# Patient Record
Sex: Female | Born: 1935 | Race: White | Hispanic: No | Marital: Married | Smoking: Never smoker
Health system: Southern US, Community
[De-identification: ages and names within clinical notes are randomized; demographics above are authoritative.]

## PROBLEM LIST (undated history)

## (undated) DIAGNOSIS — F419 Anxiety disorder, unspecified: Secondary | ICD-10-CM

## (undated) DIAGNOSIS — I10 Essential (primary) hypertension: Secondary | ICD-10-CM

## (undated) DIAGNOSIS — C50919 Malignant neoplasm of unspecified site of unspecified female breast: Secondary | ICD-10-CM

## (undated) DIAGNOSIS — E079 Disorder of thyroid, unspecified: Secondary | ICD-10-CM

## (undated) DIAGNOSIS — R42 Dizziness and giddiness: Secondary | ICD-10-CM

## (undated) DIAGNOSIS — C4491 Basal cell carcinoma of skin, unspecified: Secondary | ICD-10-CM

## (undated) DIAGNOSIS — E785 Hyperlipidemia, unspecified: Secondary | ICD-10-CM

## (undated) DIAGNOSIS — G4700 Insomnia, unspecified: Secondary | ICD-10-CM

## (undated) DIAGNOSIS — K648 Other hemorrhoids: Secondary | ICD-10-CM

## (undated) DIAGNOSIS — E119 Type 2 diabetes mellitus without complications: Secondary | ICD-10-CM

## (undated) DIAGNOSIS — K529 Noninfective gastroenteritis and colitis, unspecified: Secondary | ICD-10-CM

## (undated) DIAGNOSIS — M1990 Unspecified osteoarthritis, unspecified site: Secondary | ICD-10-CM

## (undated) DIAGNOSIS — K5790 Diverticulosis of intestine, part unspecified, without perforation or abscess without bleeding: Secondary | ICD-10-CM

## (undated) DIAGNOSIS — D649 Anemia, unspecified: Secondary | ICD-10-CM

## (undated) DIAGNOSIS — M549 Dorsalgia, unspecified: Secondary | ICD-10-CM

## (undated) DIAGNOSIS — N390 Urinary tract infection, site not specified: Secondary | ICD-10-CM

## (undated) DIAGNOSIS — G8929 Other chronic pain: Secondary | ICD-10-CM

## (undated) DIAGNOSIS — K219 Gastro-esophageal reflux disease without esophagitis: Secondary | ICD-10-CM

## (undated) HISTORY — PX: CATARACT EXTRACTION, BILATERAL: SHX1313

## (undated) HISTORY — PX: TRIGGER FINGER RELEASE: SHX641

## (undated) HISTORY — DX: Other hemorrhoids: K64.8

## (undated) HISTORY — DX: Urinary tract infection, site not specified: N39.0

## (undated) HISTORY — DX: Anxiety disorder, unspecified: F41.9

## (undated) HISTORY — DX: Diverticulosis of intestine, part unspecified, without perforation or abscess without bleeding: K57.90

## (undated) HISTORY — PX: CHOLECYSTECTOMY: SHX55

## (undated) HISTORY — DX: Other chronic pain: G89.29

## (undated) HISTORY — PX: TONSILLECTOMY: SUR1361

## (undated) HISTORY — DX: Dorsalgia, unspecified: M54.9

## (undated) HISTORY — PX: CARDIAC CATHETERIZATION: SHX172

## (undated) HISTORY — PX: ABDOMINAL HYSTERECTOMY: SHX81

## (undated) HISTORY — DX: Unspecified osteoarthritis, unspecified site: M19.90

## (undated) HISTORY — DX: Anemia, unspecified: D64.9

## (undated) HISTORY — DX: Type 2 diabetes mellitus without complications: E11.9

## (undated) HISTORY — DX: Hyperlipidemia, unspecified: E78.5

## (undated) HISTORY — DX: Dizziness and giddiness: R42

## (undated) HISTORY — PX: SHOULDER SURGERY: SHX246

## (undated) HISTORY — DX: Essential (primary) hypertension: I10

## (undated) HISTORY — DX: Insomnia, unspecified: G47.00

## (undated) HISTORY — DX: Disorder of thyroid, unspecified: E07.9

## (undated) HISTORY — DX: Basal cell carcinoma of skin, unspecified: C44.91

## (undated) HISTORY — DX: Malignant neoplasm of unspecified site of unspecified female breast: C50.919

---

## 2004-09-29 DIAGNOSIS — C50919 Malignant neoplasm of unspecified site of unspecified female breast: Secondary | ICD-10-CM

## 2004-09-29 HISTORY — DX: Malignant neoplasm of unspecified site of unspecified female breast: C50.919

## 2005-09-29 HISTORY — PX: MASTECTOMY, RADICAL: SHX710

## 2005-09-29 LAB — HM DEXA SCAN: HM Dexa Scan: NORMAL

## 2007-02-02 ENCOUNTER — Ambulatory Visit: Payer: Self-pay | Admitting: Cardiology

## 2007-02-04 ENCOUNTER — Inpatient Hospital Stay (HOSPITAL_BASED_OUTPATIENT_CLINIC_OR_DEPARTMENT_OTHER): Admission: RE | Admit: 2007-02-04 | Discharge: 2007-02-04 | Payer: Self-pay | Admitting: Orthopedic Surgery

## 2007-02-04 ENCOUNTER — Ambulatory Visit: Payer: Self-pay | Admitting: Cardiology

## 2007-02-18 ENCOUNTER — Ambulatory Visit: Payer: Self-pay | Admitting: Cardiology

## 2011-02-11 NOTE — Cardiovascular Report (Signed)
[name] [name], [name] [name]                   ACCOUNT NO.:  [id number]   MEDICAL RECORD NO.:  [id number]          PATIENT TYPE:  OIB   LOCATION:  1966                         FACILITY:  [facility]   PHYSICIAN:  Arturo Morton. Riley Kill, MD, FACCDATE OF BIRTH:  Dec 07, 1935   DATE OF PROCEDURE:  02/04/2007  DATE OF DISCHARGE:                            CARDIAC CATHETERIZATION   INDICATIONS:  [name] is a pleasant 75 year old woman who has  discomfort in the very upper chest at the base the neck.  She does have  compression fractures of mid thoracic vertebral body of undetermined  age.  The current study was done because of her multiple risk factors  and strong family history.  She was referred for diagnostic  catheterization.   PROCEDURE:  1. Left heart catheterization.  2. Selective coronary arteriography.  3. Selective left ventriculography.   DESCRIPTION OF PROCEDURE:  The procedure was performed from the right  femoral artery using 4-French catheters.  She tolerated the procedure  without complication.  She was taken to the holding area in satisfactory  clinical condition.  I reviewed her angiographic studies with her  family.   HEMODYNAMIC DATA:  1. Central aortic pressure 147/70.  2. Left ventricular pressure 156/10.  3. No gradient on pullback across the aortic valve.   ANGIOGRAPHIC DATA:  1. The left main is free of critical disease.  2. The left anterior descending artery courses to the apex.  It      provides a major diagonal branch.  The diagonal branch is large and      bifurcating.  There is minor luminal irregularity in the diagonal      branch but no significant focal stenosis.  Just after the takeoff      of the diagonal branch there is a segmental area of mild plaquing      that would measure about 20-30% in luminal reduction in the LAD.      This does not appear to be hemodynamically significant.  3. The circumflex provides a bifurcating marginal branch which is more      of an  intermediate vessel.  The AV circumflex is relatively small      and provides a tiny distal marginal vessel.  4. The right coronary artery demonstrates perhaps 20% ostial tapering      but the RCA is otherwise smooth.  It provides a twin posterior      descending branch and a moderate size posterolateral branch.   Ventriculography in the RAO projection reveals vigorous global systolic  function without segmental wall motion abnormality.   CONCLUSIONS:  1. Normal overall left ventricular systolic function.  2. Mild ostial narrowing of the right coronary artery.  3. Mild irregularity of the mid left anterior descending artery.   DISPOSITION:  The patient will follow up Dr. Andee Lineman and Dr. Dimas Aguas in  [place].  At the present time no further therapy other than risk factor  reduction is indicated for her coronary arteries.      Arturo Morton. Riley Kill, MD, [facility]     TDS/MEDQ  D:  02/04/2007  T:  02/04/2007  Job:  [id number]   cc:   Marcellina Millin, MD,FACC  [facility]

## 2011-02-11 NOTE — Assessment & Plan Note (Signed)
[facility]                          [facility]   [name] [name], [name] [name]                            MRN:          [id number]  DATE:02/18/2007                            DOB:          November 30, 1935    REFERRING PHYSICIAN:  Donzetta Sprung   HISTORY OF PRESENT ILLNESS:  The patient is a 75 year old, white female  with multiple current risk factors. The patient was referred for cardiac  catheterization due to complaints of chest pain. Catheterization  performed on Feb 04, 2007 revealed nonobstructive coronary artery  disease. There was some mild ostial narrowing of the right coronary  artery. There was also mild irregularities noted in the mid LAD. In the  interim, the patient also had carotid Dopplers done which essentially  were within normal limits. The patient states she has been doing well.  She has had no recurrent substernal chest pain. She does have ongoing  hypertension but she stated that she did not think that she took her  medications this morning.   MEDICATIONS:  1. Glucophage 500 b.i.d.  2. Labetalol 100 mg p.o. b.i.d.  3. Cymbalta 6 mg p.o. daily.  4. Hydrochlorothiazide 25 mg p.o. daily.  5. Glipizide ER 110 mg p.o. daily.  6. Enalapril 20 mg p.o. daily.  7. Lipitor 40 mg 1/2-tablet p.o. daily.  8. Byetta 10 mg p.o. b.i.d.   PHYSICAL EXAMINATION:  VITAL SIGNS:  Blood pressure 165/91, heart rate  98 beats per minute, he weighs 170 pounds.  NECK:  Normal carotid upstroke with right carotid bruit.  LUNGS:  Clear.  HEART:  Regular rate and rhythm. Normal S1, S2.  ABDOMEN:  Soft.  EXTREMITIES:  No cyanosis, clubbing or edema.   A 12-lead EKG normal sinus rhythm. Q waves in the inferior leads left  axis deviation. No acute ischemic changes.   PROBLEM LIST:  1. Chest discomfort.      a.     Nonobstructive coronary artery disease.      b.     Atypical features.  2. Multiple cardiac risk factors.      a.     Hypertension.  b.     Diabetes mellitus.      c.     Hyperlipidemia.      d.     Family history.  3. Right carotid bruit with essentially negative carotid Doppler.   PLAN:  1. The patient is doing well from a cardiovascular perspective. She      needs to have ongoing risk factor modification. This can be further      monitored by Dr. Reuel Boom.  2. I pointed out to the patient that her blood pressure is not      controlled but she assured me that she did not take her medications      this morning. I have asked her also to followup closely with Dr.      Reuel Boom on this.  3. At this point in time, no further cardiovascular workup is      required.     Learta Codding, MD,FACC  Electronically Signed    GED/MedQ  DD: 02/19/2007  DT: 02/19/2007  Job #: [id number]   cc:   Donzetta Sprung

## 2011-02-14 NOTE — Assessment & Plan Note (Signed)
[facility]                          [facility]   BLAKLEY, MICHNA                            MRN:          [id number]  DATE:02/02/2007                            DOB:          01-27-1936    REFERRING PHYSICIAN:  Donzetta Sprung   PRIMARY CARE PHYSICIAN:  Dr. Selinda Flavin.   REASON FOR CONSULTATION:  [name] [name] is a very pleasant 75 year old female  with no prior cardiac history but with numerous cardiac risk factors,  now referred to Dr. Lewayne Bunting today, as an add-on patient for  evaluation of persistent neck discomfort, worrisome for anginal  equivalent.   Patient's cardiac risk factors are notable for hypertension, type 2  diabetes mellitus, hyperlipidemia, family history, and age.  She has  never smoked tobacco.  The patient presents with a several month history  of persistent neck discomfort described as a burning sensation and  oftentimes associated with a knot.  This is nearly always precipitated  by food and at times by swallowing liquids and then persists for the  remainder of the day.  It is unclear, however, if this is exacerbated by  activities (i.e., walking, household chores).  She has tried Prilosec  OTC, Tums, and Rolaids with some initial relief but with subsequent  recurrence of the symptoms, which would remain unabated throughout the  day.  In fact, she oftentimes has to sleep sitting up because of the  persistent discomfort when lying supine.   Patient states that she has never undergone a formal GI evaluation nor  has had an upper endoscopy.   The patient's symptoms are confined only to the base of the neck.  She  denies any anterior chest discomfort and also denies any radiation of  this discomfort to the jaw or upper extremities.  She also denies any  associated dyspnea, diaphoresis, or nausea/vomiting.   An electrocardiogram done earlier today in Dr. Rosann Auerbach office reveals  NSR at 82 beats per minute with  left axis deviation and no ischemic  changes.  This is unchanged from a previous study of 2005.   ALLERGIES:  No known drug allergies.   CURRENT MEDICATIONS:  1. Glucophage 500 q.i.d.  2. Labetalol 100 daily.  3. Cymbalta 60 nightly.  4. Hydrochlorothiazide 25 daily.  5. Glipizide ER 20 daily.  6. Lipitor 20 daily.  7. Byetta 10 b.i.d.  8. Enalapril 20 daily.  9. Calcium/vitamin D 600 daily.  10.Aspirin 81 daily.   PAST MEDICAL HISTORY:  1. Hypertension.  2. Type 2 diabetes mellitus.  3. Hyperlipidemia.  4. Obesity.   PAST SURGICAL HISTORY:  Status post cholecystectomy, hysterectomy, and  trigger finger surgery.   SOCIAL HISTORY:  Patient is married, has three children, and seven  grandchildren.  She has never smoked tobacco and denies alcohol use.   FAMILY HISTORY:  Father deceased at age 42, fatal myocardial infarction.  Mother deceased at age 61, fatal myocardial infarction.  Sister, age 49,  status post MI in her 23s.   REVIEW OF SYSTEMS:  Denies history of myocardial infarction or  congestive  heart failure, otherwise as noted per HPI.  The remaining  systems are negative.   PHYSICAL EXAMINATION:  VITAL SIGNS:  Blood pressure 162/80, pulse 60s  and regular.  GENERAL:  A 75 year old female, morbidly obese, sitting upright in no  distress.  HEENT:  Normocephalic and atraumatic.  NECK:  Palpable bilateral carotid pulses with high-pitched right carotid  bruit; no bruit on the left.  Unable to assess JVD secondary to neck  girth.  LUNGS:  Clear to auscultation in all fields.  HEART:  Regular rate and rhythm (S1 and S2).  No significant murmurs.  No rubs.  ABDOMEN:  Protuberant, nontender.  EXTREMITIES:  Palpable femoral pulses without bruits.  Minimally  palpable dorsalis pedis pulses with trace edema.  NEURO:  No focal deficits.   IMPRESSION:  1. Neck discomfort.      a.     Question anginal equivalent.  2. Multiple cardiac risk factors.      a.      Hypertension.      b.     Type 2 diabetes mellitus.      c.     Hyperlipidemia.      d.     Family history.      e.     Age.      f.     Right carotid bruit.   PLAN:  1. Recommend proceeding with diagnostic coronary angiography, to be      scheduled in our [facility] catheterization lab, for definitive exclusion      of significant underlying coronary artery disease as the etiology      for her possible anginal equivalent.  We will try to schedule this      within the next 1-2 days.  The risks/benefits of the procedure have      been discussed with the patient, who is agreeable to proceed, and      Dr. Andee Lineman concurs with plan.  2. Continue current medication regimen, which includes low dose      aspirin, and add p.r.n. nitroglycerin.  3. Schedule carotid Dopplers for further assessment of right carotid      bruit.      Gene Serpe, PA-C       Learta Codding, MD,FACC    GS/MedQ  DD: 02/02/2007  DT: 02/02/2007  Job #: [id number]                             [facility]                          [facility] NOTE   NAME:[name], [name]                            MRN:          [id number]  DATE:02/02/2007                            DOB:          1936-05-12    REFERRING PHYSICIAN:  Donzetta Sprung   PRIMARY CARE PHYSICIAN:  Dr. Selinda Flavin.   REASON FOR CONSULTATION:  [name] [name] is a very pleasant 75 year old female  with no prior cardiac history but with numerous cardiac risk factors,  now referred to Dr. Lewayne Bunting as an add-on patient for evaluation of  persistent neck discomfort, worrisome for anginal equivalent.   Patient's cardiac risk factors are notable for hypertension, type 2  diabetes mellitus, hyperlipidemia, family history, and age.  She has  never smoked tobacco.  The patient presents with a several month history  of persistent neck discomfort described as a burning sensation and  oftentimes associated with a knot.  This is nearly always precipitated  by food  and at times by swallowing liquids and then persists for the  remainder of the day.  It is unclear, however, if this is exacerbated by  activities (i.e., walking, household chores).  She has tried Prilosec  OTC, Tums, and Rolaids with some initial relief but with subsequent  recurrence of the symptoms, which would remain unabated throughout the  day.  In fact, she oftentimes has to sleep sitting up because of the  persistent discomfort when lying supine.   Patient states that she has never undergone a formal GI evaluation nor  has had an upper endoscopy.   The patient's symptoms are confined only to the base of the neck.  She  denies any anterior chest discomfort and also denies any radiation of  this discomfort to the jaw or upper extremities.  She also denies any  associated dyspnea, diaphoresis, or nausea/vomiting.   An electrocardiogram done earlier today in Dr. Rosann Auerbach office reveals  NSR at 82 beats per minute with left axis deviation and no ischemic  changes.  This is unchanged from a previous study of 2005.   ALLERGIES:  No known drug allergies.   CURRENT MEDICATIONS:  1. Glucophage 500 q.i.d.  2. Labetalol 100 daily.  3. Cymbalta 60 nightly.  4. Hydrochlorothiazide 25 daily.  5. Glipizide ER 20 daily.  6. Lipitor 20 daily.  7. Byetta 10 b.i.d.  8. Enalapril 20 daily.  9. Calcium/vitamin D 600 daily.  10.Aspirin 81 daily.   PAST MEDICAL HISTORY:  1. Hypertension.  2. Type 2 diabetes mellitus.  3. Hyperlipidemia.

## 2011-02-28 ENCOUNTER — Ambulatory Visit: Payer: Self-pay | Admitting: Internal Medicine

## 2011-05-18 LAB — HM MAMMOGRAPHY: HM Mammogram: NORMAL

## 2011-07-02 ENCOUNTER — Ambulatory Visit (INDEPENDENT_AMBULATORY_CARE_PROVIDER_SITE_OTHER): Payer: Medicare Other | Admitting: *Deleted

## 2011-07-02 DIAGNOSIS — Z23 Encounter for immunization: Secondary | ICD-10-CM

## 2011-09-03 ENCOUNTER — Encounter: Payer: Self-pay | Admitting: Internal Medicine

## 2011-09-03 ENCOUNTER — Ambulatory Visit (INDEPENDENT_AMBULATORY_CARE_PROVIDER_SITE_OTHER): Payer: Medicare Other | Admitting: Internal Medicine

## 2011-09-03 VITALS — BP 138/72 | HR 93 | Temp 97.8°F | Wt 162.0 lb

## 2011-09-03 DIAGNOSIS — N390 Urinary tract infection, site not specified: Secondary | ICD-10-CM

## 2011-09-03 DIAGNOSIS — G629 Polyneuropathy, unspecified: Secondary | ICD-10-CM | POA: Insufficient documentation

## 2011-09-03 DIAGNOSIS — G589 Mononeuropathy, unspecified: Secondary | ICD-10-CM

## 2011-09-03 DIAGNOSIS — M25551 Pain in right hip: Secondary | ICD-10-CM | POA: Insufficient documentation

## 2011-09-03 DIAGNOSIS — I10 Essential (primary) hypertension: Secondary | ICD-10-CM

## 2011-09-03 DIAGNOSIS — M25559 Pain in unspecified hip: Secondary | ICD-10-CM

## 2011-09-03 LAB — COMPREHENSIVE METABOLIC PANEL
Albumin: 4 g/dL (ref 3.5–5.2)
Alkaline Phosphatase: 83 U/L (ref 39–117)
BUN: 9 mg/dL (ref 6–23)
CO2: 28 mEq/L (ref 19–32)
GFR: 103.99 mL/min (ref >60.00)
Glucose, Bld: 100 mg/dL — ABNORMAL HIGH (ref 70–99)
Potassium: 3.7 mEq/L (ref 3.5–5.1)
Sodium: 136 mEq/L (ref 135–145)
Total Bilirubin: 0.6 mg/dL (ref 0.3–1.2)
Total Protein: 7.6 g/dL (ref 6.0–8.3)

## 2011-09-03 LAB — POCT URINALYSIS DIPSTICK
Ketones, UA: NEGATIVE
Leukocytes, UA: NEGATIVE
Nitrite, UA: NEGATIVE
Protein, UA: NEGATIVE
pH, UA: 7

## 2011-09-03 MED ORDER — MELOXICAM 15 MG PO TABS: 15.0000 mg | ORAL_TABLET | Freq: Every day | ORAL | Status: AC

## 2011-09-03 NOTE — Progress Notes (Signed)
Subjective:    Patient ID: Jillian Lynch, female    DOB: 08-01-1936, 75 y.o.   MRN: [id number]  HPI 75 year old female with a history of breast cancer status post chemotherapy, hypertension, and hyperlipidemia presents for followup. She reports that she is generally been doing well. She notes some burning in both of her feet which has gotten progressively worse over the last several years. She occasionally has some numbness and burning in her hands. She also occasionally has some weakness in her hands and will drop things.This is been attributed to side effects from the chemotherapy that she received. She has been taking Neurontin twice daily for this but she is unsure of her dose. She reports minimal improvement with this.   She has not been checking her blood pressure on a regular basis. However, she reports full compliance with her medications. She denies any chest pain, palpitations, or headache.  She has a chronic history of right hip pain. This was evaluated with plain film earlier this year showing some osteoarthritis. She took meloxicam initially with some improvement but has not taken this recently. She denies any weakness in her right leg. She denies any swelling in the joint. She denies any fever or chills.  Outpatient Encounter Prescriptions as of 09/03/2011  Medication Sig Dispense Refill  . meloxicam (MOBIC) 15 MG tablet Take 1 tablet (15 mg total) by mouth daily.  30 tablet  2  NOTE MEDICATION LIST NOT UP TO DATE, awaiting pt old records and list of meds.  Review of Systems  Constitutional: Negative for fever, chills, appetite change, fatigue and unexpected weight change.  HENT: Negative for ear pain, congestion, sore throat, trouble swallowing, neck pain, voice change and sinus pressure.   Eyes: Negative for visual disturbance.  Respiratory: Negative for cough, shortness of breath, wheezing and stridor.   Cardiovascular: Negative for chest pain, palpitations and leg swelling.    Gastrointestinal: Negative for nausea, vomiting, abdominal pain, diarrhea, constipation, blood in stool, abdominal distention and anal bleeding.  Genitourinary: Negative for dysuria and flank pain.  Musculoskeletal: Negative for myalgias, arthralgias and gait problem.  Skin: Negative for color change and rash.  Neurological: Positive for weakness and numbness (and burning bilateral lower extremities and hands). Negative for dizziness and headaches.  Hematological: Negative for adenopathy. Does not bruise/bleed easily.  Psychiatric/Behavioral: Negative for suicidal ideas, sleep disturbance and dysphoric mood. The patient is not nervous/anxious.    BP 138/72  Pulse 93  Temp(Src) 97.8 F (36.6 C) (Oral)  Wt 162 lb (73.483 kg)  SpO2 98%     Objective:   Physical Exam  Constitutional: She is oriented to person, place, and time. She appears well-developed and well-nourished. No distress.  HENT:  Head: Normocephalic and atraumatic.  Right Ear: External ear normal.  Left Ear: External ear normal.  Nose: Nose normal.  Mouth/Throat: Oropharynx is clear and moist. No oropharyngeal exudate.  Eyes: Conjunctivae are normal. Pupils are equal, round, and reactive to light. Right eye exhibits no discharge. Left eye exhibits no discharge. No scleral icterus.  Neck: Normal range of motion. Neck supple. No tracheal deviation present. No thyromegaly present.  Cardiovascular: Normal rate, regular rhythm, normal heart sounds and intact distal pulses.  Exam reveals no gallop and no friction rub.   No murmur heard. Pulmonary/Chest: Effort normal and breath sounds normal. No respiratory distress. She has no wheezes. She has no rales. She exhibits no tenderness.  Musculoskeletal: Normal range of motion. She exhibits no edema and no tenderness.  Lymphadenopathy:  She has no cervical adenopathy.  Neurological: She is alert and oriented to person, place, and time. No cranial nerve deficit. She exhibits normal  muscle tone. Coordination normal.       Monofilament normal bilateral feet x3 points  Skin: Skin is warm and dry. No rash noted. She is not diaphoretic. No erythema. No pallor.  Psychiatric: She has a normal mood and affect. Her behavior is normal. Judgment and thought content normal.          Assessment & Plan:  1. Neuropathy - will try increasing dose of Neurontin. She will call or return to clinic if symptoms are not improving. Otherwise, return to clinic in 6 months.  2. Hypertension - blood pressure has been well controlled on current medications. Patient needs to bring an updated medication list with her to clinic so that we can reconcile medicines. Will check renal function with labs today. Followup in 6 months.  3. Right hip pain - Secondary to osteoarthritis. Will continue meloxicam prn.

## 2011-09-05 ENCOUNTER — Telehealth: Payer: Self-pay | Admitting: *Deleted

## 2011-09-05 MED ORDER — ENALAPRIL MALEATE 10 MG PO TABS: 10.0000 mg | ORAL_TABLET | Freq: Every day | ORAL | Status: DC

## 2011-09-05 MED ORDER — AMITRIPTYLINE HCL 50 MG PO TABS: 50.0000 mg | ORAL_TABLET | Freq: Every day | ORAL | Status: DC

## 2011-09-05 MED ORDER — ALPRAZOLAM 0.5 MG PO TABS: 0.5000 mg | ORAL_TABLET | Freq: Three times a day (TID) | ORAL | Status: DC | PRN

## 2011-09-05 MED ORDER — ATORVASTATIN CALCIUM 20 MG PO TABS: 20.0000 mg | ORAL_TABLET | Freq: Every day | ORAL | Status: DC

## 2011-09-05 NOTE — Telephone Encounter (Signed)
Meds updated and Rf's called in

## 2011-09-24 ENCOUNTER — Encounter: Payer: Self-pay | Admitting: Internal Medicine

## 2011-09-25 ENCOUNTER — Encounter: Payer: Self-pay | Admitting: Internal Medicine

## 2011-10-13 ENCOUNTER — Telehealth: Payer: Self-pay | Admitting: Internal Medicine

## 2011-10-13 NOTE — Telephone Encounter (Signed)
Patient fell in shower hit area between thigh and butt and is stiff in that area want to know what to do.

## 2011-10-13 NOTE — Telephone Encounter (Signed)
Patient fell in shower and hit the area between her thigh and butt. She is complaining of stiffness in that area. Wants to know what should she do?

## 2011-10-13 NOTE — Telephone Encounter (Signed)
error 

## 2011-10-13 NOTE — Telephone Encounter (Signed)
I spoke w/pt - she slipped and landed on her left thigh & buttock. She reports that she can bear weight but "feels that the leg wants to give out" at times. Scheduled for OV tomorrow at 8 am for eval.

## 2011-10-14 ENCOUNTER — Encounter: Payer: Self-pay | Admitting: Internal Medicine

## 2011-10-14 ENCOUNTER — Ambulatory Visit (INDEPENDENT_AMBULATORY_CARE_PROVIDER_SITE_OTHER): Payer: Medicare Other | Admitting: Internal Medicine

## 2011-10-14 ENCOUNTER — Ambulatory Visit (INDEPENDENT_AMBULATORY_CARE_PROVIDER_SITE_OTHER)
Admission: RE | Admit: 2011-10-14 | Discharge: 2011-10-14 | Disposition: A | Discharge status: Home / Self Care | Payer: Medicare Other | Source: Ambulatory Visit | Attending: Internal Medicine | Admitting: Internal Medicine

## 2011-10-14 VITALS — BP 138/70 | HR 125 | Temp 98.3°F | Ht 60.0 in | Wt 165.0 lb

## 2011-10-14 DIAGNOSIS — M25552 Pain in left hip: Secondary | ICD-10-CM

## 2011-10-14 DIAGNOSIS — M25559 Pain in unspecified hip: Secondary | ICD-10-CM

## 2011-10-14 NOTE — Progress Notes (Signed)
Subjective:    Patient ID: Jillian Lynch, female    DOB: 02-19-1936, 76 y.o.   MRN: [id number]  HPI 76YO female with h/o HL and HTN presents for acute visit after fall at home while stepping out of shower yesterday.  She notes she slipped on wet floor and fell to ground hitting left hip on grate between shower and tile floor.  She denies any head injury, LOC during event. She was immediately able to bear weight. She notes pain and swelling in her left hip and buttock. She reports that her left leg occasionally feels that it will give way. She denies any abdominal pain, loss of continence of bowel or bladder.  Outpatient Encounter Prescriptions as of 10/14/2011  Medication Sig Dispense Refill  . ALPRAZolam (XANAX) 0.5 MG tablet Take 1 tablet (0.5 mg total) by mouth 3 (three) times daily as needed.  90 tablet  0  . amitriptyline (ELAVIL) 50 MG tablet Take 1 tablet (50 mg total) by mouth at bedtime.  90 tablet  1  . aspirin EC 81 MG tablet Take 81 mg by mouth daily.        Marland Kitchen atorvastatin (LIPITOR) 20 MG tablet Take 1 tablet (20 mg total) by mouth daily.  90 tablet  1  . Calcium Carbonate-Vitamin D (CALCIUM + D PO) Take by mouth 2 (two) times daily.        . enalapril (VASOTEC) 10 MG tablet Take 1 tablet (10 mg total) by mouth daily.  90 tablet  1  . fish oil-omega-3 fatty acids 1000 MG capsule Take 2 g by mouth daily.        . fluticasone (FLONASE) 50 MCG/ACT nasal spray Place 2 sprays into the nose daily.        Marland Kitchen gabapentin (NEURONTIN) 100 MG capsule Take 100 mg by mouth 2 (two) times daily.        Marland Kitchen letrozole (FEMARA) 2.5 MG tablet Take 2.5 mg by mouth daily.        Marland Kitchen levothyroxine (SYNTHROID, LEVOTHROID) 75 MCG tablet Take 75 mcg by mouth daily.        . Magnesium 250 MG TABS Take by mouth daily.        . meloxicam (MOBIC) 15 MG tablet Take 1 tablet (15 mg total) by mouth daily.  30 tablet  2  . mometasone (NASONEX) 50 MCG/ACT nasal spray Place 2 sprays into the nose daily.        Marland Kitchen omeprazole  (PRILOSEC) 20 MG capsule Take 20 mg by mouth daily.          Review of Systems  Constitutional: Negative for fever and chills.  Respiratory: Negative for shortness of breath.   Cardiovascular: Negative for chest pain and leg swelling.  Gastrointestinal: Negative for abdominal pain.  Musculoskeletal: Positive for myalgias and arthralgias.  Neurological: Negative for weakness and light-headedness.   BP 138/70  Pulse 125  Temp(Src) 98.3 F (36.8 C) (Oral)  Ht 5' (1.524 m)  Wt 165 lb (74.844 kg)  BMI 32.22 kg/m2  SpO2 97%     Objective:   Physical Exam  Constitutional: She appears well-developed and well-nourished. No distress.  HENT:  Head: Normocephalic and atraumatic.  Musculoskeletal: She exhibits edema and tenderness.       Left hip: She exhibits tenderness and swelling.       Legs: Skin: She is not diaphoretic.          Assessment & Plan:  1. Left hip pain - s/p fall.  Extensive bruising/swelling posterior left hip and buttock. Able to bear weight and full ROM left hip. Will get plain film pelvis to look for fracture, however low index of suspicion for this. Pt will use meloxicam as needed for pain and call if symptoms worsening.

## 2011-10-16 ENCOUNTER — Telehealth: Payer: Self-pay | Admitting: Internal Medicine

## 2011-10-16 MED ORDER — CYCLOBENZAPRINE HCL 5 MG PO TABS: 5.0000 mg | ORAL_TABLET | Freq: Three times a day (TID) | ORAL | Status: AC | PRN

## 2011-10-16 NOTE — Telephone Encounter (Signed)
Patient informed, RX sent in  

## 2011-10-16 NOTE — Telephone Encounter (Signed)
Patient is needing a muscle relaxer from where she fell in the shower. Leave a message if patient is not home.

## 2011-10-16 NOTE — Telephone Encounter (Signed)
Fine to call in Flexeril 5mg  po tid prn disp 30 no refill

## 2011-10-24 ENCOUNTER — Ambulatory Visit (INDEPENDENT_AMBULATORY_CARE_PROVIDER_SITE_OTHER): Payer: Medicare Other | Admitting: Internal Medicine

## 2011-10-24 ENCOUNTER — Encounter: Payer: Self-pay | Admitting: Internal Medicine

## 2011-10-24 ENCOUNTER — Ambulatory Visit: Payer: Self-pay | Admitting: Internal Medicine

## 2011-10-24 VITALS — BP 138/72 | HR 99 | Temp 97.7°F | Wt 164.0 lb

## 2011-10-24 DIAGNOSIS — S300XXA Contusion of lower back and pelvis, initial encounter: Secondary | ICD-10-CM

## 2011-10-24 DIAGNOSIS — R609 Edema, unspecified: Secondary | ICD-10-CM

## 2011-10-24 DIAGNOSIS — R269 Unspecified abnormalities of gait and mobility: Secondary | ICD-10-CM | POA: Insufficient documentation

## 2011-10-24 DIAGNOSIS — R600 Localized edema: Secondary | ICD-10-CM

## 2011-10-24 LAB — CBC WITH DIFFERENTIAL/PLATELET
Basophils Absolute: 0 10*3/uL (ref 0.0–0.1)
Eosinophils Relative: 1 % (ref 0–5)
HCT: 33.6 % — ABNORMAL LOW (ref 36.0–46.0)
Hemoglobin: 10.6 g/dL — ABNORMAL LOW (ref 12.0–15.0)
Lymphocytes Relative: 10 % — ABNORMAL LOW (ref 12–46)
MCV: 96.6 fL (ref 78.0–100.0)
Monocytes Absolute: 1 10*3/uL (ref 0.1–1.0)
Monocytes Relative: 8 % (ref 3–12)
Neutro Abs: 10.9 10*3/uL — ABNORMAL HIGH (ref 1.7–7.7)
RDW: 16.4 % — ABNORMAL HIGH (ref 11.5–15.5)
WBC: 13.4 10*3/uL — ABNORMAL HIGH (ref 4.0–10.5)

## 2011-10-24 LAB — COMPREHENSIVE METABOLIC PANEL
ALT: 37 U/L — ABNORMAL HIGH (ref 0–35)
AST: 36 U/L (ref 0–37)
BUN: 13 mg/dL (ref 6–23)
CO2: 25 mEq/L (ref 19–32)
Creat: 0.62 mg/dL (ref 0.50–1.10)
Total Bilirubin: 0.6 mg/dL (ref 0.3–1.2)

## 2011-10-24 LAB — PROTIME-INR
INR: 0.97 (ref <1.50)
Prothrombin Time: 13.3 seconds (ref 11.6–15.2)

## 2011-10-24 NOTE — Assessment & Plan Note (Signed)
Lower extremity edema secondary to extensive ecchymosis after fall. Given that edema has been persistent and she has some left calf pain will get doppler of the lower extremities for evaluation for DVT.

## 2011-10-24 NOTE — Progress Notes (Signed)
Subjective:    Patient ID: Jillian Lynch, female    DOB: [birth year], 75 y.o.   MRN: [id number]  HPI 76 year old female with history of hypertension presents for followup after recent fall in 10/13/2011. She was in her shower and fell backwards onto the threshold between her shower in her bathroom. She landed on her left hip. She was seen in the office on 10/14/2011 and exam was remarkable for extensive swelling in the area. Plain x-ray was normal. She reports that over the last week, she has developed extensive bruising in the area which extends down her left leg. She has also had some edema in her lower leg and pain in her left calf. She reports that she has been trying to keep her leg elevated with some improvement in the edema. She has not had any shortness of breath. She has not been sedentary. She notes that she has tried to be active and participate in her normal activities.  She is also concerned today after having this recent fall about some gait instability. She notes that her balance is poor. She has on occasion used a cane or walker. She does not have any focal numbness or weakness in her lower extremities. She does have chronic pain in her right hip. This is secondary to arthritis. She is interested in starting physical therapy to help with strength and balance training.  Outpatient Encounter Prescriptions as of 10/24/2011  Medication Sig Dispense Refill  . ALPRAZolam (XANAX) 0.5 MG tablet Take 1 tablet (0.5 mg total) by mouth 3 (three) times daily as needed.  90 tablet  0  . amitriptyline (ELAVIL) 50 MG tablet Take 1 tablet (50 mg total) by mouth at bedtime.  90 tablet  1  . aspirin EC 81 MG tablet Take 81 mg by mouth daily.        Marland Kitchen atorvastatin (LIPITOR) 20 MG tablet Take 1 tablet (20 mg total) by mouth daily.  90 tablet  1  . Calcium Carbonate-Vitamin D (CALCIUM + D PO) Take by mouth 2 (two) times daily.        . cyclobenzaprine (FLEXERIL) 5 MG tablet Take 1 tablet (5 mg total) by mouth 3  (three) times daily as needed for muscle spasms.  30 tablet  0  . enalapril (VASOTEC) 10 MG tablet Take 1 tablet (10 mg total) by mouth daily.  90 tablet  1  . fish oil-omega-3 fatty acids 1000 MG capsule Take 2 g by mouth daily.        . fluticasone (FLONASE) 50 MCG/ACT nasal spray Place 2 sprays into the nose daily.        Marland Kitchen gabapentin (NEURONTIN) 100 MG capsule Take 100 mg by mouth 2 (two) times daily.        Marland Kitchen letrozole (FEMARA) 2.5 MG tablet Take 2.5 mg by mouth daily.        Marland Kitchen levothyroxine (SYNTHROID, LEVOTHROID) 75 MCG tablet Take 75 mcg by mouth daily.        . Magnesium 250 MG TABS Take by mouth daily.        . meloxicam (MOBIC) 15 MG tablet Take 1 tablet (15 mg total) by mouth daily.  30 tablet  2  . mometasone (NASONEX) 50 MCG/ACT nasal spray Place 2 sprays into the nose daily.        Marland Kitchen omeprazole (PRILOSEC) 20 MG capsule Take 20 mg by mouth daily.          Review of Systems  Constitutional: Negative for fever,  chills and fatigue.  Respiratory: Negative for shortness of breath.   Cardiovascular: Positive for leg swelling. Negative for chest pain.  Musculoskeletal: Positive for myalgias, arthralgias and gait problem.  Skin: Positive for color change.  Neurological: Positive for weakness. Negative for numbness.   BP 138/72  Pulse 99  Temp(Src) 97.7 F (36.5 C) (Oral)  Wt 164 lb (74.39 kg)  SpO2 96%     Objective:   Physical Exam  Constitutional: She is oriented to person, place, and time. She appears well-developed and well-nourished. No distress.  HENT:  Head: Normocephalic and atraumatic.  Right Ear: External ear normal.  Left Ear: External ear normal.  Nose: Nose normal.  Mouth/Throat: Oropharynx is clear and moist. No oropharyngeal exudate.  Eyes: Conjunctivae are normal. Pupils are equal, round, and reactive to light. Right eye exhibits no discharge. Left eye exhibits no discharge. No scleral icterus.  Neck: Normal range of motion. Neck supple. No tracheal  deviation present. No thyromegaly present.  Cardiovascular: Normal rate, regular rhythm, normal heart sounds and intact distal pulses.  Exam reveals no gallop and no friction rub.   No murmur heard. Pulmonary/Chest: Effort normal and breath sounds normal. No respiratory distress. She has no wheezes. She has no rales. She exhibits no tenderness.  Musculoskeletal: Normal range of motion. She exhibits no edema and no tenderness.  Lymphadenopathy:    She has no cervical adenopathy.  Neurological: She is alert and oriented to person, place, and time. No cranial nerve deficit. She exhibits normal muscle tone. Coordination normal.  Skin: Skin is warm and dry. Bruising and ecchymosis noted. No rash noted. She is not diaphoretic. There is erythema. No pallor.     Psychiatric: She has a normal mood and affect. Her behavior is normal. Judgment and thought content normal.          Assessment & Plan:

## 2011-10-24 NOTE — Assessment & Plan Note (Signed)
Patient reports some recent unsteadiness in her gait. She is interested in physical therapy to help with strength training and balance. We'll set this up for her.

## 2011-10-24 NOTE — Assessment & Plan Note (Signed)
Patient fell on 10/12/2001 landing on a railing in her shower. She has extensive bruising over her left hip extending down her left leg. Given the persistence of her pain and edema, will get ultrasound looking for DVT today. We'll also check CBC and coags given the extent of the bruising.

## 2011-11-03 ENCOUNTER — Encounter: Payer: Self-pay | Admitting: Internal Medicine

## 2011-11-04 ENCOUNTER — Encounter: Payer: Self-pay | Admitting: Internal Medicine

## 2011-11-25 ENCOUNTER — Other Ambulatory Visit (INDEPENDENT_AMBULATORY_CARE_PROVIDER_SITE_OTHER): Payer: Medicare Other | Admitting: *Deleted

## 2011-11-25 DIAGNOSIS — D649 Anemia, unspecified: Secondary | ICD-10-CM

## 2011-11-25 LAB — CBC WITH DIFFERENTIAL/PLATELET
Basophils Absolute: 0 10*3/uL (ref 0.0–0.1)
Basophils Relative: 0.1 % (ref 0.0–3.0)
Eosinophils Absolute: 0.1 10*3/uL (ref 0.0–0.7)
HCT: 39.7 % (ref 36.0–46.0)
Hemoglobin: 13 g/dL (ref 12.0–15.0)
Lymphocytes Relative: 11.6 % — ABNORMAL LOW (ref 12.0–46.0)
Lymphs Abs: 1.1 10*3/uL (ref 0.7–4.0)
MCHC: 32.7 g/dL (ref 30.0–36.0)
MCV: 93.5 fl (ref 78.0–100.0)
Monocytes Absolute: 0.7 10*3/uL (ref 0.1–1.0)
Neutro Abs: 7.2 10*3/uL (ref 1.4–7.7)
RBC: 4.25 Mil/uL (ref 3.87–5.11)
RDW: 15 % — ABNORMAL HIGH (ref 11.5–14.6)

## 2011-11-28 ENCOUNTER — Encounter: Payer: Self-pay | Admitting: Internal Medicine

## 2012-02-04 ENCOUNTER — Other Ambulatory Visit: Payer: Self-pay | Admitting: Internal Medicine

## 2012-02-18 ENCOUNTER — Other Ambulatory Visit: Payer: Self-pay | Admitting: Internal Medicine

## 2012-03-08 ENCOUNTER — Other Ambulatory Visit: Payer: Self-pay | Admitting: Internal Medicine

## 2012-03-12 ENCOUNTER — Telehealth: Payer: Self-pay | Admitting: Internal Medicine

## 2012-03-12 NOTE — Telephone Encounter (Signed)
Cbc, cmp, lipids - yes fasting

## 2012-03-12 NOTE — Telephone Encounter (Signed)
Pt would like to know if she needs labs prior to her appointment on wed Please advise pt and she wanted to know if these need to be fasting

## 2012-03-17 ENCOUNTER — Ambulatory Visit (INDEPENDENT_AMBULATORY_CARE_PROVIDER_SITE_OTHER): Payer: Medicare Other | Admitting: Internal Medicine

## 2012-03-17 ENCOUNTER — Encounter: Payer: Self-pay | Admitting: Internal Medicine

## 2012-03-17 VITALS — BP 130/80 | HR 60 | Temp 98.7°F | Ht 60.0 in | Wt 159.0 lb

## 2012-03-17 DIAGNOSIS — E785 Hyperlipidemia, unspecified: Secondary | ICD-10-CM

## 2012-03-17 DIAGNOSIS — Z1211 Encounter for screening for malignant neoplasm of colon: Secondary | ICD-10-CM | POA: Insufficient documentation

## 2012-03-17 DIAGNOSIS — G629 Polyneuropathy, unspecified: Secondary | ICD-10-CM

## 2012-03-17 DIAGNOSIS — I10 Essential (primary) hypertension: Secondary | ICD-10-CM

## 2012-03-17 DIAGNOSIS — G589 Mononeuropathy, unspecified: Secondary | ICD-10-CM

## 2012-03-17 DIAGNOSIS — Z0000 Encounter for general adult medical examination without abnormal findings: Secondary | ICD-10-CM

## 2012-03-17 DIAGNOSIS — N390 Urinary tract infection, site not specified: Secondary | ICD-10-CM

## 2012-03-17 DIAGNOSIS — Z23 Encounter for immunization: Secondary | ICD-10-CM

## 2012-03-17 DIAGNOSIS — D649 Anemia, unspecified: Secondary | ICD-10-CM

## 2012-03-17 LAB — COMPREHENSIVE METABOLIC PANEL
BUN: 9 mg/dL (ref 6–23)
CO2: 26 mEq/L (ref 19–32)
Calcium: 9.2 mg/dL (ref 8.4–10.5)
Chloride: 101 mEq/L (ref 96–112)
Creatinine, Ser: 0.6 mg/dL (ref 0.4–1.2)
GFR: 103.83 mL/min (ref >60.00)

## 2012-03-17 LAB — CBC WITH DIFFERENTIAL/PLATELET
Basophils Absolute: 0 10*3/uL (ref 0.0–0.1)
Eosinophils Relative: 1 % (ref 0.0–5.0)
HCT: 43.2 % (ref 36.0–46.0)
Lymphs Abs: 1.5 10*3/uL (ref 0.7–4.0)
MCV: 88.8 fl (ref 78.0–100.0)
Monocytes Absolute: 0.6 10*3/uL (ref 0.1–1.0)
Platelets: 343 10*3/uL (ref 150.0–400.0)
RDW: 14.6 % (ref 11.5–14.6)

## 2012-03-17 LAB — POCT URINALYSIS DIPSTICK
Bilirubin, UA: NEGATIVE
Ketones, UA: NEGATIVE
pH, UA: 7

## 2012-03-17 LAB — LIPID PANEL
HDL: 44.5 mg/dL (ref >39.00)
Triglycerides: 171 mg/dL — ABNORMAL HIGH (ref 0.0–149.0)

## 2012-03-17 NOTE — Patient Instructions (Signed)
Vitamin D 1000-2000 units daily

## 2012-03-17 NOTE — Assessment & Plan Note (Signed)
BP well controlled today. Will send renal function and urine microalbumin with labs. Continue current medications.Follow up 6 months.

## 2012-03-17 NOTE — Assessment & Plan Note (Signed)
Symptoms poorly controlled with neurontin and amitriptyline.  Will try increasing neurontin to 200mg  po bid. If no improvement, will plan to increase amitriptyline to 75mg  qhs. Follow up 6 months and prn.

## 2012-03-17 NOTE — Assessment & Plan Note (Signed)
Physical exam normal today. Health maintenance UTD except for Tdap and pneumovax which were given today. Colonoscopy ordered. Mammogram due in Jan 2014.  Labs today including CBC, CMP, lipids. Follow up in 6 months and prn.

## 2012-03-17 NOTE — Addendum Note (Signed)
Addended by: Jobie Quaker on: 03/17/2012 02:41 PM   Modules accepted: Orders

## 2012-03-17 NOTE — Assessment & Plan Note (Signed)
Will check lipids and LFTs with labs today. Continue Crestor. Follow up 6 months and prn. 

## 2012-03-17 NOTE — Progress Notes (Signed)
Subjective:    Patient ID: Jillian Lynch, female    DOB: [birth year], 76 y.o.   MRN: [id number]  HPI The patient is here for annual Medicare wellness examination and management of other chronic and acute problems.   The risk factors are reflected in the social history.  The roster of all physicians providing medical care to patient - is listed in the Snapshot section of the chart.  Activities of daily living:  The patient is 100% independent in all ADLs: dressing, toileting, feeding as well as independent mobility  Home safety : The patient has smoke detectors in the home. They wear seatbelts.  There are no firearms at home. There is no violence in the home.   There is no risks for hepatitis, STDs or HIV. There is no history of blood transfusion. They have no travel history to infectious disease endemic areas of the world.  The patient has seen their dentist in the last six month (Dr.  Regino Schultze).  They have seen their eye doctor in the last year [facility]). No issues with hearing.  They do not  have excessive sun exposure. Discussed the need for sun protection: hats, long sleeves and use of sunscreen if there is significant sun exposure.   Diet: the importance of a healthy diet is discussed. They do have a healthy diet.  The benefits of regular aerobic exercise were discussed. She is very active. She walks several days per week.  Depression screen: there are no signs or vegative symptoms of depression- irritability, change in appetite, anhedonia, sadness/tearfullness.  Cognitive assessment: the patient manages all their financial and personal affairs and is actively engaged. They could relate day,date,year and events.  The following portions of the patient's history were reviewed and updated as appropriate: allergies, current medications, past family history, past medical history,  past surgical history, past social history  and problem list.  Visual acuity was not assessed per  patient preference since she has regular follow up with her ophthalmologist. Hearing and body mass index were assessed and reviewed.   During the course of the visit the patient was educated and counseled about appropriate screening and preventive services including : fall prevention , diabetes screening, nutrition counseling, colorectal cancer screening, and recommended immunizations.    Outpatient Encounter Prescriptions as of 03/17/2012  Medication Sig Dispense Refill  . ALPRAZolam (XANAX) 0.5 MG tablet Take 1 tablet (0.5 mg total) by mouth 3 (three) times daily as needed.  90 tablet  0  . amitriptyline (ELAVIL) 50 MG tablet Take 1 tablet (50 mg total) by mouth at bedtime.  90 tablet  1  . aspirin EC 81 MG tablet Take 81 mg by mouth daily.        Marland Lynch atorvastatin (LIPITOR) 20 MG tablet Take 1 tablet (20 mg total) by mouth daily.  90 tablet  1  . Calcium Carbonate-Vitamin D (CALCIUM + D PO) Take by mouth 2 (two) times daily.        . enalapril (VASOTEC) 10 MG tablet TAKE ONE TABLET BY MOUTH EVERY DAY  90 tablet  0  . fish oil-omega-3 fatty acids 1000 MG capsule Take 2 g by mouth daily.        . fluticasone (FLONASE) 50 MCG/ACT nasal spray Place 2 sprays into the nose daily.        Marland Lynch gabapentin (NEURONTIN) 100 MG capsule TAKE ONE CAPSULE BY MOUTH THREE TIMES DAILY AS NEEDED  90 capsule  3  . letrozole (FEMARA) 2.5 MG  tablet Take 2.5 mg by mouth daily.        Marland Lynch levothyroxine (SYNTHROID, LEVOTHROID) 75 MCG tablet TAKE ONE TABLET BY MOUTH EVERY DAY  90 tablet  3  . Magnesium 250 MG TABS Take by mouth daily.        . meloxicam (MOBIC) 15 MG tablet Take 1 tablet (15 mg total) by mouth daily.  30 tablet  2  . mometasone (NASONEX) 50 MCG/ACT nasal spray Place 2 sprays into the nose daily.        Marland Lynch omeprazole (PRILOSEC) 20 MG capsule Take 20 mg by mouth daily.        Marland Lynch pyridOXINE (VITAMIN B-6) 100 MG tablet Take 100 mg by mouth 2 (two) times daily.      . vitamin B-12 (CYANOCOBALAMIN) 500 MCG tablet Take  500 mcg by mouth daily.         Review of Systems  Constitutional: Negative for fever, chills, appetite change, fatigue and unexpected weight change.  HENT: Negative for hearing loss and tinnitus.   Respiratory: Negative for shortness of breath.   Cardiovascular: Negative for chest pain, palpitations and leg swelling.  Gastrointestinal: Negative for abdominal pain, constipation, abdominal distention and anal bleeding.  Genitourinary: Negative for dysuria, urgency and frequency.  Musculoskeletal: Negative for myalgias, back pain, joint swelling and gait problem.  Skin: Negative for color change, rash and wound.  Neurological: Negative for tremors, speech difficulty, weakness, numbness and headaches.  Hematological: Negative for adenopathy. Does not bruise/bleed easily.  Psychiatric/Behavioral: Positive for disturbed wake/sleep cycle. Negative for suicidal ideas, hallucinations, dysphoric mood and decreased concentration. The patient is nervous/anxious.    BP 130/80  Pulse 60  Temp 98.7 F (37.1 C) (Oral)  Ht 5' (1.524 m)  Wt 159 lb (72.122 kg)  BMI 31.05 kg/m2  SpO2 96%     Objective:   Physical Exam  Constitutional: She is oriented to person, place, and time. She appears well-developed and well-nourished. No distress.  HENT:  Head: Normocephalic and atraumatic.  Right Ear: External ear normal.  Left Ear: External ear normal.  Nose: Nose normal.  Mouth/Throat: Oropharynx is clear and moist. No oropharyngeal exudate.  Eyes: Conjunctivae are normal. Pupils are equal, round, and reactive to light. Right eye exhibits no discharge. Left eye exhibits no discharge. No scleral icterus.  Neck: Normal range of motion. Neck supple. No tracheal deviation present. No thyromegaly present.  Cardiovascular: Normal rate, regular rhythm, normal heart sounds and intact distal pulses.  Exam reveals no gallop and no friction rub.   No murmur heard. Pulmonary/Chest: Effort normal and breath sounds  normal. No accessory muscle usage. Not tachypneic. No respiratory distress. She has no decreased breath sounds. She has no wheezes. She has no rhonchi. She has no rales. She exhibits no tenderness. Right breast exhibits no inverted nipple, no mass, no nipple discharge, no skin change and no tenderness. Left breast exhibits skin change. Left breast exhibits no tenderness. Breasts are asymmetrical.    Abdominal: Soft. Bowel sounds are normal. She exhibits no distension and no mass. There is no tenderness. There is no guarding.  Musculoskeletal: Normal range of motion. She exhibits no edema and no tenderness.  Lymphadenopathy:    She has no cervical adenopathy.  Neurological: She is alert and oriented to person, place, and time. No cranial nerve deficit. She exhibits normal muscle tone. Coordination normal.  Skin: Skin is warm and dry. No rash noted. She is not diaphoretic. No erythema. No pallor.  Psychiatric: She has  a normal mood and affect. Her behavior is normal. Judgment and thought content normal.          Assessment & Plan:

## 2012-03-18 LAB — URINE CULTURE: Organism ID, Bacteria: NO GROWTH

## 2012-03-19 ENCOUNTER — Ambulatory Visit (INDEPENDENT_AMBULATORY_CARE_PROVIDER_SITE_OTHER): Payer: Medicare Other | Admitting: *Deleted

## 2012-03-19 DIAGNOSIS — N390 Urinary tract infection, site not specified: Secondary | ICD-10-CM

## 2012-03-19 LAB — POCT URINALYSIS DIPSTICK
Bilirubin, UA: NEGATIVE
Ketones, UA: NEGATIVE
Leukocytes, UA: NEGATIVE
Nitrite, UA: NEGATIVE
Protein, UA: NEGATIVE

## 2012-04-22 ENCOUNTER — Encounter: Payer: Medicare Other | Admitting: Internal Medicine

## 2012-05-13 ENCOUNTER — Other Ambulatory Visit: Payer: Self-pay | Admitting: Internal Medicine

## 2012-05-17 NOTE — Progress Notes (Signed)
  Subjective:    Patient ID: Jillian Lynch. [place], female    DOB: May 18, 1936, 76 y.o.   MRN: [id number]  HPI  Nurse only  Review of Systems     Objective:   Physical Exam        Assessment & Plan:

## 2012-05-20 ENCOUNTER — Other Ambulatory Visit: Payer: Self-pay | Admitting: Internal Medicine

## 2012-06-09 ENCOUNTER — Other Ambulatory Visit: Payer: Self-pay | Admitting: Internal Medicine

## 2012-07-20 ENCOUNTER — Ambulatory Visit (INDEPENDENT_AMBULATORY_CARE_PROVIDER_SITE_OTHER): Payer: Medicare Other

## 2012-07-20 DIAGNOSIS — Z23 Encounter for immunization: Secondary | ICD-10-CM

## 2012-09-30 ENCOUNTER — Ambulatory Visit: Payer: Medicare Other | Admitting: Internal Medicine

## 2012-10-04 ENCOUNTER — Encounter: Payer: Self-pay | Admitting: Internal Medicine

## 2012-10-04 ENCOUNTER — Ambulatory Visit (INDEPENDENT_AMBULATORY_CARE_PROVIDER_SITE_OTHER): Payer: Medicare PPO | Admitting: Internal Medicine

## 2012-10-04 VITALS — BP 140/82 | HR 90 | Temp 98.1°F | Ht 60.0 in | Wt 158.8 lb

## 2012-10-04 DIAGNOSIS — F419 Anxiety disorder, unspecified: Secondary | ICD-10-CM | POA: Insufficient documentation

## 2012-10-04 DIAGNOSIS — I10 Essential (primary) hypertension: Secondary | ICD-10-CM

## 2012-10-04 DIAGNOSIS — Z634 Disappearance and death of family member: Secondary | ICD-10-CM | POA: Insufficient documentation

## 2012-10-04 DIAGNOSIS — E785 Hyperlipidemia, unspecified: Secondary | ICD-10-CM

## 2012-10-04 DIAGNOSIS — E039 Hypothyroidism, unspecified: Secondary | ICD-10-CM | POA: Insufficient documentation

## 2012-10-04 DIAGNOSIS — F411 Generalized anxiety disorder: Secondary | ICD-10-CM

## 2012-10-04 DIAGNOSIS — D649 Anemia, unspecified: Secondary | ICD-10-CM

## 2012-10-04 MED ORDER — ALPRAZOLAM 0.5 MG PO TABS: 0.5000 mg | ORAL_TABLET | Freq: Three times a day (TID) | ORAL | Status: DC | PRN

## 2012-10-04 NOTE — Progress Notes (Signed)
Subjective:    Patient ID: Jillian Lynch. [place], female    DOB: November 06, 1935, 77 y.o.   MRN: [id number]  HPI 77 year old female with history of hypertension, hyper lipidemia, hypothyroidism presents for followup. She reports that last month her son died unexpectedly. She reports that he had gone fishing alone [place]. There still waiting on the final cause of death from the corner. This is been a difficult time for her and her family. She has not sought counseling and feels that she has excellent support from family and friends. She occasionally has been taking Xanax to help with anxiety. She reports that this works well for her. Aside from this, she reports that things are going well. She reports compliance with her medication. She denies any new concerns today.  Outpatient Encounter Prescriptions as of 10/04/2012  Medication Sig Dispense Refill  . ALPRAZolam (XANAX) 0.5 MG tablet Take 1 tablet (0.5 mg total) by mouth 3 (three) times daily as needed for anxiety.  90 tablet  3  . amitriptyline (ELAVIL) 50 MG tablet TAKE ONE TABLET BY MOUTH AT BEDTIME  90 tablet  3  . aspirin EC 81 MG tablet Take 81 mg by mouth daily.        Marland Kitchen atorvastatin (LIPITOR) 20 MG tablet TAKE ONE TABLET BY MOUTH EVERY DAY  90 tablet  3  . enalapril (VASOTEC) 10 MG tablet TAKE ONE TABLET BY MOUTH EVERY DAY  90 tablet  3  . fish oil-omega-3 fatty acids 1000 MG capsule Take 2 g by mouth daily.        . fluticasone (FLONASE) 50 MCG/ACT nasal spray Place 2 sprays into the nose daily.        Marland Kitchen gabapentin (NEURONTIN) 300 MG capsule Take 300 mg by mouth 2 (two) times daily.      Marland Kitchen letrozole (FEMARA) 2.5 MG tablet Take 2.5 mg by mouth daily.        Marland Kitchen levothyroxine (SYNTHROID, LEVOTHROID) 75 MCG tablet TAKE ONE TABLET BY MOUTH EVERY DAY  90 tablet  3  . Magnesium 250 MG TABS Take by mouth daily.        . mometasone (NASONEX) 50 MCG/ACT nasal spray Place 2 sprays into the nose daily.        Marland Kitchen omeprazole  (PRILOSEC) 20 MG capsule Take 20 mg by mouth daily.        . [DISCONTINUED] ALPRAZolam (XANAX) 0.5 MG tablet Take 1 tablet (0.5 mg total) by mouth 3 (three) times daily as needed.  90 tablet  0  . Calcium Carbonate-Vitamin D (CALCIUM + D PO) Take by mouth 2 (two) times daily.        Marland Kitchen pyridOXINE (VITAMIN B-6) 100 MG tablet Take 100 mg by mouth 2 (two) times daily.      . vitamin B-12 (CYANOCOBALAMIN) 500 MCG tablet Take 500 mcg by mouth daily.      . [DISCONTINUED] gabapentin (NEURONTIN) 100 MG capsule TAKE ONE CAPSULE BY MOUTH THREE TIMES DAILY AS NEEDED  90 capsule  3   BP 140/82  Pulse 90  Temp 98.1 F (36.7 C) (Oral)  Ht 5' (1.524 m)  Wt 158 lb 12 oz (72.009 kg)  BMI 31.00 kg/m2  SpO2 98%  Review of Systems  Constitutional: Negative for fever, chills, appetite change, fatigue and unexpected weight change.  HENT: Negative for ear pain, congestion, sore throat, trouble swallowing, neck pain, voice change and sinus pressure.   Eyes: Negative for  visual disturbance.  Respiratory: Negative for cough, shortness of breath, wheezing and stridor.   Cardiovascular: Negative for chest pain, palpitations and leg swelling.  Gastrointestinal: Negative for nausea, vomiting, abdominal pain, diarrhea, constipation, blood in stool, abdominal distention and anal bleeding.  Genitourinary: Negative for dysuria and flank pain.  Musculoskeletal: Negative for myalgias, arthralgias and gait problem.  Skin: Negative for color change and rash.  Neurological: Negative for dizziness and headaches.  Hematological: Negative for adenopathy. Does not bruise/bleed easily.  Psychiatric/Behavioral: Positive for dysphoric mood. Negative for suicidal ideas and sleep disturbance. The patient is nervous/anxious.        Objective:   Physical Exam  Constitutional: She is oriented to person, place, and time. She appears well-developed and well-nourished. No distress.  HENT:  Head: Normocephalic and atraumatic.  Right  Ear: External ear normal.  Left Ear: External ear normal.  Nose: Nose normal.  Mouth/Throat: Oropharynx is clear and moist. No oropharyngeal exudate.  Eyes: Conjunctivae normal are normal. Pupils are equal, round, and reactive to light. Right eye exhibits no discharge. Left eye exhibits no discharge. No scleral icterus.  Neck: Normal range of motion. Neck supple. No tracheal deviation present. No thyromegaly present.  Cardiovascular: Normal rate, regular rhythm, normal heart sounds and intact distal pulses.  Exam reveals no gallop and no friction rub.   No murmur heard. Pulmonary/Chest: Effort normal and breath sounds normal. No respiratory distress. She has no wheezes. She has no rales. She exhibits no tenderness.  Musculoskeletal: Normal range of motion. She exhibits no edema and no tenderness.  Lymphadenopathy:    She has no cervical adenopathy.  Neurological: She is alert and oriented to person, place, and time. No cranial nerve deficit. She exhibits normal muscle tone. Coordination normal.  Skin: Skin is warm and dry. No rash noted. She is not diaphoretic. No erythema. No pallor.  Psychiatric: She has a normal mood and affect. Her behavior is normal. Judgment and thought content normal.          Assessment & Plan:

## 2012-10-04 NOTE — Assessment & Plan Note (Signed)
Symptoms have been well-controlled with intermittent Xanax. We'll continue.

## 2012-10-04 NOTE — Assessment & Plan Note (Signed)
Blood pressure has been generally well controlled on current medications. Will check renal function with labs today. Continue current medicines.

## 2012-10-04 NOTE — Assessment & Plan Note (Signed)
Patient's son died unexpectedly last month. Offered support today. Discussed referral for counseling. Patient will think about this. She will call should like to schedule.

## 2012-10-04 NOTE — Assessment & Plan Note (Signed)
Will check TSH with labs today. 

## 2012-10-05 ENCOUNTER — Telehealth: Payer: Self-pay | Admitting: Internal Medicine

## 2012-10-05 NOTE — Telephone Encounter (Signed)
Letter mailed advising patient as instructed. 

## 2012-10-05 NOTE — Telephone Encounter (Signed)
Labs including blood counts, kidney and liver function, thyroid function were normal 10/04/2012

## 2012-11-24 ENCOUNTER — Telehealth: Payer: Self-pay | Admitting: Internal Medicine

## 2012-11-24 NOTE — Telephone Encounter (Signed)
Jillian Lynch from [employer] is faxing a Clinical Alert regarding Jillian Lynch.

## 2012-11-26 ENCOUNTER — Telehealth: Payer: Self-pay | Admitting: Internal Medicine

## 2012-11-26 NOTE — Telephone Encounter (Signed)
Pt needs an appointment to discuss medication. Received note from her insurance about stopping Amitriptyline.

## 2012-11-26 NOTE — Telephone Encounter (Signed)
No, I have not received these forms

## 2012-11-26 NOTE — Telephone Encounter (Signed)
Have you received those forms?

## 2012-11-30 NOTE — Telephone Encounter (Signed)
Informed patient, appointment confirmed for Thursday 3/20 at 230

## 2012-11-30 NOTE — Telephone Encounter (Signed)
Form received in reference to patient medication. Patient has scheduled an appointment to come in to discuss.

## 2012-12-06 ENCOUNTER — Telehealth: Payer: Self-pay | Admitting: *Deleted

## 2012-12-06 MED ORDER — GABAPENTIN 300 MG PO CAPS: 300.0000 mg | ORAL_CAPSULE | Freq: Two times a day (BID) | ORAL | Status: DC

## 2012-12-07 NOTE — Telephone Encounter (Signed)
Script filled.

## 2012-12-07 NOTE — Telephone Encounter (Addendum)
Patient left message on voicemail stating she is returning your call to [facility]. It is 300 mg twice a day

## 2012-12-16 ENCOUNTER — Encounter: Payer: Self-pay | Admitting: Internal Medicine

## 2012-12-16 ENCOUNTER — Ambulatory Visit (INDEPENDENT_AMBULATORY_CARE_PROVIDER_SITE_OTHER): Payer: Medicare PPO | Admitting: Internal Medicine

## 2012-12-16 VITALS — BP 120/80 | HR 97 | Temp 98.2°F | Wt 158.0 lb

## 2012-12-16 DIAGNOSIS — B372 Candidiasis of skin and nail: Secondary | ICD-10-CM

## 2012-12-16 DIAGNOSIS — G4700 Insomnia, unspecified: Secondary | ICD-10-CM

## 2012-12-16 MED ORDER — AMITRIPTYLINE HCL 25 MG PO TABS: 25.0000 mg | ORAL_TABLET | Freq: Every day | ORAL | Status: DC

## 2012-12-16 NOTE — Assessment & Plan Note (Signed)
Rash in anterior fold of neck most consistent with candidiasis. Will treat with nystatin topically. Pt will call if no improvement.

## 2012-12-16 NOTE — Progress Notes (Signed)
Subjective:    Patient ID: Jillian Lynch. Keough, female    DOB: May 24, 1936, 77 y.o.   MRN: [id number]  HPI  77 year old female with history of breast cancer, hypertension, insomnia presents for followup. She recently received a note from her insurance company stating that it was unsafe for her to continue on Elavil at bedtime. She would like to taper off this medication. She has been on this medication for approximately 6 years for insomnia. She denies any side effects from the medicine. She has difficulty sleeping including difficulty staying asleep despite using the medicine.  She is also concerned about rash over her anterior neck. This is been present for a couple of days. It is described as red and itchy. She denies use of any new lotions or soaps. She has not applied anything to this rash.  Outpatient Encounter Prescriptions as of 12/16/2012  Medication Sig Dispense Refill  . ALPRAZolam (XANAX) 0.5 MG tablet Take 1 tablet (0.5 mg total) by mouth 3 (three) times daily as needed for anxiety.  90 tablet  3  . aspirin EC 81 MG tablet Take 81 mg by mouth daily.        Marland Kitchen atorvastatin (LIPITOR) 20 MG tablet TAKE ONE TABLET BY MOUTH EVERY DAY  90 tablet  3  . cholecalciferol (VITAMIN D) 1000 UNITS tablet Take 1,000 Units by mouth daily.      . enalapril (VASOTEC) 10 MG tablet TAKE ONE TABLET BY MOUTH EVERY DAY  90 tablet  3  . fish oil-omega-3 fatty acids 1000 MG capsule Take 2 g by mouth daily.        . fluticasone (FLONASE) 50 MCG/ACT nasal spray Place 2 sprays into the nose daily.        Marland Kitchen gabapentin (NEURONTIN) 300 MG capsule Take 1 capsule (300 mg total) by mouth 2 (two) times daily.  60 capsule  3  . letrozole (FEMARA) 2.5 MG tablet Take 2.5 mg by mouth daily.        Marland Kitchen levothyroxine (SYNTHROID, LEVOTHROID) 75 MCG tablet TAKE ONE TABLET BY MOUTH EVERY DAY  90 tablet  3  . loratadine (CLARITIN) 10 MG tablet Take 10 mg by mouth daily.      . Magnesium 250 MG TABS Take by mouth daily.        .  meloxicam (MOBIC) 15 MG tablet Take 15 mg by mouth daily.      Marland Kitchen omeprazole (PRILOSEC) 20 MG capsule Take 20 mg by mouth daily.        . [DISCONTINUED] amitriptyline (ELAVIL) 50 MG tablet TAKE ONE TABLET BY MOUTH AT BEDTIME  90 tablet  3  . amitriptyline (ELAVIL) 25 MG tablet Take 1 tablet (25 mg total) by mouth at bedtime.  90 tablet  3  . Calcium Carbonate-Vitamin D (CALCIUM + D PO) Take by mouth 2 (two) times daily.        . mometasone (NASONEX) 50 MCG/ACT nasal spray Place 2 sprays into the nose daily.        Marland Kitchen pyridOXINE (VITAMIN B-6) 100 MG tablet Take 100 mg by mouth 2 (two) times daily.      . vitamin B-12 (CYANOCOBALAMIN) 500 MCG tablet Take 500 mcg by mouth daily.       No facility-administered encounter medications on file as of 12/16/2012.   BP 120/80  Pulse 97  Temp(Src) 98.2 F (36.8 C) (Oral)  Wt 158 lb (71.668 kg)  BMI 30.86 kg/m2  SpO2 98%  Review of Systems  Constitutional:  Negative for fever, chills, appetite change, fatigue and unexpected weight change.  HENT: Negative for ear pain, congestion, sore throat, trouble swallowing, neck pain, voice change and sinus pressure.   Eyes: Negative for visual disturbance.  Respiratory: Negative for cough, shortness of breath, wheezing and stridor.   Cardiovascular: Negative for chest pain, palpitations and leg swelling.  Gastrointestinal: Negative for nausea, vomiting, abdominal pain, diarrhea, constipation, blood in stool, abdominal distention and anal bleeding.  Genitourinary: Negative for dysuria and flank pain.  Musculoskeletal: Negative for myalgias, arthralgias and gait problem.  Skin: Positive for rash. Negative for color change.  Neurological: Negative for dizziness and headaches.  Hematological: Negative for adenopathy. Does not bruise/bleed easily.  Psychiatric/Behavioral: Negative for suicidal ideas, sleep disturbance and dysphoric mood. The patient is not nervous/anxious.        Objective:   Physical Exam   Constitutional: She is oriented to person, place, and time. She appears well-developed and well-nourished. No distress.  HENT:  Head: Normocephalic.  Neck: Normal range of motion. Neck supple.  Pulmonary/Chest: Effort normal.  Neurological: She is alert and oriented to person, place, and time.  Skin: Skin is warm and dry. Rash (anterior neck) noted. She is not diaphoretic. There is erythema.  Psychiatric: She has a normal mood and affect. Her behavior is normal. Judgment and thought content normal.          Assessment & Plan:

## 2012-12-16 NOTE — Assessment & Plan Note (Signed)
Symptoms well controlled with Elavil, however discussed potential risks including oversedation. Will taper dose to 25mg  daily and have her follow up in 3 months. Plan to taper down to 10mg , then stop.

## 2013-01-17 ENCOUNTER — Encounter: Payer: Self-pay | Admitting: *Deleted

## 2013-01-17 ENCOUNTER — Telehealth: Payer: Self-pay | Admitting: Internal Medicine

## 2013-01-17 NOTE — Telephone Encounter (Signed)
Patient Information:  Caller Name: Yanisa  Phone: [phone]  Patient: [name], [name]  Gender: Female  DOB: [birth year]  Age: 77 Years  PCP: Ronna Polio (Adults only)  Office Follow Up:  Does the office need to follow up with this patient?: No  Instructions For The Office: N/A   Symptoms  Reason For Call & Symptoms: Pt states dizziness when bending forward or backward, small amount of sinus drainage.  Reviewed Health History In EMR: Yes  Reviewed Medications In EMR: Yes  Reviewed Allergies In EMR: Yes  Reviewed Surgeries / Procedures: Yes  Date of Onset of Symptoms: 01/13/2013  Guideline(s) Used:  Sinus Pain and Congestion  Disposition Per Guideline:   See Today in Office  Reason For Disposition Reached:   Earache  Advice Given:  Call Back If:   You become worse.  Patient Will Follow Care Advice:  YES  Appointment Scheduled:  01/18/2013 14:30:00 Appointment Scheduled Provider:  Dale Marion

## 2013-01-18 ENCOUNTER — Encounter: Payer: Self-pay | Admitting: Internal Medicine

## 2013-01-18 ENCOUNTER — Ambulatory Visit (INDEPENDENT_AMBULATORY_CARE_PROVIDER_SITE_OTHER): Payer: Medicare PPO | Admitting: Internal Medicine

## 2013-01-18 ENCOUNTER — Ambulatory Visit: Payer: Medicare HMO | Admitting: Internal Medicine

## 2013-01-18 VITALS — BP 150/80 | HR 95 | Temp 98.1°F | Ht 60.0 in | Wt 160.5 lb

## 2013-01-18 DIAGNOSIS — R42 Dizziness and giddiness: Secondary | ICD-10-CM

## 2013-01-18 DIAGNOSIS — I10 Essential (primary) hypertension: Secondary | ICD-10-CM

## 2013-01-18 MED ORDER — AMITRIPTYLINE HCL 10 MG PO TABS: ORAL_TABLET | ORAL | Status: DC

## 2013-01-18 MED ORDER — FLUTICASONE PROPIONATE 50 MCG/ACT NA SUSP: 2.0000 | Freq: Every day | NASAL | Status: DC

## 2013-01-19 ENCOUNTER — Encounter: Payer: Self-pay | Admitting: Internal Medicine

## 2013-01-19 NOTE — Assessment & Plan Note (Signed)
Blood pressure a little elevated today.  Have her spot check her pressure and record.  Will need to follow up to confirm improving.

## 2013-01-19 NOTE — Progress Notes (Signed)
Subjective:    Patient ID: Jillian Lynch. [name], female    DOB: 23-Sep-1936, 77 y.o.   MRN: [id number]  HPI 77 year old female with past history of hypertension, hypothyroidism and hypercholesterolemia who comes in today as a work in with concerns regarding dizziness.  States dizziness started last week.  Would notice when she looked up or down.  Rolling over in bed - aggravate some.  Head feels full.  Has a history of ringing in her ears, but states has noticed the ringing is worse now.  Notices the ringing more in her left ear.  No headache.  Some post nasal drainage, but she states this is not a new problem.  She has noticed some nasal congestion.  "blowing some congestion".  Clear mucus.  No fever.  Dizziness is better today, but she is still having some head fullness.    She was also questioning whether or not decreasing the amitriptyline could be contributing to her symptoms.  States she noticed the dizziness after cutting her 50mg  tablets in half.  She is also concerned because she is not able to cut the tablets into equal portions.    Past Medical History  Diagnosis Date  . HTN (hypertension)   . Hyperlipidemia   . Anxiety   . Herpes zoster     Left arm 2007  . Breast cancer 2006    Dr Elna Breslow, left breast, had chemo,mastectomy and radiation  . Basal cell carcinoma     Left eyebrow     Current Outpatient Prescriptions on File Prior to Visit  Medication Sig Dispense Refill  . ALPRAZolam (XANAX) 0.5 MG tablet Take 1 tablet (0.5 mg total) by mouth 3 (three) times daily as needed for anxiety.  90 tablet  3  . aspirin EC 81 MG tablet Take 81 mg by mouth daily.        Marland Kitchen atorvastatin (LIPITOR) 20 MG tablet TAKE ONE TABLET BY MOUTH EVERY DAY  90 tablet  3  . cholecalciferol (VITAMIN D) 1000 UNITS tablet Take 1,000 Units by mouth daily.      . enalapril (VASOTEC) 10 MG tablet TAKE ONE TABLET BY MOUTH EVERY DAY  90 tablet  3  . fish oil-omega-3 fatty acids 1000 MG capsule Take 2 g by mouth  daily.        Marland Kitchen letrozole (FEMARA) 2.5 MG tablet Take 2.5 mg by mouth daily.        Marland Kitchen levothyroxine (SYNTHROID, LEVOTHROID) 75 MCG tablet TAKE ONE TABLET BY MOUTH EVERY DAY  90 tablet  3  . loratadine (CLARITIN) 10 MG tablet Take 10 mg by mouth daily.      . Magnesium 250 MG TABS Take by mouth every other day.       . meloxicam (MOBIC) 15 MG tablet Take 15 mg by mouth as needed.       Marland Kitchen omeprazole (PRILOSEC) 20 MG capsule Take 20 mg by mouth daily.        . Calcium Carbonate-Vitamin D (CALCIUM + D PO) Take by mouth 2 (two) times daily.        Marland Kitchen pyridOXINE (VITAMIN B-6) 100 MG tablet Take 100 mg by mouth 2 (two) times daily.      . vitamin B-12 (CYANOCOBALAMIN) 500 MCG tablet Take 500 mcg by mouth daily.       No current facility-administered medications on file prior to visit.    Review of Systems Patient denies headache.  Some head fullness as outlined.  Dizziness  as outlined.  Aggravate by movement and head position changes.  Some nasal congestion.  Clear.  No fever.  Started using flonase and does feel better today.  States she has not noticed the dizziness today, but does report the persistent head fullness.   No chest pain, tightness or palpitations.  No increased shortness of breath, cough or congestion.  No nausea or vomiting.  No diarrhea.  Has the ringing in her ears as outlined.  Worse now.  Concern regarding decreasing the amitriptyline as outlined.      Objective:   Physical Exam Filed Vitals:   01/18/13 1444  BP: 150/80  Pulse: 95  Temp: 98.1 F (36.7 C)   Blood pressure recheck:  78-73/66  77 year old female in no acute distress.   HEENT:  Nares- clear.  Oropharynx - without lesions.  TMs visualized - without erythema.   Questionable minimal sinus pressure to palpation.  Reproducible symptoms with head position changes form left to right while lying down.   NECK:  Supple.  Nontender.  No audible bruit.  HEART:  Appears to be regular. LUNGS:  No crackles or wheezing  audible.  Respirations even and unlabored.  RADIAL PULSE:  Equal bilaterally.        Assessment & Plan:  ACUTE DIZZINESS.  Symptoms and exam as outlined.  Some reproducible symptoms on exam.  Also describes the ringing in her ears - which has worsened.  Question if positional vertigo. Given the new onset of dizziness, head fullness and worsening tinnitus - will have ENT evaluate to see if can confirm the diagnosis.  Question if Epley maneuvers would benefit.  Needs evaluation for the persistent/worsening tinnitus.  Continue Flonase nasal spray.  Hold on abx.  Hold on scanning at this time.  Explained to her if symptoms changed or worsened - she was to be reevaluated.    AMITRIPTYLINE TAPER.  She was questioning whether or not cutting back on her amitriptyline could be contributing to her symptoms.  She is also concerned because she cannot cut the tablets into equal portions.  Will give her amitriptyline 10mg  tablets.  Instructed her to take four tablets/day initially for the next couple of weeks.  If tolerating, can decrease to three tablets per day for a couple of weeks.  She is to call with an update and then can instruct her on a continued taper - if she is doing well.

## 2013-02-08 ENCOUNTER — Telehealth: Payer: Self-pay | Admitting: *Deleted

## 2013-02-08 ENCOUNTER — Other Ambulatory Visit: Payer: Self-pay | Admitting: Internal Medicine

## 2013-02-08 NOTE — Telephone Encounter (Signed)
That is fine, but pt will need TSH checked 1 month after change

## 2013-02-08 NOTE — Telephone Encounter (Signed)
Jillian Lynch from [facility] left a message asking if it was ok to switch her brand of Levothyroxine from [facility] to [facility]. They have to get permission from the doctor before switching since this is a therapeutic medication.

## 2013-02-08 NOTE — Telephone Encounter (Signed)
Spoke with patient, she has an appointment on July 10, would like to know if she could combine the 2 appointments into 1. Instead of coming in June for labs due to changing brands of Levothyroxine

## 2013-02-09 NOTE — Telephone Encounter (Signed)
Left detailed message on patient voicemail. 

## 2013-02-09 NOTE — Telephone Encounter (Signed)
Fine

## 2013-02-14 ENCOUNTER — Telehealth: Payer: Self-pay | Admitting: *Deleted

## 2013-02-14 NOTE — Telephone Encounter (Signed)
Needs appointment to discuss.

## 2013-02-14 NOTE — Telephone Encounter (Signed)
Called patient, she stated she saw Dr. Lorin Picket about a month ago. She informed her she was taking amitriptyline and she was suppose to taper down off of them. Taking 4 pills for 14 days then down to 3 for 14 days then call her when she getting ready to taper to next. She is now at the 3 a day, she is noticing she is having more trouble sleeping other than that she is doing ok.

## 2013-02-15 NOTE — Telephone Encounter (Signed)
She is coming in next Friday, she would like to know what she should do. If she should continue taking 3 a day or should she taper down to 2 a day until her visit next week? Please advise

## 2013-02-15 NOTE — Telephone Encounter (Signed)
Patient informed and verbally agreed.  

## 2013-02-15 NOTE — Telephone Encounter (Signed)
She can continue to take 3 times daily amitryptiline.

## 2013-02-25 ENCOUNTER — Ambulatory Visit (INDEPENDENT_AMBULATORY_CARE_PROVIDER_SITE_OTHER): Payer: Medicare PPO | Admitting: Internal Medicine

## 2013-02-25 ENCOUNTER — Encounter: Payer: Self-pay | Admitting: Internal Medicine

## 2013-02-25 VITALS — BP 148/76 | HR 92 | Temp 98.3°F | Wt 160.0 lb

## 2013-02-25 DIAGNOSIS — G4700 Insomnia, unspecified: Secondary | ICD-10-CM

## 2013-02-25 MED ORDER — AMITRIPTYLINE HCL 10 MG PO TABS: 30.0000 mg | ORAL_TABLET | Freq: Every day | ORAL | Status: DC

## 2013-02-25 MED ORDER — CETIRIZINE HCL 10 MG PO TABS: 10.0000 mg | ORAL_TABLET | Freq: Every day | ORAL | Status: DC

## 2013-02-25 NOTE — Assessment & Plan Note (Signed)
Will taper Elavil down to 20mg  nightly x 4 weeks. If tolerated well, then will plan to taper to 10mg  nightly x4 weeks then stop.

## 2013-02-25 NOTE — Patient Instructions (Signed)
Reduce dose of amitriptyline to 20mg  at bedtime for 4 weeks. If tolerated well, then reduce to 10mg  at bedtime for four weeks, then stop.

## 2013-02-25 NOTE — Progress Notes (Signed)
Subjective:    Patient ID: Jillian Lynch, female    DOB: [birth year], 77 y.o.   MRN: [id number]  HPI 77 year old female with history of breast cancer, hyperlipidemia, hypertension, neuropathy, insomnia presents for followup. She has recently been trying to taper down on her amitriptyline. She has been taking 30 mg daily and has been tolerating this well for the last several weeks. She would like to taper further possible. She typically has some trouble falling asleep after she tapers her dose but is able to maintain sleep throughout the night. No other concerns today.  Outpatient Encounter Prescriptions as of 02/25/2013  Medication Sig Dispense Refill  . ALPRAZolam (XANAX) 0.5 MG tablet Take 1 tablet (0.5 mg total) by mouth 3 (three) times daily as needed for anxiety.  90 tablet  3  . amitriptyline (ELAVIL) 10 MG tablet Take 3 tablets (30 mg total) by mouth at bedtime.  120 tablet  1  . aspirin EC 81 MG tablet Take 81 mg by mouth daily.        Marland Kitchen atorvastatin (LIPITOR) 20 MG tablet TAKE ONE TABLET BY MOUTH EVERY DAY  90 tablet  3  . cholecalciferol (VITAMIN D) 1000 UNITS tablet Take 1,000 Units by mouth daily.      . enalapril (VASOTEC) 10 MG tablet TAKE ONE TABLET BY MOUTH EVERY DAY  90 tablet  3  . fish oil-omega-3 fatty acids 1000 MG capsule Take 2 g by mouth daily.        . fluticasone (FLONASE) 50 MCG/ACT nasal spray Place 2 sprays into the nose daily.  16 g  3  . gabapentin (NEURONTIN) 300 MG capsule Take 300 mg by mouth 3 (three) times daily.      Marland Kitchen levothyroxine (SYNTHROID, LEVOTHROID) 75 MCG tablet TAKE ONE TABLET BY MOUTH EVERY DAY  90 tablet  0  . meloxicam (MOBIC) 15 MG tablet Take 15 mg by mouth as needed.       Marland Kitchen omeprazole (PRILOSEC) 20 MG capsule Take 20 mg by mouth daily.        . [DISCONTINUED] amitriptyline (ELAVIL) 10 MG tablet Take 4 tablets q day x 2 weeks and then 3 tablets x 2 weeks.  Call with update and will continue to taper.  100 tablet  1  . [DISCONTINUED] loratadine  (CLARITIN) 10 MG tablet Take 10 mg by mouth daily.      . cetirizine (ZYRTEC) 10 MG tablet Take 1 tablet (10 mg total) by mouth daily.  30 tablet  11  . letrozole (FEMARA) 2.5 MG tablet Take 2.5 mg by mouth daily.        Marland Kitchen pyridOXINE (VITAMIN B-6) 100 MG tablet Take 100 mg by mouth 2 (two) times daily.      . vitamin B-12 (CYANOCOBALAMIN) 500 MCG tablet Take 500 mcg by mouth daily.      . [DISCONTINUED] Calcium Carbonate-Vitamin D (CALCIUM + D PO) Take by mouth 2 (two) times daily.        . [DISCONTINUED] Magnesium 250 MG TABS Take by mouth every other day.        No facility-administered encounter medications on file as of 02/25/2013.   BP 148/76  Pulse 92  Temp(Src) 98.3 F (36.8 C) (Oral)  Wt 160 lb (72.576 kg)  BMI 31.25 kg/m2  SpO2 98%  Review of Systems  Constitutional: Negative for fever, chills, appetite change, fatigue and unexpected weight change.  HENT: Negative for ear pain, congestion, sore throat, trouble swallowing, neck pain, voice change  and sinus pressure.   Eyes: Negative for visual disturbance.  Respiratory: Negative for cough, shortness of breath, wheezing and stridor.   Cardiovascular: Negative for chest pain, palpitations and leg swelling.  Gastrointestinal: Negative for nausea, vomiting, abdominal pain, diarrhea, constipation, blood in stool, abdominal distention and anal bleeding.  Genitourinary: Negative for dysuria and flank pain.  Musculoskeletal: Negative for myalgias, arthralgias and gait problem.  Skin: Negative for color change and rash.  Neurological: Negative for dizziness and headaches.  Hematological: Negative for adenopathy. Does not bruise/bleed easily.  Psychiatric/Behavioral: Positive for sleep disturbance. Negative for suicidal ideas and dysphoric mood. The patient is not nervous/anxious.        Objective:   Physical Exam  Constitutional: She is oriented to person, place, and time. She appears well-developed and well-nourished. No distress.   HENT:  Head: Normocephalic and atraumatic.  Right Ear: External ear normal.  Left Ear: External ear normal.  Nose: Nose normal.  Mouth/Throat: Oropharynx is clear and moist. No oropharyngeal exudate.  Eyes: Conjunctivae are normal. Pupils are equal, round, and reactive to light. Right eye exhibits no discharge. Left eye exhibits no discharge. No scleral icterus.  Neck: Normal range of motion. Neck supple. No tracheal deviation present. No thyromegaly present.  Cardiovascular: Normal rate, regular rhythm, normal heart sounds and intact distal pulses.  Exam reveals no gallop and no friction rub.   No murmur heard. Pulmonary/Chest: Effort normal and breath sounds normal. No accessory muscle usage. Not tachypneic. No respiratory distress. She has no decreased breath sounds. She has no wheezes. She has no rhonchi. She has no rales. She exhibits no tenderness.  Musculoskeletal: Normal range of motion. She exhibits no edema and no tenderness.  Lymphadenopathy:    She has no cervical adenopathy.  Neurological: She is alert and oriented to person, place, and time. No cranial nerve deficit. She exhibits normal muscle tone. Coordination normal.  Skin: Skin is warm and dry. No rash noted. She is not diaphoretic. No erythema. No pallor.  Psychiatric: She has a normal mood and affect. Her behavior is normal. Judgment and thought content normal.          Assessment & Plan:

## 2013-04-07 ENCOUNTER — Encounter: Payer: Self-pay | Admitting: Internal Medicine

## 2013-04-07 ENCOUNTER — Ambulatory Visit (INDEPENDENT_AMBULATORY_CARE_PROVIDER_SITE_OTHER): Payer: Medicare PPO | Admitting: Internal Medicine

## 2013-04-07 VITALS — BP 138/78 | HR 78 | Temp 97.8°F | Ht <= 58 in | Wt 167.0 lb

## 2013-04-07 DIAGNOSIS — E785 Hyperlipidemia, unspecified: Secondary | ICD-10-CM

## 2013-04-07 DIAGNOSIS — I10 Essential (primary) hypertension: Secondary | ICD-10-CM

## 2013-04-07 DIAGNOSIS — E039 Hypothyroidism, unspecified: Secondary | ICD-10-CM

## 2013-04-07 DIAGNOSIS — Z0000 Encounter for general adult medical examination without abnormal findings: Secondary | ICD-10-CM

## 2013-04-07 LAB — COMPREHENSIVE METABOLIC PANEL
ALT: 23 U/L (ref 0–35)
BUN: 8 mg/dL (ref 6–23)
CO2: 33 mEq/L — ABNORMAL HIGH (ref 19–32)
Calcium: 9.2 mg/dL (ref 8.4–10.5)
Chloride: 97 mEq/L (ref 96–112)
Creatinine, Ser: 0.6 mg/dL (ref 0.4–1.2)
GFR: 114.47 mL/min (ref >60.00)
Glucose, Bld: 91 mg/dL (ref 70–99)
Total Bilirubin: 0.8 mg/dL (ref 0.3–1.2)

## 2013-04-07 LAB — CBC WITH DIFFERENTIAL/PLATELET
Basophils Absolute: 0 10*3/uL (ref 0.0–0.1)
Eosinophils Relative: 1.1 % (ref 0.0–5.0)
HCT: 44.2 % (ref 36.0–46.0)
Hemoglobin: 14.7 g/dL (ref 12.0–15.0)
Lymphocytes Relative: 14.3 % (ref 12.0–46.0)
Lymphs Abs: 1.5 10*3/uL (ref 0.7–4.0)
Monocytes Relative: 7.5 % (ref 3.0–12.0)
Neutro Abs: 8.3 10*3/uL — ABNORMAL HIGH (ref 1.4–7.7)
RBC: 4.89 Mil/uL (ref 3.87–5.11)
RDW: 14 % (ref 11.5–14.6)
WBC: 10.8 10*3/uL — ABNORMAL HIGH (ref 4.5–10.5)

## 2013-04-07 LAB — LIPID PANEL
HDL: 43.5 mg/dL (ref >39.00)
LDL Cholesterol: 104 mg/dL — ABNORMAL HIGH (ref 0–99)
VLDL: 34.2 mg/dL (ref 0.0–40.0)

## 2013-04-07 LAB — MICROALBUMIN / CREATININE URINE RATIO
Microalb Creat Ratio: 1.3 mg/g (ref 0.0–30.0)
Microalb, Ur: 0.3 mg/dL (ref 0.0–1.9)

## 2013-04-07 LAB — HM COLONOSCOPY

## 2013-04-07 MED ORDER — ALIGN 4 MG PO CAPS: 4.0000 mg | ORAL_CAPSULE | Freq: Every day | ORAL | Status: DC

## 2013-04-07 NOTE — Assessment & Plan Note (Signed)
General medical exam including breast exam normal today. Health maintenance is up to date except for colonoscopy which has been ordered and is pending. Labs today including CBC, CMP, lipids are stable. Plan to followup in 6 months and as needed.

## 2013-04-07 NOTE — Assessment & Plan Note (Signed)
Lipids well controlled on Atorvastatin. Will continue. 

## 2013-04-07 NOTE — Patient Instructions (Signed)
Vanicream

## 2013-04-07 NOTE — Assessment & Plan Note (Signed)
TSH normal. Will continue current dose of Levothyroxine.

## 2013-04-07 NOTE — Progress Notes (Signed)
Subjective:    Patient ID: Jillian Lynch, female    DOB: [birth year], 77 y.o.   MRN: [id number]  HPI The patient is here for annual Medicare wellness examination and management of other chronic and acute problems.   The risk factors are reflected in the social history.  The roster of all physicians providing medical care to patient - is listed in the Snapshot section of the chart.  Activities of daily living:  The patient is 100% independent in all ADLs: dressing, toileting, feeding as well as independent mobility  Home safety : The patient has smoke detectors in the home. They wear seatbelts.  There are no firearms at home. There is no violence in the home.   There is no risks for hepatitis, STDs or HIV. There is no history of blood transfusion. They have no travel history to infectious disease endemic areas of the world.  The patient has seen their dentist in the last six month (Dr.  Regino Schultze).  They have seen their eye doctor in the last year [facility]). No issues with hearing.  They do not  have excessive sun exposure. Discussed the need for sun protection: hats, long sleeves and use of sunscreen if there is significant sun exposure. Derm - Dr. Gwen Pounds  Diet: the importance of a healthy diet is discussed. They do have a healthy diet.  The benefits of regular aerobic exercise were discussed. She is very active. She walks several days per week.  Depression screen: there are no signs or vegative symptoms of depression- irritability, change in appetite, anhedonia, sadness/tearfullness.  Cognitive assessment: the patient manages all their financial and personal affairs and is actively engaged. They could relate day,date,year and events.  The following portions of the patient's history were reviewed and updated as appropriate: allergies, current medications, past family history, past medical history,  past surgical history, past social history  and problem list.  Visual acuity was  not assessed per patient preference since she has regular follow up with her ophthalmologist. Hearing and body mass index were assessed and reviewed.   During the course of the visit the patient was educated and counseled about appropriate screening and preventive services including : fall prevention , diabetes screening, nutrition counseling, colorectal cancer screening, and recommended immunizations.    Outpatient Encounter Prescriptions as of 04/07/2013  Medication Sig Dispense Refill  . ALPRAZolam (XANAX) 0.5 MG tablet Take 1 tablet (0.5 mg total) by mouth 3 (three) times daily as needed for anxiety.  90 tablet  3  . amitriptyline (ELAVIL) 10 MG tablet Take 20 mg by mouth at bedtime.      Marland Kitchen aspirin EC 81 MG tablet Take 81 mg by mouth daily.        Marland Kitchen atorvastatin (LIPITOR) 20 MG tablet TAKE ONE TABLET BY MOUTH EVERY DAY  90 tablet  3  . cetirizine (ZYRTEC) 10 MG tablet Take 1 tablet (10 mg total) by mouth daily.  30 tablet  11  . cholecalciferol (VITAMIN D) 1000 UNITS tablet Take 1,000 Units by mouth daily.      . enalapril (VASOTEC) 10 MG tablet TAKE ONE TABLET BY MOUTH EVERY DAY  90 tablet  3  . fish oil-omega-3 fatty acids 1000 MG capsule Take 2 g by mouth daily.        . fluticasone (FLONASE) 50 MCG/ACT nasal spray Place 2 sprays into the nose daily.  16 g  3  . gabapentin (NEURONTIN) 300 MG capsule Take 300 mg by mouth  3 (three) times daily.      . Lactobacillus (ACIDOPHILUS) CAPS Take 1 capsule by mouth daily.      Marland Kitchen levothyroxine (SYNTHROID, LEVOTHROID) 75 MCG tablet TAKE ONE TABLET BY MOUTH EVERY DAY  90 tablet  0  . meloxicam (MOBIC) 15 MG tablet Take 15 mg by mouth as needed.       Marland Kitchen omeprazole (PRILOSEC) 20 MG capsule Take 20 mg by mouth daily.        Marland Kitchen pyridOXINE (VITAMIN B-6) 100 MG tablet Take 100 mg by mouth 2 (two) times daily.      . vitamin B-12 (CYANOCOBALAMIN) 500 MCG tablet Take 500 mcg by mouth daily.      . [DISCONTINUED] amitriptyline (ELAVIL) 10 MG tablet Take 3  tablets (30 mg total) by mouth at bedtime.  120 tablet  1  . letrozole (FEMARA) 2.5 MG tablet Take 2.5 mg by mouth daily.        . Probiotic Product (ALIGN) 4 MG CAPS Take 4 mg by mouth daily.  30 capsule  6   No facility-administered encounter medications on file as of 04/07/2013.   BP 138/78  Pulse 78  Temp(Src) 97.8 F (36.6 C) (Oral)  Ht 4\' 10"  (1.473 m)  Wt 167 lb (75.751 kg)  BMI 34.91 kg/m2  SpO2 98%   Review of Systems  Constitutional: Negative for fever, chills, appetite change, fatigue and unexpected weight change.  HENT: Negative for ear pain, congestion, sore throat, trouble swallowing, neck pain, voice change and sinus pressure.   Eyes: Negative for visual disturbance.  Respiratory: Negative for cough, shortness of breath, wheezing and stridor.   Cardiovascular: Negative for chest pain, palpitations and leg swelling.  Gastrointestinal: Negative for nausea, vomiting, abdominal pain, diarrhea, constipation, blood in stool, abdominal distention and anal bleeding.  Genitourinary: Negative for dysuria and flank pain.  Musculoskeletal: Negative for myalgias, arthralgias and gait problem.  Skin: Negative for color change and rash.  Neurological: Negative for dizziness and headaches.  Hematological: Negative for adenopathy. Does not bruise/bleed easily.  Psychiatric/Behavioral: Negative for suicidal ideas, sleep disturbance and dysphoric mood. The patient is not nervous/anxious.        Objective:   Physical Exam  Constitutional: She is oriented to person, place, and time. She appears well-developed and well-nourished. No distress.  HENT:  Head: Normocephalic and atraumatic.  Right Ear: External ear normal.  Left Ear: External ear normal.  Nose: Nose normal.  Mouth/Throat: Oropharynx is clear and moist. No oropharyngeal exudate.  Eyes: Conjunctivae are normal. Pupils are equal, round, and reactive to light. Right eye exhibits no discharge. Left eye exhibits no discharge.  No scleral icterus.  Neck: Normal range of motion. Neck supple. No tracheal deviation present. No thyromegaly present.  Cardiovascular: Normal rate, regular rhythm, normal heart sounds and intact distal pulses.  Exam reveals no gallop and no friction rub.   No murmur heard. Pulmonary/Chest: Effort normal and breath sounds normal. No accessory muscle usage. Not tachypneic. No respiratory distress. She has no decreased breath sounds. She has no wheezes. She has no rales. She exhibits no tenderness. Right breast exhibits no inverted nipple, no mass, no nipple discharge, no skin change and no tenderness. Left breast exhibits no inverted nipple, no mass, no nipple discharge, no skin change and no tenderness. Breasts are symmetrical.  Abdominal: Soft. Bowel sounds are normal. She exhibits no distension and no mass. There is no tenderness. There is no rebound and no guarding.  Musculoskeletal: Normal range of motion. She  exhibits no edema and no tenderness.  Lymphadenopathy:    She has no cervical adenopathy.  Neurological: She is alert and oriented to person, place, and time. No cranial nerve deficit. She exhibits normal muscle tone. Coordination normal.  Skin: Skin is warm and dry. No rash noted. She is not diaphoretic. No erythema. No pallor.  Psychiatric: She has a normal mood and affect. Her behavior is normal. Judgment and thought content normal.          Assessment & Plan:

## 2013-04-07 NOTE — Assessment & Plan Note (Signed)
BP Readings from Last 3 Encounters:  04/07/13 138/78  02/25/13 148/76  01/18/13 150/80   BP well controlled with enalapril. Will continue.

## 2013-04-08 ENCOUNTER — Encounter: Payer: Self-pay | Admitting: *Deleted

## 2013-05-09 ENCOUNTER — Other Ambulatory Visit: Payer: Self-pay | Admitting: Internal Medicine

## 2013-05-10 ENCOUNTER — Other Ambulatory Visit: Payer: Self-pay | Admitting: *Deleted

## 2013-05-11 MED ORDER — ATORVASTATIN CALCIUM 20 MG PO TABS: ORAL_TABLET | ORAL | Status: DC

## 2013-05-11 NOTE — Telephone Encounter (Signed)
Eprescribed.

## 2013-05-31 ENCOUNTER — Other Ambulatory Visit: Payer: Self-pay | Admitting: *Deleted

## 2013-05-31 MED ORDER — ENALAPRIL MALEATE 10 MG PO TABS: ORAL_TABLET | ORAL | Status: DC

## 2013-05-31 NOTE — Telephone Encounter (Signed)
Eprescribed.

## 2013-06-23 ENCOUNTER — Ambulatory Visit (INDEPENDENT_AMBULATORY_CARE_PROVIDER_SITE_OTHER): Payer: Medicare PPO | Admitting: *Deleted

## 2013-06-23 DIAGNOSIS — Z23 Encounter for immunization: Secondary | ICD-10-CM

## 2013-07-14 ENCOUNTER — Telehealth: Payer: Self-pay | Admitting: *Deleted

## 2013-07-14 NOTE — Telephone Encounter (Signed)
Please fax form back to pharmacy & notify them that she has not been seen at this office

## 2013-07-14 NOTE — Telephone Encounter (Signed)
Refill Request  Meloxicam 15 mg tab  #30   Take one tablet by mouth every day

## 2013-07-25 ENCOUNTER — Telehealth: Payer: Self-pay | Admitting: Emergency Medicine

## 2013-07-25 NOTE — Telephone Encounter (Signed)
Pt called stating she has contact [facility] who told her they have faxed [place] multiple times w/in the past week and a half. Pt is needing refill on the mobic. Please advise.

## 2013-07-26 MED ORDER — MELOXICAM 15 MG PO TABS: 15.0000 mg | ORAL_TABLET | ORAL | Status: DC | PRN

## 2013-07-26 NOTE — Telephone Encounter (Signed)
Ok to refill Mobic? Spoke with patient informed her we never received a request from [facility] and she has not taken this since last year. I would ask Dr. Dan Humphreys if it is ok to refill this med and if approved it will be done by the end of the day today.

## 2013-07-26 NOTE — Telephone Encounter (Signed)
Rx sent to the pharmacy and patient aware to check with her pharmacy

## 2013-07-26 NOTE — Telephone Encounter (Signed)
Fine to refill 

## 2013-08-02 ENCOUNTER — Encounter: Payer: Self-pay | Admitting: Internal Medicine

## 2013-10-04 ENCOUNTER — Other Ambulatory Visit: Payer: Self-pay | Admitting: *Deleted

## 2013-10-04 MED ORDER — GABAPENTIN 300 MG PO CAPS: 300.0000 mg | ORAL_CAPSULE | Freq: Three times a day (TID) | ORAL | Status: DC

## 2013-10-04 NOTE — Telephone Encounter (Signed)
Patient would like a refill on Gabapentin, she been getting this prescribed through [facility]. But would like all her prescriptions to come from the same provider. Gabapentin 300 mg TID

## 2013-10-05 ENCOUNTER — Ambulatory Visit (AMBULATORY_SURGERY_CENTER): Payer: Medicare PPO | Admitting: *Deleted

## 2013-10-05 VITALS — Ht 59.0 in | Wt 162.0 lb

## 2013-10-05 DIAGNOSIS — Z1211 Encounter for screening for malignant neoplasm of colon: Secondary | ICD-10-CM

## 2013-10-05 MED ORDER — MOVIPREP 100 G PO SOLR: ORAL | Status: DC

## 2013-10-05 NOTE — Progress Notes (Signed)
Patient denies any allergies to eggs or soy. Patient denies any problems with anesthesia.  

## 2013-10-11 ENCOUNTER — Encounter (INDEPENDENT_AMBULATORY_CARE_PROVIDER_SITE_OTHER): Payer: Self-pay

## 2013-10-11 ENCOUNTER — Encounter: Payer: Self-pay | Admitting: Internal Medicine

## 2013-10-11 ENCOUNTER — Encounter: Payer: Self-pay | Admitting: *Deleted

## 2013-10-11 ENCOUNTER — Ambulatory Visit (INDEPENDENT_AMBULATORY_CARE_PROVIDER_SITE_OTHER): Payer: Medicare PPO | Admitting: Internal Medicine

## 2013-10-11 VITALS — BP 144/72 | HR 74 | Temp 97.5°F | Wt 162.0 lb

## 2013-10-11 DIAGNOSIS — E785 Hyperlipidemia, unspecified: Secondary | ICD-10-CM

## 2013-10-11 DIAGNOSIS — I10 Essential (primary) hypertension: Secondary | ICD-10-CM

## 2013-10-11 DIAGNOSIS — F411 Generalized anxiety disorder: Secondary | ICD-10-CM

## 2013-10-11 DIAGNOSIS — F4323 Adjustment disorder with mixed anxiety and depressed mood: Secondary | ICD-10-CM

## 2013-10-11 DIAGNOSIS — F419 Anxiety disorder, unspecified: Secondary | ICD-10-CM

## 2013-10-11 LAB — COMPREHENSIVE METABOLIC PANEL
ALBUMIN: 3.8 g/dL (ref 3.5–5.2)
ALK PHOS: 85 U/L (ref 39–117)
ALT: 16 U/L (ref 0–35)
AST: 18 U/L (ref 0–37)
BILIRUBIN TOTAL: 0.6 mg/dL (ref 0.3–1.2)
BUN: 7 mg/dL (ref 6–23)
CO2: 27 mEq/L (ref 19–32)
Calcium: 9.4 mg/dL (ref 8.4–10.5)
Chloride: 105 mEq/L (ref 96–112)
Creatinine, Ser: 0.5 mg/dL (ref 0.4–1.2)
GFR: 121.96 mL/min (ref >60.00)
Glucose, Bld: 101 mg/dL — ABNORMAL HIGH (ref 70–99)
Potassium: 3.9 mEq/L (ref 3.5–5.1)
SODIUM: 140 meq/L (ref 135–145)
TOTAL PROTEIN: 7.9 g/dL (ref 6.0–8.3)

## 2013-10-11 LAB — MICROALBUMIN / CREATININE URINE RATIO
Creatinine,U: 14.6 mg/dL
Microalb Creat Ratio: 1.4 mg/g (ref 0.0–30.0)
Microalb, Ur: 0.2 mg/dL (ref 0.0–1.9)

## 2013-10-11 MED ORDER — ALPRAZOLAM 0.5 MG PO TABS: 0.5000 mg | ORAL_TABLET | Freq: Three times a day (TID) | ORAL | Status: DC | PRN

## 2013-10-11 MED ORDER — SERTRALINE HCL 50 MG PO TABS: 50.0000 mg | ORAL_TABLET | Freq: Every day | ORAL | Status: DC

## 2013-10-11 NOTE — Progress Notes (Signed)
Pre-visit discussion using our clinic review tool. No additional management support is needed unless otherwise documented below in the visit note.  

## 2013-10-11 NOTE — Patient Instructions (Signed)
Start Sertraline 25mg  (1/2 tablet) at bedtime daily x 4 days, then increase to 50mg  daily.  We will set up counseling with Dr. Rexene Edison.  Follow up 4 weeks and as needed.

## 2013-10-12 ENCOUNTER — Ambulatory Visit: Payer: Medicare PPO | Admitting: Internal Medicine

## 2013-10-12 DIAGNOSIS — F4323 Adjustment disorder with mixed anxiety and depressed mood: Secondary | ICD-10-CM | POA: Insufficient documentation

## 2013-10-12 NOTE — Assessment & Plan Note (Signed)
Recent worsening of symptoms of anxiety and depressed mood after tapering off amitriptyline. Offered support today. Will try starting sertraline 25 mg at bedtime and then increasing to 50 mg at bedtime if well tolerated. Will also set up counseling. Followup in 2-4 weeks or sooner as needed.

## 2013-10-12 NOTE — Progress Notes (Signed)
Subjective:    Patient ID: Jillian Lynch. Sampedro, female    DOB: 08-24-1936, 78 y.o.   MRN: [id number]  HPI 78 year old female with history of breast cancer, hypertension, hyperlipidemia presents for followup. Her primary concern today is worsening symptoms of anxiety and depression over the last few months. She noted that she taper down on her dose of amitriptyline, she had more difficulty sleeping at night and increased symptoms of anxiety and depressed mood. The holidays were difficult for her especially given the recent death of her son last year. She is not currently undergoing counseling. She is not currently taking medication for depression. Aside from this, she reports she is feeling well. She is compliant with medications.  Outpatient Encounter Prescriptions as of 10/11/2013  Medication Sig  . ALPRAZolam (XANAX) 0.5 MG tablet Take 1 tablet (0.5 mg total) by mouth 3 (three) times daily as needed for anxiety.  Marland Kitchen aspirin EC 81 MG tablet Take 81 mg by mouth daily.    Marland Kitchen atorvastatin (LIPITOR) 20 MG tablet TAKE ONE TABLET BY MOUTH EVERY DAY  . cholecalciferol (VITAMIN D) 1000 UNITS tablet Take 1,000 Units by mouth daily.  . enalapril (VASOTEC) 10 MG tablet TAKE ONE TABLET BY MOUTH EVERY DAY  . fish oil-omega-3 fatty acids 1000 MG capsule Take 2 g by mouth daily.    . fluticasone (FLONASE) 50 MCG/ACT nasal spray Place 2 sprays into the nose daily.  Marland Kitchen gabapentin (NEURONTIN) 300 MG capsule Take 1 capsule (300 mg total) by mouth 3 (three) times daily.  Marland Kitchen levothyroxine (SYNTHROID, LEVOTHROID) 75 MCG tablet TAKE ONE TABLET BY MOUTH ONCE DAILY  . meloxicam (MOBIC) 15 MG tablet Take 1 tablet (15 mg total) by mouth as needed.  Marland Kitchen omeprazole (PRILOSEC) 20 MG capsule Take 20 mg by mouth daily.      Review of Systems  Constitutional: Negative for fever, chills, appetite change, fatigue and unexpected weight change.  HENT: Negative for congestion, ear pain, sinus pressure, sore throat, trouble swallowing and  voice change.   Eyes: Negative for visual disturbance.  Respiratory: Negative for cough, shortness of breath, wheezing and stridor.   Cardiovascular: Negative for chest pain, palpitations and leg swelling.  Gastrointestinal: Negative for nausea, vomiting, abdominal pain, diarrhea, constipation, blood in stool, abdominal distention and anal bleeding.  Genitourinary: Negative for dysuria and flank pain.  Musculoskeletal: Negative for arthralgias, gait problem, myalgias and neck pain.  Skin: Negative for color change and rash.  Neurological: Negative for dizziness and headaches.  Hematological: Negative for adenopathy. Does not bruise/bleed easily.  Psychiatric/Behavioral: Positive for sleep disturbance and dysphoric mood. Negative for suicidal ideas. The patient is nervous/anxious.        Objective:   Physical Exam  Constitutional: She is oriented to person, place, and time. She appears well-developed and well-nourished. No distress.  HENT:  Head: Normocephalic and atraumatic.  Right Ear: External ear normal.  Left Ear: External ear normal.  Nose: Nose normal.  Mouth/Throat: Oropharynx is clear and moist. No oropharyngeal exudate.  Eyes: Conjunctivae are normal. Pupils are equal, round, and reactive to light. Right eye exhibits no discharge. Left eye exhibits no discharge. No scleral icterus.  Neck: Normal range of motion. Neck supple. No tracheal deviation present. No thyromegaly present.  Cardiovascular: Normal rate, regular rhythm, normal heart sounds and intact distal pulses.  Exam reveals no gallop and no friction rub.   No murmur heard. Pulmonary/Chest: Effort normal and breath sounds normal. No accessory muscle usage. Not tachypneic. No respiratory distress. She  has no decreased breath sounds. She has no wheezes. She has no rhonchi. She has no rales. She exhibits no tenderness.  Musculoskeletal: Normal range of motion. She exhibits no edema and no tenderness.  Lymphadenopathy:     She has no cervical adenopathy.  Neurological: She is alert and oriented to person, place, and time. No cranial nerve deficit. She exhibits normal muscle tone. Coordination normal.  Skin: Skin is warm and dry. No rash noted. She is not diaphoretic. No erythema. No pallor.  Psychiatric: Her speech is normal and behavior is normal. Judgment and thought content normal. Her mood appears anxious. She exhibits a depressed mood. She expresses no suicidal ideation.          Assessment & Plan:

## 2013-10-12 NOTE — Assessment & Plan Note (Signed)
BP Readings from Last 3 Encounters:  10/11/13 144/72  04/07/13 138/78  02/25/13 148/76   Blood pressure generally has been well controlled on enalapril. We'll continue. Will check renal function with labs.

## 2013-10-12 NOTE — Assessment & Plan Note (Signed)
Will check lipids and LFTs with labs. Continue atorvastatin.

## 2013-10-19 ENCOUNTER — Emergency Department (HOSPITAL_COMMUNITY): Payer: Medicare PPO

## 2013-10-19 ENCOUNTER — Encounter (HOSPITAL_COMMUNITY): Payer: Self-pay | Admitting: Emergency Medicine

## 2013-10-19 ENCOUNTER — Ambulatory Visit (AMBULATORY_SURGERY_CENTER): Payer: Medicare PPO | Admitting: Internal Medicine

## 2013-10-19 ENCOUNTER — Emergency Department (HOSPITAL_COMMUNITY)
Admission: EM | Admit: 2013-10-19 | Discharge: 2013-10-19 | Disposition: A | Discharge status: Home / Self Care | Payer: Medicare PPO | Source: Home / Self Care | Attending: Emergency Medicine | Admitting: Emergency Medicine

## 2013-10-19 ENCOUNTER — Telehealth: Payer: Self-pay | Admitting: Internal Medicine

## 2013-10-19 ENCOUNTER — Encounter: Payer: Self-pay | Admitting: Internal Medicine

## 2013-10-19 VITALS — BP 126/55 | HR 66 | Temp 97.6°F | Resp 18 | Ht 59.0 in | Wt 162.0 lb

## 2013-10-19 DIAGNOSIS — K922 Gastrointestinal hemorrhage, unspecified: Secondary | ICD-10-CM

## 2013-10-19 DIAGNOSIS — F411 Generalized anxiety disorder: Secondary | ICD-10-CM | POA: Insufficient documentation

## 2013-10-19 DIAGNOSIS — E079 Disorder of thyroid, unspecified: Secondary | ICD-10-CM | POA: Insufficient documentation

## 2013-10-19 DIAGNOSIS — Z85828 Personal history of other malignant neoplasm of skin: Secondary | ICD-10-CM | POA: Insufficient documentation

## 2013-10-19 DIAGNOSIS — Z1211 Encounter for screening for malignant neoplasm of colon: Secondary | ICD-10-CM

## 2013-10-19 DIAGNOSIS — E785 Hyperlipidemia, unspecified: Secondary | ICD-10-CM | POA: Insufficient documentation

## 2013-10-19 DIAGNOSIS — IMO0002 Reserved for concepts with insufficient information to code with codable children: Secondary | ICD-10-CM | POA: Insufficient documentation

## 2013-10-19 DIAGNOSIS — I10 Essential (primary) hypertension: Secondary | ICD-10-CM | POA: Insufficient documentation

## 2013-10-19 DIAGNOSIS — Z79899 Other long term (current) drug therapy: Secondary | ICD-10-CM | POA: Insufficient documentation

## 2013-10-19 DIAGNOSIS — G4700 Insomnia, unspecified: Secondary | ICD-10-CM | POA: Insufficient documentation

## 2013-10-19 DIAGNOSIS — Z853 Personal history of malignant neoplasm of breast: Secondary | ICD-10-CM | POA: Insufficient documentation

## 2013-10-19 DIAGNOSIS — Z7982 Long term (current) use of aspirin: Secondary | ICD-10-CM | POA: Insufficient documentation

## 2013-10-19 DIAGNOSIS — Z8619 Personal history of other infectious and parasitic diseases: Secondary | ICD-10-CM | POA: Insufficient documentation

## 2013-10-19 DIAGNOSIS — D126 Benign neoplasm of colon, unspecified: Secondary | ICD-10-CM

## 2013-10-19 LAB — COMPREHENSIVE METABOLIC PANEL
ALK PHOS: 90 U/L (ref 39–117)
ALT: 14 U/L (ref 0–35)
AST: 16 U/L (ref 0–37)
Albumin: 3.5 g/dL (ref 3.5–5.2)
BILIRUBIN TOTAL: 0.2 mg/dL — AB (ref 0.3–1.2)
BUN: 9 mg/dL (ref 6–23)
CHLORIDE: 105 meq/L (ref 96–112)
CO2: 24 mEq/L (ref 19–32)
Calcium: 8.6 mg/dL (ref 8.4–10.5)
Creatinine, Ser: 0.52 mg/dL (ref 0.50–1.10)
GFR calc Af Amer: >90 mL/min (ref >90)
GLUCOSE: 119 mg/dL — AB (ref 70–99)
POTASSIUM: 5.1 meq/L (ref 3.7–5.3)
Sodium: 140 mEq/L (ref 137–147)
Total Protein: 7.3 g/dL (ref 6.0–8.3)

## 2013-10-19 LAB — CBC WITH DIFFERENTIAL/PLATELET
Basophils Absolute: 0 10*3/uL (ref 0.0–0.1)
Basophils Relative: 0 % (ref 0–1)
Eosinophils Absolute: 0 10*3/uL (ref 0.0–0.7)
Eosinophils Relative: 0 % (ref 0–5)
HEMATOCRIT: 39.6 % (ref 36.0–46.0)
HEMOGLOBIN: 12.9 g/dL (ref 12.0–15.0)
Lymphocytes Relative: 7 % — ABNORMAL LOW (ref 12–46)
Lymphs Abs: 1 10*3/uL (ref 0.7–4.0)
MCH: 29.7 pg (ref 26.0–34.0)
MCHC: 32.6 g/dL (ref 30.0–36.0)
MCV: 91 fL (ref 78.0–100.0)
MONOS PCT: 5 % (ref 3–12)
Monocytes Absolute: 0.7 10*3/uL (ref 0.1–1.0)
NEUTROS ABS: 12.2 10*3/uL — AB (ref 1.7–7.7)
NEUTROS PCT: 87 % — AB (ref 43–77)
Platelets: 307 10*3/uL (ref 150–400)
RBC: 4.35 MIL/uL (ref 3.87–5.11)
RDW: 13.6 % (ref 11.5–15.5)
WBC: 14 10*3/uL — ABNORMAL HIGH (ref 4.0–10.5)

## 2013-10-19 LAB — SAMPLE TO BLOOD BANK

## 2013-10-19 LAB — OCCULT BLOOD, POC DEVICE: FECAL OCCULT BLD: POSITIVE — AB

## 2013-10-19 MED ORDER — SODIUM CHLORIDE 0.9 % IV SOLN: 500.0000 mL | INTRAVENOUS | Status: DC

## 2013-10-19 NOTE — ED Provider Notes (Signed)
CSN: [id number]     Arrival date & time 10/19/13  1931 History   First MD Initiated Contact with Patient 10/19/13 2042     Chief Complaint  Patient presents with  . Rectal Bleeding   (Consider location/radiation/quality/duration/timing/severity/associated sxs/prior Treatment) Patient is a 78 y.o. female presenting with hematochezia.  Rectal Bleeding  Pt is a pleasant 78 y.o. female who had a routine screening colonoscopy earlier today, reports there was one polyp removed and a second 'flat' polyp that was biopsied. She was doing well post-procedure, went home to eat lunch and take a nap. She woke up around 4pm with stomach 'gurgling' and reports a dark black stool. She had several additional loose stools which progressed to include bright red blood. Advised by GI to come to the ED for evaluation. She reports little appetite today but no nausea, vomiting or abdominal pain. She did eat lunch and dinner today without difficulty.   Past Medical History  Diagnosis Date  . HTN (hypertension)   . Hyperlipidemia   . Anxiety   . Herpes zoster     Left arm 2007  . Breast cancer 2006    Dr Lucile Crater, left breast, had chemo,mastectomy and radiation  . Basal cell carcinoma     Left eyebrow  . Thyroid disease   . Insomnia   . Vertigo    Past Surgical History  Procedure Laterality Date  . Shoulder surgery      Right  . Mastectomy, radical  2007    left   Family History  Problem Relation Age of Onset  . Heart disease Mother   . Heart disease Father   . Cancer Sister     unsure type  . Colon cancer Other 21   History  Substance Use Topics  . Smoking status: Never Smoker   . Smokeless tobacco: Never Used  . Alcohol Use: Yes     Comment: Occasional - wine or beer   OB History   Grav Para Term Preterm Abortions TAB SAB Ect Mult Living                 Review of Systems  Gastrointestinal: Positive for hematochezia.   All other systems reviewed and are negative except as noted  in HPI.   Allergies  Tape  Home Medications   Current Outpatient Rx  Name  Route  Sig  Dispense  Refill  . acetaminophen (TYLENOL) 325 MG tablet   Oral   Take 325 mg by mouth every 6 (six) hours as needed.         . ALPRAZolam (XANAX) 0.5 MG tablet   Oral   Take 1 tablet (0.5 mg total) by mouth 3 (three) times daily as needed for anxiety.   90 tablet   3   . aspirin EC 81 MG tablet   Oral   Take 81 mg by mouth daily.           Marland Kitchen atorvastatin (LIPITOR) 20 MG tablet      TAKE ONE TABLET BY MOUTH EVERY DAY   90 tablet   2   . cholecalciferol (VITAMIN D) 1000 UNITS tablet   Oral   Take 1,000 Units by mouth daily.         . enalapril (VASOTEC) 10 MG tablet      TAKE ONE TABLET BY MOUTH EVERY DAY   90 tablet   2   . fish oil-omega-3 fatty acids 1000 MG capsule   Oral   Take 2  g by mouth daily.           . fluticasone (FLONASE) 50 MCG/ACT nasal spray   Nasal   Place 2 sprays into the nose daily.   16 g   3   . gabapentin (NEURONTIN) 300 MG capsule   Oral   Take 1 capsule (300 mg total) by mouth 3 (three) times daily.   90 capsule   3   . levothyroxine (SYNTHROID, LEVOTHROID) 75 MCG tablet      TAKE ONE TABLET BY MOUTH ONCE DAILY   90 tablet   3   . meloxicam (MOBIC) 15 MG tablet   Oral   Take 1 tablet (15 mg total) by mouth as needed.   30 tablet   5   . omeprazole (PRILOSEC) 20 MG capsule   Oral   Take 20 mg by mouth daily.           . sertraline (ZOLOFT) 50 MG tablet   Oral   Take 1 tablet (50 mg total) by mouth daily.   30 tablet   3    BP 178/65  Pulse 111  Temp(Src) 97.8 F (36.6 C) (Oral)  Resp 18  SpO2 95% Physical Exam  Nursing note and vitals reviewed. Constitutional: She is oriented to person, place, and time. She appears well-developed and well-nourished.  HENT:  Head: Normocephalic and atraumatic.  Eyes: EOM are normal. Pupils are equal, round, and reactive to light.  Neck: Normal range of motion. Neck supple.   Cardiovascular: Normal rate, normal heart sounds and intact distal pulses.   Pulmonary/Chest: Effort normal and breath sounds normal.  Abdominal: Bowel sounds are normal. She exhibits no distension. There is no tenderness.  Genitourinary:  Scant pink tinged stool in rectal vault heme positive  Musculoskeletal: Normal range of motion. She exhibits no edema and no tenderness.  Neurological: She is alert and oriented to person, place, and time. She has normal strength. No cranial nerve deficit or sensory deficit.  Skin: Skin is warm and dry. No rash noted.  Psychiatric: She has a normal mood and affect.    ED Course  Procedures (including critical care time) Labs Review Labs Reviewed  CBC WITH DIFFERENTIAL - Abnormal; Notable for the following:    WBC 14.0 (*)    Neutrophils Relative % 87 (*)    Neutro Abs 12.2 (*)    Lymphocytes Relative 7 (*)    All other components within normal limits  COMPREHENSIVE METABOLIC PANEL - Abnormal; Notable for the following:    Glucose, Bld 119 (*)    Total Bilirubin 0.2 (*)    All other components within normal limits  OCCULT BLOOD, POC DEVICE - Abnormal; Notable for the following:    Fecal Occult Bld POSITIVE (*)    All other components within normal limits  SAMPLE TO BLOOD BANK   Imaging Review Dg Abd Acute W/chest  10/19/2013   CLINICAL DATA:  Status post colonoscopy, with rectal bleeding. Evaluate for free intra-abdominal air.  EXAM: ACUTE ABDOMEN SERIES (ABDOMEN 2 VIEW & CHEST 1 VIEW)  COMPARISON:  Pelvic radiograph performed 10/14/2011  FINDINGS: Mild left-sided volume loss is suggested, with nonspecific mild opacities at the left midlung zone. Underlying nodules cannot be excluded. The right lung appears grossly clear. The cardiomediastinal silhouette is borderline normal in size. Numerous clips are seen overlying the left axilla.  The visualized bowel gas pattern is unremarkable. Scattered fluid and air are seen within the colon; there is no  evidence of small  bowel dilatation to suggest obstruction. No free intra-abdominal air is identified on the provided upright view.  No acute osseous abnormalities are seen; the sacroiliac joints are unremarkable in appearance.  IMPRESSION: 1. Unremarkable bowel gas pattern; no free intra-abdominal air seen. 2. Nonspecific mild focal opacities at the left midlung zone. Underlying nodules cannot be excluded. Suggestion of mild left-sided volume loss. CT of the chest would be helpful for further evaluation, on an elective nonemergent basis.   Electronically Signed   By: Garald Balding M.D.   On: 10/19/2013 22:43    EKG Interpretation   None       MDM   1. Lower GI bleed     Pt with bleeding, but no significant change in Hgb and normal BP. Discussed with Dr. Olevia Perches who agrees with plan to arrange for close followup in their office tomorrow morning.     Damauri Minion B. Karle Starch, MD 10/19/13 2300

## 2013-10-19 NOTE — Telephone Encounter (Signed)
Pt states she had large amt of black stool- enough to fill the toilet- just now.  No pain, abdomen soft.  No bright red blood noted.    Dr. Norman Herrlich notified and instructed if pt has one more occurrence like this- either dark stool or red blood noted, go to the ER; either [facility] or [facility]  Pt made aware and understanding voiced

## 2013-10-19 NOTE — Op Note (Signed)
[place]  [address] [place], [id number]   COLONOSCOPY PROCEDURE REPORT  PATIENT: Jillian, Lynch  MR#: [id number] BIRTHDATE: 09/03/1936 , 16  yrs. old GENDER: Female ENDOSCOPIST: Jerene Bears, MD REFERRED EN:IDPOEUMP Gilford Rile, M.D. PROCEDURE DATE:  10/19/2013 PROCEDURE:   Colonoscopy with snare polypectomy, Submucosal injection, any substance, and Colonoscopy with cold biopsy polypectomy First Screening Colonoscopy - Avg.  risk and is 50 yrs.  old or older Yes.  Prior Negative Screening - Now for repeat screening. N/A  History of Adenoma - Now for follow-up colonoscopy & has been > or = to 3 yrs.  N/A  Polyps Removed Today? Yes. ASA CLASS:   Class III INDICATIONS:average risk screening and first colonoscopy. MEDICATIONS: MAC sedation, administered by CRNA and propofol (Diprivan) 300mg  IV  DESCRIPTION OF PROCEDURE:   After the risks benefits and alternatives of the procedure were thoroughly explained, informed consent was obtained.  A digital rectal exam revealed no rectal mass.   The LB [id number]-H190 [id number]  endoscope was introduced through the anus and advanced to the cecum, which was identified by both the appendix and ileocecal valve. No adverse events experienced. The quality of the prep was good, using MoviPrep  The instrument was then slowly withdrawn as the colon was fully examined.   COLON FINDINGS: A sessile polyp measuring 20-25 mm in size was found in the ascending colon.  Endoscopic mucosal resection was performed in a piecemeal fashion by injecting saline and methylene blue into the submucosa to raise the lesion and polypectomy was performed with snare cautery. This polyp lifted well at the edges, but less so in the center. The polyp was not able to be snared completely. The resection was likely incomplete and attempt made to remove the remaining polyp tissue with cold forceps.  There was no 'target sign' at EMR site.  There was minimal blood loss  from the polypectomy which ceased spontaneously.  A tattoo was applied, though this polyp is between the IC valve and the 1st ascending colon fold (opposite wall from IC valve).   A sessile polyp measuring 4 mm in size was found in the sigmoid colon.  A polypectomy was performed with a cold snare.  The resection was complete and the polyp tissue was completely retrieved.   There was moderate diverticulosis noted in the descending colon and sigmoid colon with associated tortuosity.  Retroflexed views revealed small external hemorrhoids. The time to cecum=5 minutes 54 seconds. Withdrawal time=29 minutes 50 seconds.  The scope was withdrawn and the procedure completed. COMPLICATIONS: There were no complications.  ENDOSCOPIC IMPRESSION: 1.   Sessile polyp measuring 20-25 mm in size was found in the ascending colon; endoscopic mucosal resection was performed; a tattoo was applied.  Resection likely incomplete. 2.   Sessile polyp measuring 4 mm in size was found in the sigmoid colon; polypectomy was performed with a cold snare 3.   There was moderate diverticulosis noted in the descending colon and sigmoid colon  RECOMMENDATIONS: 1.  Hold aspirin, aspirin products, and anti-inflammatory medication for 2 weeks. 2.  Await pathology results 3.  Further recommendations after pathology results, office follow-up to discuss options   eSigned:  Jerene Bears, MD 10/19/2013 10:08 AM   cc: The Patient and Ronette Deter MD   PATIENT NAME:  Jillian, Lynch MR#: [id number]

## 2013-10-19 NOTE — Progress Notes (Signed)
Called to room to assist during endoscopic procedure.  Patient ID and intended procedure confirmed with present staff. Received instructions for my participation in the procedure from the performing physician.  

## 2013-10-19 NOTE — Progress Notes (Signed)
Lidocaine-40mg IV prior to Propofol InductionPropofol given over incremental dosages 

## 2013-10-19 NOTE — ED Notes (Signed)
The pt had a colonoscopy this am.   This afternoon she has had 3 stools that started off being dark  Red now the blood is getting brighter red.  She has some diarrhea.  She had polyp removal.  No pain

## 2013-10-19 NOTE — Patient Instructions (Addendum)
Information sheets on polyps and diverticulosis was given.  No aspirin, aspirin products and anti-inflammatory medicines for 2 weeks.  YOU HAD AN ENDOSCOPIC PROCEDURE TODAY AT [facility]: Refer to the procedure report that was given to you for any specific questions about what was found during the examination.  If the procedure report does not answer your questions, please call your gastroenterologist to clarify.  If you requested that your care partner not be given the details of your procedure findings, then the procedure report has been included in a sealed envelope for you to review at your convenience later.  YOU SHOULD EXPECT: Some feelings of bloating in the abdomen. Passage of more gas than usual.  Walking can help get rid of the air that was put into your GI tract during the procedure and reduce the bloating. If you had a lower endoscopy (such as a colonoscopy or flexible sigmoidoscopy) you may notice spotting of blood in your stool or on the toilet paper. If you underwent a bowel prep for your procedure, then you may not have a normal bowel movement for a few days.  DIET: Your first meal following the procedure should be a light meal and then it is ok to progress to your normal diet.  A half-sandwich or bowl of soup is an example of a good first meal.  Heavy or fried foods are harder to digest and may make you feel nauseous or bloated.  Likewise meals heavy in dairy and vegetables can cause extra gas to form and this can also increase the bloating.  Drink plenty of fluids but you should avoid alcoholic beverages for 24 hours.  ACTIVITY: Your care partner should take you home directly after the procedure.  You should plan to take it easy, moving slowly for the rest of the day.  You can resume normal activity the day after the procedure however you should NOT DRIVE or use heavy machinery for 24 hours (because of the sedation medicines used during the test).    SYMPTOMS TO REPORT  IMMEDIATELY: A gastroenterologist can be reached at any hour.  During normal business hours, 8:30 AM to 5:00 PM Monday through Friday, call [phone].  After hours and on weekends, please call the GI answering service at [phone] who will take a message and have the physician on call contact you.   Following lower endoscopy (colonoscopy or flexible sigmoidoscopy):  Excessive amounts of blood in the stool  Significant tenderness or worsening of abdominal pains  Swelling of the abdomen that is new, acute  Fever of 100F or higher  FOLLOW UP: If any biopsies were taken you will be contacted by phone or by letter within the next 1-3 weeks.  Call your gastroenterologist if you have not heard about the biopsies in 3 weeks.  Our staff will call the home number listed on your records the next business day following your procedure to check on you and address any questions or concerns that you may have at that time regarding the information given to you following your procedure. This is a courtesy call and so if there is no answer at the home number and we have not heard from you through the emergency physician on call, we will assume that you have returned to your regular daily activities without incident.  SIGNATURES/CONFIDENTIALITY: You and/or your care partner have signed paperwork which will be entered into your electronic medical record.  These signatures attest to the fact that that the information  above on your After Visit Summary has been reviewed and is understood.  Full responsibility of the confidentiality of this discharge information lies with you and/or your care-partner. 

## 2013-10-19 NOTE — Discharge Instructions (Signed)
Please Call Dr. Hilarie Fredrickson in the morning to arrange for followup tomorrow morning  Bloody Stools Bloody stools often mean that there is a problem in the digestive tract. Your caregiver may use the term "melena" to describe black, tarry, and bad smelling stools or "hematochezia" to describe red or maroon-colored stools. Blood seen in the stool can be caused by bleeding anywhere along the intestinal tract.  A black stool usually means that blood is coming from the upper part of the gastrointestinal tract (esophagus, stomach, or small bowel). Passing maroon-colored stools or bright red blood usually means that blood is coming from lower down in the large bowel or the rectum. However, sometimes massive bleeding in the stomach or small intestine can cause bright red bloody stools.  Consuming black licorice, lead, iron pills, medicines containing bismuth subsalicylate, or blueberries can also cause black stools. Your caregiver can test black stools to see if blood is present. It is important that the cause of the bleeding be found. Treatment can then be started, and the problem can be corrected. Rectal bleeding may not be serious, but you should not assume everything is okay until you know the cause.It is very important to follow up with your caregiver or a specialist in gastrointestinal problems. CAUSES  Blood in the stools can come from various underlying causes.Often, the cause is not found during your first visit. Testing is often needed to discover the cause of bleeding in the gastrointestinal tract. Causes range from simple to serious or even life-threatening.Possible causes include:  Hemorrhoids.These are veins that are full of blood (engorged) in the rectum. They cause pain, inflammation, and may bleed.  Anal fissures.These are areas of painful tearing which may bleed. They are often caused by passing hard stool.  Diverticulosis.These are pouches that form on the colon over time, with age, and  may bleed significantly.  Diverticulitis.This is inflammation in areas with diverticulosis. It can cause pain, fever, and bloody stools, although bleeding is rare.  Proctitis and colitis. These are inflamed areas of the rectum or colon. They may cause pain, fever, and bloody stools.  Polyps and cancer. Colon cancer is a leading cause of preventable cancer death.It often starts out as precancerous polyps that can be removed during a colonoscopy, preventing progression into cancer. Sometimes, polyps and cancer may cause rectal bleeding.  Gastritis and ulcers.Bleeding from the upper gastrointestinal tract (near the stomach) may travel through the intestines and produce black, sometimes tarry, often bad smelling stools. In certain cases, if the bleeding is fast enough, the stools may not be black, but red and the condition may be life-threatening. SYMPTOMS  You may have stools that are bright red and bloody, that are normal color with blood on them, or that are dark black and tarry. In some cases, you may only have blood in the toilet bowl. Any of these cases need medical care. You may also have:  Pain at the anus or anywhere in the rectum.  Lightheadedness or feeling faint.  Extreme weakness.  Nausea or vomiting.  Fever. DIAGNOSIS Your caregiver may use the following methods to find the cause of your bleeding:  Taking a medical history. Age is important. Older people tend to develop polyps and cancer more often. If there is anal pain and a hard, large stool associated with bleeding, a tear of the anus may be the cause. If blood drips into the toilet after a bowel movement, bleeding hemorrhoids may be the problem. The color and frequency of the bleeding are  additional considerations. In most cases, the medical history provides clues, but seldom the final answer.  A visual and finger (digital) exam. Your caregiver will inspect the anal area, looking for tears and hemorrhoids. A finger exam  can provide information when there is tenderness or a growth inside. In men, the prostate is also examined.  Endoscopy. Several types of small, long scopes (endoscopes) are used to view the colon.  In the office, your caregiver may use a rigid, or more commonly, a flexible viewing sigmoidoscope. This exam is called flexible sigmoidoscopy. It is performed in 5 to 10 minutes.  A more thorough exam is accomplished with a colonoscope. It allows your caregiver to view the entire 5 to 6 foot long colon. Medicine to help you relax (sedative) is usually given for this exam. Frequently, a bleeding lesion may be present beyond the reach of the sigmoidoscope. So, a colonoscopy may be the best exam to start with. Both exams are usually done on an outpatient basis. This means the patient does not stay overnight in the hospital or surgery center.  An upper endoscopy may be needed to examine your stomach. Sedation is used and a flexible endoscope is put in your mouth, down to your stomach.  A barium enema X-ray. This is an X-ray exam. It uses liquid barium inserted by enema into the rectum. This test alone may not identify an actual bleeding point. X-rays highlight abnormal shadows, such as those made by lumps (tumors), diverticuli, or colitis. TREATMENT  Treatment depends on the cause of your bleeding.   For bleeding from the stomach or colon, the caregiver doing your endoscopy or colonoscopy may be able to stop the bleeding as part of the procedure.  Inflammation or infection of the colon can be treated with medicines.  Many rectal problems can be treated with creams, suppositories, or warm baths.  Surgery is sometimes needed.  Blood transfusions are sometimes needed if you have lost a lot of blood.  For any bleeding problem, let your caregiver know if you take aspirin or other blood thinners regularly. HOME CARE INSTRUCTIONS   Take any medicines exactly as prescribed.  Keep your stools soft by  eating a diet high in fiber. Prunes (1 to 3 a day) work well for many people.  Drink enough water and fluids to keep your urine clear or pale yellow.  Take sitz baths if advised. A sitz bath is when you sit in a bathtub with warm water for 10 to 15 minutes to soak, soothe, and cleanse the rectal area.  If enemas or suppositories are advised, be sure you know how to use them. Tell your caregiver if you have problems with this.  Monitor your bowel movements to look for signs of improvement or worsening. SEEK MEDICAL CARE IF:   You do not improve in the time expected.  Your condition worsens after initial improvement.  You develop any new symptoms. SEEK IMMEDIATE MEDICAL CARE IF:   You develop severe or prolonged rectal bleeding.  You vomit blood.  You feel weak or faint.  You have a fever. MAKE SURE YOU:  Understand these instructions.  Will watch your condition.  Will get help right away if you are not doing well or get worse. Document Released: 09/05/2002 Document Revised: 12/08/2011 Document Reviewed: 01/31/2011 [facility] Patient Information 2014 [place], [place].

## 2013-10-20 ENCOUNTER — Ambulatory Visit (INDEPENDENT_AMBULATORY_CARE_PROVIDER_SITE_OTHER): Payer: Medicare PPO | Admitting: Physician Assistant

## 2013-10-20 ENCOUNTER — Inpatient Hospital Stay (HOSPITAL_COMMUNITY)
Admission: EM | Admit: 2013-10-20 | Discharge: 2013-10-24 | DRG: 920 | Disposition: A | Discharge status: Home / Self Care | Payer: Medicare PPO | Attending: Internal Medicine | Admitting: Internal Medicine

## 2013-10-20 ENCOUNTER — Encounter: Payer: Self-pay | Admitting: Physician Assistant

## 2013-10-20 ENCOUNTER — Telehealth: Payer: Self-pay | Admitting: *Deleted

## 2013-10-20 ENCOUNTER — Encounter (HOSPITAL_COMMUNITY): Payer: Self-pay | Admitting: Emergency Medicine

## 2013-10-20 ENCOUNTER — Other Ambulatory Visit (INDEPENDENT_AMBULATORY_CARE_PROVIDER_SITE_OTHER): Payer: Medicare PPO

## 2013-10-20 VITALS — BP 136/68 | HR 84 | Ht 58.5 in | Wt 159.5 lb

## 2013-10-20 DIAGNOSIS — K5730 Diverticulosis of large intestine without perforation or abscess without bleeding: Secondary | ICD-10-CM | POA: Diagnosis present

## 2013-10-20 DIAGNOSIS — Z853 Personal history of malignant neoplasm of breast: Secondary | ICD-10-CM

## 2013-10-20 DIAGNOSIS — F419 Anxiety disorder, unspecified: Secondary | ICD-10-CM | POA: Diagnosis present

## 2013-10-20 DIAGNOSIS — E039 Hypothyroidism, unspecified: Secondary | ICD-10-CM | POA: Diagnosis present

## 2013-10-20 DIAGNOSIS — F3289 Other specified depressive episodes: Secondary | ICD-10-CM | POA: Diagnosis present

## 2013-10-20 DIAGNOSIS — K921 Melena: Secondary | ICD-10-CM | POA: Diagnosis present

## 2013-10-20 DIAGNOSIS — F411 Generalized anxiety disorder: Secondary | ICD-10-CM | POA: Diagnosis present

## 2013-10-20 DIAGNOSIS — K922 Gastrointestinal hemorrhage, unspecified: Secondary | ICD-10-CM

## 2013-10-20 DIAGNOSIS — R5381 Other malaise: Secondary | ICD-10-CM | POA: Diagnosis present

## 2013-10-20 DIAGNOSIS — F329 Major depressive disorder, single episode, unspecified: Secondary | ICD-10-CM | POA: Diagnosis present

## 2013-10-20 DIAGNOSIS — IMO0002 Reserved for concepts with insufficient information to code with codable children: Principal | ICD-10-CM | POA: Diagnosis present

## 2013-10-20 DIAGNOSIS — D62 Acute posthemorrhagic anemia: Secondary | ICD-10-CM | POA: Diagnosis present

## 2013-10-20 DIAGNOSIS — Z85828 Personal history of other malignant neoplasm of skin: Secondary | ICD-10-CM

## 2013-10-20 DIAGNOSIS — Y849 Medical procedure, unspecified as the cause of abnormal reaction of the patient, or of later complication, without mention of misadventure at the time of the procedure: Secondary | ICD-10-CM | POA: Diagnosis present

## 2013-10-20 DIAGNOSIS — Z8249 Family history of ischemic heart disease and other diseases of the circulatory system: Secondary | ICD-10-CM

## 2013-10-20 DIAGNOSIS — I10 Essential (primary) hypertension: Secondary | ICD-10-CM | POA: Diagnosis present

## 2013-10-20 DIAGNOSIS — R5383 Other fatigue: Secondary | ICD-10-CM

## 2013-10-20 DIAGNOSIS — Z7982 Long term (current) use of aspirin: Secondary | ICD-10-CM

## 2013-10-20 DIAGNOSIS — R109 Unspecified abdominal pain: Secondary | ICD-10-CM | POA: Diagnosis present

## 2013-10-20 DIAGNOSIS — Z823 Family history of stroke: Secondary | ICD-10-CM

## 2013-10-20 DIAGNOSIS — F4323 Adjustment disorder with mixed anxiety and depressed mood: Secondary | ICD-10-CM

## 2013-10-20 DIAGNOSIS — D126 Benign neoplasm of colon, unspecified: Secondary | ICD-10-CM | POA: Diagnosis present

## 2013-10-20 DIAGNOSIS — K633 Ulcer of intestine: Secondary | ICD-10-CM

## 2013-10-20 DIAGNOSIS — E785 Hyperlipidemia, unspecified: Secondary | ICD-10-CM | POA: Diagnosis present

## 2013-10-20 LAB — CBC WITH DIFFERENTIAL/PLATELET
Basophils Absolute: 0 10*3/uL (ref 0.0–0.1)
Basophils Relative: 0.2 % (ref 0.0–3.0)
EOS PCT: 0.2 % (ref 0.0–5.0)
Eosinophils Absolute: 0 10*3/uL (ref 0.0–0.7)
HCT: 34.2 % — ABNORMAL LOW (ref 36.0–46.0)
HEMOGLOBIN: 11.5 g/dL — AB (ref 12.0–15.0)
Lymphocytes Relative: 12 % (ref 12.0–46.0)
Lymphs Abs: 1.5 10*3/uL (ref 0.7–4.0)
MCHC: 33.5 g/dL (ref 30.0–36.0)
MCV: 87.9 fl (ref 78.0–100.0)
MONOS PCT: 6.1 % (ref 3.0–12.0)
Monocytes Absolute: 0.8 10*3/uL (ref 0.1–1.0)
Neutro Abs: 10.2 10*3/uL — ABNORMAL HIGH (ref 1.4–7.7)
Neutrophils Relative %: 81.5 % — ABNORMAL HIGH (ref 43.0–77.0)
Platelets: 320 10*3/uL (ref 150.0–400.0)
RBC: 3.89 Mil/uL (ref 3.87–5.11)
RDW: 13.3 % (ref 11.5–14.6)
WBC: 12.6 10*3/uL — ABNORMAL HIGH (ref 4.5–10.5)

## 2013-10-20 LAB — COMPREHENSIVE METABOLIC PANEL
ALT: 14 U/L (ref 0–35)
AST: 16 U/L (ref 0–37)
Albumin: 3.5 g/dL (ref 3.5–5.2)
Alkaline Phosphatase: 86 U/L (ref 39–117)
BUN: 13 mg/dL (ref 6–23)
CALCIUM: 9 mg/dL (ref 8.4–10.5)
CO2: 25 meq/L (ref 19–32)
Chloride: 104 mEq/L (ref 96–112)
Creatinine, Ser: 0.49 mg/dL — ABNORMAL LOW (ref 0.50–1.10)
Glucose, Bld: 130 mg/dL — ABNORMAL HIGH (ref 70–99)
POTASSIUM: 4.5 meq/L (ref 3.7–5.3)
SODIUM: 139 meq/L (ref 137–147)
Total Bilirubin: 0.3 mg/dL (ref 0.3–1.2)
Total Protein: 7.2 g/dL (ref 6.0–8.3)

## 2013-10-20 LAB — CBC
HEMATOCRIT: 32.6 % — AB (ref 36.0–46.0)
Hemoglobin: 10.8 g/dL — ABNORMAL LOW (ref 12.0–15.0)
MCH: 29.5 pg (ref 26.0–34.0)
MCHC: 33.1 g/dL (ref 30.0–36.0)
MCV: 89.1 fL (ref 78.0–100.0)
Platelets: 340 10*3/uL (ref 150–400)
RBC: 3.66 MIL/uL — ABNORMAL LOW (ref 3.87–5.11)
RDW: 13.5 % (ref 11.5–15.5)
WBC: 14.6 10*3/uL — ABNORMAL HIGH (ref 4.0–10.5)

## 2013-10-20 NOTE — Telephone Encounter (Signed)
-----   Message -----  From: Lafayette Dragon, MD  Sent: 10/19/2013 10:17 PM  To: Hulan Saas, RN, Jerene Bears, MD  Subject: ED visit   Ulice Dash, I have spoken to Dr Karle Starch at [facility] ED about Jillian Lynch- post polyp bleed, very stable, Hgb 12.6, there are no beds, pt would have to spend the night in ED. Family agrees to take pt home. Our nurse to call pt in am to arrange for repeat CBC in am and OV with an extender  Rollene Fare, please, relate this to Dr Vena Rua triage nurse. Thanx        Actions:  * If pain score is 4 or above:  No action needed, pain <4.  Pt had 4 total episodes of large amounts of dark black liquids and some tinge of redness after the 1st 2, no episodes since 7 pm last night but pt hasn't eaten. She did go [facility], pt states they told her no perf, liquid diet, she has been drinking. Pt has no abd pain, no vomiting, no fever. Pt states ed dr spoke with brodie last night but was told to follow up wit [place] this am. Pt questions can she has regular diet today and Any further instructions for pt??  This note was from the [facility] RN. Dr Hilarie Fredrickson is off today and I'm trying to get an appt with Nicoletta Ba, PA.  Spoke with pt who states she's had 2 stools this am that are still "dark red'. She states the [place] told her she could have toast; asked her not to eat until I could get back with her. She reports being very hungry and her stomach is really gurgling. I will call her back. Pt stated understanding.  Spoke with Amy and we are trying to move a pt to get her in at 1:30pm. Amy did say she may have clear liquids, no toast. She will have a CBC before she sees Amy. Pt stated understanding. Also, she is to call for more BMs.

## 2013-10-20 NOTE — Telephone Encounter (Signed)
  Follow up Call-  Call back number 10/19/2013  Post procedure Call Back phone  # [phone]  Permission to leave phone message Yes     Patient questions:  Do you have a fever, pain , or abdominal swelling? no Pain Score  0 *  Have you tolerated food without any problems? yes  Have you been able to return to your normal activities? no  Do you have any questions about your discharge instructions: Diet   no Medications  no Follow up visit  no  Do you have questions or concerns about your Care? yes  Actions: * If pain score is 4 or above: No action needed, pain <4. Pt had 4 total episodes of large amounts of dark black liquids and some tinge of redness after the 1st 2, no episodes since 7 pm last night but pt hasn't eaten. She did go to the ED at [facility] last night, pt states they told her no perf, liquid diet, she has been drinking.  Pt has no abd pain, no vomiting, no fever. Pt states ed dr spoke with brodie last night but was told to follow up wit [place] this am. Pt questions can she has regular diet today and Any further instructions for pt??

## 2013-10-20 NOTE — Progress Notes (Addendum)
Subjective:    Patient ID: Jillian Lynch. [place], female    DOB: 06/26/1936, 78 y.o.   MRN: [id number]  HPI;; Jillian Lynch is a very nice 78 year old white female known to Dr. Hilarie Fredrickson who underwent screening colonoscopy yesterday 10/19/2013. She was found to have a large sessile polyp measuring 20-25 mm in the ascending colon. She had endoscopic mucosal resection done in a piecemeal fashion with injection of saline and methylene blue and and polypectomy with snare cautery. The polyp was unable to be snared completely and the resection was likely incomplete. There was minimal blood loss from a polypectomy at that time which ceased spontaneously. Tattoo was done she also had one other 4 mm polyp removed and was noted to have moderate diverticulosis. She states that last evening she had an urge for bowel movement and then passed dark red blood. She had about 4 episodes prior to presenting to the emergency room. She was seen and evaluated there found to be hemodynamically stable with a WBC of 14 hemoglobin of 12.9 hematocrit of 39.6. Case was discussed with the physician on-call and patient was allowed discharged home with plans to follow her up today. She had 2 further episodes early this morning about 7:30 and 8:00 and says that she passed a very small dark bowel movements and has not had any further bowel movements since. She denies any abdominal pain. She does complain of some mild fatigue. She is hungry- she's been on clear liquids since prior to her procedure. She had been taking by a baby aspirin prior to the colonoscopy is not had any aspirin or NSAID since. Repeat CBC was done this afternoon- her hemoglobin is 11.5    Review of Systems  Constitutional: Positive for fatigue.  HENT: Negative.   Eyes: Negative.   Respiratory: Negative.   Cardiovascular: Negative.   Gastrointestinal: Positive for blood in stool.  Endocrine: Negative.   Genitourinary: Negative.   Musculoskeletal: Negative.     Allergic/Immunologic: Negative.   Neurological: Negative.   Hematological: Negative.   Psychiatric/Behavioral: Negative.    Outpatient Prescriptions Prior to Visit  Medication Sig Dispense Refill  . acetaminophen (TYLENOL) 325 MG tablet Take 325 mg by mouth every 6 (six) hours as needed for mild pain.       Marland Kitchen ALPRAZolam (XANAX) 0.5 MG tablet Take 0.5 mg by mouth 3 (three) times daily as needed for anxiety.      Marland Kitchen aspirin EC 81 MG tablet Take 81 mg by mouth daily.        Marland Kitchen atorvastatin (LIPITOR) 20 MG tablet Take 20 mg by mouth daily.      . cholecalciferol (VITAMIN D) 1000 UNITS tablet Take 1,000 Units by mouth daily.      . enalapril (VASOTEC) 10 MG tablet Take 10 mg by mouth daily.      . fish oil-omega-3 fatty acids 1000 MG capsule Take 2 g by mouth daily.        . fluticasone (FLONASE) 50 MCG/ACT nasal spray Place 1 spray into both nostrils daily as needed for allergies or rhinitis.      Marland Kitchen gabapentin (NEURONTIN) 300 MG capsule Take 300 mg by mouth 2 (two) times daily.      Marland Kitchen levothyroxine (SYNTHROID, LEVOTHROID) 75 MCG tablet Take 75 mcg by mouth daily before breakfast.      . meloxicam (MOBIC) 15 MG tablet Take 15 mg by mouth daily as needed for pain.      Marland Kitchen omeprazole (PRILOSEC) 20 MG capsule Take 20  mg by mouth daily.        . sertraline (ZOLOFT) 50 MG tablet Take 50 mg by mouth at bedtime.       No facility-administered medications prior to visit.   Allergies  Allergen Reactions  . Tape Rash    adhesive   Patient Active Problem List   Diagnosis Date Noted  . Adjustment disorder with mixed anxiety and depressed mood 10/12/2013  . Insomnia 12/16/2012  . Hypothyroidism 10/04/2012  . Anxiety 10/04/2012  . Medicare annual wellness visit, subsequent 03/17/2012  . Hyperlipidemia LDL goal < 100 03/17/2012  . Neuropathy 09/03/2011  . Hypertension 09/03/2011   History  Substance Use Topics  . Smoking status: Never Smoker   . Smokeless tobacco: Never Used  . Alcohol Use: Yes      Comment: Occasional - wine or beer   family history includes Colon cancer (age of onset: 20) in her other; Heart disease in her brother, father, mother, and sister; Lung cancer in her brother and sister; Stroke in her sister.     Objective:   Physical Exam  Well-developed white female in no acute distress, pleasant blood pressure 136/68 pulse 84 height 4 foot 10 weight 159. HEENT; nontraumatic normocephalic EOMI PERRLA sclera anicteric, Supple; no JVD, Cardiovascular; regular rate and rhythm with S1-S2 no murmur or gallop, Pulmonary; clear bilaterally, Abdomen ;is soft nontender nondistended bowel sounds are active there is no palpable mass or hepatosplenomegaly, Rectal ;exam not done, Extremities; no clubbing cyanosis or edema skin warm dry, Psych; mood and affect normal and appropriate       Assessment & Plan:  #66  78 year old female with post polypectomy bleeding status post colonoscopy and endoscopic mucosal resection of a large sessile polyp  from the descending colon. Polyp was not able to be completely removed-path is pending #2 mild anemia secondary to above  Fortunately she has not had any bleeding over the past 7 hours and is hemodynamically stable. She has had a mild further drop in her hemoglobin. I do not think she has to be hospitalized at this point but certainly if she has any further episodes of active bleeding she will need to return to the emergency room and be admitted For now home to rest with limited activity, full liquid diet No aspirin or NSAIDs of any sort x 2 weeks Will have her come back to the office tomorrow morning for repeat CBC Again options were discussed with the patient including hospitalization now but she is comfortable with plans to go home to  rest and knows that if she has further active bleeding to go back to the emergency room for admission. Further plans pending results of biopsies  Addendum: Reviewed and agree with initial management. Pt was  subsequently admitted and repeat colonoscopy by Dr. Olevia Perches.  Found oozing at polypectomy site, treated with epi injection and clipping. Now observing Path still pending Jerene Bears, MD

## 2013-10-20 NOTE — Telephone Encounter (Signed)
Informed pt to come at 1:30pm for a labs and then come to our office for labs; pt stated understanding.

## 2013-10-20 NOTE — ED Notes (Signed)
Pt had a colonoscopy yesterday morning and around 5pm last night started noticing some dark stools, 4 total. Pt was seen at [facility] last night for the bleeding and had an x-ray done, no abnormalities on the x-ray. Pt says that she is still having some bleeding from her stools, still somewhat dark in nature but also noticing some bright red blood in her stool.

## 2013-10-20 NOTE — Patient Instructions (Signed)
Home to rest. Full liquid diet. Come back in the morning to the lab , basement level.  After you h ave your blood drawn, come to our 3rd floor waiting room and wait for the results.  Should take less than an hour.  If you have recurrent bleeding go to [facility] .  Take no aspirin, Motrin, Mobic, Meloxicam.  You can take Tylenol, Trylenol Extra strength.

## 2013-10-20 NOTE — Telephone Encounter (Signed)
Message copied by Lance Morin on Thu Oct 20, 2013  8:13 AM ------      Message from: Jerene Bears      Created: Wed Oct 19, 2013  5:01 PM      Regarding: Procedure follow-up       Pt had black stool at home in the afternoon after am colonoscopy      She had no symptoms      I had the RN's in endo tell her to go to the ED if she had another black or frankly bloody stool.      Could you please check on her Thursday AM to see how she is feeing?  Any more blood or black stools?  If so, she needs a blood count and to be seen vs. To the ED      Thanks      JMP             ------

## 2013-10-20 NOTE — Telephone Encounter (Signed)
Spoke with dr Deatra Ina about this pt and was instructed to tell pt low fiber diet x 2 days, lots of fluids and if any further bleeding, abdominal pain, fever, swelling or pain in the abdomen please contact our office. Left this information on the answering machine at home number as pt did not answer phone. Left number to recovery for her to call me back if she has any questions.  E mccraw rn

## 2013-10-20 NOTE — ED Provider Notes (Signed)
CSN: [id number]     Arrival date & time 10/20/13  2033 History   First MD Initiated Contact with Patient 10/20/13 2305     Chief Complaint  Patient presents with  . Rectal Bleeding   (Consider location/radiation/quality/duration/timing/severity/associated sxs/prior Treatment) HPI Patient had ascending colonic polyps removed by gastroenterology yesterday morning and it was seen in emergency department at [facility] yesterday evening for persistent blood in stool. Patient was stable at that time. Discussed with gastroenterology and she was followed up this morning. She had 2 small bowel movements overnight are dark in color. Her hemoglobin has been trending down and was 11.5 when checked in the office. She was told to return to the emergency department for any further bleeding and that she would likely need to be admitted. Patient states that since that time she has had 2 more large bowel movements that were dark with small amounts of bright red blood. She continues to have some very mild right-sided abdominal pain. She's had no nausea or vomiting. She denies any fever chills. She complains of generalized fatigue and lack of energy. Past Medical History  Diagnosis Date  . HTN (hypertension)   . Hyperlipidemia   . Anxiety   . Herpes zoster     Left arm 2007  . Breast cancer 2006    Dr Lucile Crater, left breast, had chemo,mastectomy and radiation  . Basal cell carcinoma     Left eyebrow  . Thyroid disease   . Insomnia   . Vertigo    Past Surgical History  Procedure Laterality Date  . Shoulder surgery Right   . Mastectomy, radical Left 2007   Family History  Problem Relation Age of Onset  . Heart disease Mother   . Heart disease Father   . Lung cancer Sister   . Colon cancer Other 21  . Lung cancer Brother   . Heart disease Brother   . Heart disease Sister   . Stroke Sister    History  Substance Use Topics  . Smoking status: Never Smoker   . Smokeless tobacco: Never Used  .  Alcohol Use: Yes     Comment: Occasional - wine or beer   OB History   Grav Para Term Preterm Abortions TAB SAB Ect Mult Living                 Review of Systems  Constitutional: Positive for fatigue. Negative for fever and chills.  Respiratory: Negative for shortness of breath.   Cardiovascular: Negative for chest pain.  Gastrointestinal: Positive for abdominal pain and blood in stool. Negative for nausea, vomiting and diarrhea.  Genitourinary: Negative for dysuria.  Musculoskeletal: Negative for back pain.  Skin: Negative for rash and wound.  Neurological: Negative for dizziness, weakness, light-headedness, numbness and headaches.  All other systems reviewed and are negative.    Allergies  Tape  Home Medications   Current Outpatient Rx  Name  Route  Sig  Dispense  Refill  . acetaminophen (TYLENOL) 325 MG tablet   Oral   Take 325 mg by mouth every 6 (six) hours as needed for mild pain.          Marland Kitchen ALPRAZolam (XANAX) 0.5 MG tablet   Oral   Take 0.5 mg by mouth 3 (three) times daily as needed for anxiety.         Marland Kitchen atorvastatin (LIPITOR) 20 MG tablet   Oral   Take 20 mg by mouth daily.         Marland Kitchen  cholecalciferol (VITAMIN D) 1000 UNITS tablet   Oral   Take 1,000 Units by mouth daily.         . enalapril (VASOTEC) 10 MG tablet   Oral   Take 10 mg by mouth daily.         . fish oil-omega-3 fatty acids 1000 MG capsule   Oral   Take 2 g by mouth daily.           . fluticasone (FLONASE) 50 MCG/ACT nasal spray   Each Nare   Place 1 spray into both nostrils daily as needed for allergies or rhinitis.         Marland Kitchen gabapentin (NEURONTIN) 300 MG capsule   Oral   Take 300 mg by mouth 2 (two) times daily.         Marland Kitchen levothyroxine (SYNTHROID, LEVOTHROID) 75 MCG tablet   Oral   Take 75 mcg by mouth daily before breakfast.         . omeprazole (PRILOSEC) 20 MG capsule   Oral   Take 20 mg by mouth daily.           . sertraline (ZOLOFT) 50 MG tablet    Oral   Take 50 mg by mouth at bedtime.         Marland Kitchen aspirin EC 81 MG tablet   Oral   Take 81 mg by mouth daily.           . meloxicam (MOBIC) 15 MG tablet   Oral   Take 15 mg by mouth daily as needed for pain.          BP 126/64  Pulse 100  Temp(Src) 98.4 F (36.9 C) (Oral)  Resp 18  SpO2 96% Physical Exam  Nursing note and vitals reviewed. Constitutional: She is oriented to person, place, and time. She appears well-developed and well-nourished. No distress.  HENT:  Head: Normocephalic and atraumatic.  Mouth/Throat: Oropharynx is clear and moist.  Eyes: EOM are normal. Pupils are equal, round, and reactive to light.  Neck: Normal range of motion. Neck supple.  Cardiovascular: Normal rate and regular rhythm.   Pulmonary/Chest: Effort normal and breath sounds normal. No respiratory distress. She has no wheezes. She has no rales. She exhibits no tenderness.  Abdominal: Soft. Bowel sounds are normal. She exhibits no distension and no mass. There is tenderness (mild right-sided abdominal tenderness to palpation. No rebound or guarding.). There is no rebound and no guarding.  Musculoskeletal: Normal range of motion. She exhibits no edema and no tenderness.  Neurological: She is alert and oriented to person, place, and time.  Moves all extremities without deficit. Sensation grossly intact.  Skin: Skin is warm and dry. No rash noted. No erythema.  Psychiatric: She has a normal mood and affect. Her behavior is normal.    ED Course  Procedures (including critical care time) Labs Review Labs Reviewed  CBC - Abnormal; Notable for the following:    WBC 14.6 (*)    RBC 3.66 (*)    Hemoglobin 10.8 (*)    HCT 32.6 (*)    All other components within normal limits  COMPREHENSIVE METABOLIC PANEL - Abnormal; Notable for the following:    Glucose, Bld 130 (*)    Creatinine, Ser 0.49 (*)    All other components within normal limits   Imaging Review Dg Abd Acute W/chest  10/19/2013    CLINICAL DATA:  Status post colonoscopy, with rectal bleeding. Evaluate for free intra-abdominal air.  EXAM: ACUTE  ABDOMEN SERIES (ABDOMEN 2 VIEW & CHEST 1 VIEW)  COMPARISON:  Pelvic radiograph performed 10/14/2011  FINDINGS: Mild left-sided volume loss is suggested, with nonspecific mild opacities at the left midlung zone. Underlying nodules cannot be excluded. The right lung appears grossly clear. The cardiomediastinal silhouette is borderline normal in size. Numerous clips are seen overlying the left axilla.  The visualized bowel gas pattern is unremarkable. Scattered fluid and air are seen within the colon; there is no evidence of small bowel dilatation to suggest obstruction. No free intra-abdominal air is identified on the provided upright view.  No acute osseous abnormalities are seen; the sacroiliac joints are unremarkable in appearance.  IMPRESSION: 1. Unremarkable bowel gas pattern; no free intra-abdominal air seen. 2. Nonspecific mild focal opacities at the left midlung zone. Underlying nodules cannot be excluded. Suggestion of mild left-sided volume loss. CT of the chest would be helpful for further evaluation, on an elective nonemergent basis.   Electronically Signed   By: Garald Balding M.D.   On: 10/19/2013 22:43    EKG Interpretation   None       MDM  Hemoglobin is now 10.8. Will discuss with gastroenterology concerning disposition.  Discussed with Dr. Ardis Hughs. Advises admit to hospitalist and will consult on the patient. He thinks the patient will likely need a colonoscopy in the morning. Asked to have the hospitalist call him when they evaluated the patient.  Julianne Rice, MD 10/21/13 [phone]

## 2013-10-21 ENCOUNTER — Encounter (HOSPITAL_COMMUNITY)
Admission: EM | Disposition: A | Discharge status: Home / Self Care | Payer: Self-pay | Source: Home / Self Care | Attending: Internal Medicine

## 2013-10-21 ENCOUNTER — Telehealth: Payer: Self-pay | Admitting: *Deleted

## 2013-10-21 ENCOUNTER — Encounter (HOSPITAL_COMMUNITY): Payer: Self-pay | Admitting: Emergency Medicine

## 2013-10-21 DIAGNOSIS — K922 Gastrointestinal hemorrhage, unspecified: Secondary | ICD-10-CM | POA: Diagnosis present

## 2013-10-21 HISTORY — PX: COLONOSCOPY: SHX5424

## 2013-10-21 LAB — CBC
HCT: 30.9 % — ABNORMAL LOW (ref 36.0–46.0)
HCT: 24.3 % — ABNORMAL LOW (ref 36.0–46.0)
HEMOGLOBIN: 8.1 g/dL — AB (ref 12.0–15.0)
Hemoglobin: 10 g/dL — ABNORMAL LOW (ref 12.0–15.0)
MCH: 29.1 pg (ref 26.0–34.0)
MCH: 30 pg (ref 26.0–34.0)
MCHC: 32.4 g/dL (ref 30.0–36.0)
MCHC: 33.3 g/dL (ref 30.0–36.0)
MCV: 89.8 fL (ref 78.0–100.0)
MCV: 90 fL (ref 78.0–100.0)
Platelets: 298 10*3/uL (ref 150–400)
Platelets: 276 10*3/uL (ref 150–400)
RBC: 3.44 MIL/uL — ABNORMAL LOW (ref 3.87–5.11)
RBC: 2.7 MIL/uL — AB (ref 3.87–5.11)
RDW: 13.6 % (ref 11.5–15.5)
RDW: 13.8 % (ref 11.5–15.5)
WBC: 15 10*3/uL — ABNORMAL HIGH (ref 4.0–10.5)
WBC: 19.8 10*3/uL — ABNORMAL HIGH (ref 4.0–10.5)

## 2013-10-21 LAB — ABO/RH: ABO/RH(D): O NEG

## 2013-10-21 LAB — MRSA PCR SCREENING: MRSA BY PCR: NEGATIVE

## 2013-10-21 LAB — OCCULT BLOOD, POC DEVICE: Fecal Occult Bld: POSITIVE — AB

## 2013-10-21 LAB — PREPARE RBC (CROSSMATCH)

## 2013-10-21 SURGERY — COLONOSCOPY
Anesthesia: Moderate Sedation

## 2013-10-21 MED ORDER — SODIUM CHLORIDE 0.9 % IV SOLN
INTRAVENOUS | Status: AC
  Administered 2013-10-21: 02:00:00 via INTRAVENOUS

## 2013-10-21 MED ORDER — FENTANYL CITRATE 0.05 MG/ML IJ SOLN
INTRAMUSCULAR | Status: DC | PRN
  Administered 2013-10-21 (×4): 25 ug via INTRAVENOUS

## 2013-10-21 MED ORDER — SERTRALINE HCL 50 MG PO TABS
50.0000 mg | ORAL_TABLET | Freq: Every day | ORAL | Status: DC
  Administered 2013-10-21 – 2013-10-23 (×3): 50 mg via ORAL
  Filled 2013-10-21 (×4): qty 1

## 2013-10-21 MED ORDER — SODIUM CHLORIDE 0.9 % IJ SOLN
3.0000 mL | Freq: Two times a day (BID) | INTRAMUSCULAR | Status: DC
  Administered 2013-10-21 – 2013-10-24 (×7): 3 mL via INTRAVENOUS

## 2013-10-21 MED ORDER — ACETAMINOPHEN 325 MG PO TABS: 325.0000 mg | ORAL_TABLET | Freq: Four times a day (QID) | ORAL | Status: DC | PRN

## 2013-10-21 MED ORDER — SODIUM CHLORIDE 0.9 % IJ SOLN
PREFILLED_SYRINGE | INTRAMUSCULAR | Status: DC | PRN
  Administered 2013-10-21: 13:00:00

## 2013-10-21 MED ORDER — PANTOPRAZOLE SODIUM 40 MG PO TBEC
40.0000 mg | DELAYED_RELEASE_TABLET | Freq: Every day | ORAL | Status: DC
  Administered 2013-10-22 – 2013-10-24 (×3): 40 mg via ORAL
  Filled 2013-10-21 (×3): qty 1

## 2013-10-21 MED ORDER — MIDAZOLAM HCL 10 MG/2ML IJ SOLN
INTRAMUSCULAR | Status: AC
  Filled 2013-10-21: qty 4

## 2013-10-21 MED ORDER — ONDANSETRON HCL 4 MG/2ML IJ SOLN
4.0000 mg | Freq: Once | INTRAMUSCULAR | Status: AC
  Administered 2013-10-21: 4 mg via INTRAVENOUS
  Filled 2013-10-21: qty 2

## 2013-10-21 MED ORDER — MIDAZOLAM HCL 5 MG/5ML IJ SOLN
INTRAMUSCULAR | Status: DC | PRN
  Administered 2013-10-21: 1 mg via INTRAVENOUS
  Administered 2013-10-21 (×3): 2 mg via INTRAVENOUS

## 2013-10-21 MED ORDER — SODIUM CHLORIDE 0.9 % IR SOLN: 100.0000 mL | Freq: Once | Status: DC

## 2013-10-21 MED ORDER — SODIUM CHLORIDE 0.9 % IJ SOLN
INTRAMUSCULAR | Status: DC | PRN
  Administered 2013-10-21: 13:00:00

## 2013-10-21 MED ORDER — GABAPENTIN 300 MG PO CAPS
300.0000 mg | ORAL_CAPSULE | Freq: Two times a day (BID) | ORAL | Status: DC
  Administered 2013-10-21 – 2013-10-24 (×6): 300 mg via ORAL
  Filled 2013-10-21 (×10): qty 1

## 2013-10-21 MED ORDER — FENTANYL CITRATE 0.05 MG/ML IJ SOLN
INTRAMUSCULAR | Status: AC
  Filled 2013-10-21: qty 4

## 2013-10-21 MED ORDER — PEG 3350-KCL-NA BICARB-NACL 420 G PO SOLR
4000.0000 mL | Freq: Once | ORAL | Status: AC
  Administered 2013-10-21: 4000 mL via ORAL
  Filled 2013-10-21: qty 4000

## 2013-10-21 MED ORDER — LEVOTHYROXINE SODIUM 75 MCG PO TABS
75.0000 ug | ORAL_TABLET | Freq: Every day | ORAL | Status: DC
  Administered 2013-10-21 – 2013-10-24 (×4): 75 ug via ORAL
  Filled 2013-10-21 (×6): qty 1

## 2013-10-21 MED ORDER — ALPRAZOLAM 0.5 MG PO TABS
0.5000 mg | ORAL_TABLET | Freq: Three times a day (TID) | ORAL | Status: DC | PRN
  Administered 2013-10-21 – 2013-10-24 (×6): 0.5 mg via ORAL
  Filled 2013-10-21 (×6): qty 1

## 2013-10-21 MED ORDER — SERTRALINE HCL 50 MG PO TABS: 50.0000 mg | ORAL_TABLET | Freq: Every day | ORAL | Status: DC

## 2013-10-21 MED ORDER — ALPRAZOLAM 0.25 MG PO TABS
0.2500 mg | ORAL_TABLET | Freq: Once | ORAL | Status: AC
  Administered 2013-10-21: 0.25 mg via ORAL
  Filled 2013-10-21: qty 1

## 2013-10-21 MED ORDER — ATORVASTATIN CALCIUM 20 MG PO TABS
20.0000 mg | ORAL_TABLET | Freq: Every day | ORAL | Status: DC
  Administered 2013-10-21 – 2013-10-23 (×3): 20 mg via ORAL
  Filled 2013-10-21 (×5): qty 1

## 2013-10-21 MED ORDER — FLUTICASONE PROPIONATE 50 MCG/ACT NA SUSP
1.0000 | Freq: Every day | NASAL | Status: DC | PRN
  Filled 2013-10-21: qty 16

## 2013-10-21 MED ORDER — EPINEPHRINE HCL 0.1 MG/ML IJ SOSY
PREFILLED_SYRINGE | INTRAMUSCULAR | Status: AC
  Filled 2013-10-21: qty 10

## 2013-10-21 NOTE — Consult Note (Signed)
Consultation  Referring Provider:  [facility] (Dr. Elvera Lennox) Primary Care Physician:  Wynona Dove, MD Primary Gastroenterologist:   Erick Blinks, MD      Reason for Consultation: Post-polypectomy bleed             HPI:   Jillian Lynch. Evilsizor is a 78 y.o. female who underwent screening colonoscopy 10/19/13 with findings of a large ascending colon sessile polyp. Polyp incompletely removed. A small sessile polyp was completley removed from sigmoid. Since procedure patient has been having painless rectal bleeding. She notified our office, was sent to ED. Hgb 10, down from baseline of mid 14 range. No dizziness, SOB. Overall feels okay just nervious about bleeding and procedure. Other than baby aspirin patient doesn't take blood thinners at home  Past Medical History  Diagnosis Date  . HTN (hypertension)   . Hyperlipidemia   . Anxiety   . Herpes zoster     Left arm 2007  . Breast cancer 2006    Dr Elna Breslow, left breast, had chemo,mastectomy and radiation  . Basal cell carcinoma     Left eyebrow  . Thyroid disease   . Insomnia   . Vertigo     Past Surgical History  Procedure Laterality Date  . Shoulder surgery Right   . Mastectomy, radical Left 2007    Family History  Problem Relation Age of Onset  . Heart disease Mother   . Heart disease Father   . Lung cancer Sister   . Colon cancer Other 21  . Lung cancer Brother   . Heart disease Brother   . Heart disease Sister   . Stroke Sister      History  Substance Use Topics  . Smoking status: Never Smoker   . Smokeless tobacco: Never Used  . Alcohol Use: Yes     Comment: Occasional - wine or beer    Prior to Admission medications   Medication Sig Start Date End Date Taking? Authorizing Provider  acetaminophen (TYLENOL) 325 MG tablet Take 325 mg by mouth every 6 (six) hours as needed for mild pain.    Yes Historical Provider, MD  ALPRAZolam Prudy Feeler) 0.5 MG tablet Take 0.5 mg by mouth 3 (three) times daily as  needed for anxiety.   Yes Historical Provider, MD  atorvastatin (LIPITOR) 20 MG tablet Take 20 mg by mouth daily.   Yes Historical Provider, MD  cholecalciferol (VITAMIN D) 1000 UNITS tablet Take 1,000 Units by mouth daily.   Yes Historical Provider, MD  enalapril (VASOTEC) 10 MG tablet Take 10 mg by mouth daily.   Yes Historical Provider, MD  fish oil-omega-3 fatty acids 1000 MG capsule Take 2 g by mouth daily.     Yes Historical Provider, MD  fluticasone (FLONASE) 50 MCG/ACT nasal spray Place 1 spray into both nostrils daily as needed for allergies or rhinitis.   Yes Historical Provider, MD  gabapentin (NEURONTIN) 300 MG capsule Take 300 mg by mouth 2 (two) times daily.   Yes Historical Provider, MD  levothyroxine (SYNTHROID, LEVOTHROID) 75 MCG tablet Take 75 mcg by mouth daily before breakfast.   Yes Historical Provider, MD  omeprazole (PRILOSEC) 20 MG capsule Take 20 mg by mouth daily.     Yes Historical Provider, MD  sertraline (ZOLOFT) 50 MG tablet Take 50 mg by mouth at bedtime.   Yes Historical Provider, MD  aspirin EC 81 MG tablet Take 81 mg by mouth daily.      Historical Provider, MD  meloxicam (  MOBIC) 15 MG tablet Take 15 mg by mouth daily as needed for pain.    Historical Provider, MD    Current Facility-Administered Medications  Medication Dose Route Frequency Provider Last Rate Last Dose  . 0.9 %  sodium chloride infusion   Intravenous Continuous Costin Karlyne Greenspan, MD      . acetaminophen (TYLENOL) tablet 325 mg  325 mg Oral Q6H PRN Costin Karlyne Greenspan, MD      . ALPRAZolam Duanne Moron) tablet 0.5 mg  0.5 mg Oral TID PRN Caren Griffins, MD      . atorvastatin (LIPITOR) tablet 20 mg  20 mg Oral q1800 Costin Karlyne Greenspan, MD      . fluticasone (FLONASE) 50 MCG/ACT nasal spray 1 spray  1 spray Each Nare Daily PRN Costin Karlyne Greenspan, MD      . gabapentin (NEURONTIN) capsule 300 mg  300 mg Oral BID Costin Karlyne Greenspan, MD      . levothyroxine (SYNTHROID, LEVOTHROID) tablet 75 mcg  75 mcg Oral QAC  breakfast Caren Griffins, MD   75 mcg at 10/21/13 6967  . pantoprazole (PROTONIX) EC tablet 40 mg  40 mg Oral Daily Costin Karlyne Greenspan, MD      . Derrill Memo ON 10/22/2013] sertraline (ZOLOFT) tablet 50 mg  50 mg Oral QHS Costin Karlyne Greenspan, MD      . sodium chloride 0.9 % injection 3 mL  3 mL Intravenous Q12H Caren Griffins, MD   3 mL at 10/21/13 0154   Current Outpatient Prescriptions  Medication Sig Dispense Refill  . acetaminophen (TYLENOL) 325 MG tablet Take 325 mg by mouth every 6 (six) hours as needed for mild pain.       Marland Kitchen ALPRAZolam (XANAX) 0.5 MG tablet Take 0.5 mg by mouth 3 (three) times daily as needed for anxiety.      Marland Kitchen atorvastatin (LIPITOR) 20 MG tablet Take 20 mg by mouth daily.      . cholecalciferol (VITAMIN D) 1000 UNITS tablet Take 1,000 Units by mouth daily.      . enalapril (VASOTEC) 10 MG tablet Take 10 mg by mouth daily.      . fish oil-omega-3 fatty acids 1000 MG capsule Take 2 g by mouth daily.        . fluticasone (FLONASE) 50 MCG/ACT nasal spray Place 1 spray into both nostrils daily as needed for allergies or rhinitis.      Marland Kitchen gabapentin (NEURONTIN) 300 MG capsule Take 300 mg by mouth 2 (two) times daily.      Marland Kitchen levothyroxine (SYNTHROID, LEVOTHROID) 75 MCG tablet Take 75 mcg by mouth daily before breakfast.      . omeprazole (PRILOSEC) 20 MG capsule Take 20 mg by mouth daily.        . sertraline (ZOLOFT) 50 MG tablet Take 50 mg by mouth at bedtime.      Marland Kitchen aspirin EC 81 MG tablet Take 81 mg by mouth daily.        . meloxicam (MOBIC) 15 MG tablet Take 15 mg by mouth daily as needed for pain.        Allergies as of 10/20/2013 - Review Complete 10/20/2013  Allergen Reaction Noted  . Tape Rash 09/03/2011    Review of Systems:    All systems reviewed and negative except where noted in HPI.    Physical Exam:  Vital signs in last 24 hours: Temp:  [97.7 F (36.5 C)-98.4 F (36.9 C)] 97.7 F (36.5 C) (01/23 0127) Pulse  Rate:  [73-100] 86 (01/23 0600) Resp:  [18] 18  (01/23 0600) BP: (126-151)/(56-77) 151/77 mmHg (01/23 0600) SpO2:  [94 %-99 %] 98 % (01/23 0600) Weight:  [159 lb 8 oz (72.349 kg)] 159 lb 8 oz (72.349 kg) (01/22 1339)   General:   Pleasant white female in NAD Head:  Normocephalic and atraumatic. Eyes:   No icterus.   Conjunctiva pink. Ears:  Normal auditory acuity. Neck:  Supple; no masses felt Lungs: Respirations even and unlabored. Lungs clear to auscultation bilaterally.   No wheezes, crackles, or rhonchi.  Heart:  Regular rate and rhythm Abdomen:  Soft, nondistended, nontender. Normal bowel sounds. No appreciable masses or hepatomegaly.  Msk:  Symmetrical without gross deformities.  Extremities:  Without edema. Neurologic:  Alert and  oriented x4;  grossly normal neurologically. Skin:  Intact without significant lesions or rashes. Cervical Nodes:  No significant cervical adenopathy. Psych:  Alert and cooperative. Normal affect.  LAB RESULTS:  Recent Labs  10/20/13 1318 10/20/13 2204 10/21/13 0555  WBC 12.6* 14.6* 15.0*  HGB 11.5* 10.8* 10.0*  HCT 34.2* 32.6* 30.9*  PLT 320.0 340 298   BMET  Recent Labs  10/19/13 2040 10/20/13 2204  NA 140 139  K 5.1 4.5  CL 105 104  CO2 24 25  GLUCOSE 119* 130*  BUN 9 13  CREATININE 0.52 0.49*  CALCIUM 8.6 9.0   LFT  Recent Labs  10/20/13 2204  PROT 7.2  ALBUMIN 3.5  AST 16  ALT 14  ALKPHOS 86  BILITOT 0.3    STUDIES: Dg Abd Acute W/chest  10/19/2013   CLINICAL DATA:  Status post colonoscopy, with rectal bleeding. Evaluate for free intra-abdominal air.  EXAM: ACUTE ABDOMEN SERIES (ABDOMEN 2 VIEW & CHEST 1 VIEW)  COMPARISON:  Pelvic radiograph performed 10/14/2011  FINDINGS: Mild left-sided volume loss is suggested, with nonspecific mild opacities at the left midlung zone. Underlying nodules cannot be excluded. The right lung appears grossly clear. The cardiomediastinal silhouette is borderline normal in size. Numerous clips are seen overlying the left axilla.  The  visualized bowel gas pattern is unremarkable. Scattered fluid and air are seen within the colon; there is no evidence of small bowel dilatation to suggest obstruction. No free intra-abdominal air is identified on the provided upright view.  No acute osseous abnormalities are seen; the sacroiliac joints are unremarkable in appearance.  IMPRESSION: 1. Unremarkable bowel gas pattern; no free intra-abdominal air seen. 2. Nonspecific mild focal opacities at the left midlung zone. Underlying nodules cannot be excluded. Suggestion of mild left-sided volume loss. CT of the chest would be helpful for further evaluation, on an elective nonemergent basis.   Electronically Signed   By: Roanna Raider M.D.   On: 10/19/2013 22:43    PREVIOUS ENDOSCOPIES:            colonoscopy 10/19/13, see HPI   Impression / Plan:   57. 78 year old female with post-polypectomy bleed. She is hemodynamically stable but still passing some red / dark red blood post bowel prep. Patient will need repeat colonoscopy today for control of bleeding. She completed bowel prep.   2. Anemia of acute blood loss. Hgb down to 10 from baseline of mid 14. Continue to monitor CBC, transfuse if necessary.   3. Hypothyroidism, on replacement therapy  4. Depression, treated.  5. Anxiety, takes Xanax sometimes at home. Requesting a Xanax now as she is nervous about bleeding/upcoming procedure. Will give small dose now.  Thanks   LOS:  1 day   Tye Savoy  10/21/2013, 9:17 AM  Attending MD note:   I have taken a history, examined the patient, and reviewed the chart. I agree with the Advanced Practitioner's impression and recommendations. Postpolypectomy bleed started within several hours of cecal polypectomy on 10/19/2013. Carpeted polyp, Hgb down from 14.0 to 12.1 to 10.0. Prepped for colonoscopy, still passing red and dark stool.  Off ASA x 3 days.  [facility] Pager # [phone]

## 2013-10-21 NOTE — Op Note (Signed)
[facility] [place], [address]   COLONOSCOPY PROCEDURE REPORT  PATIENT: Jillian Lynch, Jillian Lynch  MR#: [id number] BIRTHDATE: 1936-06-30 , 23  yrs. old GENDER: Female ENDOSCOPIST: Lafayette Dragon, MD REFERRED BY: Dr J.Pyrtle, Dr Sandi Carne PROCEDURE DATE:  10/21/2013 PROCEDURE:   Colonoscopy with control of bleeding and Submucosal injection, any substance ASA CLASS:   Class III INDICATIONS:post polypectomy bleed.  Colonoscopy on 10/19/2013 with polypectomy of a cough but did polyp in the cecum.  Hemoglobin has dropped from 14-10 g.  Patient continued to pass bloody stools, vital signs are stable. MEDICATIONS: These medications were titrated to patient response per physician's verbal order, Fentanyl 100 mcg IV, and Versed 7 mg IV   DESCRIPTION OF PROCEDURE:   After the risks and benefits and of the procedure were explained, informed consent was obtained.  A digital rectal exam revealed no abnormalities of the rectum.    The Pentax Ped Colon [id number]  endoscope was introduced through the anus and advanced to the cecum, which was identified by both the appendix and ileocecal valve .  The quality of the prep was poor.due to blood coating the colon mucosa .  The instrument was then slowly withdrawn as the colon was fully examined.     COLON FINDINGS: Moderate diverticulosis was noted throughout the entire examined colon. post-polypectomy site was identified without difficulty and was located at the ileocecal valve. There was a ulcerated surface of the polyp site with several adherent blood clots and actively oozing site at the edge of the ulcer. It did not pulsate. It was a slow steady ooze. The site was injected circumferentially with 1-10,000 solution of epinephrine 1-2 cc at a time to total of 10 cc; blanching of the mucosa followed. The bleeding stopped for short period of time but  then reappeared. At that point endoclips were placed over the area of  theoozing, first second and third clip were placed within the same i area all bleeding. The bleeding stopped again for while and then started to ooze slowly. We have reapplied epinephrine , additional 2 cc. At that point bleeding ceased .The rest of the post polypectomy site appeared ulcerated but there was no bleeding. Colon was then decompressed. Patient tolerated procedure well    Retroflexed views revealed no abnormalities.     The scope was then withdrawn from the patient and the procedure completed.  COMPLICATIONS: There were no complications. ENDOSCOPIC IMPRESSION: Moderate diverticulosis was noted throughout the entire examined colon s/p recent cecal polypectomy slow actively oozing post polypectomy site iat the ileocecal valve. Status post the epinephrine injection and placement of Endo Clip x3, with cessation of the bleeding at the end of the procedure. ` Moderate amount of blood filling the colon precluding adequate visualization of the rest of the colon  RECOMMENDATIONS: Notify interventional radiology of patient may potentially  needing arteriogram/ embolization  , Transfuse 2 units of packed cells Continue bowel rest Keep holding aspirin Check H&H every 4 hours  REPEAT EXAM: for Colonoscopy.pending path report from 10/19/2013  cc:  _______________________________ eSigned:  Lafayette Dragon, MD 10/21/2013 1:47 PM     PATIENT NAME:  Jillian Lynch, Jillian Lynch MR#: [id number]

## 2013-10-21 NOTE — Progress Notes (Signed)
UR completed 

## 2013-10-21 NOTE — Progress Notes (Signed)
Pt waited for tele bed assignment. Dr. Olevia Perches changed order for pt to be admitted to Step-down. Prior tele bed assigned. Pt and spouse informed. Nevin Bloodgood, NP contacted to provide informed transfusion consent for 2 units PRBC. Order for repeat CBC placed.  Waiting for step down bed assignment. Pt resting comfortably, denies pain. Understands plan.

## 2013-10-21 NOTE — Progress Notes (Signed)
Same day note. H and P from today reviewed. Agree with assessment and plan.Pt presents with GI bleed from polypectomy. S/p colonoscopy today. Results noted. Cont to follow h/h and transfuse as needed. Stable currently.

## 2013-10-21 NOTE — H&P (Addendum)
History and Physical    [place] C. [name] PNT:[id number] DOB: [birth year] DOA: 10/20/2013  Referring physician: Dr. Lita Mains PCP: Rica Mast, MD  Specialists: [facility]  Chief Complaint: gi bleed  HPI: Jillian Lynch is a 78 y.o. female has a past medical history significant for hypertension, hyperlipidemia, hypothyroidism, presents to the emergency room with a chief complaint of bright red blood per rectum. She had a screening colonoscopy done 2 days ago the morning and C7 she has been having constant bright red blood per rectum. She came into the ED yesterday, and again today since this is persistent. She denies any chest pain, shortness of breath, lightheadedness or dizziness, she denies any abdominal pain nausea vomiting or diarrhea. She never had symptoms like this in the past. She tells me that during her colonoscopy she had 2 polyps removed. She is taking aspirin every day, and she took aspirin the night prior to her colonoscopy. She hasn't taken any aspirin since. In the emergency room, hemoglobin is 10.8 from 12.92 days ago. She endorses weakness.  Review of Systems: As per history of present illness otherwise negative.  Past Medical History  Diagnosis Date  . HTN (hypertension)   . Hyperlipidemia   . Anxiety   . Herpes zoster     Left arm 2007  . Breast cancer 2006    Dr Lucile Crater, left breast, had chemo,mastectomy and radiation  . Basal cell carcinoma     Left eyebrow  . Thyroid disease   . Insomnia   . Vertigo    Past Surgical History  Procedure Laterality Date  . Shoulder surgery Right   . Mastectomy, radical Left 2007   Social History:  reports that she has never smoked. She has never used smokeless tobacco. She reports that she drinks alcohol. She reports that she does not use illicit drugs.  Allergies  Allergen Reactions  . Tape Rash    adhesive    Family History  Problem Relation Age of Onset  . Heart disease Mother   . Heart disease Father   .  Lung cancer Sister   . Colon cancer Other 21  . Lung cancer Brother   . Heart disease Brother   . Heart disease Sister   . Stroke Sister    Prior to Admission medications   Medication Sig Start Date End Date Taking? Authorizing Provider  acetaminophen (TYLENOL) 325 MG tablet Take 325 mg by mouth every 6 (six) hours as needed for mild pain.    Yes Historical Provider, MD  ALPRAZolam Duanne Moron) 0.5 MG tablet Take 0.5 mg by mouth 3 (three) times daily as needed for anxiety.   Yes Historical Provider, MD  atorvastatin (LIPITOR) 20 MG tablet Take 20 mg by mouth daily.   Yes Historical Provider, MD  cholecalciferol (VITAMIN D) 1000 UNITS tablet Take 1,000 Units by mouth daily.   Yes Historical Provider, MD  enalapril (VASOTEC) 10 MG tablet Take 10 mg by mouth daily.   Yes Historical Provider, MD  fish oil-omega-3 fatty acids 1000 MG capsule Take 2 g by mouth daily.     Yes Historical Provider, MD  fluticasone (FLONASE) 50 MCG/ACT nasal spray Place 1 spray into both nostrils daily as needed for allergies or rhinitis.   Yes Historical Provider, MD  gabapentin (NEURONTIN) 300 MG capsule Take 300 mg by mouth 2 (two) times daily.   Yes Historical Provider, MD  levothyroxine (SYNTHROID, LEVOTHROID) 75 MCG tablet Take 75 mcg by mouth daily before breakfast.  Yes Historical Provider, MD  omeprazole (PRILOSEC) 20 MG capsule Take 20 mg by mouth daily.     Yes Historical Provider, MD  sertraline (ZOLOFT) 50 MG tablet Take 50 mg by mouth at bedtime.   Yes Historical Provider, MD  aspirin EC 81 MG tablet Take 81 mg by mouth daily.      Historical Provider, MD  meloxicam (MOBIC) 15 MG tablet Take 15 mg by mouth daily as needed for pain.    Historical Provider, MD   Physical Exam: Filed Vitals:   10/20/13 2115  BP: 126/64  Pulse: 100  Temp: 98.4 F (36.9 C)  TempSrc: Oral  Resp: 18  SpO2: 96%     General:  No apparent distress  Eyes: PERRL, EOMI, no scleral icterus  ENT: moist oropharynx  Neck:  supple, no JVD  Cardiovascular: regular rate without MRG; 2+ peripheral pulses  Respiratory: CTA biL, good air movement without wheezing, rhonchi or crackled  Abdomen: soft, non tender to palpation, positive bowel sounds, no guarding, no rebound  Skin: no rashes  Musculoskeletal: no peripheral edema  Psychiatric: normal mood and affect  Neurologic: Nonfocal  Labs on Admission:  Basic Metabolic Panel:  Recent Labs Lab 10/19/13 2040 10/20/13 2204  NA 140 139  K 5.1 4.5  CL 105 104  CO2 24 25  GLUCOSE 119* 130*  BUN 9 13  CREATININE 0.52 0.49*  CALCIUM 8.6 9.0   Liver Function Tests:  Recent Labs Lab 10/19/13 2040 10/20/13 2204  AST 16 16  ALT 14 14  ALKPHOS 90 86  BILITOT 0.2* 0.3  PROT 7.3 7.2  ALBUMIN 3.5 3.5   CBC:  Recent Labs Lab 10/19/13 2040 10/20/13 1318 10/20/13 2204  WBC 14.0* 12.6* 14.6*  NEUTROABS 12.2* 10.2*  --   HGB 12.9 11.5* 10.8*  HCT 39.6 34.2* 32.6*  MCV 91.0 87.9 89.1  PLT 307 320.0 340   Radiological Exams on Admission: Dg Abd Acute W/chest  10/19/2013   CLINICAL DATA:  Status post colonoscopy, with rectal bleeding. Evaluate for free intra-abdominal air.  EXAM: ACUTE ABDOMEN SERIES (ABDOMEN 2 VIEW & CHEST 1 VIEW)  COMPARISON:  Pelvic radiograph performed 10/14/2011  FINDINGS: Mild left-sided volume loss is suggested, with nonspecific mild opacities at the left midlung zone. Underlying nodules cannot be excluded. The right lung appears grossly clear. The cardiomediastinal silhouette is borderline normal in size. Numerous clips are seen overlying the left axilla.  The visualized bowel gas pattern is unremarkable. Scattered fluid and air are seen within the colon; there is no evidence of small bowel dilatation to suggest obstruction. No free intra-abdominal air is identified on the provided upright view.  No acute osseous abnormalities are seen; the sacroiliac joints are unremarkable in appearance.  IMPRESSION: 1. Unremarkable bowel gas  pattern; no free intra-abdominal air seen. 2. Nonspecific mild focal opacities at the left midlung zone. Underlying nodules cannot be excluded. Suggestion of mild left-sided volume loss. CT of the chest would be helpful for further evaluation, on an elective nonemergent basis.   Electronically Signed   By: Garald Balding M.D.   On: 10/19/2013 22:43    EKG: Independently reviewed.  Assessment/Plan Principal Problem:   GI bleeding Active Problems:   Hypertension   Hyperlipidemia LDL goal < 100   Hypothyroidism   Anxiety   GI bleed  GI bleed - I spoke with Dr. Ardis Hughs over the phone it seems like she will need a repeat colonoscopy in the morning. I will put in for GoLYTELY  overnight. Her hemoglobin is stable and she does not require transfusions for now. Monitor on telemetry overnight. Continue Protonix. Repeat CBC in am. Hold Aspirin.  Hypothyroidism - continue Synthroid Hyperlipidemia - continue statin Anxiety - continue Xanax as needed Depression - continue Zoloft  Diet: N.p.o. Fluids: Normal saline DVT Prophylaxis: SCDs  Code Status: Full code  Family Communication: Husband at bedside  Disposition Plan: Inpatient  Time spent: [place]. Cruzita Lederer, MD [facility] Pager [phone]  If 7PM-7AM, please contact night-coverage [email or web address] Password [facility] 10/21/2013, 12:52 AM

## 2013-10-21 NOTE — Telephone Encounter (Signed)
lmom for pt to call back. Checking on her condition and she needs a f/u appt.

## 2013-10-22 DIAGNOSIS — D62 Acute posthemorrhagic anemia: Secondary | ICD-10-CM

## 2013-10-22 DIAGNOSIS — F411 Generalized anxiety disorder: Secondary | ICD-10-CM

## 2013-10-22 DIAGNOSIS — F4323 Adjustment disorder with mixed anxiety and depressed mood: Secondary | ICD-10-CM

## 2013-10-22 DIAGNOSIS — D126 Benign neoplasm of colon, unspecified: Secondary | ICD-10-CM

## 2013-10-22 DIAGNOSIS — K633 Ulcer of intestine: Secondary | ICD-10-CM

## 2013-10-22 LAB — CBC
HCT: 30.1 % — ABNORMAL LOW (ref 36.0–46.0)
Hemoglobin: 9.8 g/dL — ABNORMAL LOW (ref 12.0–15.0)
MCH: 28.3 pg (ref 26.0–34.0)
MCHC: 32.6 g/dL (ref 30.0–36.0)
MCV: 87 fL (ref 78.0–100.0)
Platelets: 212 10*3/uL (ref 150–400)
RBC: 3.46 MIL/uL — ABNORMAL LOW (ref 3.87–5.11)
RDW: 15.2 % (ref 11.5–15.5)
WBC: 12.7 10*3/uL — AB (ref 4.0–10.5)

## 2013-10-22 LAB — HEMOGLOBIN AND HEMATOCRIT, BLOOD
HCT: 31.3 % — ABNORMAL LOW (ref 36.0–46.0)
HEMATOCRIT: 33.9 % — AB (ref 36.0–46.0)
HEMOGLOBIN: 11.1 g/dL — AB (ref 12.0–15.0)
Hemoglobin: 10.1 g/dL — ABNORMAL LOW (ref 12.0–15.0)

## 2013-10-22 NOTE — Progress Notes (Signed)
[facility] PROGRESS NOTE  Jillian C. [name] TIW:[id number] DOB: 1936-07-11 DOA: 10/20/2013 PCP: Rica Mast, MD  Assessment/Plan: GI bleed - GI following. Post-polypectomy bleed. Pt is s/p repeat endoscopy and has received PRBC tx overnight with good results. Thus far stable. Hypothyroidism - continue Synthroid. Stable  Hyperlipidemia - continue statin  Anxiety - continue Xanax as needed  Depression - continue Zoloft  Code Status: Full Family Communication: Pt in room (indicate person spoken with, relationship, and if by phone, the number) Disposition Plan: Pending  Consultants:  GI  Procedures:  Endoscopy 10/21/13  HPI/Subjective: No acute events noted overnight  Objective: Filed Vitals:   10/21/13 2105 10/21/13 2205 10/21/13 2329 10/22/13 0355  BP: 124/90 149/48 160/46 139/55  Pulse:  84 90 87  Temp: 95.5 F (35.3 C) 98.5 F (36.9 C) 98.5 F (36.9 C) 98.1 F (36.7 C)  TempSrc:      Resp:  15 17 19   Height:      Weight:      SpO2:  98% 96% 98%    Intake/Output Summary (Last 24 hours) at 10/22/13 0804 Last data filed at 10/21/13 2300  Gross per 24 hour  Intake    735 ml  Output   2700 ml  Net  -1965 ml   Filed Weights   10/21/13 1600  Weight: 72.3 kg (159 lb 6.3 oz)    Exam:   General:  Awake, in nad  Cardiovascular: regular, s1, s2  Respiratory: normal resp effort, no wheezing  Abdomen: soft, nondistended  Musculoskeletal: perfused, no clubbing   Data Reviewed: Basic Metabolic Panel:  Recent Labs Lab 10/19/13 2040 10/20/13 2204  NA 140 139  K 5.1 4.5  CL 105 104  CO2 24 25  GLUCOSE 119* 130*  BUN 9 13  CREATININE 0.52 0.49*  CALCIUM 8.6 9.0   Liver Function Tests:  Recent Labs Lab 10/19/13 2040 10/20/13 2204  AST 16 16  ALT 14 14  ALKPHOS 90 86  BILITOT 0.2* 0.3  PROT 7.3 7.2  ALBUMIN 3.5 3.5   No results found for this basename: LIPASE, AMYLASE,  in the last 168 hours No results found for this basename:  AMMONIA,  in the last 168 hours CBC:  Recent Labs Lab 10/19/13 2040 10/20/13 1318 10/20/13 2204 10/21/13 0555 10/21/13 1559 10/22/13 0355  WBC 14.0* 12.6* 14.6* 15.0* 19.8* 12.7*  NEUTROABS 12.2* 10.2*  --   --   --   --   HGB 12.9 11.5* 10.8* 10.0* 8.1* 9.8*  HCT 39.6 34.2* 32.6* 30.9* 24.3* 30.1*  MCV 91.0 87.9 89.1 89.8 90.0 87.0  PLT 307 320.0 340 298 276 212   Cardiac Enzymes: No results found for this basename: CKTOTAL, CKMB, CKMBINDEX, TROPONINI,  in the last 168 hours BNP (last 3 results) No results found for this basename: PROBNP,  in the last 8760 hours CBG: No results found for this basename: GLUCAP,  in the last 168 hours  Recent Results (from the past 240 hour(s))  MRSA PCR SCREENING     Status: None   Collection Time    10/21/13  4:10 PM      Result Value Range Status   MRSA by PCR NEGATIVE  NEGATIVE Final   Comment:            The GeneXpert MRSA Assay (FDA     approved for NASAL specimens     only), is one component of a     comprehensive MRSA colonization     surveillance  program. It is not     intended to diagnose MRSA     infection nor to guide or     monitor treatment for     MRSA infections.     Studies: No results found.  Scheduled Meds: . atorvastatin  20 mg Oral q1800  . gabapentin  300 mg Oral BID  . levothyroxine  75 mcg Oral QAC breakfast  . pantoprazole  40 mg Oral Daily  . sertraline  50 mg Oral QHS  . sodium chloride  3 mL Intravenous Q12H   Continuous Infusions:   Principal Problem:   GI bleeding Active Problems:   Hypertension   Hyperlipidemia LDL goal < 100   Hypothyroidism   Anxiety   GI bleed   Hemorrhage of gastrointestinal tract, unspecified  Time spent: 55min  Dayton Kenley, [facility] Pager [phone]. If 7PM-7AM, please contact night-coverage at [email or web address], password [facility] 10/22/2013, 8:04 AM  LOS: 2 days

## 2013-10-22 NOTE — Progress Notes (Signed)
HISTORY OF PRESENT ILLNESS:  Jillian Lynch. Jillian Lynch is a 78 y.o. female who presented with post polypectomy bleed. Underwent colonoscopy with epinephrine injection and Endo Clip being of the right colon, actively bleeding, lesion. Reviewed. 2 BMs since. Hemodynamically stable. Interval laboratory data reviewed.. Appropriate rise in hemoglobin with transfusion. Hungry but otherwise no complaints. Daughter in room.  REVIEW OF SYSTEMS:  All non-GI ROS negative except for mild fatigue  Past Medical History  Diagnosis Date  . HTN (hypertension)   . Hyperlipidemia   . Anxiety   . Herpes zoster     Left arm 2007  . Breast cancer 2006    Dr Lucile Crater, left breast, had chemo,mastectomy and radiation  . Basal cell carcinoma     Left eyebrow  . Thyroid disease   . Insomnia   . Vertigo     Past Surgical History  Procedure Laterality Date  . Shoulder surgery Right   . Mastectomy, radical Left 2007    Social History Jillian Lynch. Jillian Lynch  reports that she has never smoked. She has never used smokeless tobacco. She reports that she drinks alcohol. She reports that she does not use illicit drugs.  family history includes Colon cancer (age of onset: 1) in her other; Heart disease in her brother, father, mother, and sister; Lung cancer in her brother and sister; Stroke in her sister.  Allergies  Allergen Reactions  . Tape Rash    adhesive       PHYSICAL EXAMINATION: Vital signs: BP 140/37  Pulse 79  Temp(Src) 98.2 F (36.8 C) (Oral)  Resp 17  Ht 4' 10.5" (1.486 m)  Wt 159 lb 6.3 oz (72.3 kg)  BMI 32.74 kg/m2  SpO2 93% General: Well-developed, well-nourished, no acute distress HEENT: Sclerae are anicteric, conjunctiva pink. Oral mucosa intact Lungs: Clear Heart: Regular Abdomen: soft, nontender, nondistended, no obvious ascites, no peritoneal signs, normal bowel sounds. No organomegaly. Extremities: No edema Psychiatric: alert and oriented x3. Cooperative     ASSESSMENT:  #1. Post  polypectomy bleed. Status post endoscopic hemostatic therapy. Stable at present #2. Large right colon polyp. Pathology pending #3. Posthemorrhagic anemia.   PLAN:  #1. Continue close observation. Monitor stools and hemoglobin. #2. Await pathology #3. Transfuse as needed  Docia Chuck. Geri Seminole., M.D. [facility] Division of Gastroenterology

## 2013-10-22 NOTE — Progress Notes (Signed)
Patient had dark red liquid stool measuring 200 ml . Will continue to monitor.

## 2013-10-23 LAB — TYPE AND SCREEN
ABO/RH(D): O NEG
Antibody Screen: NEGATIVE
UNIT DIVISION: 0
Unit division: 0

## 2013-10-23 LAB — CBC
HCT: 31.1 % — ABNORMAL LOW (ref 36.0–46.0)
Hemoglobin: 10.4 g/dL — ABNORMAL LOW (ref 12.0–15.0)
MCH: 29.5 pg (ref 26.0–34.0)
MCHC: 33.4 g/dL (ref 30.0–36.0)
MCV: 88.4 fL (ref 78.0–100.0)
PLATELETS: 256 10*3/uL (ref 150–400)
RBC: 3.52 MIL/uL — AB (ref 3.87–5.11)
RDW: 15 % (ref 11.5–15.5)
WBC: 11.2 10*3/uL — ABNORMAL HIGH (ref 4.0–10.5)

## 2013-10-23 NOTE — Progress Notes (Signed)
[facility] PROGRESS NOTE  Jillian Lynch JOA:[id number] DOB: [birth year] DOA: 10/20/2013 PCP: Rica Mast, MD  Assessment/Plan: GI bleed - GI following. Post-polypectomy bleed. Pt is s/p repeat endoscopy. Hgb has remained stable post-prbc transfusion. Had noted continued BRBPR. Will defer mgt to GI. Hypothyroidism - continue Synthroid. Stable  Hyperlipidemia - continue statin  Anxiety - continue Xanax as needed  Depression - continue Zoloft  Code Status: Full Family Communication: Pt in room (indicate person spoken with, relationship, and if by phone, the number) Disposition Plan: Pending  Consultants:  GI  Procedures:  Endoscopy 10/21/13  HPI/Subjective: No acute events noted  Objective: Filed Vitals:   10/22/13 1600 10/22/13 1700 10/22/13 2203 10/23/13 0532  BP: 109/65 120/69 113/68 110/62  Pulse: 68 77 73 75  Temp:  97.9 F (36.6 C) 98.4 F (36.9 C) 98.2 F (36.8 C)  TempSrc:  Oral Oral Oral  Resp: 15 18 18 18   Height:      Weight:  74.5 kg (164 lb 3.9 oz)    SpO2: 98% 97% 93% 94%    Intake/Output Summary (Last 24 hours) at 10/23/13 0840 Last data filed at 10/22/13 1831  Gross per 24 hour  Intake    723 ml  Output   2800 ml  Net  -2077 ml   Filed Weights   10/21/13 1600 10/22/13 1700  Weight: 72.3 kg (159 lb 6.3 oz) 74.5 kg (164 lb 3.9 oz)    Exam:   General:  Awake, in nad  Cardiovascular: regular, s1, s2  Respiratory: normal resp effort, no wheezing  Abdomen: soft, nondistended  Musculoskeletal: perfused, no clubbing   Data Reviewed: Basic Metabolic Panel:  Recent Labs Lab 10/19/13 2040 10/20/13 2204  NA 140 139  K 5.1 4.5  CL 105 104  CO2 24 25  GLUCOSE 119* 130*  BUN 9 13  CREATININE 0.52 0.49*  CALCIUM 8.6 9.0   Liver Function Tests:  Recent Labs Lab 10/19/13 2040 10/20/13 2204  AST 16 16  ALT 14 14  ALKPHOS 90 86  BILITOT 0.2* 0.3  PROT 7.3 7.2  ALBUMIN 3.5 3.5   No results found for this basename:  LIPASE, AMYLASE,  in the last 168 hours No results found for this basename: AMMONIA,  in the last 168 hours CBC:  Recent Labs Lab 10/19/13 2040 10/20/13 1318 10/20/13 2204 10/21/13 0555 10/21/13 1559 10/22/13 0355 10/22/13 1703 10/22/13 2208  WBC 14.0* 12.6* 14.6* 15.0* 19.8* 12.7*  --   --   NEUTROABS 12.2* 10.2*  --   --   --   --   --   --   HGB 12.9 11.5* 10.8* 10.0* 8.1* 9.8* 11.1* 10.1*  HCT 39.6 34.2* 32.6* 30.9* 24.3* 30.1* 33.9* 31.3*  MCV 91.0 87.9 89.1 89.8 90.0 87.0  --   --   PLT 307 320.0 340 298 276 212  --   --    Cardiac Enzymes: No results found for this basename: CKTOTAL, CKMB, CKMBINDEX, TROPONINI,  in the last 168 hours BNP (last 3 results) No results found for this basename: PROBNP,  in the last 8760 hours CBG: No results found for this basename: GLUCAP,  in the last 168 hours  Recent Results (from the past 240 hour(s))  MRSA PCR SCREENING     Status: None   Collection Time    10/21/13  4:10 PM      Result Value Range Status   MRSA by PCR NEGATIVE  NEGATIVE Final   Comment:  The GeneXpert MRSA Assay (FDA     approved for NASAL specimens     only), is one component of a     comprehensive MRSA colonization     surveillance program. It is not     intended to diagnose MRSA     infection nor to guide or     monitor treatment for     MRSA infections.     Studies: No results found.  Scheduled Meds: . atorvastatin  20 mg Oral q1800  . gabapentin  300 mg Oral BID  . levothyroxine  75 mcg Oral QAC breakfast  . pantoprazole  40 mg Oral Daily  . sertraline  50 mg Oral QHS  . sodium chloride  3 mL Intravenous Q12H   Continuous Infusions:   Principal Problem:   GI bleeding Active Problems:   Hypertension   Hyperlipidemia LDL goal < 100   Hypothyroidism   Anxiety   GI bleed   Hemorrhage of gastrointestinal tract, unspecified  Time spent: 55min  Emali Heyward, [facility] Pager [phone]. If 7PM-7AM, please contact  night-coverage at [email or web address], password [facility] 10/23/2013, 8:40 AM  LOS: 3 days

## 2013-10-23 NOTE — Progress Notes (Signed)
HISTORY OF PRESENT ILLNESS:  Jillian Lynch is a 78 y.o. female who was admitted with post polypectomy bleed. Doing well since I saw her yesterday. No bleeding. No other complaints except for probably stomach. Tolerating limited diet. Hemoglobin stable  REVIEW OF SYSTEMS:  All non-GI ROS negative except for  Past Medical History  Diagnosis Date  . HTN (hypertension)   . Hyperlipidemia   . Anxiety   . Herpes zoster     Left arm 2007  . Breast cancer 2006    Dr Lucile Crater, left breast, had chemo,mastectomy and radiation  . Basal cell carcinoma     Left eyebrow  . Thyroid disease   . Insomnia   . Vertigo     Past Surgical History  Procedure Laterality Date  . Shoulder surgery Right   . Mastectomy, radical Left 2007    Social History Jillian Lynch  reports that she has never smoked. She has never used smokeless tobacco. She reports that she drinks alcohol. She reports that she does not use illicit drugs.  family history includes Colon cancer (age of onset: 90) in her other; Heart disease in her brother, father, mother, and sister; Lung cancer in her brother and sister; Stroke in her sister.  Allergies  Allergen Reactions  . Tape Rash    adhesive       PHYSICAL EXAMINATION: Vital signs: BP 110/66  Pulse 71  Temp(Src) 97.9 F (36.6 C) (Oral)  Resp 18  Ht 4' 10.5" (1.486 m)  Wt 164 lb 3.9 oz (74.5 kg)  BMI 33.74 kg/m2  SpO2 96% General: Well-developed, well-nourished, no acute distress HEENT: Sclerae are anicteric, conjunctiva pink. Oral mucosa intact Lungs: Clear Heart: Regular Abdomen: soft, nontender, nondistended, no obvious ascites, no peritoneal signs, normal bowel sounds. No organomegaly. Extremities: No edema Psychiatric: alert and oriented x3. Cooperative     ASSESSMENT:  1. Postoperative bleed. Status post endoscopic hemostatic therapy. Currently doing well without evidence of rebleed. 2. Anemia secondary to blood loss. Hemoglobin stable 3. Large  adenomatous appearing polyp. Pathology pending   PLAN:  1. Advance diet 2. Continue to observe stools and hemoglobin 3. Could possibly be discharged home tomorrow if she remains stable without evidence of rebleeding  Docia Chuck. Geri Seminole., M.D. [facility] Division of Gastroenterology

## 2013-10-24 ENCOUNTER — Encounter (HOSPITAL_COMMUNITY): Payer: Self-pay | Admitting: Internal Medicine

## 2013-10-24 LAB — CBC
HEMATOCRIT: 32.4 % — AB (ref 36.0–46.0)
Hemoglobin: 10.7 g/dL — ABNORMAL LOW (ref 12.0–15.0)
MCH: 29.5 pg (ref 26.0–34.0)
MCHC: 33 g/dL (ref 30.0–36.0)
MCV: 89.3 fL (ref 78.0–100.0)
PLATELETS: 277 10*3/uL (ref 150–400)
RBC: 3.63 MIL/uL — ABNORMAL LOW (ref 3.87–5.11)
RDW: 14.8 % (ref 11.5–15.5)
WBC: 11.1 10*3/uL — ABNORMAL HIGH (ref 4.0–10.5)

## 2013-10-24 NOTE — Discharge Summary (Signed)
Physician Discharge Summary  Jillian Lynch. Jillian Lynch FTD:[id number] DOB: [birth year] DOA: 10/20/2013  PCP: Rica Mast, MD  Admit date: 10/20/2013 Discharge date: 10/24/2013  Time spent: 35 minutes  Recommendations for Outpatient Follow-up:  1. Follow up with PCP in 1-2 weeks 2. Follow up with GI as needed 3. Repeat CBC in 1-2 weeks  Discharge Diagnoses:  Principal Problem:   GI bleeding Active Problems:   Hypertension   Hyperlipidemia LDL goal < 100   Hypothyroidism   Anxiety   GI bleed   Hemorrhage of gastrointestinal tract, unspecified   Discharge Condition: Stable  Diet recommendation: Regular  Filed Weights   10/21/13 1600 10/22/13 1700  Weight: 72.3 kg (159 lb 6.3 oz) 74.5 kg (164 lb 3.9 oz)    History of present illness:  Jillian Lynch is a 78 y.o. female has a past medical history significant for hypertension, hyperlipidemia, hypothyroidism, presents to the emergency room with a chief complaint of bright red blood per rectum. She had a screening colonoscopy done 2 days ago the morning and C7 she has been having constant bright red blood per rectum. She came into the ED yesterday, and again today since this is persistent. She denies any chest pain, shortness of breath, lightheadedness or dizziness, she denies any abdominal pain nausea vomiting or diarrhea. She never had symptoms like this in the past. She tells me that during her colonoscopy she had 2 polyps removed. She is taking aspirin every day, and she took aspirin the night prior to her colonoscopy. She hasn't taken any aspirin since. In the emergency room, hemoglobin is 10.8 from 12.92 days ago. She endorses weakness.  Hospital Course:  GI bleed - GI following. Post-polypectomy bleed. Pt is s/p repeat endoscopy. Hgb had remained stable post-prbc transfusion. Stable for discharge per GI. Pt to follow up closely as an outpatient. Hypothyroidism - continued Synthroid. Stable  Hyperlipidemia - continued statin  Anxiety -  continued Xanax as needed  Depression - continued Zoloft  Procedures:  Colonoscopy 10/21/13  Consultations:  Gastroenterology  Discharge Exam: Filed Vitals:   10/23/13 0532 10/23/13 1500 10/23/13 2218 10/24/13 0537  BP: 110/62 110/66 104/65 105/61  Pulse: 75 71 77 74  Temp: 98.2 F (36.8 C) 97.9 F (36.6 C) 98.3 F (36.8 C) 98.6 F (37 C)  TempSrc: Oral Oral Oral Oral  Resp: 18 18 18 18   Height:      Weight:      SpO2: 94% 96% 96% 95%    General: Awake, in nad Cardiovascular: regular, s1, s2 Respiratory: normal resp effort, no wheezing  Discharge Instructions       Future Appointments Provider Department Dept Phone   11/07/2013 11:00 AM Jerene Bears, MD [facility] [phone]   11/11/2013 2:15 PM Jackolyn Confer, MD [facility] [phone]   04/13/2014 1:30 PM Jackolyn Confer, MD [facility] [phone]       Medication List         acetaminophen 325 MG tablet  Commonly known as:  TYLENOL  Take 325 mg by mouth every 6 (six) hours as needed for mild pain.     ALPRAZolam 0.5 MG tablet  Commonly known as:  XANAX  Take 0.5 mg by mouth 3 (three) times daily as needed for anxiety.     aspirin EC 81 MG tablet  Take 81 mg by mouth daily.     atorvastatin 20 MG tablet  Commonly known as:  LIPITOR  Take 20 mg by  mouth daily.     cholecalciferol 1000 UNITS tablet  Commonly known as:  VITAMIN D  Take 1,000 Units by mouth daily.     enalapril 10 MG tablet  Commonly known as:  VASOTEC  Take 10 mg by mouth daily.     fish oil-omega-3 fatty acids 1000 MG capsule  Take 2 g by mouth daily.     fluticasone 50 MCG/ACT nasal spray  Commonly known as:  FLONASE  Place 1 spray into both nostrils daily as needed for allergies or rhinitis.     gabapentin 300 MG capsule  Commonly known as:  NEURONTIN  Take 300 mg by mouth 2 (two) times daily.     levothyroxine 75 MCG tablet  Commonly known as:   SYNTHROID, LEVOTHROID  Take 75 mcg by mouth daily before breakfast.     meloxicam 15 MG tablet  Commonly known as:  MOBIC  Take 15 mg by mouth daily as needed for pain.     omeprazole 20 MG capsule  Commonly known as:  PRILOSEC  Take 20 mg by mouth daily.     sertraline 50 MG tablet  Commonly known as:  ZOLOFT  Take 50 mg by mouth at bedtime.       Allergies  Allergen Reactions  . Tape Rash    adhesive   Follow-up Information   Follow up with Rica Mast, MD. Schedule an appointment as soon as possible for a visit in 1 week.   Specialty:  Internal Medicine   Contact information:   [id number] [place] [address] [phone]      The results of significant diagnostics from this hospitalization (including imaging, microbiology, ancillary and laboratory) are listed below for reference.    Significant Diagnostic Studies: Dg Abd Acute W/chest  10/19/2013   CLINICAL DATA:  Status post colonoscopy, with rectal bleeding. Evaluate for free intra-abdominal air.  EXAM: ACUTE ABDOMEN SERIES (ABDOMEN 2 VIEW & CHEST 1 VIEW)  COMPARISON:  Pelvic radiograph performed 10/14/2011  FINDINGS: Mild left-sided volume loss is suggested, with nonspecific mild opacities at the left midlung zone. Underlying nodules cannot be excluded. The right lung appears grossly clear. The cardiomediastinal silhouette is borderline normal in size. Numerous clips are seen overlying the left axilla.  The visualized bowel gas pattern is unremarkable. Scattered fluid and air are seen within the colon; there is no evidence of small bowel dilatation to suggest obstruction. No free intra-abdominal air is identified on the provided upright view.  No acute osseous abnormalities are seen; the sacroiliac joints are unremarkable in appearance.  IMPRESSION: 1. Unremarkable bowel gas pattern; no free intra-abdominal air seen. 2. Nonspecific mild focal opacities at the left midlung zone. Underlying  nodules cannot be excluded. Suggestion of mild left-sided volume loss. CT of the chest would be helpful for further evaluation, on an elective nonemergent basis.   Electronically Signed   By: Garald Balding M.D.   On: 10/19/2013 22:43    Microbiology: Recent Results (from the past 240 hour(s))  MRSA PCR SCREENING     Status: None   Collection Time    10/21/13  4:10 PM      Result Value Range Status   MRSA by PCR NEGATIVE  NEGATIVE Final   Comment:            The GeneXpert MRSA Assay (FDA     approved for NASAL specimens     only), is one component of a     comprehensive MRSA colonization  surveillance program. It is not     intended to diagnose MRSA     infection nor to guide or     monitor treatment for     MRSA infections.     Labs: Basic Metabolic Panel:  Recent Labs Lab 10/19/13 2040 10/20/13 2204  NA 140 139  K 5.1 4.5  CL 105 104  CO2 24 25  GLUCOSE 119* 130*  BUN 9 13  CREATININE 0.52 0.49*  CALCIUM 8.6 9.0   Liver Function Tests:  Recent Labs Lab 10/19/13 2040 10/20/13 2204  AST 16 16  ALT 14 14  ALKPHOS 90 86  BILITOT 0.2* 0.3  PROT 7.3 7.2  ALBUMIN 3.5 3.5   No results found for this basename: LIPASE, AMYLASE,  in the last 168 hours No results found for this basename: AMMONIA,  in the last 168 hours CBC:  Recent Labs Lab 10/19/13 2040 10/20/13 1318  10/21/13 0555 10/21/13 1559 10/22/13 0355 10/22/13 1703 10/22/13 2208 10/23/13 1010 10/24/13 0803  WBC 14.0* 12.6*  < > 15.0* 19.8* 12.7*  --   --  11.2* 11.1*  NEUTROABS 12.2* 10.2*  --   --   --   --   --   --   --   --   HGB 12.9 11.5*  < > 10.0* 8.1* 9.8* 11.1* 10.1* 10.4* 10.7*  HCT 39.6 34.2*  < > 30.9* 24.3* 30.1* 33.9* 31.3* 31.1* 32.4*  MCV 91.0 87.9  < > 89.8 90.0 87.0  --   --  88.4 89.3  PLT 307 320.0  < > 298 276 212  --   --  256 277  < > = values in this interval not displayed. Cardiac Enzymes: No results found for this basename: CKTOTAL, CKMB, CKMBINDEX, TROPONINI,  in  the last 168 hours BNP: BNP (last 3 results) No results found for this basename: PROBNP,  in the last 8760 hours CBG: No results found for this basename: GLUCAP,  in the last 168 hours  Signed:  Assia Meanor K  [facility] 10/24/2013, 12:23 PM

## 2013-10-24 NOTE — Progress Notes (Signed)
Sleeping easily.  No c/o pain or distress.  Up ad lib to bathroom.  Call bell in reach

## 2013-10-24 NOTE — Progress Notes (Signed)
Progress Note   Subjective  Feels okay. No bleeding. Eating solids.    Objective   Vital signs in last 24 hours: Temp:  [97.9 F (36.6 C)-98.6 F (37 C)] 98.6 F (37 C) (01/26 0537) Pulse Rate:  [71-77] 74 (01/26 0537) Resp:  [18] 18 (01/26 0537) BP: (104-110)/(61-66) 105/61 mmHg (01/26 0537) SpO2:  [95 %-96 %] 95 % (01/26 0537) Last BM Date: 10/23/13 General:    white female in NAD Heart:  Regular rate and rhythm Lungs: Respirations even and unlabored, lungs CTA bilaterally Abdomen:  Soft, nontender and nondistended. Normal bowel sounds. Extremities:  Without edema. Neurologic:  Alert and oriented,  grossly normal neurologically. Psych:  Cooperative. Normal mood and affect.    Lab Results:  Recent Labs  10/22/13 0355  10/22/13 2208 10/23/13 1010 10/24/13 0803  WBC 12.7*  --   --  11.2* 11.1*  HGB 9.8*  < > 10.1* 10.4* 10.7*  HCT 30.1*  < > 31.3* 31.1* 32.4*  PLT 212  --   --  256 277  < > = values in this interval not displayed.  Studies/Results: Colonoscopy 10/21/13: COLON FINDINGS: Moderate diverticulosis was noted throughout the  entire examined colon. post-polypectomy site was identified without  difficulty and was located at the ileocecal valve. There was a  ulcerated surface of the polyp site with several adherent blood  clots and actively oozing site at the edge of the ulcer. It did not  pulsate. It was a slow steady ooze. The site was injected  circumferentially with 1-10,000 solution of epinephrine 1-2 cc at a  time to total of 10 cc; blanching of the mucosa followed. The  bleeding stopped for short period of time but then reappeared. At  that point endoclips were placed over the area of theoozing, first  second and third clip were placed within the same i area all  bleeding. The bleeding stopped again for while and then started to  ooze slowly. We have reapplied epinephrine , additional 2 cc. At  that point bleeding ceased .The rest of the post  polypectomy site  appeared ulcerated but there was no bleeding. Colon was then  decompressed. Patient tolerated procedure well Retroflexed views  revealed no abnormalities. The scope was then withdrawn from  the patient and the procedure completed.  COMPLICATIONS:There were no complications.  ENDOSCOPIC IMPRESSION:  Moderate diverticulosis was noted throughout the entire examined  colon  s/p recent cecal polypectomy  slow actively oozing post polypectomy site iat the ileocecal valve.  Status post the epinephrine injection and placement of Endo Clip  x3, with cessation of the bleeding at the end of the procedure. `  Moderate amount of blood filling the colon precluding adequate  visualization of the rest of the colon      Assessment / Plan:   67. 78 year old female with post-polypectomy bleed, s/p colonoscopy with epinephrine injection and Endo Clip placement to polypectomy site in right colon 10/21/13.  No bleeding since colonoscopy.  Polyp pathology still pending at time of discharge ( I spoke with pathology). The polyp was large and unable to be totally removed. I have made her a follow up in office to discuss next step. Patient is safe for discharge from GI standpoint. She will call our office for any GI questions / concerns between now and time of follow up visit.   2. Anemia of acute blood loss. Hgb fell from baseline of 14 to 8. Appropriate rise in hgb after 2  units of blood. Hgb at 10.7 today.     LOS: 4 days   Tye Savoy  10/24/2013, 9:06 AM   GI ATTENDING  Patient remains stable without clinical evidence of bleeding. Hemoglobin stable. Tolerating diet. Okay for discharge with GI followup with Dr. Hilarie Fredrickson as planned.  Docia Chuck. Geri Seminole., M.D. [facility] Division of Gastroenterology

## 2013-10-25 NOTE — Telephone Encounter (Signed)
Dr Gilford Rile, I meant to send this to you. Thanks.

## 2013-10-25 NOTE — Telephone Encounter (Signed)
We will need to see her back in the office to recheck BP. I will ask my nurse, Arbie Cookey, to set this up.   Arbie Cookey - Can we put her in for a nurse visit tomorrow and then an office visit with med next week? She will also need a CBC checked on Friday here.

## 2013-10-25 NOTE — Telephone Encounter (Signed)
Regular diet Okay for MiraLax 17 g daily until regularity returns Prep H without hydrocortisone is available OTC

## 2013-10-25 NOTE — Telephone Encounter (Signed)
Informed pt of Dr Vena Rua recommendations/orders and she stated understanding. Pt is concerned because they took her off her BP meds in the hospital and for discharge and she wants to know if she needs to start back. Advised her this is something for Dr Gilford Rile to decide; I will send her a note. Dr Gilford Rile, pt had a terrible time after a COLONOSCOPY and developed a Post Polypectomy bleed. She was released from the hospital yesterday and received 2 units of blood. Could you read the hospital notes and advise on her BP med? Thought maybe she could come to your ofc for BP checks. Thanks. Shella Maxim, RN for Dr Zenovia Jarred

## 2013-10-25 NOTE — Telephone Encounter (Signed)
Thanks Dr Gilford Rile.

## 2013-10-25 NOTE — Telephone Encounter (Signed)
Pt discharged from hospital yesterday. She ate 2 "full meals" yesterday and a small meal for supper. She had a smll BM this am that was formed and almost black, but no BRB. She states she's doing well except for hemorrhoids; states they bleed a little from time to time. She is bothered by a small amount of gas and bloating. She doesn't want to get constipated so she took a generic Miralax last pm; should she continue this? And can she have something for the hemorrhoids? Anusol HC is too costly now and we have been ordering proctofoam; there is a generic OTC w/o Hydrocortisone. Please advise; diet is regular right? Thanks.

## 2013-10-25 NOTE — Telephone Encounter (Signed)
I would recommend she ask for RN visit at [facility] to check BP This will help guide decision on restarting BP Meds Also would check CBC on Friday of this week.

## 2013-10-27 ENCOUNTER — Other Ambulatory Visit: Payer: Medicare PPO

## 2013-10-27 DIAGNOSIS — K922 Gastrointestinal hemorrhage, unspecified: Secondary | ICD-10-CM

## 2013-10-27 NOTE — Telephone Encounter (Signed)
Dr. Gilford Rile you have no openings any time soon. Already scheduled for a follow up on 3rd week in February

## 2013-10-27 NOTE — Telephone Encounter (Signed)
Spoke with patient husband, patient has already been scheduled to come in on Friday for labs. Per request patient was told to come on in tomorrow to have her labs done.

## 2013-10-28 ENCOUNTER — Encounter: Payer: Self-pay | Admitting: *Deleted

## 2013-10-28 ENCOUNTER — Other Ambulatory Visit (INDEPENDENT_AMBULATORY_CARE_PROVIDER_SITE_OTHER): Payer: Medicare PPO

## 2013-10-28 ENCOUNTER — Telehealth: Payer: Self-pay | Admitting: *Deleted

## 2013-10-28 DIAGNOSIS — K922 Gastrointestinal hemorrhage, unspecified: Secondary | ICD-10-CM

## 2013-10-28 LAB — CBC WITH DIFFERENTIAL/PLATELET
BASOS ABS: 0 10*3/uL (ref 0.0–0.1)
BASOS PCT: 0.3 % (ref 0.0–3.0)
EOS ABS: 0.1 10*3/uL (ref 0.0–0.7)
Eosinophils Relative: 1 % (ref 0.0–5.0)
HCT: 33.2 % — ABNORMAL LOW (ref 36.0–46.0)
Hemoglobin: 10.6 g/dL — ABNORMAL LOW (ref 12.0–15.0)
Lymphocytes Relative: 11.5 % — ABNORMAL LOW (ref 12.0–46.0)
Lymphs Abs: 1.2 10*3/uL (ref 0.7–4.0)
MCHC: 32 g/dL (ref 30.0–36.0)
MCV: 90.9 fl (ref 78.0–100.0)
Monocytes Absolute: 0.6 10*3/uL (ref 0.1–1.0)
Monocytes Relative: 6.3 % (ref 3.0–12.0)
NEUTROS PCT: 80.9 % — AB (ref 43.0–77.0)
Neutro Abs: 8.1 10*3/uL — ABNORMAL HIGH (ref 1.4–7.7)
Platelets: 375 10*3/uL (ref 150.0–400.0)
RBC: 3.65 Mil/uL — ABNORMAL LOW (ref 3.87–5.11)
RDW: 14.3 % (ref 11.5–14.6)
WBC: 10.1 10*3/uL (ref 4.5–10.5)

## 2013-10-28 NOTE — Telephone Encounter (Signed)
Patient came in yesterday for lab draw, wrong tube was drawn for lab. Called and asked patient to back in today.

## 2013-10-31 ENCOUNTER — Telehealth: Payer: Self-pay | Admitting: Internal Medicine

## 2013-10-31 NOTE — Telephone Encounter (Signed)
Spoke to pt by phone She is feel better, still energy levels below baseline.  No further bleeding, melena, or rectal bleeding Used MiraLax for mild constipation with good results. Hgb checked Friday stable at 10.6 (See below) I will see her next Monday in clinic and plan repeat CBC I recommended a MVI with iron and she will start this today We discussed path results by phone (in lieu of path letter).  Adenoma without HGD or malignancy from the ascending colon.  Piecemeal and likely not completed resected. We will discuss further treatment options next Monday in clinic. She was very happy and relieved with these results and thanked me for the call. She did restart her BP meds and is tolerating well. I advised okay to resume 81 mg ASA daily at this time.   CBC    Component Value Date/Time   WBC 10.1 10/28/2013 1122   RBC 3.65* 10/28/2013 1122   HGB 10.6* 10/28/2013 1122   HCT 33.2* 10/28/2013 1122   PLT 375.0 10/28/2013 1122   MCV 90.9 10/28/2013 1122   MCH 29.5 10/24/2013 0803   MCHC 32.0 10/28/2013 1122   RDW 14.3 10/28/2013 1122   LYMPHSABS 1.2 10/28/2013 1122   MONOABS 0.6 10/28/2013 1122   EOSABS 0.1 10/28/2013 1122   BASOSABS 0.0 10/28/2013 1122

## 2013-11-02 ENCOUNTER — Telehealth: Payer: Self-pay | Admitting: Internal Medicine

## 2013-11-02 ENCOUNTER — Encounter: Payer: Self-pay | Admitting: Internal Medicine

## 2013-11-02 NOTE — Telephone Encounter (Signed)
Relevant patient education assigned to patient using Emmi. ° °

## 2013-11-07 ENCOUNTER — Telehealth: Payer: Self-pay | Admitting: Gastroenterology

## 2013-11-07 ENCOUNTER — Encounter: Payer: Self-pay | Admitting: Internal Medicine

## 2013-11-07 ENCOUNTER — Ambulatory Visit (INDEPENDENT_AMBULATORY_CARE_PROVIDER_SITE_OTHER): Payer: Medicare PPO | Admitting: Internal Medicine

## 2013-11-07 ENCOUNTER — Other Ambulatory Visit (INDEPENDENT_AMBULATORY_CARE_PROVIDER_SITE_OTHER): Payer: Medicare PPO

## 2013-11-07 VITALS — BP 138/70 | HR 72 | Ht 58.5 in | Wt 159.0 lb

## 2013-11-07 DIAGNOSIS — R109 Unspecified abdominal pain: Secondary | ICD-10-CM

## 2013-11-07 DIAGNOSIS — R140 Abdominal distension (gaseous): Secondary | ICD-10-CM

## 2013-11-07 DIAGNOSIS — R197 Diarrhea, unspecified: Secondary | ICD-10-CM

## 2013-11-07 DIAGNOSIS — D126 Benign neoplasm of colon, unspecified: Secondary | ICD-10-CM

## 2013-11-07 DIAGNOSIS — R143 Flatulence: Secondary | ICD-10-CM

## 2013-11-07 DIAGNOSIS — D62 Acute posthemorrhagic anemia: Secondary | ICD-10-CM

## 2013-11-07 DIAGNOSIS — R141 Gas pain: Secondary | ICD-10-CM

## 2013-11-07 DIAGNOSIS — R142 Eructation: Secondary | ICD-10-CM

## 2013-11-07 LAB — CBC
HCT: 35.3 % — ABNORMAL LOW (ref 36.0–46.0)
Hemoglobin: 11.3 g/dL — ABNORMAL LOW (ref 12.0–15.0)
MCHC: 31.9 g/dL (ref 30.0–36.0)
MCV: 88.6 fl (ref 78.0–100.0)
PLATELETS: 459 10*3/uL — AB (ref 150.0–400.0)
RBC: 3.99 Mil/uL (ref 3.87–5.11)
RDW: 14.7 % — ABNORMAL HIGH (ref 11.5–14.6)
WBC: 10.2 10*3/uL (ref 4.5–10.5)

## 2013-11-07 LAB — IBC PANEL
Iron: 24 ug/dL — ABNORMAL LOW (ref 42–145)
SATURATION RATIOS: 5.4 % — AB (ref 20.0–50.0)
Transferrin: 319.5 mg/dL (ref 212.0–360.0)

## 2013-11-07 LAB — FERRITIN: Ferritin: 6.8 ng/mL — ABNORMAL LOW (ref 10.0–291.0)

## 2013-11-07 MED ORDER — RESTORA PO CAPS: 1.0000 | ORAL_CAPSULE | Freq: Every day | ORAL | Status: AC

## 2013-11-07 NOTE — Telephone Encounter (Signed)
Faxed records to Dr. Sheryn Bison at [facility]

## 2013-11-07 NOTE — Patient Instructions (Signed)
We have sent the following medications to your pharmacy for you to pick up at your convenience: Restora 1 capsule daily  You are being referred to Dr. Kem Parkinson @ [facility]; They will contact you to set up that appointment.

## 2013-11-07 NOTE — Progress Notes (Signed)
Subjective:    Patient ID: Jillian Lynch, female    DOB: 1936-07-10, 78 y.o.   MRN: [id number]  HPI Jillian Lynch is a 78 -year-old female with a past medical history of adenomatous colon polyp with post-polypectomy bleed occurring in January 2015, hypertension, hyperlipidemia, breast cancer status post surgery, chemotherapy and radiation felt to be in remission, hypothyroidism who is seen in hospital followup. Jillian Lynch had her first screening colonoscopy which was performed on 10/19/2013. 76-25 mm flat and sessile polyp was found in the ascending colon between the IC valve and the first haustral fold.  EMR was attempted but the polyp did not lift well in the center. The polyp was unable to be completely snared. Cold biopsy forceps were used to remove as much of the remaining polyp tissue as possible. The polyp site was tattooed.  An additional 4 mm polyp was removed in the sigmoid which was not found to be adenomatous.  The ascending colon polyp as described above was found to be tubular adenoma without high-grade dysplasia or cancer.  After colonoscopy she returned home and then had a melenic bowel movement. This continued over the next several days and she was admitted to the hospital with post-polypectomy bleeding. Dr. Olevia Perches performed the followup colonoscopy on 10/21/2013 for post-polypectomy bleeding. The post-polypectomy site was identified with surface ulceration and adherent blood clot. There was oozing at the edge of the ulcer. She injected the ulcer with epinephrine and placed hemostatic clips with good hemostasis. She was observed in the hospital without further bleeding and discharged home.  Today she reports she is feeling well. Her energy levels have not returned to normal. No abdominal pain. She has noticed increased borborygmi and slight increase in bloating since her procedures. Good appetite without nausea or vomiting. She has been worried about the large polyp that was found.  Past  Medical History  Diagnosis Date  . HTN (hypertension)   . Hyperlipidemia   . Anxiety   . Herpes zoster     Left arm 2007  . Breast cancer 2006    Dr Lucile Crater, left breast, had chemo,mastectomy and radiation  . Basal cell carcinoma     Left eyebrow  . Thyroid disease   . Insomnia   . Vertigo     Review of Systems As per history of present illness, otherwise negative  Current Medications, Allergies, Past Medical History, Past Surgical History, Family History and Social History were reviewed in [employer] record.     Objective:   Physical Exam BP 138/70  Pulse 72  Ht 4' 10.5" (1.486 m)  Wt 159 lb (72.122 kg)  BMI 32.66 kg/m2 Constitutional: Well-developed and well-nourished. No distress. HEENT: Normocephalic and atraumatic. Oropharynx is clear and moist. No oropharyngeal exudate. Conjunctivae are normal.  No scleral icterus. Neck: Neck supple. Trachea midline. Cardiovascular: Normal rate, regular rhythm and intact distal pulses.  Pulmonary/chest: Effort normal and breath sounds normal. No wheezing, rales or rhonchi. Abdominal: Soft, nontender, nondistended. Bowel sounds active throughout.  Extremities: no clubbing, cyanosis, or edema Lymphadenopathy: No cervical adenopathy noted. Neurological: Alert and oriented to person place and time. Skin: Skin is warm and dry. No rashes noted. Psychiatric: Normal mood and affect. Behavior is normal.  CBC    Component Value Date/Time   WBC 10.1 10/28/2013 1122   RBC 3.65* 10/28/2013 1122   HGB 10.6* 10/28/2013 1122   HCT 33.2* 10/28/2013 1122   PLT 375.0 10/28/2013 1122   MCV 90.9 10/28/2013 1122  MCH 29.5 10/24/2013 0803   MCHC 32.0 10/28/2013 1122   RDW 14.3 10/28/2013 1122   LYMPHSABS 1.2 10/28/2013 1122   MONOABS 0.6 10/28/2013 1122   EOSABS 0.1 10/28/2013 1122   BASOSABS 0.0 10/28/2013 1122    CMP     Component Value Date/Time   NA 139 10/20/2013 2204   K 4.5 10/20/2013 2204   CL 104 10/20/2013 2204    CO2 25 10/20/2013 2204   GLUCOSE 130* 10/20/2013 2204   BUN 13 10/20/2013 2204   CREATININE 0.49* 10/20/2013 2204   CREATININE 0.62 10/24/2011 1146   CALCIUM 9.0 10/20/2013 2204   PROT 7.2 10/20/2013 2204   ALBUMIN 3.5 10/20/2013 2204   AST 16 10/20/2013 2204   ALT 14 10/20/2013 2204   ALKPHOS 86 10/20/2013 2204   BILITOT 0.3 10/20/2013 2204   GFRNONAA >90 10/20/2013 2204   GFRAA >90 10/20/2013 2204       Assessment & Plan:  78 -year-old female with a past medical history of adenomatous colon polyp with post-polypectomy bleed occurring in January 2015, hypertension, hyperlipidemia, breast cancer status post surgery, chemotherapy and radiation felt to be in remission, hypothyroidism who is seen in hospital followup.  1.  Adenomatous colon polyp of the ascending colon/status post piecemeal/incomplete resection/post-polypectomy bleeding -- her post-polypectomy bleeding resolved after endoscopic therapy performed by Dr. Olevia Perches 2 days after her initial colonoscopy. We have reviewed the pathology results today extensively which showed 2 or adenoma. There was no high-grade dysplasia or malignancy, though she is aware that this polyp was likely in completely resected. We discussed options to complete resection of this adenoma which includes repeat colonoscopy with completion polypectomy and perhaps APC ablation of any remaining tissue versus segmental surgical resection.  For understandable and obvious reasons she is hesitant for future colonoscopy because of the post-polypectomy bleeding that occurred. She also remains nervous that the polyp was not completely resected.    If the endoscopic route was selected, she would very likely need several additional colonoscopies to ensure the polyp has been removed completely.  She understands that the risk of post-polypectomy bleeding would be present after each colonoscopy.  We also discussed possible segmental resection, and after this discussion she prefers to meet  with a surgeon to determine exactly what the surgery would involve including the risks and recovery time.  She is aware that surgery would be the only definitive way to know the polyp is removed completely.  She has a history with Dr. Juanita Craver at [facility], and prefers for surgical opinion at [facility].  With this in mind I will refer her to Dr. Kem Parkinson for discussion of segmental resection. Again this polyp was located between the ileocecal valve and the first ascending colon haustral fold.  2.  Borborygmi/abdominal bloating -- trial probiotic, she was given Restora to take 1 capsule daily.  I asked that she notify me if her symptoms worsen or fail to resolve   3.  Post-hemorrhagic anemia --she is taking a multivitamin with iron daily. I will check CBC and iron counts today.

## 2013-11-08 ENCOUNTER — Telehealth: Payer: Self-pay | Admitting: Internal Medicine

## 2013-11-08 ENCOUNTER — Other Ambulatory Visit: Payer: Self-pay

## 2013-11-08 DIAGNOSIS — D509 Iron deficiency anemia, unspecified: Secondary | ICD-10-CM

## 2013-11-08 MED ORDER — INTEGRA PLUS PO CAPS: 1.0000 | ORAL_CAPSULE | Freq: Every day | ORAL | Status: DC

## 2013-11-08 NOTE — Telephone Encounter (Signed)
Spoke to pt. She got the three active ingredienst from restora and will look for those in other probiotics, or buy them separately to take

## 2013-11-11 ENCOUNTER — Ambulatory Visit (INDEPENDENT_AMBULATORY_CARE_PROVIDER_SITE_OTHER): Payer: Medicare PPO | Admitting: Internal Medicine

## 2013-11-11 ENCOUNTER — Encounter: Payer: Self-pay | Admitting: Internal Medicine

## 2013-11-11 VITALS — BP 140/70 | HR 68 | Temp 98.0°F | Wt 158.0 lb

## 2013-11-11 DIAGNOSIS — K922 Gastrointestinal hemorrhage, unspecified: Secondary | ICD-10-CM

## 2013-11-11 DIAGNOSIS — F4323 Adjustment disorder with mixed anxiety and depressed mood: Secondary | ICD-10-CM

## 2013-11-11 MED ORDER — ASPIRIN EC 81 MG PO TBEC: 81.0000 mg | DELAYED_RELEASE_TABLET | Freq: Every day | ORAL | Status: AC

## 2013-11-11 MED ORDER — SERTRALINE HCL 100 MG PO TABS: 100.0000 mg | ORAL_TABLET | Freq: Every day | ORAL | Status: AC

## 2013-11-11 NOTE — Assessment & Plan Note (Signed)
Symptoms improved with Sertraline, however not completely controlled. Will try increase in dose to 100mg  daily. Plan for follow up in 4 weeks or sooner as needed.

## 2013-11-11 NOTE — Progress Notes (Signed)
Pre-visit discussion using our clinic review tool. No additional management support is needed unless otherwise documented below in the visit note.  

## 2013-11-11 NOTE — Patient Instructions (Signed)
Increase sertraline to 100mg  daily.  Follow up in 4 weeks.

## 2013-11-11 NOTE — Assessment & Plan Note (Addendum)
S/p recent GI hemorrhage after polypectomy. Symptomatically doing well with no evidence of recurrent bleeding. Plan for evaluation with Dr. Sheryn Bison at [facility] for possible partial colectomy. Hgb this week was improved, plan repeat CBC in 4 weeks per Dr. Hilarie Fredrickson. Continue iron supplement.

## 2013-11-11 NOTE — Progress Notes (Signed)
Subjective:    Patient ID: Jillian Lynch, female    DOB: 1935-12-04, 78 y.o.   MRN: [id number]  HPI 78YO female presents for follow up.  Recently had colonoscopy, then later that day developed GI bleeding with large amount bloody stool, went to [facility] ED. Followed up by GI physician next day, had recurrent bleeding. Admitted [facility], had repeat colonoscopy with epi injection at site of polypectomy, cauterization of bleeding and clip placement. Doing well post procedure. No recurrent bloody stool. Started on iron supplement and probiotic. Planned for evaluation with Dr. Sheryn Bison in surgery at [facility] for possible partial colectomy.  Started on Sertraline at last visit. Reports improvement in mood and sleep. However, some persistent symptoms of anxiety and depressed mood noted. Difficulty time for pt and her family. No side effects of medication noted.  Review of Systems  Constitutional: Negative for fever, chills, appetite change, fatigue and unexpected weight change.  HENT: Negative for congestion, ear pain, sinus pressure, sore throat, trouble swallowing and voice change.   Eyes: Negative for visual disturbance.  Respiratory: Negative for cough, shortness of breath, wheezing and stridor.   Cardiovascular: Negative for chest pain, palpitations and leg swelling.  Gastrointestinal: Positive for blood in stool (now resolved). Negative for nausea, vomiting, abdominal pain, diarrhea, constipation, abdominal distention and anal bleeding.  Genitourinary: Negative for dysuria and flank pain.  Musculoskeletal: Negative for arthralgias, gait problem, myalgias and neck pain.  Skin: Negative for color change and rash.  Neurological: Negative for dizziness and headaches.  Hematological: Negative for adenopathy. Does not bruise/bleed easily.  Psychiatric/Behavioral: Positive for dysphoric mood. Negative for suicidal ideas and sleep disturbance. The patient is nervous/anxious.        Objective:    BP  140/70  Pulse 68  Temp(Src) 98 F (36.7 C) (Oral)  Wt 158 lb (71.668 kg)  SpO2 99% Physical Exam  Constitutional: She is oriented to person, place, and time. She appears well-developed and well-nourished. No distress.  HENT:  Head: Normocephalic and atraumatic.  Right Ear: External ear normal.  Left Ear: External ear normal.  Nose: Nose normal.  Mouth/Throat: Oropharynx is clear and moist. No oropharyngeal exudate.  Eyes: Conjunctivae are normal. Pupils are equal, round, and reactive to light. Right eye exhibits no discharge. Left eye exhibits no discharge. No scleral icterus.  Neck: Normal range of motion. Neck supple. No tracheal deviation present. No thyromegaly present.  Cardiovascular: Normal rate, regular rhythm, normal heart sounds and intact distal pulses.  Exam reveals no gallop and no friction rub.   No murmur heard. Pulmonary/Chest: Effort normal and breath sounds normal. No accessory muscle usage. Not tachypneic. No respiratory distress. She has no decreased breath sounds. She has no wheezes. She has no rhonchi. She has no rales. She exhibits no tenderness.  Abdominal: Soft. Bowel sounds are normal. She exhibits no distension and no mass. There is tenderness (mild left lower abdomen). There is no rebound and no guarding.  Musculoskeletal: Normal range of motion. She exhibits no edema and no tenderness.  Lymphadenopathy:    She has no cervical adenopathy.  Neurological: She is alert and oriented to person, place, and time. No cranial nerve deficit. She exhibits normal muscle tone. Coordination normal.  Skin: Skin is warm and dry. No rash noted. She is not diaphoretic. No erythema. No pallor.  Psychiatric: She has a normal mood and affect. Her speech is normal and behavior is normal. Judgment and thought content normal. Cognition and memory are normal. She expresses no  suicidal ideation. She expresses no suicidal plans.          Assessment & Plan:   Problem List Items  Addressed This Visit   Adjustment disorder with mixed anxiety and depressed mood - Primary     Symptoms improved with Sertraline, however not completely controlled. Will try increase in dose to 100mg  daily. Plan for follow up in 4 weeks or sooner as needed.    Relevant Medications      sertraline (ZOLOFT) tablet   Hemorrhage of gastrointestinal tract, unspecified     S/p recent GI hemorrhage after polypectomy. Symptomatically doing well with no evidence of recurrent bleeding. Plan for evaluation with Dr. Sheryn Bison at [facility] for possible partial colectomy. Hgb this week was improved, plan repeat CBC in 4 weeks per Dr. Hilarie Fredrickson. Continue iron supplement.        Return in about 4 weeks (around 12/09/2013) for Recheck.

## 2013-12-06 ENCOUNTER — Telehealth: Payer: Self-pay

## 2013-12-06 NOTE — Telephone Encounter (Signed)
Message copied by Marlon Pel on Tue Dec 06, 2013 10:48 AM ------      Message from: Marlon Pel      Created: Tue Nov 08, 2013  8:51 AM       Needs cbc- pyrtle      See results 11/08/13 ------

## 2013-12-06 NOTE — Telephone Encounter (Signed)
Patient notified that she is due for labs.  She wants to have drawn at the [place] [facility] in [place].  She is advised that is ok

## 2013-12-07 ENCOUNTER — Other Ambulatory Visit (INDEPENDENT_AMBULATORY_CARE_PROVIDER_SITE_OTHER): Payer: Medicare PPO

## 2013-12-07 DIAGNOSIS — D509 Iron deficiency anemia, unspecified: Secondary | ICD-10-CM

## 2013-12-07 LAB — CBC WITH DIFFERENTIAL/PLATELET
BASOS PCT: 0.3 % (ref 0.0–3.0)
Basophils Absolute: 0 10*3/uL (ref 0.0–0.1)
EOS PCT: 0.5 % (ref 0.0–5.0)
Eosinophils Absolute: 0.1 10*3/uL (ref 0.0–0.7)
HCT: 37 % (ref 36.0–46.0)
Hemoglobin: 12 g/dL (ref 12.0–15.0)
LYMPHS PCT: 8.9 % — AB (ref 12.0–46.0)
Lymphs Abs: 1.1 10*3/uL (ref 0.7–4.0)
MCHC: 32.4 g/dL (ref 30.0–36.0)
MCV: 87.4 fl (ref 78.0–100.0)
Monocytes Absolute: 0.7 10*3/uL (ref 0.1–1.0)
Monocytes Relative: 5.6 % (ref 3.0–12.0)
NEUTROS PCT: 84.7 % — AB (ref 43.0–77.0)
Neutro Abs: 10.4 10*3/uL — ABNORMAL HIGH (ref 1.4–7.7)
Platelets: 394 10*3/uL (ref 150.0–400.0)
RBC: 4.23 Mil/uL (ref 3.87–5.11)
RDW: 17.1 % — ABNORMAL HIGH (ref 11.5–14.6)
WBC: 12.2 10*3/uL — AB (ref 4.5–10.5)

## 2013-12-15 ENCOUNTER — Ambulatory Visit (INDEPENDENT_AMBULATORY_CARE_PROVIDER_SITE_OTHER): Payer: Medicare PPO | Admitting: Internal Medicine

## 2013-12-15 ENCOUNTER — Encounter: Payer: Self-pay | Admitting: Internal Medicine

## 2013-12-15 VITALS — BP 136/70 | HR 75 | Temp 98.2°F | Wt 156.0 lb

## 2013-12-15 DIAGNOSIS — R141 Gas pain: Secondary | ICD-10-CM

## 2013-12-15 DIAGNOSIS — F4323 Adjustment disorder with mixed anxiety and depressed mood: Secondary | ICD-10-CM

## 2013-12-15 DIAGNOSIS — R142 Eructation: Secondary | ICD-10-CM | POA: Insufficient documentation

## 2013-12-15 DIAGNOSIS — K922 Gastrointestinal hemorrhage, unspecified: Secondary | ICD-10-CM

## 2013-12-15 DIAGNOSIS — I10 Essential (primary) hypertension: Secondary | ICD-10-CM

## 2013-12-15 DIAGNOSIS — R143 Flatulence: Secondary | ICD-10-CM

## 2013-12-15 DIAGNOSIS — R350 Frequency of micturition: Secondary | ICD-10-CM | POA: Insufficient documentation

## 2013-12-15 NOTE — Assessment & Plan Note (Signed)
S/p recent evaluation with Dr. Sheryn Bison at [facility]. Notes reviewed today through [place]. Plans for partial colectomy next month. Will follow. Recent Hgb improved.

## 2013-12-15 NOTE — Progress Notes (Signed)
Subjective:    Patient ID: Jillian Lynch. [name], female    DOB: 1935-10-26, 78 y.o.   MRN: [id number]  HPI 78YO female presents for follow up.  GI hemorrhage - s/p evaluation with Dr. Sheryn Bison at [facility]. Plans for colectomy next month. No recent hematochezia. Recent Hgb stable.  Back pain - Aching pain in upper and lower back over last few weeks. Improved with Tylenol. No recent trauma.  Anxiety/Depression - Symptoms improved with use of Sertraline.  Belching - Several weeks of increased bloating and belching. No abdominal pain. No change in bowel habits. No nausea, vomiting, change in appetite. Was thinking of trying Simethicone.  Review of Systems  Constitutional: Negative for fever, chills, appetite change, fatigue and unexpected weight change.  HENT: Negative for congestion, ear pain, sinus pressure, sore throat, trouble swallowing and voice change.   Eyes: Negative for visual disturbance.  Respiratory: Negative for cough, shortness of breath, wheezing and stridor.   Cardiovascular: Negative for chest pain, palpitations and leg swelling.  Gastrointestinal: Positive for abdominal distention. Negative for nausea, vomiting, abdominal pain, diarrhea, constipation, blood in stool and anal bleeding.  Genitourinary: Negative for dysuria and flank pain.  Musculoskeletal: Positive for arthralgias, back pain and myalgias. Negative for gait problem and neck pain.  Skin: Negative for color change and rash.  Neurological: Negative for dizziness and headaches.  Hematological: Negative for adenopathy. Does not bruise/bleed easily.  Psychiatric/Behavioral: Negative for suicidal ideas, sleep disturbance and dysphoric mood. The patient is not nervous/anxious.        Objective:    BP 136/70  Pulse 75  Temp(Src) 98.2 F (36.8 C) (Oral)  Wt 156 lb (70.761 kg)  SpO2 99% Physical Exam  Constitutional: She is oriented to person, place, and time. She appears well-developed and well-nourished. No distress.    HENT:  Head: Normocephalic and atraumatic.  Right Ear: External ear normal.  Left Ear: External ear normal.  Nose: Nose normal.  Mouth/Throat: Oropharynx is clear and moist. No oropharyngeal exudate.  Eyes: Conjunctivae are normal. Pupils are equal, round, and reactive to light. Right eye exhibits no discharge. Left eye exhibits no discharge. No scleral icterus.  Neck: Normal range of motion. Neck supple. No tracheal deviation present. No thyromegaly present.  Cardiovascular: Normal rate, regular rhythm, normal heart sounds and intact distal pulses.  Exam reveals no gallop and no friction rub.   No murmur heard. Pulmonary/Chest: Effort normal and breath sounds normal. No accessory muscle usage. Not tachypneic. No respiratory distress. She has no decreased breath sounds. She has no wheezes. She has no rhonchi. She has no rales. She exhibits no tenderness.  Abdominal: Soft. Bowel sounds are normal. She exhibits no distension and no mass. There is no tenderness. There is no rebound and no guarding.  Musculoskeletal: Normal range of motion. She exhibits no edema and no tenderness.  Lymphadenopathy:    She has no cervical adenopathy.  Neurological: She is alert and oriented to person, place, and time. No cranial nerve deficit. She exhibits normal muscle tone. Coordination normal.  Skin: Skin is warm and dry. No rash noted. She is not diaphoretic. No erythema. No pallor.  Psychiatric: She has a normal mood and affect. Her behavior is normal. Judgment and thought content normal.          Assessment & Plan:   Problem List Items Addressed This Visit   Adjustment disorder with mixed anxiety and depressed mood     Symptoms improved on higher dose of Sertraline. Will continue.  Belching     Recent increase in belching. Will send breath test for H. Pylori.  Pt will also try using Simethicone prn. Follow up prn.    Relevant Orders      H. pylori breath test   Hemorrhage of gastrointestinal  tract, unspecified - Primary     S/p recent evaluation with Dr. Sheryn Bison at [facility]. Notes reviewed today through [place]. Plans for partial colectomy next month. Will follow. Recent Hgb improved.    Hypertension      BP Readings from Last 3 Encounters:  12/15/13 136/70  11/11/13 140/70  11/07/13 138/70   BP well controlled on enalapril. Will continue.    Urinary frequency     Recent increase in urinary frequency at night. Will check urinalysis today.    Relevant Orders      POCT Urinalysis Dipstick       Return in about 3 months (around 03/17/2014) for Recheck.

## 2013-12-15 NOTE — Assessment & Plan Note (Signed)
Symptoms improved on higher dose of Sertraline. Will continue.

## 2013-12-15 NOTE — Assessment & Plan Note (Signed)
Recent increase in urinary frequency at night. Will check urinalysis today.

## 2013-12-15 NOTE — Assessment & Plan Note (Signed)
Recent increase in belching. Will send breath test for H. Pylori.  Pt will also try using Simethicone prn. Follow up prn.

## 2013-12-15 NOTE — Assessment & Plan Note (Signed)
BP Readings from Last 3 Encounters:  12/15/13 136/70  11/11/13 140/70  11/07/13 138/70   BP well controlled on enalapril. Will continue.

## 2013-12-16 ENCOUNTER — Other Ambulatory Visit (INDEPENDENT_AMBULATORY_CARE_PROVIDER_SITE_OTHER): Payer: Medicare PPO

## 2013-12-16 DIAGNOSIS — R350 Frequency of micturition: Secondary | ICD-10-CM

## 2013-12-16 LAB — POCT URINALYSIS DIPSTICK
BILIRUBIN UA: NEGATIVE
Blood, UA: NEGATIVE
Glucose, UA: NEGATIVE
Ketones, UA: NEGATIVE
Leukocytes, UA: NEGATIVE
Nitrite, UA: NEGATIVE
PROTEIN UA: NEGATIVE
Spec Grav, UA: 1.01
Urobilinogen, UA: 0.2
pH, UA: 7

## 2013-12-16 LAB — H. PYLORI BREATH TEST: H. pylori Breath Test: NOT DETECTED

## 2014-01-17 HISTORY — PX: LAPAROSCOPIC PARTIAL COLECTOMY: SHX5907

## 2014-01-26 ENCOUNTER — Telehealth: Payer: Self-pay | Admitting: Internal Medicine

## 2014-01-26 NOTE — Telephone Encounter (Signed)
Spoke with pt, she was at [facility] for a scheduled bowel surgery 01/16/14, was discharged 01/24/14. She has noticed bilateral lower leg edema since returning home. Denies pain, shortness of breath. No leg pain or redness. Pt is ambulating well. Has a follow up appointment scheduled with her [place] in one month.  Would like to be evaluated for her edema before the weekend. Appt scheduled for tomorrow with Raquel.

## 2014-01-26 NOTE — Telephone Encounter (Signed)
The patient is needing a follow up call from the nurse her feet are swelling and she is needing help getting up . She has just been release from the hospital and she is needing a follow up appointment .

## 2014-01-27 ENCOUNTER — Ambulatory Visit (INDEPENDENT_AMBULATORY_CARE_PROVIDER_SITE_OTHER): Payer: Medicare PPO | Admitting: Adult Health

## 2014-01-27 ENCOUNTER — Encounter: Payer: Self-pay | Admitting: Adult Health

## 2014-01-27 VITALS — BP 145/74 | HR 94 | Temp 98.1°F | Resp 18 | Wt 163.0 lb

## 2014-01-27 DIAGNOSIS — R0602 Shortness of breath: Secondary | ICD-10-CM | POA: Insufficient documentation

## 2014-01-27 NOTE — Progress Notes (Signed)
Patient ID: Jillian Lynch. Bardon, female   DOB: May 11, 1936, 78 y.o.   MRN: [id number]   Subjective:    Patient ID: Jillian Lynch. Gunner, female    DOB: 1936-07-16, 78 y.o.   MRN: [id number]  HPI Patient is a pleasant 78 year old female who presents to clinic status post hospitalization from 01/16/2014 through 01/24/2014 at [facility]. Patient is status post partial right colectomy on 01/17/2014 for colon mass. She presents to clinic with complaints of swelling of both legs. Patient is using a walker to ambulate secondary to significant weakness. This is very unusual for this patient as she has never used any assistive devices to ambulate. Her heart rate was noted to fluctuate between the high 80s and 120. Her oxygen saturation ranged between 86% and 90% on room air. Patient was visibly short of breath using accessory muscles to aid the process of breathing. Pt reports shortness of breath. Patient reports that she was discharged home on Lovenox and administering as prescribed. She presents to clinic with her husband.    Past Medical History  Diagnosis Date  . HTN (hypertension)   . Hyperlipidemia   . Anxiety   . Herpes zoster     Left arm 2007  . Breast cancer 2006    Dr Lucile Crater, left breast, had chemo,mastectomy and radiation  . Basal cell carcinoma     Left eyebrow  . Thyroid disease   . Insomnia   . Vertigo      Past Surgical History  Procedure Laterality Date  . Shoulder surgery Right   . Mastectomy, radical Left 2007  . Colonoscopy N/A 10/21/2013    Procedure: COLONOSCOPY;  Surgeon: Lafayette Dragon, MD;  Location: [facility] ENDOSCOPY;  Service: Endoscopy;  Laterality: N/A;  . Laparoscopic partial colectomy Right 01/17/14    [facility] - Dr. Sheryn Bison     Family History  Problem Relation Age of Onset  . Heart disease Mother   . Heart disease Father   . Lung cancer Sister   . Colon cancer Other 21  . Lung cancer Brother   . Heart disease Brother   . Heart disease Sister   . Stroke Sister        History   Social History  . Marital Status: Married    Spouse Name: N/A    Number of Children: 3  . Years of Education: N/A   Occupational History  . Retired Restaurant manager, fast food   .     Social History Main Topics  . Smoking status: Never Smoker   . Smokeless tobacco: Never Used  . Alcohol Use: Yes     Comment: Occasional - wine or beer  . Drug Use: No  . Sexual Activity: No   Other Topics Concern  . Not on file   Social History Narrative   Regular Exercise -  Walk 3 to 4 x's week   Daily Caffeine Use:  1/diet pepsi           Current Outpatient Prescriptions on File Prior to Visit  Medication Sig Dispense Refill  . acetaminophen (TYLENOL) 325 MG tablet Take 325 mg by mouth every 6 (six) hours as needed for mild pain.       Marland Kitchen ALPRAZolam (XANAX) 0.5 MG tablet Take 0.5 mg by mouth 3 (three) times daily as needed for anxiety.      Marland Kitchen aspirin EC 81 MG tablet Take 1 tablet (81 mg total) by mouth daily.  90 tablet  0  . atorvastatin (LIPITOR)  20 MG tablet Take 20 mg by mouth daily.      . cholecalciferol (VITAMIN D) 1000 UNITS tablet Take 1,000 Units by mouth daily.      . enalapril (VASOTEC) 10 MG tablet Take 10 mg by mouth daily.      . fluticasone (FLONASE) 50 MCG/ACT nasal spray Place 1 spray into both nostrils daily as needed for allergies or rhinitis.      Marland Kitchen gabapentin (NEURONTIN) 300 MG capsule Take 300 mg by mouth 2 (two) times daily.      Marland Kitchen levothyroxine (SYNTHROID, LEVOTHROID) 75 MCG tablet Take 75 mcg by mouth daily before breakfast.      . Multiple Vitamin (MULTIVITAMIN) tablet Take 1 tablet by mouth daily.      Marland Kitchen omeprazole (PRILOSEC) 20 MG capsule Take 20 mg by mouth daily.        . Probiotic Product (RESTORA) CAPS Take 1 capsule by mouth daily.  30 capsule  6  . sertraline (ZOLOFT) 100 MG tablet Take 1 tablet (100 mg total) by mouth at bedtime.  30 tablet  6   No current facility-administered medications on file prior to visit.     Review of Systems   Constitutional: Positive for fatigue.  Respiratory: Positive for shortness of breath.   Cardiovascular: Positive for leg swelling. Negative for chest pain.  Gastrointestinal: Negative for abdominal pain.  Neurological: Positive for weakness. Negative for dizziness.       Objective:  BP 145/74  Pulse 94  Temp(Src) 98.1 F (36.7 C) (Oral)  Resp 18  Wt 163 lb (73.936 kg)  SpO2 96%   Physical Exam  Constitutional: She is oriented to person, place, and time.  Pt appearing weak, acutely ill appearing. Pt appears to be in mild distress  HENT:  Head: Normocephalic and atraumatic.  Cardiovascular: Normal heart sounds.  Exam reveals no gallop.   No murmur heard. Pulse ranges between 86-120  Pulmonary/Chest: She has rales.  Faint rales bilateral bases posteriorly. Use of accessory muscles to breathe. Shortness of breath increases with exertion. Sats on RA 86% - 90%  Abdominal: Soft.  Musculoskeletal: She exhibits edema.  2-3 + pitting edema bilateral lower extremity. Edema from feet to thigh bil  Neurological: She is alert and oriented to person, place, and time.  Psychiatric: She has a normal mood and affect.      Assessment & Plan:   1. Shortness of breath Pt is s/p partial colectomy on 01/17/14 discharged from the hospital on 01/24/14 presents to clinic with sob, LE pitting edema. She is on lovenox and reports compliance. Discussed with patient and her husband that I wanted to send her to the ED at [facility] for further evaluation of her symptoms. Concerning for blood clot. ?CHF. Pt does not have a history of CHF. Husband will transport pt to the ED at [facility]. We called the ED at [facility] to advise that patient was being sent. Husband also wanted [place] to call Dr. March Rummage office. We tried multiple times but could not get a person on the phone. A message was left on the nurse triage line.

## 2014-01-27 NOTE — Progress Notes (Signed)
Pre visit review using our clinic review tool, if applicable. No additional management support is needed unless otherwise documented below in the visit note. 

## 2014-02-05 ENCOUNTER — Other Ambulatory Visit: Payer: Self-pay | Admitting: Internal Medicine

## 2014-02-06 ENCOUNTER — Telehealth: Payer: Self-pay | Admitting: Internal Medicine

## 2014-02-06 NOTE — Telephone Encounter (Signed)
Came in to office to inform Dr. Gilford Rile that pt has passed away, 02/07/14.  States Iver Nestle is in paper today.  Rich and [facility].

## 2014-02-06 NOTE — Telephone Encounter (Signed)
Dr. Gilford Rile already aware.

## 2014-02-27 DEATH — deceased

## 2014-04-11 ENCOUNTER — Encounter: Payer: Medicare PPO | Admitting: Internal Medicine

## 2014-04-13 ENCOUNTER — Encounter: Payer: Medicare PPO | Admitting: Internal Medicine

## 2014-07-21 ENCOUNTER — Ambulatory Visit: Payer: Self-pay | Admitting: Orthopedic Surgery

## 2014-08-21 ENCOUNTER — Other Ambulatory Visit (HOSPITAL_COMMUNITY): Payer: Medicare PPO

## 2014-08-29 ENCOUNTER — Inpatient Hospital Stay (HOSPITAL_COMMUNITY): Admission: RE | Admit: 2014-08-29 | Payer: Medicare PPO | Source: Ambulatory Visit | Admitting: Orthopedic Surgery

## 2014-08-29 ENCOUNTER — Encounter (HOSPITAL_COMMUNITY): Admission: RE | Payer: Self-pay | Source: Ambulatory Visit

## 2014-08-29 SURGERY — ARTHROPLASTY, SHOULDER, TOTAL
Anesthesia: General | Laterality: Left

## 2015-11-28 HISTORY — PX: COLONOSCOPY: SHX174

## 2016-02-08 ENCOUNTER — Institutional Professional Consult (permissible substitution): Payer: Self-pay | Admitting: Internal Medicine

## 2016-02-19 ENCOUNTER — Ambulatory Visit (INDEPENDENT_AMBULATORY_CARE_PROVIDER_SITE_OTHER)
Admission: RE | Admit: 2016-02-19 | Discharge: 2016-02-19 | Disposition: A | Discharge status: Home / Self Care | Payer: Medicare Other | Source: Ambulatory Visit | Attending: Internal Medicine | Admitting: Internal Medicine

## 2016-02-19 ENCOUNTER — Ambulatory Visit (INDEPENDENT_AMBULATORY_CARE_PROVIDER_SITE_OTHER): Payer: Medicare Other | Admitting: Internal Medicine

## 2016-02-19 ENCOUNTER — Encounter: Payer: Self-pay | Admitting: Internal Medicine

## 2016-02-19 ENCOUNTER — Telehealth: Payer: Self-pay | Admitting: Internal Medicine

## 2016-02-19 ENCOUNTER — Institutional Professional Consult (permissible substitution): Payer: Self-pay | Admitting: Internal Medicine

## 2016-02-19 VITALS — BP 118/60 | HR 83 | Ht 63.0 in | Wt 161.2 lb

## 2016-02-19 DIAGNOSIS — R0600 Dyspnea, unspecified: Secondary | ICD-10-CM | POA: Diagnosis not present

## 2016-02-19 DIAGNOSIS — I10 Essential (primary) hypertension: Secondary | ICD-10-CM | POA: Diagnosis not present

## 2016-02-19 MED ORDER — VALSARTAN 160 MG PO TABS: 160.0000 mg | ORAL_TABLET | Freq: Every day | ORAL | Status: DC

## 2016-02-19 NOTE — Patient Instructions (Signed)
The only way to tell whether the medication is the problem is to stop it for a full six weeks   Stop vasotec/enalapril today and start valsartan 160 mg daily    If you are satisfied after 6 weeks with your treatment plan,  let your doctor know and he/she can either refill your medications or you can return here when your prescription runs out.     If in any way you are not 100% satisfied,  please tell us.  If 100% better, tell your friends!  Pulmonary follow up is as needed  >  Call to schedule a CPST (Ask for [facility] [phone])

## 2016-02-19 NOTE — Progress Notes (Signed)
Subjective:    Patient ID: [name] [name], female    DOB: 1936/07/27,   MRN: [id number]  HPI  49 yowf with dm/ hbp on ACEi with onset of decreased activity tolerance around 2014 with abn pfts so referred to pulmonary clinic 02/19/2016 by Dr Rory Percy   02/19/2016 1st [facility] office visit/ [name] [name]   Chief Complaint  Patient presents with  . Pulmonary Consult    Referred by Dr. Rory Percy. Pt c/o DOE since "I really don't know". When asked what sort of exertion makes her SOB she states "I' am really not that active, not sure".   variable doe x 3 years but getting worse and has to stop and rest every few minutes  Doing housework now where at baseline could do the whole house/ already had cards w/u but no gxt done > chemical studies wnl  No obvious day to day or daytime variabilty or assoc chronic cough or cp or chest tightness, subjective wheeze overt sinus or hb symptoms. No unusual exp hx or h/o childhood pna/ asthma or knowledge of premature birth.  Sleeping ok without nocturnal  or early am exacerbation  of respiratory  c/o's or need for noct saba. Also denies any obvious fluctuation of symptoms with weather or environmental changes or other aggravating or alleviating factors except as outlined above   Current Medications, Allergies, Complete Past Medical History, Past Surgical History, Family History, and Social History were reviewed in [employer] record.                 Review of Systems  Constitutional: Negative for fever, chills and unexpected weight change.  HENT: Positive for congestion. Negative for dental problem, ear pain, nosebleeds, postnasal drip, rhinorrhea, sinus pressure, sneezing, sore throat, trouble swallowing and voice change.   Eyes: Negative for visual disturbance.  Respiratory: Positive for shortness of breath. Negative for cough and choking.   Cardiovascular: Negative for chest pain and leg swelling.  Gastrointestinal:  Negative for vomiting, abdominal pain and diarrhea.  Genitourinary: Negative for difficulty urinating.  Musculoskeletal: Negative for arthralgias.  Skin: Negative for rash.  Neurological: Negative for tremors, syncope and headaches.  Hematological: Does not bruise/bleed easily.       Objective:   Physical Exam  amb wf nad / mild psuedowheeze  Wt Readings from Last 3 Encounters:  02/19/16 161 lb 3.2 oz (73.12 kg)    Vital signs reviewed    HEENT: upper dentures and nl lower dentition/ nl  turbinates, and oropharynx. Nl external ear canals without cough reflex   NECK :  without JVD/Nodes/TM/ nl carotid upstrokes bilaterally   LUNGS: no acc muscle use,  Nl contour chest which is clear to A and P bilaterally without cough on insp or exp maneuvers   CV:  RRR  no s3 or murmur or increase in P2, no edema   ABD:  soft and nontender with nl inspiratory excursion in the supine position. No bruits or organomegaly, bowel sounds nl  MS:  Nl gait/ ext warm without deformities, calf tenderness, cyanosis or clubbing No obvious joint restrictions   SKIN: warm and dry without lesions    NEURO:  alert, approp, nl sensorium with  no motor deficits     CXR PA and Lateral:   02/19/2016 :    I personally reviewed images and agree with radiology impression as follows:    Mild prominence of the pulmonary interstitial markings which may be acute or chronic. If there is  no alveolar pneumonia, pulmonary edema, nor other acute cardiopulmonary abnormality.       Assessment & Plan:

## 2016-02-19 NOTE — Progress Notes (Signed)
Quick Note:  ATC, NA and VM not set up yet ______ 

## 2016-02-19 NOTE — Telephone Encounter (Signed)
Spoke with pt and notified of results per Dr. Wert. Pt verbalized understanding and denied any questions. 

## 2016-02-19 NOTE — Telephone Encounter (Signed)
Pt returning call to get cxr results.[name] [name]

## 2016-02-19 NOTE — Telephone Encounter (Signed)
Call pt: Reviewed cxr and no acute change so no change in recommendations made at [facility]  ATC- fast busy signal x 3

## 2016-02-20 DIAGNOSIS — I10 Essential (primary) hypertension: Secondary | ICD-10-CM | POA: Insufficient documentation

## 2016-02-20 NOTE — Assessment & Plan Note (Addendum)
In the best review of chronic cough to date ( NEJM 2016 375 [phone]) ,  ACEi are now felt to cause cough in up to  20% of pts which is a 4 fold increase from previous reports and does not include the variety of non-specific complaints we see in pulmonary clinic in pts on ACEi but previously attributed to another dx like  Copd/asthma and  include PNDS, throat and chest congestion, "bronchitis", unexplained dyspnea and noct "strangling" sensations, and hoarseness, but also  atypical /refractory GERD symptoms like dysphagia and "bad heartburn"   The only way I know  to prove this is not an "ACEi Case" is a trial off ACEi x a minimum of 6 weeks then regroup.   Try valsartan 160 mg daily

## 2016-02-20 NOTE — Progress Notes (Signed)
Quick Note:  Pt notified of results ______ 

## 2016-02-20 NOTE — Assessment & Plan Note (Addendum)
PFTs 01/21/16 no airflow obst, f/v loop not physiologic  Symptoms are markedly disproportionate to objective findings and not clear this is a lung problem but pt does appear to have difficult airway management issues. DDX of  difficult airways management almost all start with A and  include Adherence, Ace Inhibitors, Acid Reflux, Active Sinus Disease, Alpha 1 Antitripsin deficiency, Anxiety masquerading as Airways dz,  ABPA,  Allergy(esp in young), Aspiration (esp in elderly), Adverse effects of meds,  Active smokers, A bunch of PE's (a small clot burden can't cause this syndrome unless there is already severe underlying pulm or vascular dz with poor reserve) plus two Bs  = Bronchiectasis and Beta blocker use..and one C= CHF   Adherence is always the initial "prime suspect" and is a multilayered concern that requires a "trust but verify" approach in every patient - starting with knowing how to use medications, especially inhalers, correctly, keeping up with refills and understanding the fundamental difference between maintenance and prns vs those medications only taken for a very short course and then stopped and not refilled.   ACEi also at top of the usual list of suspects > only way to know is trial off > see hbp  ? Allergies/asthma unlikely given absence of cough/ noct symptoms or variability   ? Anxiety dx of exclusion  ? chf > cards w/u neg   Total time devoted to counseling  = 35/79m review case with pt/husband and discussion of options/alternatives/ personally creating written instructions  in presence of pt  then going over those specific  Instructions directly with the pt including how to use all of the meds but in particular covering each new medication in detail and the difference between the maintenance/automatic meds and the prns using an action plan format for the latter.

## 2017-07-13 ENCOUNTER — Encounter: Payer: Self-pay | Admitting: Internal Medicine

## 2017-08-05 ENCOUNTER — Other Ambulatory Visit (HOSPITAL_COMMUNITY): Payer: Self-pay | Admitting: Family Medicine

## 2017-08-05 DIAGNOSIS — Z780 Asymptomatic menopausal state: Secondary | ICD-10-CM

## 2017-08-06 ENCOUNTER — Other Ambulatory Visit (HOSPITAL_COMMUNITY): Payer: Self-pay | Admitting: Neurosurgery

## 2017-08-06 DIAGNOSIS — Z780 Asymptomatic menopausal state: Secondary | ICD-10-CM

## 2017-08-12 ENCOUNTER — Ambulatory Visit (HOSPITAL_COMMUNITY)
Admission: RE | Admit: 2017-08-12 | Discharge: 2017-08-12 | Disposition: A | Discharge status: Home / Self Care | Payer: Medicare Other | Source: Ambulatory Visit | Attending: Family Medicine | Admitting: Family Medicine

## 2017-08-12 ENCOUNTER — Encounter (HOSPITAL_COMMUNITY): Payer: Self-pay | Admitting: Radiology

## 2017-08-12 DIAGNOSIS — Z780 Asymptomatic menopausal state: Secondary | ICD-10-CM | POA: Diagnosis present

## 2017-08-12 DIAGNOSIS — M810 Age-related osteoporosis without current pathological fracture: Secondary | ICD-10-CM | POA: Diagnosis not present

## 2017-08-26 ENCOUNTER — Ambulatory Visit: Payer: Medicare Other | Admitting: Gastroenterology

## 2017-08-26 ENCOUNTER — Encounter: Payer: Self-pay | Admitting: Gastroenterology

## 2017-08-26 DIAGNOSIS — K529 Noninfective gastroenteritis and colitis, unspecified: Secondary | ICD-10-CM | POA: Insufficient documentation

## 2017-08-26 NOTE — Progress Notes (Signed)
Primary Care Physician:  Rory Percy, MD  Primary Gastroenterologist:  Garfield Cornea, MD   Chief Complaint  Patient presents with  . Diarrhea    ongoing, Ref by PCP    HPI:  [name] [name] is a 81 y.o. female here at the request of Dr. Nadara Mustard for further evaluation of chronic diarrhea.  Symptoms have been going on for at least 7-8 years but more problematic over the past year.  She reports seen Dr. Britta Mccreedy in 2017 for diarrhea.  She had a colonoscopy but does not know the findings.  She presents with her husband today.  She believes she has some lactose intolerance.  She "lives" on Imodium.  She has it with her at all times.  Sometimes she may use it several times per week, sometimes she does not need it.  Sometimes up to 4 Imodium per day.  She has intermittent constipation but believes that is usually due to the Imodium use.  No blood in the stool except for when she strains.  No melena.  Rare nocturnal stools.  She avoids milk but uses a lot of better.  She has tried to cut back on her ice cream.  On bad days she is having up to 5 stools a day.  Takes a fiber capsule per day.  She takes tramadol on occasion for back pain.  She has been doing physical therapy which is helped out a lot.      Current Outpatient Medications  Medication Sig Dispense Refill  . aspirin EC 81 MG tablet Take 81 mg by mouth daily.    Marland Kitchen atorvastatin (LIPITOR) 40 MG tablet Take 20 mg by mouth daily.     . Calcium Citrate-Vitamin D (CALCIUM CITRATE + PO) Take 1 tablet by mouth daily.    . DULoxetine (CYMBALTA) 60 MG capsule Take 60 mg by mouth daily.    . hydrochlorothiazide (HYDRODIURIL) 25 MG tablet Take 25 mg by mouth daily.    . insulin glargine (LANTUS) 100 UNIT/ML injection Inject 40 Units into the skin at bedtime.    Marland Kitchen labetalol (NORMODYNE) 200 MG tablet Take 200 mg by mouth 2 (two) times daily.    . Liraglutide (VICTOZA) 18 MG/3ML SOPN Inject 1.8 mg into the skin daily.    . ranitidine (ZANTAC) 150 MG  tablet Take 150 mg by mouth 2 (two) times daily.     No current facility-administered medications for this visit.     Allergies as of 08/26/2017  . (No Known Allergies)    Past Medical History:  Diagnosis Date  . Chronic back pain   . Diabetes [facility])     Past Surgical History:  Procedure Laterality Date  . ABDOMINAL HYSTERECTOMY    . CARDIAC CATHETERIZATION    . CHOLECYSTECTOMY    . COLONOSCOPY  11/2015  . TONSILLECTOMY    . TRIGGER FINGER RELEASE      Family History  Problem Relation Age of Onset  . Heart attack Sister   . Heart attack Mother   . Heart attack Father   . Colon cancer Neg Hx   . Celiac disease Neg Hx   . Inflammatory bowel disease Neg Hx     Social History   Socioeconomic History  . Marital status: Married    Spouse name: Not on file  . Number of children: Not on file  . Years of education: Not on file  . Highest education level: Not on file  Social Needs  . Financial resource strain: Not  on file  . Food insecurity - worry: Not on file  . Food insecurity - inability: Not on file  . Transportation needs - medical: Not on file  . Transportation needs - non-medical: Not on file  Occupational History  . Not on file  Tobacco Use  . Smoking status: Never Smoker  . Smokeless tobacco: Never Used  Substance and Sexual Activity  . Alcohol use: No    Alcohol/week: 0.0 oz  . Drug use: No  . Sexual activity: Not on file  Other Topics Concern  . Not on file  Social History Narrative  . Not on file      ROS:  General: Negative for anorexia, weight loss, fever, chills, fatigue, weakness. Eyes: Negative for vision changes.  ENT: Negative for hoarseness, difficulty swallowing , nasal congestion. CV: Negative for chest pain, angina, palpitations, dyspnea on exertion, peripheral edema.  Respiratory: Negative for dyspnea at rest, dyspnea on exertion, cough, sputum, wheezing.  GI: See history of present illness. GU:  Negative for dysuria, hematuria,  urinary incontinence, urinary frequency, nocturnal urination.  MS: Negative for joint pain, low back pain.  Derm: Negative for rash or itching.  Neuro: Negative for weakness, abnormal sensation, seizure, frequent headaches, memory loss, confusion.  Psych: Negative for anxiety, depression, suicidal ideation, hallucinations.  Endo: Negative for unusual weight change.  Heme: Negative for bruising or bleeding. Allergy: Negative for rash or hives.    Physical Examination:  BP (!) 143/75   Pulse 88   Temp (!) 97 F (36.1 C) (Oral)   Ht 5\' 2"  (1.575 m)   Wt 155 lb 3.2 oz (70.4 kg)   BMI 28.39 kg/m    General: Well-nourished, well-developed in no acute distress.  Head: Normocephalic, atraumatic.   Eyes: Conjunctiva pink, no icterus. Mouth: Oropharyngeal mucosa moist and pink , no lesions erythema or exudate. Neck: Supple without thyromegaly, masses, or lymphadenopathy.  Lungs: Clear to auscultation bilaterally.  Heart: Regular rate and rhythm, no murmurs rubs or gallops.  Abdomen: Bowel sounds are normal, nontender, nondistended, no hepatosplenomegaly or masses, no abdominal bruits or    hernia , no rebound or guarding.   Rectal: not performed Extremities: No lower extremity edema. No clubbing or deformities.  Neuro: Alert and oriented x 4 , grossly normal neurologically.  Skin: Warm and dry, no rash or jaundice.   Psych: Alert and cooperative, normal mood and affect.  Labs: Labs from August 2018 White blood cell count 7500, hemoglobin 11.5 normal, hematocrit 35.6, MCV 87, platelets 305,000.  Hemoglobin A1c 7.9.  BUN 28 high, creatinine 0.91, glucose 133, total bilirubin 0.3, alkaline phosphatase 115, AST 16, ALT 17, albumin 4.3.  Ferritin 77.  Imaging Studies: Dg Bone Density  Result Date: 08/12/2017 EXAM: DUAL X-RAY ABSORPTIOMETRY (DXA) FOR BONE MINERAL DENSITY IMPRESSION: Ordering Physician:  Dr. Glenna Fellows, Your patient [name] completed a BMD test on 08/12/2017 using the [place] (software version: 14.10) manufactured by [employer]. The following summarizes the results of our evaluation. PATIENT BIOGRAPHICAL: Name: [name] [name], [name] [name] Patient ID: [id number] Birth Date: 1936-06-16 Height: 63.0 in. Gender: Female Exam Date: 08/12/2017 Weight: 153.0 lbs. Indications: Bilateral Oophrectomy, Caucasian, Height Loss, History of Fracture (Adult), Post Menopausal Fractures: Shoulder Treatments: Calcium, Multivitamin, Vitamin D DENSITOMETRY RESULTS: Site         Region     Measured Date Measured Age WHO Classification Young Adult T-score BMD         %Change vs. Previous Significant Change (*)  DualFemur Neck Right 08/12/2017 [age] Osteoporosis -2.6 0.672 g/cm2 Left Forearm Radius 33% 08/12/2017 79.5 Osteoporosis -3.9 0.438 g/cm2 ASSESSMENT: BMD as determined from Forearm Radius 33% is 0.438 g/cm2 with a T-Score of -3.9. This patient is considered osteoporotic according to [place] [facility]) criteria. (Lumbar spine was not utilized due to advanced degenerative changes.) (Patient is not a candidate for FRAX assessment due to diagnosis of osteoporosis on bone density exam.) World Health Organization [facility]) criteria for post-menopausal, Caucasian Women: Normal:       T-score at or above -1 SD Osteopenia:   T-score between -1 and -2.5 SD Osteoporosis: T-score at or below -2.5 SD RECOMMENDATIONS: [place] recommends that FDA-approved medial therapies be considered in postmenopausal women and men age 62 or older with a: 1. Hip or vertebral (clinical or morphometric) fracture. 2. T-Score of < -2.5 at the spine or hip. 3. Ten-year fracture probability by FRAX of 3% or greater for hip fracture or 20% or greater for major osteoporotic fracture. All treatment decisions require clinical judgment and consideration of individual patient factors, including patient preferences, co-morbidities, previous drug use, risk factors not captured in the FRAX model  (e.g. falls, vitamin D deficiency, increased bone turnover, interval significant decline in bone density) and possible under-or over-estimation of fracture risk by FRAX. All patients should ensure an adequate intake of dietary calcium (1200 mg/d) and vitamin D (800 IU daily) unless contraindicated. FOLLOW-UP: People with diagnosed cases of osteoporosis or osteopenia should be regularly tested for bone mineral density. For patients eligible for Medicare, routine testing is allowed once every 2 years. Testing frequency can be increased for patients who have rapidly progressing disease, or for those who are receiving medical therapy to restore bone mass. I have reviewed this report, and agree with the above findings. [facility] Electronically Signed   By: Lowella Grip III M.D.   On: 08/12/2017 13:15

## 2017-08-26 NOTE — Patient Instructions (Signed)
1. We will be in touch after I have reviewed your records.

## 2017-08-31 NOTE — Assessment & Plan Note (Signed)
81 year old female with several year history of chronic diarrhea, intermittent in nature, with potential constipation although this may be more of a side effect from the Imodium use.  Occasional bright red blood per rectum with straining.  No abdominal pain.  No weight loss.  Chronic mesenteric ischemia less likely.  Hemoglobin normal.  She reports a colonoscopy last year, we are trying to retrieve records for further review.  Want to make sure that she had random colon biopsies to exclude microscopic colitis.  Consider checking for celiac disease although given her age if not done.  This is unlikely infectious etiology.  Once I reviewed her records, further recommendations to follow.  She will continue to move utilize Imodium on an as-needed basis for

## 2017-08-31 NOTE — Progress Notes (Signed)
cc'ed to pcp °

## 2017-09-02 ENCOUNTER — Telehealth: Payer: Self-pay | Admitting: Internal Medicine

## 2017-09-02 DIAGNOSIS — R197 Diarrhea, unspecified: Secondary | ICD-10-CM

## 2017-09-02 NOTE — Telephone Encounter (Signed)
Pt is following up to see if her records have been reviewed.

## 2017-09-02 NOTE — Telephone Encounter (Signed)
PATIENT CALLED AND STATED SHE WAS TOLD TO CALL IF SHE HAD NOT HEARD FROM [place].  SHE WANTS TO KNOW IF ANYTHING HAD BEEN DECIDED ABOUT WHAT THE NEXT STEP WILL BE IN HER PLAN OF CARE

## 2017-09-03 ENCOUNTER — Encounter: Payer: Self-pay | Admitting: Internal Medicine

## 2017-09-03 ENCOUNTER — Telehealth: Payer: Self-pay | Admitting: Internal Medicine

## 2017-09-03 NOTE — Telephone Encounter (Addendum)
Colonoscopy by Dr. Britta Mccreedy 11/2015. Hyperplastic polyp removed from sigmoid colon. Biopsies taken from ascending colon, negative for microscopic colitis.   Please let patient know that I would like for her to have some labs and stool test done. Based on results, we will decide if she need another colonoscopy.

## 2017-09-03 NOTE — Telephone Encounter (Signed)
Attempting calling pt twice, VM is full on preferred number, wasn't able to leave a VM on home number as well.

## 2017-09-03 NOTE — Telephone Encounter (Signed)
Spoke with pt, [facility].

## 2017-09-03 NOTE — Telephone Encounter (Signed)
Pt called today saying she has been waiting a week to hear from [place]. I told her that the nurse had tried calling her twice yesterday and the VM was full and couldn't leave a message on the home number. I told her the nurse was with another patient and what was the best number to reach her at. She said she would be at home the rest of the day and to call [phone]

## 2017-09-03 NOTE — Addendum Note (Signed)
Addended by: Mahala Menghini on: 09/03/2017 12:38 PM   Modules accepted: Orders

## 2017-09-03 NOTE — Telephone Encounter (Signed)
Closing note, pt was notified of results

## 2017-09-10 DIAGNOSIS — M810 Age-related osteoporosis without current pathological fracture: Secondary | ICD-10-CM | POA: Insufficient documentation

## 2017-10-15 ENCOUNTER — Encounter (HOSPITAL_COMMUNITY): Payer: Self-pay | Admitting: Emergency Medicine

## 2017-10-15 ENCOUNTER — Other Ambulatory Visit: Payer: Self-pay

## 2017-10-15 ENCOUNTER — Emergency Department (HOSPITAL_COMMUNITY): Payer: Medicare Other

## 2017-10-15 ENCOUNTER — Observation Stay (HOSPITAL_COMMUNITY)
Admission: EM | Admit: 2017-10-15 | Discharge: 2017-10-16 | Disposition: A | Discharge status: Home Health | Payer: Medicare Other | Attending: Internal Medicine | Admitting: Internal Medicine

## 2017-10-15 DIAGNOSIS — N3000 Acute cystitis without hematuria: Secondary | ICD-10-CM

## 2017-10-15 DIAGNOSIS — Z794 Long term (current) use of insulin: Secondary | ICD-10-CM | POA: Insufficient documentation

## 2017-10-15 DIAGNOSIS — M1990 Unspecified osteoarthritis, unspecified site: Secondary | ICD-10-CM | POA: Diagnosis not present

## 2017-10-15 DIAGNOSIS — I10 Essential (primary) hypertension: Secondary | ICD-10-CM | POA: Insufficient documentation

## 2017-10-15 DIAGNOSIS — K219 Gastro-esophageal reflux disease without esophagitis: Secondary | ICD-10-CM | POA: Diagnosis not present

## 2017-10-15 DIAGNOSIS — E119 Type 2 diabetes mellitus without complications: Secondary | ICD-10-CM | POA: Insufficient documentation

## 2017-10-15 DIAGNOSIS — D649 Anemia, unspecified: Secondary | ICD-10-CM | POA: Insufficient documentation

## 2017-10-15 DIAGNOSIS — M549 Dorsalgia, unspecified: Secondary | ICD-10-CM | POA: Insufficient documentation

## 2017-10-15 DIAGNOSIS — R410 Disorientation, unspecified: Secondary | ICD-10-CM

## 2017-10-15 DIAGNOSIS — Z7982 Long term (current) use of aspirin: Secondary | ICD-10-CM | POA: Diagnosis not present

## 2017-10-15 DIAGNOSIS — R4182 Altered mental status, unspecified: Secondary | ICD-10-CM | POA: Insufficient documentation

## 2017-10-15 DIAGNOSIS — N390 Urinary tract infection, site not specified: Secondary | ICD-10-CM | POA: Diagnosis present

## 2017-10-15 DIAGNOSIS — E785 Hyperlipidemia, unspecified: Secondary | ICD-10-CM | POA: Insufficient documentation

## 2017-10-15 DIAGNOSIS — R531 Weakness: Secondary | ICD-10-CM | POA: Insufficient documentation

## 2017-10-15 DIAGNOSIS — Z79899 Other long term (current) drug therapy: Secondary | ICD-10-CM | POA: Diagnosis not present

## 2017-10-15 DIAGNOSIS — E876 Hypokalemia: Secondary | ICD-10-CM | POA: Insufficient documentation

## 2017-10-15 DIAGNOSIS — G8929 Other chronic pain: Secondary | ICD-10-CM | POA: Insufficient documentation

## 2017-10-15 DIAGNOSIS — G9341 Metabolic encephalopathy: Principal | ICD-10-CM | POA: Insufficient documentation

## 2017-10-15 HISTORY — DX: Gastro-esophageal reflux disease without esophagitis: K21.9

## 2017-10-15 HISTORY — DX: Hyperlipidemia, unspecified: E78.5

## 2017-10-15 HISTORY — DX: Noninfective gastroenteritis and colitis, unspecified: K52.9

## 2017-10-15 HISTORY — DX: Essential (primary) hypertension: I10

## 2017-10-15 LAB — COMPREHENSIVE METABOLIC PANEL
ALBUMIN: 3.4 g/dL — AB (ref 3.5–5.0)
ALK PHOS: 90 U/L (ref 38–126)
ALT: 24 U/L (ref 14–54)
AST: 24 U/L (ref 15–41)
Anion gap: 12 (ref 5–15)
BILIRUBIN TOTAL: 0.7 mg/dL (ref 0.3–1.2)
BUN: 18 mg/dL (ref 6–20)
CALCIUM: 8.6 mg/dL — AB (ref 8.9–10.3)
CO2: 26 mmol/L (ref 22–32)
Chloride: 93 mmol/L — ABNORMAL LOW (ref 101–111)
Creatinine, Ser: 0.87 mg/dL (ref 0.44–1.00)
GLUCOSE: 205 mg/dL — AB (ref 65–99)
Potassium: 3.3 mmol/L — ABNORMAL LOW (ref 3.5–5.1)
Sodium: 131 mmol/L — ABNORMAL LOW (ref 135–145)
TOTAL PROTEIN: 6.5 g/dL (ref 6.5–8.1)

## 2017-10-15 LAB — CBC WITH DIFFERENTIAL/PLATELET
BASOS ABS: 0 10*3/uL (ref 0.0–0.1)
Basophils Relative: 0 %
EOS PCT: 0 %
Eosinophils Absolute: 0 10*3/uL (ref 0.0–0.7)
HCT: 34.1 % — ABNORMAL LOW (ref 36.0–46.0)
Hemoglobin: 11.1 g/dL — ABNORMAL LOW (ref 12.0–15.0)
LYMPHS PCT: 8 %
Lymphs Abs: 0.6 10*3/uL — ABNORMAL LOW (ref 0.7–4.0)
MCH: 27.6 pg (ref 26.0–34.0)
MCHC: 32.6 g/dL (ref 30.0–36.0)
MCV: 84.8 fL (ref 78.0–100.0)
MONO ABS: 0.5 10*3/uL (ref 0.1–1.0)
Monocytes Relative: 7 %
Neutro Abs: 7 10*3/uL (ref 1.7–7.7)
Neutrophils Relative %: 85 %
PLATELETS: 193 10*3/uL (ref 150–400)
RBC: 4.02 MIL/uL (ref 3.87–5.11)
RDW: 12.6 % (ref 11.5–15.5)
WBC: 8.2 10*3/uL (ref 4.0–10.5)

## 2017-10-15 LAB — TROPONIN I

## 2017-10-15 LAB — LACTIC ACID, PLASMA
LACTIC ACID, VENOUS: 1.1 mmol/L (ref 0.5–1.9)
Lactic Acid, Venous: 1.1 mmol/L (ref 0.5–1.9)

## 2017-10-15 LAB — RAPID URINE DRUG SCREEN, HOSP PERFORMED
AMPHETAMINES: NOT DETECTED
BENZODIAZEPINES: NOT DETECTED
Barbiturates: NOT DETECTED
COCAINE: NOT DETECTED
OPIATES: NOT DETECTED
Tetrahydrocannabinol: NOT DETECTED

## 2017-10-15 NOTE — ED Notes (Signed)
Assist pt with bedside comode

## 2017-10-15 NOTE — ED Notes (Signed)
Daughter states pt had a Reclast infusion yesterday for osteoporosis.

## 2017-10-15 NOTE — ED Triage Notes (Signed)
Pt was seen at [facility] yesterday for altered mental status after taking tramadol (2 pills)   Pt sent home with diagnosis of "overdose".  Per family, pt has no improvement from yesterday and wants her to be rechecked.  Per ems, pt is still altered, has strong smell to her urine although it was apparently checked to be clear at [facility] yesterday.

## 2017-10-16 ENCOUNTER — Encounter (HOSPITAL_COMMUNITY): Payer: Self-pay | Admitting: Internal Medicine

## 2017-10-16 ENCOUNTER — Other Ambulatory Visit: Payer: Self-pay

## 2017-10-16 DIAGNOSIS — E876 Hypokalemia: Secondary | ICD-10-CM | POA: Diagnosis present

## 2017-10-16 DIAGNOSIS — N3000 Acute cystitis without hematuria: Secondary | ICD-10-CM

## 2017-10-16 DIAGNOSIS — R4182 Altered mental status, unspecified: Secondary | ICD-10-CM

## 2017-10-16 DIAGNOSIS — I10 Essential (primary) hypertension: Secondary | ICD-10-CM

## 2017-10-16 DIAGNOSIS — K219 Gastro-esophageal reflux disease without esophagitis: Secondary | ICD-10-CM | POA: Diagnosis present

## 2017-10-16 DIAGNOSIS — G9341 Metabolic encephalopathy: Secondary | ICD-10-CM | POA: Diagnosis present

## 2017-10-16 DIAGNOSIS — E785 Hyperlipidemia, unspecified: Secondary | ICD-10-CM | POA: Diagnosis present

## 2017-10-16 DIAGNOSIS — N390 Urinary tract infection, site not specified: Secondary | ICD-10-CM | POA: Diagnosis present

## 2017-10-16 LAB — URINALYSIS, ROUTINE W REFLEX MICROSCOPIC
Bilirubin Urine: NEGATIVE
GLUCOSE, UA: NEGATIVE mg/dL
Ketones, ur: NEGATIVE mg/dL
Nitrite: NEGATIVE
PROTEIN: 100 mg/dL — AB
Specific Gravity, Urine: 1.014 (ref 1.005–1.030)
pH: 5 (ref 5.0–8.0)

## 2017-10-16 LAB — PHOSPHORUS: Phosphorus: 2.9 mg/dL (ref 2.5–4.6)

## 2017-10-16 LAB — CBC WITH DIFFERENTIAL/PLATELET
BASOS PCT: 0 %
Basophils Absolute: 0 10*3/uL (ref 0.0–0.1)
Eosinophils Absolute: 0.1 10*3/uL (ref 0.0–0.7)
Eosinophils Relative: 1 %
HEMATOCRIT: 30.4 % — AB (ref 36.0–46.0)
HEMOGLOBIN: 9.8 g/dL — AB (ref 12.0–15.0)
LYMPHS ABS: 0.9 10*3/uL (ref 0.7–4.0)
Lymphocytes Relative: 18 %
MCH: 27.5 pg (ref 26.0–34.0)
MCHC: 32.2 g/dL (ref 30.0–36.0)
MCV: 85.4 fL (ref 78.0–100.0)
MONOS PCT: 8 %
Monocytes Absolute: 0.4 10*3/uL (ref 0.1–1.0)
NEUTROS ABS: 3.6 10*3/uL (ref 1.7–7.7)
NEUTROS PCT: 73 %
Platelets: 171 10*3/uL (ref 150–400)
RBC: 3.56 MIL/uL — ABNORMAL LOW (ref 3.87–5.11)
RDW: 12.7 % (ref 11.5–15.5)
WBC: 5 10*3/uL (ref 4.0–10.5)

## 2017-10-16 LAB — BASIC METABOLIC PANEL
Anion gap: 10 (ref 5–15)
BUN: 18 mg/dL (ref 6–20)
CALCIUM: 7.8 mg/dL — AB (ref 8.9–10.3)
CHLORIDE: 97 mmol/L — AB (ref 101–111)
CO2: 24 mmol/L (ref 22–32)
CREATININE: 0.9 mg/dL (ref 0.44–1.00)
GFR calc non Af Amer: 59 mL/min — ABNORMAL LOW (ref >60)
GLUCOSE: 136 mg/dL — AB (ref 65–99)
Potassium: 3.3 mmol/L — ABNORMAL LOW (ref 3.5–5.1)
Sodium: 131 mmol/L — ABNORMAL LOW (ref 135–145)

## 2017-10-16 LAB — ACETAMINOPHEN LEVEL

## 2017-10-16 LAB — GLUCOSE, CAPILLARY
Glucose-Capillary: 134 mg/dL — ABNORMAL HIGH (ref 65–99)
Glucose-Capillary: 164 mg/dL — ABNORMAL HIGH (ref 65–99)

## 2017-10-16 LAB — MAGNESIUM
Magnesium: 1.1 mg/dL — ABNORMAL LOW (ref 1.7–2.4)
Magnesium: 2.1 mg/dL (ref 1.7–2.4)

## 2017-10-16 LAB — MRSA PCR SCREENING: MRSA BY PCR: NEGATIVE

## 2017-10-16 LAB — ETHANOL

## 2017-10-16 LAB — SALICYLATE LEVEL

## 2017-10-16 MED ORDER — ONDANSETRON HCL 4 MG PO TABS: 4.0000 mg | ORAL_TABLET | Freq: Four times a day (QID) | ORAL | Status: DC | PRN

## 2017-10-16 MED ORDER — MAGNESIUM SULFATE 2 GM/50ML IV SOLN
2.0000 g | INTRAVENOUS | Status: AC
  Administered 2017-10-16 (×2): 2 g via INTRAVENOUS
  Filled 2017-10-16: qty 50

## 2017-10-16 MED ORDER — HYDROCHLOROTHIAZIDE 25 MG PO TABS
25.0000 mg | ORAL_TABLET | Freq: Every morning | ORAL | Status: DC
  Administered 2017-10-16: 25 mg via ORAL
  Filled 2017-10-16: qty 1

## 2017-10-16 MED ORDER — DEXTROSE 5 % IV SOLN
1.0000 g | INTRAVENOUS | Status: DC
  Filled 2017-10-16 (×3): qty 10

## 2017-10-16 MED ORDER — CEPHALEXIN 500 MG PO CAPS: 500.0000 mg | ORAL_CAPSULE | Freq: Two times a day (BID) | ORAL | 0 refills | Status: DC

## 2017-10-16 MED ORDER — ENALAPRIL MALEATE 5 MG PO TABS
20.0000 mg | ORAL_TABLET | Freq: Every day | ORAL | Status: DC
  Administered 2017-10-16: 20 mg via ORAL
  Filled 2017-10-16: qty 4

## 2017-10-16 MED ORDER — ATORVASTATIN CALCIUM 20 MG PO TABS
20.0000 mg | ORAL_TABLET | Freq: Every day | ORAL | Status: DC
  Administered 2017-10-16: 20 mg via ORAL
  Filled 2017-10-16: qty 1

## 2017-10-16 MED ORDER — LIRAGLUTIDE 18 MG/3ML ~~LOC~~ SOPN: 1.8000 mg | PEN_INJECTOR | Freq: Every day | SUBCUTANEOUS | Status: DC

## 2017-10-16 MED ORDER — ONDANSETRON HCL 4 MG/2ML IJ SOLN: 4.0000 mg | Freq: Four times a day (QID) | INTRAMUSCULAR | Status: DC | PRN

## 2017-10-16 MED ORDER — KETOROLAC TROMETHAMINE 15 MG/ML IJ SOLN
15.0000 mg | Freq: Once | INTRAMUSCULAR | Status: AC
  Administered 2017-10-16: 15 mg via INTRAVENOUS
  Filled 2017-10-16: qty 1

## 2017-10-16 MED ORDER — MAGNESIUM SULFATE 4 GM/100ML IV SOLN
4.0000 g | Freq: Once | INTRAVENOUS | Status: DC
  Filled 2017-10-16: qty 100

## 2017-10-16 MED ORDER — MAGNESIUM SULFATE 2 GM/50ML IV SOLN: 2.0000 g | INTRAVENOUS | Status: DC

## 2017-10-16 MED ORDER — ASPIRIN EC 81 MG PO TBEC
81.0000 mg | DELAYED_RELEASE_TABLET | Freq: Every day | ORAL | Status: DC
  Administered 2017-10-16: 81 mg via ORAL
  Filled 2017-10-16: qty 1

## 2017-10-16 MED ORDER — SODIUM CHLORIDE 0.9 % IV BOLUS (SEPSIS)
1000.0000 mL | Freq: Once | INTRAVENOUS | Status: AC
  Administered 2017-10-16: 1000 mL via INTRAVENOUS

## 2017-10-16 MED ORDER — INSULIN GLARGINE 100 UNIT/ML ~~LOC~~ SOLN
40.0000 [IU] | Freq: Every day | SUBCUTANEOUS | Status: DC
  Filled 2017-10-16 (×3): qty 0.4

## 2017-10-16 MED ORDER — ENOXAPARIN SODIUM 40 MG/0.4ML ~~LOC~~ SOLN
40.0000 mg | SUBCUTANEOUS | Status: DC
  Filled 2017-10-16: qty 0.4

## 2017-10-16 MED ORDER — LABETALOL HCL 200 MG PO TABS
200.0000 mg | ORAL_TABLET | Freq: Two times a day (BID) | ORAL | Status: DC
  Administered 2017-10-16 (×2): 200 mg via ORAL
  Filled 2017-10-16 (×2): qty 1

## 2017-10-16 MED ORDER — FAMOTIDINE 20 MG PO TABS
20.0000 mg | ORAL_TABLET | Freq: Two times a day (BID) | ORAL | Status: DC
  Administered 2017-10-16 (×2): 20 mg via ORAL
  Filled 2017-10-16 (×2): qty 1

## 2017-10-16 MED ORDER — DEXTROSE 5 % IV SOLN
1.0000 g | Freq: Once | INTRAVENOUS | Status: AC
  Administered 2017-10-16: 1 g via INTRAVENOUS
  Filled 2017-10-16: qty 10

## 2017-10-16 MED ORDER — INSULIN ASPART 100 UNIT/ML ~~LOC~~ SOLN
0.0000 [IU] | Freq: Three times a day (TID) | SUBCUTANEOUS | Status: DC
  Administered 2017-10-16: 1 [IU] via SUBCUTANEOUS
  Administered 2017-10-16: 2 [IU] via SUBCUTANEOUS

## 2017-10-16 MED ORDER — MAGNESIUM OXIDE 400 (241.3 MG) MG PO TABS
400.0000 mg | ORAL_TABLET | Freq: Every day | ORAL | Status: DC
  Administered 2017-10-16: 400 mg via ORAL
  Filled 2017-10-16: qty 1

## 2017-10-16 MED ORDER — POTASSIUM CHLORIDE CRYS ER 20 MEQ PO TBCR
40.0000 meq | EXTENDED_RELEASE_TABLET | Freq: Once | ORAL | Status: AC
  Administered 2017-10-16: 40 meq via ORAL
  Filled 2017-10-16: qty 2

## 2017-10-16 MED ORDER — CEPHALEXIN 500 MG PO CAPS: 500.0000 mg | ORAL_CAPSULE | Freq: Two times a day (BID) | ORAL | 0 refills | Status: AC

## 2017-10-16 MED ORDER — DULOXETINE HCL 60 MG PO CPEP
60.0000 mg | ORAL_CAPSULE | Freq: Every day | ORAL | Status: DC
  Administered 2017-10-16: 60 mg via ORAL
  Filled 2017-10-16: qty 1

## 2017-10-16 MED ORDER — POTASSIUM CHLORIDE IN NACL 40-0.9 MEQ/L-% IV SOLN
INTRAVENOUS | Status: AC
  Administered 2017-10-16: 125 mL/h via INTRAVENOUS

## 2017-10-16 NOTE — Plan of Care (Signed)
  Acute Rehab PT Goals(only PT should resolve) Pt Will Go Supine/Side To Sit 10/16/2017 1215 - Progressing by Lonell Grandchild, PT Flowsheets Taken 10/16/2017 1215  Pt will go Supine/Side to Sit with supervision Patient Will Transfer Sit To/From Stand 10/16/2017 1215 - Progressing by Lonell Grandchild, PT Flowsheets Taken 10/16/2017 1215  Patient will transfer sit to/from stand with supervision Pt Will Transfer Bed To Chair/Chair To Bed 10/16/2017 1215 - Progressing by Lonell Grandchild, PT Flowsheets Taken 10/16/2017 1215  Pt will Transfer Bed to Chair/Chair to Bed with supervision Pt Will Ambulate 10/16/2017 1215 - Progressing by Lonell Grandchild, PT Flowsheets Taken 10/16/2017 1215  Pt will Ambulate 50 feet;with supervision;with rolling walker  12:15 PM, 10/16/17 Lonell Grandchild, MPT Physical Therapist with [facility] [phone] office [phone] mobile phone

## 2017-10-16 NOTE — Evaluation (Signed)
Physical Therapy Evaluation Patient Details Name: [name] [name] MRN: [id number] DOB: 27-Apr-1936 Today's Date: 10/16/2017   History of Present Illness  [name] [name] is a 82 y.o. female with medical history significant of chronic back pain, chronic diarrhea, type 2 diabetes, GERD, hyperlipidemia, hypertension who is brought to the emergency department due to altered mental status.   Apparently, the patient had a Reclast infusion earlier this week for osteoporosis treatment.  After that, the patient states that she developed upper back and bilateral shoulder pain.  She took some Ultram for pain and he seems she became altered.  She was seen at [facility] and discharged home, but AMS persisted, despite that she did not take any sedating medications during the day and she was brought to this facility yesterday evening.  She states that she has now had much to drink or eat in the past 2 days.  She denies headache, fever, chills, but feels fatigued.  No sore throat, dyspnea, chest pain, palpitations, dizziness or diaphoresis.  She gets occasional pitting edema of the lower extremities, but no PND or orthopnea.  She denies abdominal pain, nausea, emesis, diarrhea, constipation, melena or hematochezia.  She denies dysuria, frequency or hematuria.  No polyuria, polydipsia or blurred vision.    Clinical Impression  Patient functioning near baseline for functional mobility and gait.  Patient mostly limited due to left knee pain which has been going on for a few days per patient, tolerated sitting up on commode in room and RN notified.  Patient should be safe to go home with spouse supervising.  Patient will benefit from continued physical therapy in hospital and recommended venue below to increase strength, balance, endurance for safe ADLs and gait.    Follow Up Recommendations Home health PT;Supervision/Assistance - 24 hour    Equipment Recommendations  None recommended by PT    Recommendations for Other  Services       Precautions / Restrictions Precautions Precautions: Fall Restrictions Weight Bearing Restrictions: No      Mobility  Bed Mobility Overal bed mobility: Needs Assistance Bed Mobility: Supine to Sit     Supine to sit: Min guard        Transfers Overall transfer level: Needs assistance Equipment used: Rolling walker (2 wheeled) Transfers: Sit to/from [facility] Sit to Stand: Min guard Stand pivot transfers: Min guard          Ambulation/Gait Ambulation/Gait assistance: Min guard Ambulation Distance (Feet): 35 Feet Assistive device: Rolling walker (2 wheeled) Gait Pattern/deviations: Decreased step length - right;Decreased step length - left;Decreased stride length   Gait velocity interpretation: Below normal speed for age/gender General Gait Details: slightly unsteady gait without loss of balance, mostly limited due to left knee pain  Stairs            Wheelchair Mobility    Modified Rankin (Stroke Patients Only)       Balance Overall balance assessment: Needs assistance Sitting-balance support: Feet supported;No upper extremity supported Sitting balance-Leahy Scale: Good     Standing balance support: Bilateral upper extremity supported;During functional activity Standing balance-Leahy Scale: Fair                               Pertinent Vitals/Pain Pain Assessment: 0-10 Pain Score: 6  Pain Location: left knee Pain Descriptors / Indicators: Aching Pain Intervention(s): Limited activity within patient's tolerance;Monitored during session    Home Living Family/patient expects to be discharged  to:: Private residence Living Arrangements: Spouse/significant other Available Help at Discharge: Family Type of Home: House Home Access: Level entry     [place]: One [place]: [place] - built in;Walker - 2 wheels;Walker - 4 wheels;Cane - single point;Bedside commode      Prior Function  Level of Independence: Independent with assistive device(s)         Comments: ambulates with SPC in household, uses 4 wheeled walker outside     Hand Dominance        Extremity/Trunk Assessment   Upper Extremity Assessment Upper Extremity Assessment: Generalized weakness    Lower Extremity Assessment Lower Extremity Assessment: Generalized weakness    Cervical / Trunk Assessment Cervical / Trunk Assessment: Normal  Communication   Communication: No difficulties  Cognition Arousal/Alertness: Awake/alert Behavior During Therapy: WFL for tasks assessed/performed Overall Cognitive Status: Within Functional Limits for tasks assessed                                        General Comments      Exercises     Assessment/Plan    PT Assessment Patient needs continued PT services  PT Problem List Decreased strength;Decreased activity tolerance;Decreased balance;Decreased mobility       PT Treatment Interventions Gait training;Functional mobility training;Therapeutic activities;Therapeutic exercise;Patient/family education    PT Goals (Current goals can be found in the Care Plan section)  Acute Rehab PT Goals Patient Stated Goal: return home PT Goal Formulation: With patient Time For Goal Achievement: 10/19/17 Potential to Achieve Goals: Good    Frequency Min 3X/week   Barriers to discharge        Co-evaluation               AM-PAC PT "6 Clicks" Daily Activity  Outcome Measure Difficulty turning over in bed (including adjusting bedclothes, sheets and blankets)?: None Difficulty moving from lying on back to sitting on the side of the bed? : A Little Difficulty sitting down on and standing up from a chair with arms (e.g., wheelchair, bedside commode, etc,.)?: A Little Help needed moving to and from a bed to chair (including a wheelchair)?: A Little Help needed walking in hospital room?: A Little Help needed climbing 3-5 steps with a  railing? : A Lot 6 Click Score: 18    End of Session   Activity Tolerance: Patient tolerated treatment well Patient left: in chair;with nursing/sitter in room(left sitting on commode, RN notified) Nurse Communication: Mobility status PT Visit Diagnosis: Unsteadiness on feet (R26.81);Other abnormalities of gait and mobility (R26.89);Muscle weakness (generalized) (M62.81)    Time: 3662-9476 PT Time Calculation (min) (ACUTE ONLY): 26 min   Charges:     PT Treatments $Therapeutic Activity: 23-37 mins   PT G Codes:        12:11 PM, 11-04-2017 Lonell Grandchild, MPT Physical Therapist with [facility] [phone] office [phone] mobile phone

## 2017-10-16 NOTE — H&P (Signed)
History and Physical    [name] [name] GBT:[id number] DOB: 15-Jun-1936 DOA: 10/15/2017  PCP: Rory Percy, MD   Patient coming from: Home.  I have personally briefly reviewed patient's old medical records in [place]  Chief Complaint: Altered mental status.  HPI: [name] [name] is a 82 y.o. female with medical history significant of chronic back pain, chronic diarrhea, type 2 diabetes, GERD, hyperlipidemia, hypertension who is brought to the emergency department due to altered mental status.    Apparently, the patient had a Reclast infusion earlier this week for osteoporosis treatment.  After that, the patient states that she developed upper back and bilateral shoulder pain.  She took some Ultram for pain and he seems she became altered.  She was seen at [facility] and discharged home, but AMS persisted, despite that she did not take any sedating medications during the day and she was brought to this facility yesterday evening.  She states that she has now had much to drink or eat in the past 2 days.  She denies headache, fever, chills, but feels fatigued.  No sore throat, dyspnea, chest pain, palpitations, dizziness or diaphoresis.  She gets occasional pitting edema of the lower extremities, but no PND or orthopnea.  She denies abdominal pain, nausea, emesis, diarrhea, constipation, melena or hematochezia.  She denies dysuria, frequency or hematuria.  No polyuria, polydipsia or blurred vision.  ED Course: Initial vital signs temperature 37.2C (99.0 F), pulse 91, respirations 17, blood pressure 181/75 and O2 sat 97% on room air.  Her workup shows a urinalysis with a hazy appearance, small hemoglobinuria, small proteinuria, negative nitrates but TNTC WBC with rare bacteria.  Urine toxicology was negative.  Lactic acid level x2 was normal at 1.1 mmol/L.  CBC shows a white count of 8.2 with a normal differential, hemoglobin of 11.1 g/dL and platelets 193.  Her CMP shows a sodium of 131,  potassium 3.3, chloride 93 and CO2 26 mmol/L.  Glucose was 205, BUN 18 creatinine 0.87 and calcium 8.6 mg/dL.  Her LFTs show a low albumin at 3.4 g/dL, but the rest of the hepatic function tests are within normal limits.  Troponin level was normal.  Imaging: A 2 view chest did not show any active cardiopulmonary disease.  CT head without contrast did not show any acute intracranial pathology.  Please see images and full radiology reports for further detail.  Medications in the ED The patient received ceftriaxone 1 g IVPB.  Review of Systems: As per HPI otherwise 10 point review of systems negative.    Past Medical History:  Diagnosis Date  . Chronic back pain   . Chronic diarrhea   . Diabetes ([place])   . GERD (gastroesophageal reflux disease)   . Hyperlipidemia   . Hypertension     Past Surgical History:  Procedure Laterality Date  . ABDOMINAL HYSTERECTOMY    . CARDIAC CATHETERIZATION    . CHOLECYSTECTOMY    . COLONOSCOPY  11/2015   Dr. Britta Mccreedy: hyperplastic polyp from sigmoid colon. biopsies from ascending colon were negative for microscopic colitis.   . TONSILLECTOMY    . TRIGGER FINGER RELEASE       reports that  has never smoked. she has never used smokeless tobacco. She reports that she does not drink alcohol or use drugs.  No Known Allergies  Family History  Problem Relation Age of Onset  . Heart attack Sister   . Heart attack Mother   .  Heart attack Father   . Colon cancer Neg Hx   . Celiac disease Neg Hx   . Inflammatory bowel disease Neg Hx     Prior to Admission medications   Medication Sig Start Date End Date Taking? Authorizing Provider  aspirin EC 81 MG tablet Take 81 mg by mouth daily.   Yes [provider]  atorvastatin (LIPITOR) 40 MG tablet Take 20 mg by mouth daily.    Yes [provider]  calcium-vitamin D (OSCAL WITH D) 500-200 MG-UNIT tablet Take 2 tablets by mouth daily.   Yes [provider]  Cyanocobalamin (B-12) 2500  MCG TABS Take 1 tablet by mouth daily.   Yes [provider]  DULoxetine (CYMBALTA) 60 MG capsule Take 60 mg by mouth daily.   Yes [provider]  enalapril (VASOTEC) 20 MG tablet Take 20 mg by mouth daily.   Yes [provider]  hydrochlorothiazide (HYDRODIURIL) 25 MG tablet Take 25 mg by mouth every morning.    Yes [provider]  insulin glargine (LANTUS) 100 UNIT/ML injection Inject 40 Units into the skin at bedtime.   Yes [provider]  labetalol (NORMODYNE) 200 MG tablet Take 200 mg by mouth 2 (two) times daily.   Yes [provider]  Lactobacillus Rhamnosus, GG, (RA PROBIOTIC DIGESTIVE CARE PO) Take 1 capsule by mouth daily.   Yes [provider]  Liraglutide (VICTOZA) 18 MG/3ML SOPN Inject 1.8 mg into the skin daily.   Yes [provider]  Multiple Vitamins-Minerals (CENTRUM) tablet Take 1 tablet by mouth daily.   Yes [provider]  ranitidine (ZANTAC) 150 MG tablet Take 150 mg by mouth 2 (two) times daily.   Yes [provider]  traMADol (ULTRAM) 50 MG tablet Take 50 mg by mouth at bedtime as needed for moderate pain or severe pain.   Yes [provider]    Physical Exam: Vitals:   10/16/17 0000 10/16/17 0030 10/16/17 0100 10/16/17 0130  BP: (!) 166/79 (!) 186/82 (!) 183/67 (!) 179/82  Pulse: 88 (!) 55 90 87  Resp: (!) 30 14 15 18   Temp:      TempSrc:      SpO2: 94% 93% 96% 92%  Weight:      Height:        Constitutional: NAD, calm, comfortable Eyes: PERRL, lids and conjunctivae normal ENMT: Mucous membranes and lips are dry. Posterior pharynx clear of any exudate or lesions. Neck: normal, supple, no masses, no thyromegaly Respiratory: clear to auscultation bilaterally, no wheezing, no crackles. Normal respiratory effort. No accessory muscle use.  Cardiovascular: Regular rate and rhythm, no murmurs / rubs / gallops. No extremity edema. 2+ pedal pulses. No carotid bruits.    Abdomen: Soft, no tenderness, no masses palpated. No hepatosplenomegaly. Bowel sounds positive.  Musculoskeletal: no clubbing / cyanosis. Good ROM, no contractures. Normal muscle tone.  Skin: no significant rashes, lesions, ulcers on limited dermatological exam. Neurologic: CN 2-12 grossly intact. Sensation intact, DTR normal. Strength 5/5 in all 4.  Psychiatric: Alert and oriented x 2, initially oriented to time and situation.  She knows the name of the president.  Labs on Admission: I have personally reviewed following labs and imaging studies  CBC: Recent Labs  Lab 10/15/17 2126  WBC 8.2  NEUTROABS 7.0  HGB 11.1*  HCT 34.1*  MCV 84.8  PLT 678   Basic Metabolic Panel: Recent Labs  Lab 10/15/17 2126  NA 131*  K 3.3*  CL 93*  CO2 26  GLUCOSE 205*  BUN 18  CREATININE 0.87  CALCIUM 8.6*   GFR: Estimated Creatinine Clearance: 49.3 mL/min (by C-G formula based on SCr of 0.87 mg/dL). Liver Function Tests: Recent Labs  Lab 10/15/17 2126  AST 24  ALT 24  ALKPHOS 90  BILITOT 0.7  PROT 6.5  ALBUMIN 3.4*   No results for input(s): LIPASE, AMYLASE in the last 168 hours. No results for input(s): AMMONIA in the last 168 hours. Coagulation Profile: No results for input(s): INR, PROTIME in the last 168 hours. Cardiac Enzymes: Recent Labs  Lab 10/15/17 2126  TROPONINI <0.03   BNP (last 3 results) No results for input(s): PROBNP in the last 8760 hours. HbA1C: No results for input(s): HGBA1C in the last 72 hours. CBG: No results for input(s): GLUCAP in the last 168 hours. Lipid Profile: No results for input(s): CHOL, HDL, LDLCALC, TRIG, CHOLHDL, LDLDIRECT in the last 72 hours. Thyroid Function Tests: No results for input(s): TSH, T4TOTAL, FREET4, T3FREE, THYROIDAB in the last 72 hours. Anemia Panel: No results for input(s): VITAMINB12, FOLATE, FERRITIN, TIBC, IRON, RETICCTPCT in the last 72 hours. Urine analysis:    Component Value Date/Time   COLORURINE YELLOW  10/15/2017 2127   APPEARANCEUR HAZY (A) 10/15/2017 2127   LABSPEC 1.014 10/15/2017 2127   PHURINE 5.0 10/15/2017 2127   GLUCOSEU NEGATIVE 10/15/2017 2127   HGBUR SMALL (A) 10/15/2017 2127   BILIRUBINUR NEGATIVE 10/15/2017 2127   KETONESUR NEGATIVE 10/15/2017 2127   PROTEINUR 100 (A) 10/15/2017 2127   NITRITE NEGATIVE 10/15/2017 2127   LEUKOCYTESUR MODERATE (A) 10/15/2017 2127    Radiological Exams on Admission: Dg Chest 2 View  Result Date: 10/15/2017 CLINICAL DATA:  [age]-year-old female with altered mental status. EXAM: CHEST  2 VIEW COMPARISON:  Chest radiograph dated 02/19/2016 FINDINGS: The lungs are clear. There is no pleural effusion or pneumothorax. The cardiac silhouette is within normal limits. There is osteopenia with degenerative changes of the spine and severe degenerative changes of the shoulders. Old healed right humeral neck fracture. No acute osseous pathology. IMPRESSION: No active cardiopulmonary disease. Electronically Signed   By: Anner Crete M.D.   On: 10/15/2017 22:42   Ct Head Wo Contrast  Result Date: 10/15/2017 CLINICAL DATA:  82 year old female with altered mental status. EXAM: CT HEAD WITHOUT CONTRAST TECHNIQUE: Contiguous axial images were obtained from the base of the skull through the vertex without intravenous contrast. COMPARISON:  None. FINDINGS: Brain: There is mild age-related atrophy and chronic microvascular ischemic changes. There is no acute intracranial hemorrhage. No mass effect or midline shift. No extra-axial fluid collection. Vascular: No hyperdense vessel or unexpected calcification. Skull: No acute calvarial pathology. Multiple scattered small lucent calvarial lesions are not well characterized and may be related to osteopenia. However, metastatic disease is not excluded. Clinical correlation is recommended. Sinuses/Orbits: Evidence of prior left lamina papyracea fracture. No acute fracture. Mild mucoperiosteal thickening of paranasal sinuses.  The mastoid air cells are clear. Other: None IMPRESSION: 1. No acute intracranial pathology. 2. Mild age-related atrophy and chronic microvascular ischemic changes. Electronically Signed   By: Anner Crete M.D.   On: 10/15/2017 22:37    EKG: Independently reviewed. Vent. rate 87 BPM PR interval * ms QRS duration 88 ms QT/QTc 387/466 ms P-R-T axes 64 -43 67 Sinus rhythm Atrial premature complexes Probable left atrial enlargement Abnormal R-wave progression, early transition Inferior infarct, old NO previous tracing to compare with. No acute electrocardiographic changes seen.  Assessment/Plan Principal Problem:   Altered  mental status Likely due to medication and may be UTI. Telemetry/observation Neuro checks every 4 hours. Continue IV hydration and pain control with non-sedating meds. Optimize electrolytes. Continue treatment for UTI. Consider further workup if no improvement.  Active Problems:   UTI (urinary tract infection)   Continue ceftriaxone 1 g IVPB every 24 hours. Follow-up urine culture and sensitivity.    Hypokalemia Replacing. Check magnesium level. Follow-up potassium level. The patient may benefit from a regular potassium supplement.     Hypomagnesemia Replacing with 4 g of magnesium sulfate. I will start regular supplementation with magnesium oxide 400 mg daily.    Essential hypertension Continue enalapril 20 mg p.o. daily. Continue hydrochlorothiazide 25 mg p.o. daily. Continue labetalol 200 mg p.o. twice daily.    Hyperlipidemia On atorvastatin 20 mg p.o. daily. Follow-up LFTs as needed. Fasting lipid profile to be followed as an outpatient.    GERD (gastroesophageal reflux disease) Continue Zantac 150 mg p.o. twice daily or formulary equivalent.     DVT prophylaxis: Lovenox SQ. Code Status: Full code. Family Communication:  Disposition Plan: Admit for IV hydration, electrolyte replacement and antibiotic therapy. Consults called:    Admission status: Observation/telemetry.   Reubin Milan MD [facility] Pager [phone].  If 7PM-7AM, please contact night-coverage [email or web address] Password [email or web address]  10/16/2017, 2:01 AM

## 2017-10-16 NOTE — Care Management Note (Signed)
Case Management Note  Patient Details  Name: [name] [name] MRN: [id number] Date of Birth: [birth year]  Subjective/Objective:         Admitted with AMS. Pt from home, lives with husband, very supportive family. Pt recently DC'd from OP PT. PT is recommending HH. PT/OT ordered. Pt hesitant but family insistent she needs it and she will agree when they come. Family requests [facility]. Aware they have 48 hrs to make first visit.             Action/Plan: DC home today with HH. Vaughan Basta, [facility] rep, aware of referral and will pull pt info from chart.   Expected Discharge Date:  10/16/17               Expected Discharge Plan:  [place]  In-House Referral:  NA  Discharge planning Services  CM Consult  Post Acute Care Choice:  Home Health Choice offered to:  Patient, Spouse, Adult Children  HH Arranged:  PT, OT [place] Agency:  [place]  Status of Service:  Completed, signed off  Sherald Barge, RN 10/16/2017, 2:56 PM

## 2017-10-16 NOTE — Progress Notes (Signed)
Pt found with blood coming from nose. Per patient and husband's report, patient has frequent nosebleeds at home.

## 2017-10-16 NOTE — ED Provider Notes (Signed)
[facility] EMERGENCY DEPARTMENT Provider Note   CSN: [id number] Arrival date & time: 10/15/17  2039     History   Chief Complaint Chief Complaint  Patient presents with  . Altered Mental Status    HPI [name] [name] is a 82 y.o. female.  HPI  82 year old female comes in with chief complaint of altered mental status.  Patient has history of diabetes and chronic back pain.  Patient takes tramadol as needed for back pain.  Family reports that patient started having some sluggishness about 2 days ago.  Patient was taken to [facility] at 3:30 in the morning because of confusion.  At [facility] patient had a urine analysis which was normal.  Family had informed the doctor over there that patient took 2 tramadol's prior to going to bed, and they suspected that her symptoms were due to tramadol overdose.  After being discharged, patient never improved.  In fact patient's mental status got worse, patient got somnolent, was unable to take care of herself, started urinating on herself, and stopped eating or drinking.  Patient's daughter went to check on her in the evening, and decided to call EMS.  EMS reported that patient had foul smelling urine.  Blood sugar was in the 200s.  Patient was started on IV fluids, and she became more alert.  Patient denies any nausea, vomiting, fevers, chills.  She does not entirely recall most of the day yesterday.  Patient has no focal numbness, weakness, vision changes, dizziness, chest pain.  Past Medical History:  Diagnosis Date  . Chronic back pain   . Chronic diarrhea   . Diabetes ([place])   . GERD (gastroesophageal reflux disease)   . Hyperlipidemia   . Hypertension     Patient Active Problem List   Diagnosis Date Noted  . Altered mental status 10/16/2017  . Chronic diarrhea 08/26/2017  . Essential hypertension 02/20/2016  . Dyspnea 02/19/2016    Past Surgical History:  Procedure Laterality Date  . ABDOMINAL HYSTERECTOMY    . CARDIAC  CATHETERIZATION    . CHOLECYSTECTOMY    . COLONOSCOPY  11/2015   Dr. Britta Mccreedy: hyperplastic polyp from sigmoid colon. biopsies from ascending colon were negative for microscopic colitis.   . TONSILLECTOMY    . TRIGGER FINGER RELEASE      OB History    No data available       Home Medications    Prior to Admission medications   Medication Sig Start Date End Date Taking? Authorizing Provider  aspirin EC 81 MG tablet Take 81 mg by mouth daily.   Yes [provider]  atorvastatin (LIPITOR) 40 MG tablet Take 20 mg by mouth daily.    Yes [provider]  calcium-vitamin D (OSCAL WITH D) 500-200 MG-UNIT tablet Take 2 tablets by mouth daily.   Yes [provider]  Cyanocobalamin (B-12) 2500 MCG TABS Take 1 tablet by mouth daily.   Yes [provider]  DULoxetine (CYMBALTA) 60 MG capsule Take 60 mg by mouth daily.   Yes [provider]  enalapril (VASOTEC) 20 MG tablet Take 20 mg by mouth daily.   Yes [provider]  hydrochlorothiazide (HYDRODIURIL) 25 MG tablet Take 25 mg by mouth every morning.    Yes [provider]  insulin glargine (LANTUS) 100 UNIT/ML injection Inject 40 Units into the skin at bedtime.   Yes [provider]  labetalol (NORMODYNE) 200 MG tablet Take 200 mg by mouth 2 (two) times daily.  Yes [provider]  Lactobacillus Rhamnosus, GG, (RA PROBIOTIC DIGESTIVE CARE PO) Take 1 capsule by mouth daily.   Yes [provider]  Liraglutide (VICTOZA) 18 MG/3ML SOPN Inject 1.8 mg into the skin daily.   Yes [provider]  Multiple Vitamins-Minerals (CENTRUM) tablet Take 1 tablet by mouth daily.   Yes [provider]  ranitidine (ZANTAC) 150 MG tablet Take 150 mg by mouth 2 (two) times daily.   Yes [provider]  traMADol (ULTRAM) 50 MG tablet Take 50 mg by mouth at bedtime as needed for moderate pain or severe pain.   Yes [provider]    Family  History Family History  Problem Relation Age of Onset  . Heart attack Sister   . Heart attack Mother   . Heart attack Father   . Colon cancer Neg Hx   . Celiac disease Neg Hx   . Inflammatory bowel disease Neg Hx     Social History Social History   Tobacco Use  . Smoking status: Never Smoker  . Smokeless tobacco: Never Used  Substance Use Topics  . Alcohol use: No    Alcohol/week: 0.0 oz  . Drug use: No     Allergies   Patient has no known allergies.   Review of Systems Review of Systems  Constitutional: Positive for activity change.  Gastrointestinal: Negative for nausea.  Allergic/Immunologic: Negative for immunocompromised state.  Neurological: Positive for weakness.  Psychiatric/Behavioral: Positive for confusion and hallucinations.     Physical Exam Updated Vital Signs BP (!) 193/68   Pulse 88   Temp 99 F (37.2 C) (Oral)   Resp (!) 26   Ht 5\' 3"  (1.6 m)   Wt 70.3 kg (155 lb)   SpO2 93%   BMI 27.46 kg/m   Physical Exam  Constitutional: She is oriented to person, place, and time. She appears well-developed.  HENT:  Head: Normocephalic and atraumatic.  Eyes: EOM are normal.  Neck: Normal range of motion. Neck supple.  Cardiovascular: Normal rate.  Pulmonary/Chest: Effort normal.  Abdominal: Bowel sounds are normal.  Neurological: She is alert and oriented to person, place, and time. No cranial nerve deficit. Coordination normal.  Cerebellar exam is normal (finger to nose) Sensory exam normal for bilateral upper and lower extremities - and patient is able to discriminate between sharp and dull. Motor exam is 4+/5   Skin: Skin is warm and dry.  Nursing note and vitals reviewed.    ED Treatments / Results  Labs (all labs ordered are listed, but only abnormal results are displayed) Labs Reviewed  URINALYSIS, ROUTINE W REFLEX MICROSCOPIC - Abnormal; Notable for the following components:      Result Value   APPearance HAZY (*)    Hgb urine  dipstick SMALL (*)    Protein, ur 100 (*)    Leukocytes, UA MODERATE (*)    Bacteria, UA RARE (*)    Squamous Epithelial / LPF 0-5 (*)    All other components within normal limits  COMPREHENSIVE METABOLIC PANEL - Abnormal; Notable for the following components:   Sodium 131 (*)    Potassium 3.3 (*)    Chloride 93 (*)    Glucose, Bld 205 (*)    Calcium 8.6 (*)    Albumin 3.4 (*)    All other components within normal limits  CBC WITH DIFFERENTIAL/PLATELET - Abnormal; Notable for the following components:   Hemoglobin 11.1 (*)    HCT 34.1 (*)    Lymphs Abs  0.6 (*)    All other components within normal limits  URINE CULTURE  RAPID URINE DRUG SCREEN, HOSP PERFORMED  TROPONIN I  LACTIC ACID, PLASMA  LACTIC ACID, PLASMA  ACETAMINOPHEN LEVEL  ETHANOL  SALICYLATE LEVEL    EKG  EKG Interpretation  Date/Time:  Thursday October 15 2017 21:32:36 EST Ventricular Rate:  87 PR Interval:    QRS Duration: 88 QT Interval:  387 QTC Calculation: 466 R Axis:   -43 Text Interpretation:  Sinus rhythm Atrial premature complexes Probable left atrial enlargement Abnormal R-wave progression, early transition Inferior infarct, old No acute changes Confirmed by Varney Biles [phone]) on 10/15/2017 9:35:51 PM       Radiology Dg Chest 2 View  Result Date: 10/15/2017 CLINICAL DATA:  [age]-year-old female with altered mental status. EXAM: CHEST  2 VIEW COMPARISON:  Chest radiograph dated 02/19/2016 FINDINGS: The lungs are clear. There is no pleural effusion or pneumothorax. The cardiac silhouette is within normal limits. There is osteopenia with degenerative changes of the spine and severe degenerative changes of the shoulders. Old healed right humeral neck fracture. No acute osseous pathology. IMPRESSION: No active cardiopulmonary disease. Electronically Signed   By: Anner Crete M.D.   On: 10/15/2017 22:42   Ct Head Wo Contrast  Result Date: 10/15/2017 CLINICAL DATA:  82 year old female with  altered mental status. EXAM: CT HEAD WITHOUT CONTRAST TECHNIQUE: Contiguous axial images were obtained from the base of the skull through the vertex without intravenous contrast. COMPARISON:  None. FINDINGS: Brain: There is mild age-related atrophy and chronic microvascular ischemic changes. There is no acute intracranial hemorrhage. No mass effect or midline shift. No extra-axial fluid collection. Vascular: No hyperdense vessel or unexpected calcification. Skull: No acute calvarial pathology. Multiple scattered small lucent calvarial lesions are not well characterized and may be related to osteopenia. However, metastatic disease is not excluded. Clinical correlation is recommended. Sinuses/Orbits: Evidence of prior left lamina papyracea fracture. No acute fracture. Mild mucoperiosteal thickening of paranasal sinuses. The mastoid air cells are clear. Other: None IMPRESSION: 1. No acute intracranial pathology. 2. Mild age-related atrophy and chronic microvascular ischemic changes. Electronically Signed   By: Anner Crete M.D.   On: 10/15/2017 22:37    Procedures Procedures (including critical care time)  Medications Ordered in ED Medications  cefTRIAXone (ROCEPHIN) 1 g in dextrose 5 % 50 mL IVPB (1 g Intravenous New Bag/Given 10/16/17 0104)     Initial Impression / Assessment and Plan / ED Course  I have reviewed the triage vital signs and the nursing notes.  Pertinent labs & imaging results that were available during my care of the patient were reviewed by me and considered in my medical decision making (see chart for details).  Clinical Course as of Oct 16 106  Fri Oct 16, 2017  0107 UA shows too many WBCs, and is leukocyte positive.  Patient has had UTI in the past with altered sensorium.  Although patient's mental status is much improved than what it was at home, she still sluggish compared to her usual self.  Patient is not septic.  Results from the ER workup discussed with the patient  and family.  Patient will get IV ceftriaxone while in the ED.  Family is not comfortable taking patient home, given that she lives with his elderly husband, and at home she was way more altered, and high fall risk.  They would prefer that patient be kept even as an observation status. Leukocytes, UA: (!) MODERATE [AN]  Clinical Course User Index [AN] Varney Biles, MD    82 year old comes in with chief complaint of altered mental status.  Patient has history of diabetes, and chronic back pain for which she takes tramadol.  Patient was seen yesterday at outside hospital, and had normal urine analysis.  It was suspected that patient's altered sensorium was due to tramadol overdose.  Based on my exam, patient has no focal neurologic deficit, and we do not suspect that patient had a stroke.  Differential diagnosis includes occult UTI, electrolyte abnormality, uremia.  Final Clinical Impressions(s) / ED Diagnoses   Final diagnoses:  Acute cystitis without hematuria  Disorientation    ED Discharge Orders    None       Varney Biles, MD 10/16/17 [phone]

## 2017-10-16 NOTE — Discharge Summary (Addendum)
Physician Discharge Summary  JAWANNA DYKMAN ERD:[id number] DOB: May 28, 1936 DOA: 10/15/2017  PCP: Rory Percy, MD  Admit date: 10/15/2017 Discharge date: 10/16/2017  Admitted From: Home Disposition:  Home  Recommendations for Outpatient Follow-up:  1. Follow up with PCP in 1-2 weeks 2. Patient being discharged on Keflex for 7 day course. Follow urine culture results as outpatient.   Home Health: PT Equipment/Devices: (Has walker and cane at home)  Discharge Condition: fair CODE STATUS: Full code Diet recommendation: Regular    Discharge Diagnoses:  Principal Problem:   Acute metabolic encephalopathy  Active Problems:   UTI (urinary tract infection)   Essential hypertension   Hyperlipidemia   GERD (gastroesophageal reflux disease)   Hypokalemia   Hypomagnesemia  Brief narrative status history of present illness Please refer to admission H&P for details, in brief, 82 y.o. female with medical history significant of chronic back pain, chronic diarrhea, type 2 diabetes, GERD, hyperlipidemia, hypertension presented with altered mental status. Apparently, the patient had a Reclast infusion earlier this week for osteoporosis treatment.  After that, the patient states that she developed upper back and bilateral shoulder pain.  She took some Ultram for pain and he seems she became altered.  She was seen at [facility] and discharged home, but AMS persisted, despite that she did not take any sedating medications during the day and she was brought to this facility yesterday evening.  She states that she has now had much to drink or eat in the past 2 days.  She denies headache, fever, chills, but feels fatigued.  No sore throat, dyspnea, chest pain, palpitations, dizziness or diaphoresis.  She gets occasional pitting edema of the lower extremities, but no PND or orthopnea.  She denies abdominal pain, nausea, emesis, diarrhea, constipation, melena or hematochezia.  She denies dysuria, frequency or  hematuria.  No polyuria, polydipsia or blurred vision.  ED Course:  vitals stable except for elevated blood pressure. UA positive for UTI. Blood work showed low hemoglobin 11 g/dL, hypokalemia and hypomagnesemia. Chest x-ray and head CT negative for acute findings. Shunt placed on observation.  Hospital course Acute metabolic encephalopathy Likely secondary to UTI and due to tramadol. Mental status appears to be returning to baseline on my exam today. (Patient does feel slightly groggy this morning but is well oriented) Given IV hydration, empiric IV Rocephin today urine culture sent on admission. Avoid narcotics. Stable to be discharged home with outpatient follow-up.  UTI On empiric Rocephin. Will discharge on oral Keflex for 7 day course. Follow urine cultures outpatient.    Hypokalemia/hypomagnesemia Replenished. Follow-up magnesium normal.   Essential hypertension Resume home blood pressure medications (enalapril, HCTZ and labetalol)  Hyperlipidemia  continue statin.   GERD Continue Zantac  Osteoarthritis Outpatient follow-up.   Generalized weakness   seen by PT and recommends home health with supervision. Instructed to use walker and cane during ablation at all times.   Frequent nosebleeds at home Will discontinue aspirin. Check H&H as outpatient.   Discharge Instructions   Allergies as of 10/16/2017   No Known Allergies     Medication List    STOP taking these medications   traMADol 50 MG tablet Commonly known as:  ULTRAM aspirin EC 81 MG tablet     TAKE these medications      atorvastatin 40 MG tablet Commonly known as:  LIPITOR Take 20 mg by mouth daily.   B-12 2500 MCG Tabs Take 1 tablet by mouth daily.   calcium-vitamin D 500-200 MG-UNIT tablet  Commonly known as:  OSCAL WITH D Take 2 tablets by mouth daily.   CENTRUM tablet Take 1 tablet by mouth daily.   cephALEXin 500 MG capsule Commonly known as:  KEFLEX Take 1 capsule (500 mg  total) by mouth 2 (two) times daily for 7 days.   DULoxetine 60 MG capsule Commonly known as:  CYMBALTA Take 60 mg by mouth daily.   enalapril 20 MG tablet Commonly known as:  VASOTEC Take 20 mg by mouth daily.   hydrochlorothiazide 25 MG tablet Commonly known as:  HYDRODIURIL Take 25 mg by mouth every morning.   insulin glargine 100 UNIT/ML injection Commonly known as:  LANTUS Inject 40 Units into the skin at bedtime.   labetalol 200 MG tablet Commonly known as:  NORMODYNE Take 200 mg by mouth 2 (two) times daily.   RA PROBIOTIC DIGESTIVE CARE PO Take 1 capsule by mouth daily.   ranitidine 150 MG tablet Commonly known as:  ZANTAC Take 150 mg by mouth 2 (two) times daily.   VICTOZA 18 MG/3ML Sopn Generic drug:  liraglutide Inject 1.8 mg into the skin daily.      Follow-up Information    Rory Percy, MD. Schedule an appointment as soon as possible for a visit in 1 week(s).   Specialty:  Family Medicine Contact information: [place] [address] [phone]          No Known Allergies      Procedures/Studies: Dg Chest 2 View  Result Date: 10/15/2017 CLINICAL DATA:  [age]-year-old female with altered mental status. EXAM: CHEST  2 VIEW COMPARISON:  Chest radiograph dated 02/19/2016 FINDINGS: The lungs are clear. There is no pleural effusion or pneumothorax. The cardiac silhouette is within normal limits. There is osteopenia with degenerative changes of the spine and severe degenerative changes of the shoulders. Old healed right humeral neck fracture. No acute osseous pathology. IMPRESSION: No active cardiopulmonary disease. Electronically Signed   By: Anner Crete M.D.   On: 10/15/2017 22:42   Ct Head Wo Contrast  Result Date: 10/15/2017 CLINICAL DATA:  82 year old female with altered mental status. EXAM: CT HEAD WITHOUT CONTRAST TECHNIQUE: Contiguous axial images were obtained from the base of the skull through the vertex without intravenous  contrast. COMPARISON:  None. FINDINGS: Brain: There is mild age-related atrophy and chronic microvascular ischemic changes. There is no acute intracranial hemorrhage. No mass effect or midline shift. No extra-axial fluid collection. Vascular: No hyperdense vessel or unexpected calcification. Skull: No acute calvarial pathology. Multiple scattered small lucent calvarial lesions are not well characterized and may be related to osteopenia. However, metastatic disease is not excluded. Clinical correlation is recommended. Sinuses/Orbits: Evidence of prior left lamina papyracea fracture. No acute fracture. Mild mucoperiosteal thickening of paranasal sinuses. The mastoid air cells are clear. Other: None IMPRESSION: 1. No acute intracranial pathology. 2. Mild age-related atrophy and chronic microvascular ischemic changes. Electronically Signed   By: Anner Crete M.D.   On: 10/15/2017 22:37       Subjective: Reports feeling better but slightly groggy. Also complained of episodic nosebleeds at home.  Discharge Exam: Vitals:   10/16/17 1250 10/16/17 1300  BP:    Pulse:  85  Resp:  (!) 21  Temp: 99 F (37.2 C)   SpO2:  96%   Vitals:   10/16/17 1100 10/16/17 1200 10/16/17 1250 10/16/17 1300  BP:  (!) 145/92    Pulse: 81 83  85  Resp:  (!) 21  (!) 21  Temp:   99 F (37.2 C)   TempSrc:   Oral   SpO2: 95% 96%  96%  Weight:      Height:        General: Elderly female not in distress, appears fatigued HEENT: Moist mucosa, supple neck Chest: Clear bilaterally CVS: Normal S1 and S2, no murmurs GI: Soft, nondistended, nontender, bowel sounds present [place] to: Warm, no edema CNS: Alert and oriented, nonfocal    The results of significant diagnostics from this hospitalization (including imaging, microbiology, ancillary and laboratory) are listed below for reference.     Microbiology: Recent Results (from the past 240 hour(s))  MRSA PCR Screening     Status: None   Collection Time:  10/16/17  1:59 AM  Result Value Ref Range Status   MRSA by PCR NEGATIVE NEGATIVE Final    Comment:        The GeneXpert MRSA Assay (FDA approved for NASAL specimens only), is one component of a comprehensive MRSA colonization surveillance program. It is not intended to diagnose MRSA infection nor to guide or monitor treatment for MRSA infections.      Labs: BNP (last 3 results) No results for input(s): BNP in the last 8760 hours. Basic Metabolic Panel: Recent Labs  Lab 10/15/17 2126 10/15/17 2127 10/16/17 0730  NA 131*  --  131*  K 3.3*  --  3.3*  CL 93*  --  97*  CO2 26  --  24  GLUCOSE 205*  --  136*  BUN 18  --  18  CREATININE 0.87  --  0.90  CALCIUM 8.6*  --  7.8*  MG  --  1.1* 2.1  PHOS  --  2.9  --    Liver Function Tests: Recent Labs  Lab 10/15/17 2126  AST 24  ALT 24  ALKPHOS 90  BILITOT 0.7  PROT 6.5  ALBUMIN 3.4*   No results for input(s): LIPASE, AMYLASE in the last 168 hours. No results for input(s): AMMONIA in the last 168 hours. CBC: Recent Labs  Lab 10/15/17 2126 10/16/17 0730  WBC 8.2 5.0  NEUTROABS 7.0 3.6  HGB 11.1* 9.8*  HCT 34.1* 30.4*  MCV 84.8 85.4  PLT 193 171   Cardiac Enzymes: Recent Labs  Lab 10/15/17 2126  TROPONINI <0.03   BNP: Invalid input(s): POCBNP CBG: Recent Labs  Lab 10/16/17 0745 10/16/17 1148  GLUCAP 134* 164*   D-Dimer No results for input(s): DDIMER in the last 72 hours. Hgb A1c No results for input(s): HGBA1C in the last 72 hours. Lipid Profile No results for input(s): CHOL, HDL, LDLCALC, TRIG, CHOLHDL, LDLDIRECT in the last 72 hours. Thyroid function studies No results for input(s): TSH, T4TOTAL, T3FREE, THYROIDAB in the last 72 hours.  Invalid input(s): FREET3 Anemia work up No results for input(s): VITAMINB12, FOLATE, FERRITIN, TIBC, IRON, RETICCTPCT in the last 72 hours. Urinalysis    Component Value Date/Time   COLORURINE YELLOW 10/15/2017 2127   APPEARANCEUR HAZY (A) 10/15/2017  2127   LABSPEC 1.014 10/15/2017 2127   PHURINE 5.0 10/15/2017 2127   GLUCOSEU NEGATIVE 10/15/2017 2127   HGBUR SMALL (A) 10/15/2017 2127   BILIRUBINUR NEGATIVE 10/15/2017 2127   [place] NEGATIVE 10/15/2017 2127   PROTEINUR 100 (A) 10/15/2017 2127   NITRITE NEGATIVE 10/15/2017 2127   LEUKOCYTESUR MODERATE (A) 10/15/2017 2127   Sepsis Labs Invalid input(s): PROCALCITONIN,  WBC,  LACTICIDVEN Microbiology Recent Results (from the past 240 hour(s))  MRSA PCR Screening     Status: None  Collection Time: 10/16/17  1:59 AM  Result Value Ref Range Status   MRSA by PCR NEGATIVE NEGATIVE Final    Comment:        The GeneXpert MRSA Assay (FDA approved for NASAL specimens only), is one component of a comprehensive MRSA colonization surveillance program. It is not intended to diagnose MRSA infection nor to guide or monitor treatment for MRSA infections.      Time coordinating discharge: <30 minutes  SIGNED:   Louellen Molder, MD  [facility] 10/16/2017, 2:11 PM Pager   If 7PM-7AM, please contact night-coverage [email or web address] Password [email or web address]

## 2017-10-16 NOTE — Progress Notes (Signed)
Inpatient Diabetes Program Recommendations  AACE/ADA: New Consensus Statement on Inpatient Glycemic Control (2015)  Target Ranges:  Prepandial:   less than 140 mg/dL      Peak postprandial:   less than 180 mg/dL (1-2 hours)      Critically ill patients:  140 - 180 mg/dL   Results for [name] [name], [name] [name] (MRN [id number]) as of 10/16/2017 09:20  Ref. Range 10/15/2017 21:26 10/16/2017 07:30  Glucose Latest Ref Range: 65 - 99 mg/dL 205 (H) 136 (H)   Review of Glycemic Control  Diabetes history: DM2 Outpatient Diabetes medications: Victoza 1.8 mg daily, Lantus 40 units QHS Current orders for Inpatient glycemic control: Lantus 40 units QHS, Novolog 0-9 units TID with meals, Victoza 1.8 mg daily  Inpatient Diabetes Program Recommendations: HgbA1C: Please consider ordering an A1C to evaluate glycemic control over the past 2-3 months.  NOTE: Spoke with patient over phone and she reports that she consistently takes Lantus and Victoza as prescribed. She denies missing any Lantus doses or adjusting the dose at all. Patient reports that she checks her glucose once a day and it is usually in the low to mid 100's mg/dl every morning.   Thanks, Barnie Alderman, RN, MSN, CDE Diabetes Coordinator Inpatient Diabetes Program [phone] (Team Pager from 8am to 5pm)

## 2017-10-18 LAB — URINE CULTURE: Culture: >=100000 — AB

## 2017-12-03 LAB — CBC WITH DIFFERENTIAL/PLATELET
BASOS ABS: 51 {cells}/uL (ref 0–200)
BASOS PCT: 0.7 %
EOS ABS: 190 {cells}/uL (ref 15–500)
Eosinophils Relative: 2.6 %
HCT: 31 % — ABNORMAL LOW (ref 35.0–45.0)
Hemoglobin: 10.3 g/dL — ABNORMAL LOW (ref 11.7–15.5)
Lymphs Abs: 1584 cells/uL (ref 850–3900)
MCH: 27.5 pg (ref 27.0–33.0)
MCHC: 33.2 g/dL (ref 32.0–36.0)
MCV: 82.7 fL (ref 80.0–100.0)
MONOS PCT: 9.5 %
MPV: 12.2 fL (ref 7.5–12.5)
NEUTROS PCT: 65.5 %
Neutro Abs: 4782 cells/uL (ref 1500–7800)
Platelets: 221 10*3/uL (ref 140–400)
RBC: 3.75 10*6/uL — ABNORMAL LOW (ref 3.80–5.10)
RDW: 13 % (ref 11.0–15.0)
Total Lymphocyte: 21.7 %
WBC: 7.3 10*3/uL (ref 3.8–10.8)
WBCMIX: 694 {cells}/uL (ref 200–950)

## 2017-12-03 LAB — GASTROINTESTINAL PATHOGEN PANEL PCR
C. DIFFICILE TOX A/B, PCR: NOT DETECTED
CRYPTOSPORIDIUM, PCR: NOT DETECTED
Campylobacter, PCR: NOT DETECTED
E COLI (STEC) STX1/STX2, PCR: NOT DETECTED
E COLI 0157, PCR: NOT DETECTED
E coli (ETEC) LT/ST PCR: NOT DETECTED
Giardia lamblia, PCR: NOT DETECTED
Norovirus, PCR: NOT DETECTED
Rotavirus A, PCR: NOT DETECTED
Salmonella, PCR: NOT DETECTED
Shigella, PCR: NOT DETECTED

## 2017-12-03 LAB — COMPREHENSIVE METABOLIC PANEL
AG Ratio: 1.6 (calc) (ref 1.0–2.5)
ALKALINE PHOSPHATASE (APISO): 77 U/L (ref 33–130)
ALT: 15 U/L (ref 6–29)
AST: 15 U/L (ref 10–35)
Albumin: 3.9 g/dL (ref 3.6–5.1)
BUN / CREAT RATIO: 22 (calc) (ref 6–22)
BUN: 24 mg/dL (ref 7–25)
CHLORIDE: 103 mmol/L (ref 98–110)
CO2: 24 mmol/L (ref 20–32)
CREATININE: 1.07 mg/dL — AB (ref 0.60–0.93)
Calcium: 9.2 mg/dL (ref 8.6–10.4)
GLOBULIN: 2.4 g/dL (ref 1.9–3.7)
GLUCOSE: 269 mg/dL — AB (ref 65–139)
Potassium: 4.2 mmol/L (ref 3.5–5.3)
Sodium: 136 mmol/L (ref 135–146)
Total Bilirubin: 0.3 mg/dL (ref 0.2–1.2)
Total Protein: 6.3 g/dL (ref 6.1–8.1)

## 2017-12-03 LAB — TISSUE TRANSGLUTAMINASE, IGA: (tTG) Ab, IgA: 1 U/mL

## 2017-12-03 LAB — TSH: TSH: 2.36 mIU/L (ref 0.40–4.50)

## 2017-12-03 LAB — IGA: Immunoglobulin A: 170 mg/dL (ref 81–463)

## 2017-12-08 DIAGNOSIS — M4316 Spondylolisthesis, lumbar region: Secondary | ICD-10-CM | POA: Insufficient documentation

## 2017-12-10 NOTE — Telephone Encounter (Signed)
Please let patient know her celiac screen was neg, thyroid function normal, stool test negative for infection.  She has mild anemia.   Unfortunately it has been over 3 months since I had ordered these tests and saw the patient. I'm sure part of delay was secondary to hospitalization in 09/2017.  At this point, she needs follow up ov with preferably RMR only within next 3 weeks. She may need repeat colonoscopy for chronic diarrhea and new anemia.

## 2017-12-13 NOTE — Telephone Encounter (Signed)
I would offer her a sooner appointment with LSL, EG, AB or if she is ok with waiting to see RMR that is fine too. Give the patient the choice. Thanks!

## 2018-01-12 ENCOUNTER — Ambulatory Visit: Payer: Medicare Other | Admitting: Gastroenterology

## 2018-01-12 ENCOUNTER — Encounter: Payer: Self-pay | Admitting: Gastroenterology

## 2018-01-12 VITALS — BP 151/82 | HR 81 | Temp 97.2°F | Ht 62.5 in | Wt 152.2 lb

## 2018-01-12 DIAGNOSIS — D649 Anemia, unspecified: Secondary | ICD-10-CM | POA: Insufficient documentation

## 2018-01-12 DIAGNOSIS — K529 Noninfective gastroenteritis and colitis, unspecified: Secondary | ICD-10-CM | POA: Diagnosis not present

## 2018-01-12 NOTE — Progress Notes (Signed)
Primary Care Physician: Rory Percy, MD  Primary Gastroenterologist:  Garfield Cornea, MD   Chief Complaint  Patient presents with  . Diarrhea    f/u. Still having issues but not daily.    HPI: [name] [name] is a 82 y.o. female here for follow-up.  She was seen first back in November 2018 for chronic diarrhea.  Symptoms occurring for more than 8 years.  Previously evaluated by Dr. Britta Mccreedy in 2017 and had a colonoscopy.  She had a hyperplastic polyp removed from the sigmoid colon.  Biopsies taken from the ascending colon, negative for microscopic colitis.  In March her celiac serologies were negative, thyroid function normal, stool test negative for infection.  Found to have mild anemia with hemoglobin of 10.3, hematocrit 31.  Her workup was delayed by hospitalization back in January for altered mental status change, UTI.  She continues to have episodes of diarrhea about twice per week. States on those days she will start out with [place] 2 and go to [place] 6-7. She has at least 5 BMs during those days and takes 2 imodium after the first loose stool and usually a third one to get the diarrhea to stop. Then she may go several days with NO BM. No melena, brbpr. Some abd cramping prior to BMs. Appetite is good. Heartburn well controlled. She states she is chronically anemic. I don't have baseline available prior to 09/2017.    Current Outpatient Medications  Medication Sig Dispense Refill  . aspirin EC 81 MG tablet Take 81 mg by mouth daily.    Marland Kitchen atorvastatin (LIPITOR) 40 MG tablet Take 20 mg by mouth daily.     . calcium-vitamin D (OSCAL WITH D) 500-200 MG-UNIT tablet Take 2 tablets by mouth daily.    . Cyanocobalamin (B-12) 2500 MCG TABS Take 1 tablet by mouth daily.    . DULoxetine (CYMBALTA) 60 MG capsule Take 60 mg by mouth daily.    . enalapril (VASOTEC) 20 MG tablet Take 20 mg by mouth daily.    . hydrochlorothiazide (HYDRODIURIL) 25 MG tablet Take 25 mg by mouth every morning.       . insulin glargine (LANTUS) 100 UNIT/ML injection Inject 50 Units into the skin at bedtime.     Marland Kitchen labetalol (NORMODYNE) 200 MG tablet Take 200 mg by mouth 2 (two) times daily.    . Lactobacillus Rhamnosus, GG, (RA PROBIOTIC DIGESTIVE CARE PO) Take 1 capsule by mouth daily.    . Liraglutide (VICTOZA) 18 MG/3ML SOPN Inject 1.8 mg into the skin daily.    Marland Kitchen MAGNESIUM PO Take by mouth daily.    . Multiple Vitamins-Minerals (CENTRUM) tablet Take 1 tablet by mouth daily.    . ranitidine (ZANTAC) 150 MG tablet Take 150 mg by mouth 2 (two) times daily.     No current facility-administered medications for this visit.     Allergies as of 01/12/2018  . (No Known Allergies)   Past Medical History:  Diagnosis Date  . Chronic back pain   . Chronic diarrhea   . Diabetes ([place])   . GERD (gastroesophageal reflux disease)   . Hyperlipidemia   . Hypertension    Past Surgical History:  Procedure Laterality Date  . ABDOMINAL HYSTERECTOMY    . CARDIAC CATHETERIZATION    . CHOLECYSTECTOMY    . COLONOSCOPY  11/2015   Dr. Britta Mccreedy: hyperplastic polyp from sigmoid colon. biopsies from ascending colon were negative for microscopic colitis.   . TONSILLECTOMY    .  TRIGGER FINGER RELEASE     Family History  Problem Relation Age of Onset  . Heart attack Sister   . Heart attack Mother   . Heart attack Father   . Colon cancer Neg Hx   . Celiac disease Neg Hx   . Inflammatory bowel disease Neg Hx    Social History   Tobacco Use  . Smoking status: Never Smoker  . Smokeless tobacco: Never Used  Substance Use Topics  . Alcohol use: No    Alcohol/week: 0.0 oz  . Drug use: No    ROS:  General: Negative for anorexia, weight loss, fever, chills, fatigue, weakness. ENT: Negative for hoarseness, difficulty swallowing , nasal congestion. CV: Negative for chest pain, angina, palpitations, dyspnea on exertion, peripheral edema.  Respiratory: Negative for dyspnea at rest, dyspnea on exertion, cough, sputum,  wheezing.  GI: See history of present illness. GU:  Negative for dysuria, hematuria, urinary incontinence, urinary frequency, nocturnal urination.  Endo: Negative for unusual weight change.    Physical Examination:   BP (!) 151/82   Pulse 81   Temp (!) 97.2 F (36.2 C) (Oral)   Ht 5' 2.5" (1.588 m)   Wt 152 lb 3.2 oz (69 kg)   BMI 27.39 kg/m   General: Well-nourished, well-developed in no acute distress.  Eyes: No icterus. Mouth: Oropharyngeal mucosa moist and pink , no lesions erythema or exudate. Lungs: Clear to auscultation bilaterally.  Heart: Regular rate and rhythm, no murmurs rubs or gallops.  Abdomen: Bowel sounds are normal, nontender, nondistended, no hepatosplenomegaly or masses, no abdominal bruits or hernia , no rebound or guarding.   Extremities: No lower extremity edema. No clubbing or deformities. Neuro: Alert and oriented x 4   Skin: Warm and dry, no jaundice.   Psych: Alert and cooperative, normal mood and affect.  Labs:  Lab Results  Component Value Date   CREATININE 1.07 (H) 12/02/2017   BUN 24 12/02/2017   NA 136 12/02/2017   K 4.2 12/02/2017   CL 103 12/02/2017   CO2 24 12/02/2017   Lab Results  Component Value Date   WBC 7.3 12/02/2017   HGB 10.3 (L) 12/02/2017   HCT 31.0 (L) 12/02/2017   MCV 82.7 12/02/2017   PLT 221 12/02/2017   Lab Results  Component Value Date   CREATININE 1.07 (H) 12/02/2017   BUN 24 12/02/2017   NA 136 12/02/2017   K 4.2 12/02/2017   CL 103 12/02/2017   CO2 24 12/02/2017    Imaging Studies: No results found.

## 2018-01-12 NOTE — Assessment & Plan Note (Signed)
Chronic diarrhea as previously outlined.  Ascending colon biopsies negative for microscopic colitis in 2017, performed by Dr. Britta Mccreedy.  Labs negative for celiac disease, thyroid function normal.  Stool studies negative for infection.  We discussed limiting her Imodium somewhat and approach management of her diarrhea on a daily basis.  She will take Imodium half to 1 tablet every day, hold for constipation.  Hopefully she will not require days of taking 2 and 3 tablets which is likely contributing to her lack of bowel movement for several days afterwards.  She may be in a vicious cycle, discussed at length with her and her husband today.  She will keep a stool diary which we will go over the phone in the upcoming weeks.  We will touch base with her after labs available.

## 2018-01-12 NOTE — Assessment & Plan Note (Addendum)
Reports chronic anemia, back in March her hemoglobin was 10.3, hematocrit 31, MCV normal.  Iron studies not performed.  She states she has been on iron in the past which has helped her anemia.  At this point would like to check iron indices.  Check iFOBT.  If there is any evidence of iron deficiency anemia or blood in the stool, she will require further work-up.  Further recommendations to follow.

## 2018-01-12 NOTE — Patient Instructions (Addendum)
1. Take 1/2 to 1 Imodium every morning to see if we can regulate your bowels better. Hold a day or two if you develop constipation.  2. Keep a stool diary, listing how many stools you have each day and if they are hard/soft/liquid.  3. Please collect stool to check for blood. 4. Please have your blood work done. We will touch base after results are available.

## 2018-01-12 NOTE — Progress Notes (Signed)
cc'ed to pcp °

## 2018-01-13 ENCOUNTER — Other Ambulatory Visit: Payer: Self-pay

## 2018-01-13 ENCOUNTER — Ambulatory Visit (INDEPENDENT_AMBULATORY_CARE_PROVIDER_SITE_OTHER): Payer: Self-pay | Admitting: Gastroenterology

## 2018-01-13 ENCOUNTER — Telehealth: Payer: Self-pay

## 2018-01-13 DIAGNOSIS — K529 Noninfective gastroenteritis and colitis, unspecified: Secondary | ICD-10-CM

## 2018-01-13 DIAGNOSIS — R197 Diarrhea, unspecified: Secondary | ICD-10-CM

## 2018-01-13 LAB — CBC WITH DIFFERENTIAL/PLATELET
Basophils Absolute: 68 cells/uL (ref 0–200)
Basophils Relative: 0.9 %
Eosinophils Absolute: 327 cells/uL (ref 15–500)
Eosinophils Relative: 4.3 %
HCT: 32.2 % — ABNORMAL LOW (ref 35.0–45.0)
Hemoglobin: 10.9 g/dL — ABNORMAL LOW (ref 11.7–15.5)
Lymphs Abs: 1885 cells/uL (ref 850–3900)
MCH: 28.2 pg (ref 27.0–33.0)
MCHC: 33.9 g/dL (ref 32.0–36.0)
MCV: 83.2 fL (ref 80.0–100.0)
MONOS PCT: 10.4 %
MPV: 11.6 fL (ref 7.5–12.5)
NEUTROS PCT: 59.6 %
Neutro Abs: 4530 cells/uL (ref 1500–7800)
PLATELETS: 223 10*3/uL (ref 140–400)
RBC: 3.87 10*6/uL (ref 3.80–5.10)
RDW: 14.1 % (ref 11.0–15.0)
TOTAL LYMPHOCYTE: 24.8 %
WBC: 7.6 10*3/uL (ref 3.8–10.8)
WBCMIX: 790 {cells}/uL (ref 200–950)

## 2018-01-13 LAB — IRON,TIBC AND FERRITIN PANEL
%SAT: 20 % (ref 11–50)
Ferritin: 50 ng/mL (ref 20–288)
Iron: 64 ug/dL (ref 45–160)
TIBC: 327 mcg/dL (calc) (ref 250–450)

## 2018-01-13 NOTE — Telephone Encounter (Signed)
IOBT put in today. Results were Positive.

## 2018-01-13 NOTE — Telephone Encounter (Signed)
Result did not show up under lab. Please make corrections.

## 2018-01-13 NOTE — Telephone Encounter (Signed)
Result note to follow.

## 2018-01-14 NOTE — Telephone Encounter (Signed)
Results should be listed now.

## 2018-01-21 ENCOUNTER — Telehealth: Payer: Self-pay | Admitting: Internal Medicine

## 2018-01-21 NOTE — Telephone Encounter (Signed)
Pt is inquiring about her ifobt results. Results were positive.

## 2018-01-21 NOTE — Telephone Encounter (Signed)
Pt was calling to see if her results from her stool sample was available. Please call her [phone]

## 2018-01-22 NOTE — Progress Notes (Signed)
Please let patient know that her Hgb remains slightly low but stable. Iron is normal.  She is heme positive.   How is she doing with daily imodium? She was supposed to be keep stool diary, maybe she can mail that to [place]. I am waiting to hear back from RMR about possibly offering patient a colonoscopy for heme positive stool.

## 2018-01-22 NOTE — Telephone Encounter (Signed)
See lab result note.

## 2018-01-22 NOTE — Telephone Encounter (Signed)
Noted, documentation will be in result note.  

## 2018-01-22 NOTE — Telephone Encounter (Signed)
Lmom, waiting on a return call.  

## 2018-01-29 ENCOUNTER — Ambulatory Visit: Payer: Self-pay | Admitting: Internal Medicine

## 2018-02-04 ENCOUNTER — Other Ambulatory Visit: Payer: Self-pay

## 2018-02-04 DIAGNOSIS — K529 Noninfective gastroenteritis and colitis, unspecified: Secondary | ICD-10-CM

## 2018-02-04 DIAGNOSIS — R195 Other fecal abnormalities: Secondary | ICD-10-CM

## 2018-02-04 DIAGNOSIS — D649 Anemia, unspecified: Secondary | ICD-10-CM

## 2018-02-04 MED ORDER — PEG 3350-KCL-NA BICARB-NACL 420 G PO SOLR: 4000.0000 mL | ORAL | 0 refills | Status: DC

## 2018-03-29 ENCOUNTER — Telehealth: Payer: Self-pay

## 2018-03-29 NOTE — Telephone Encounter (Signed)
Pt called office and LMOVM that she is diabetic and scheduled for TCS 03/31/18. Her DM meds were adjusted on her TCS instructions. Tried to call pt back, no answer, LMOAM and informed her.

## 2018-03-31 ENCOUNTER — Other Ambulatory Visit: Payer: Self-pay

## 2018-03-31 ENCOUNTER — Encounter (HOSPITAL_COMMUNITY)
Admission: RE | Disposition: A | Discharge status: Home / Self Care | Payer: Self-pay | Source: Ambulatory Visit | Attending: Internal Medicine

## 2018-03-31 ENCOUNTER — Ambulatory Visit (HOSPITAL_COMMUNITY)
Admission: RE | Admit: 2018-03-31 | Discharge: 2018-03-31 | Disposition: A | Discharge status: Home / Self Care | Payer: Medicare Other | Source: Ambulatory Visit | Attending: Internal Medicine | Admitting: Internal Medicine

## 2018-03-31 ENCOUNTER — Encounter (HOSPITAL_COMMUNITY): Payer: Self-pay | Admitting: *Deleted

## 2018-03-31 DIAGNOSIS — Z79899 Other long term (current) drug therapy: Secondary | ICD-10-CM | POA: Diagnosis not present

## 2018-03-31 DIAGNOSIS — K529 Noninfective gastroenteritis and colitis, unspecified: Secondary | ICD-10-CM | POA: Insufficient documentation

## 2018-03-31 DIAGNOSIS — K640 First degree hemorrhoids: Secondary | ICD-10-CM | POA: Diagnosis not present

## 2018-03-31 DIAGNOSIS — K219 Gastro-esophageal reflux disease without esophagitis: Secondary | ICD-10-CM | POA: Diagnosis not present

## 2018-03-31 DIAGNOSIS — E785 Hyperlipidemia, unspecified: Secondary | ICD-10-CM | POA: Diagnosis not present

## 2018-03-31 DIAGNOSIS — Z8249 Family history of ischemic heart disease and other diseases of the circulatory system: Secondary | ICD-10-CM | POA: Diagnosis not present

## 2018-03-31 DIAGNOSIS — K5730 Diverticulosis of large intestine without perforation or abscess without bleeding: Secondary | ICD-10-CM | POA: Insufficient documentation

## 2018-03-31 DIAGNOSIS — Z794 Long term (current) use of insulin: Secondary | ICD-10-CM | POA: Insufficient documentation

## 2018-03-31 DIAGNOSIS — Z7982 Long term (current) use of aspirin: Secondary | ICD-10-CM | POA: Insufficient documentation

## 2018-03-31 DIAGNOSIS — Z86010 Personal history of colonic polyps: Secondary | ICD-10-CM | POA: Diagnosis not present

## 2018-03-31 DIAGNOSIS — R195 Other fecal abnormalities: Secondary | ICD-10-CM

## 2018-03-31 DIAGNOSIS — D649 Anemia, unspecified: Secondary | ICD-10-CM

## 2018-03-31 DIAGNOSIS — I10 Essential (primary) hypertension: Secondary | ICD-10-CM | POA: Diagnosis not present

## 2018-03-31 DIAGNOSIS — E119 Type 2 diabetes mellitus without complications: Secondary | ICD-10-CM | POA: Insufficient documentation

## 2018-03-31 HISTORY — PX: COLONOSCOPY: SHX5424

## 2018-03-31 LAB — GLUCOSE, CAPILLARY: Glucose-Capillary: 114 mg/dL — ABNORMAL HIGH (ref 70–99)

## 2018-03-31 SURGERY — COLONOSCOPY
Anesthesia: Moderate Sedation

## 2018-03-31 MED ORDER — ONDANSETRON HCL 4 MG/2ML IJ SOLN
INTRAMUSCULAR | Status: AC
  Filled 2018-03-31: qty 2

## 2018-03-31 MED ORDER — SODIUM CHLORIDE 0.9 % IV SOLN
INTRAVENOUS | Status: DC
  Administered 2018-03-31: 1000 mL via INTRAVENOUS

## 2018-03-31 MED ORDER — STERILE WATER FOR IRRIGATION IR SOLN
Status: DC | PRN
  Administered 2018-03-31: 2.5 mL

## 2018-03-31 MED ORDER — MIDAZOLAM HCL 5 MG/5ML IJ SOLN
INTRAMUSCULAR | Status: DC | PRN
  Administered 2018-03-31: 1 mg via INTRAVENOUS
  Administered 2018-03-31: 2 mg via INTRAVENOUS

## 2018-03-31 MED ORDER — MEPERIDINE HCL 100 MG/ML IJ SOLN
INTRAMUSCULAR | Status: DC | PRN
  Administered 2018-03-31 (×2): 25 mg via INTRAVENOUS

## 2018-03-31 MED ORDER — MIDAZOLAM HCL 5 MG/5ML IJ SOLN
INTRAMUSCULAR | Status: AC
  Filled 2018-03-31: qty 10

## 2018-03-31 MED ORDER — MEPERIDINE HCL 100 MG/ML IJ SOLN
INTRAMUSCULAR | Status: AC
  Filled 2018-03-31: qty 2

## 2018-03-31 MED ORDER — ONDANSETRON HCL 4 MG/2ML IJ SOLN
INTRAMUSCULAR | Status: DC | PRN
  Administered 2018-03-31: 4 mg via INTRAVENOUS

## 2018-03-31 NOTE — Discharge Instructions (Signed)

## 2018-03-31 NOTE — H&P (Signed)
@LOGO @   Primary Care Physician:  Rory Percy, MD Primary Gastroenterologist:  Dr. Gala Romney  Pre-Procedure History & Physical: HPI:  [name] [name] is a 82 y.o. female here for further evaluation of chronic diarrhea and Hemoccult positive stool. Colonoscopy.  Past Medical History:  Diagnosis Date  . Chronic back pain   . Chronic diarrhea   . Diabetes ([place])   . GERD (gastroesophageal reflux disease)   . Hyperlipidemia   . Hypertension     Past Surgical History:  Procedure Laterality Date  . ABDOMINAL HYSTERECTOMY    . CARDIAC CATHETERIZATION    . CHOLECYSTECTOMY    . COLONOSCOPY  11/2015   Dr. Britta Mccreedy: hyperplastic polyp from sigmoid colon. biopsies from ascending colon were negative for microscopic colitis.   . TONSILLECTOMY    . TRIGGER FINGER RELEASE      Prior to Admission medications   Medication Sig Start Date End Date Taking? Authorizing Provider  aspirin EC 81 MG tablet Take 81 mg by mouth daily.   Yes [provider]  atorvastatin (LIPITOR) 40 MG tablet Take 20 mg by mouth daily.    Yes [provider]  Calcium Citrate-Vitamin D (CALCIUM CITRATE+D3 PETITES) 200-250 MG-UNIT TABS Take 1 tablet by mouth daily.   Yes [provider]  Cyanocobalamin (B-12) 1000 MCG TABS Take 1,000 mcg by mouth daily.    Yes [provider]  diclofenac sodium (VOLTAREN) 1 % GEL Apply 2 g topically at bedtime.   Yes [provider]  DULoxetine (CYMBALTA) 60 MG capsule Take 60 mg by mouth daily.   Yes [provider]  enalapril (VASOTEC) 20 MG tablet Take 20 mg by mouth daily.   Yes [provider]  hydrochlorothiazide (HYDRODIURIL) 25 MG tablet Take 25 mg by mouth every morning.    Yes [provider]  insulin glargine (LANTUS) 100 UNIT/ML injection Inject 40 Units into the skin at bedtime.    Yes [provider]  labetalol (NORMODYNE) 200 MG tablet Take 200 mg by mouth daily.    Yes [provider]   Lactobacillus Rhamnosus, GG, (RA PROBIOTIC DIGESTIVE CARE PO) Take 1 capsule by mouth daily.   Yes [provider]  Liraglutide (VICTOZA) 18 MG/3ML SOPN Inject 1.8 mg into the skin daily.   Yes [provider]  magnesium oxide (MAG-OX) 400 MG tablet Take 400 mg by mouth daily.   Yes [provider]  Multiple Vitamins-Minerals (CENTRUM) tablet Take 1 tablet by mouth daily.   Yes [provider]  polyethylene glycol-electrolytes (TRILYTE) 420 g solution Take 4,000 mLs by mouth as directed. 02/04/18  Yes Zianna Dercole, Cristopher Estimable, MD  ranitidine (ZANTAC) 150 MG tablet Take 150 mg by mouth 2 (two) times daily.   Yes [provider]    Allergies as of 02/04/2018  . (No Known Allergies)    Family History  Problem Relation Age of Onset  . Heart attack Sister   . Heart attack Mother   . Heart attack Father   . Colon cancer Neg Hx   . Celiac disease Neg Hx   . Inflammatory bowel disease Neg Hx     Social History   Socioeconomic History  . Marital status: Married    Spouse name: Not on file  . Number of children: Not on file  . Years of education: Not on file  . Highest education level: Not on file  Occupational History  . Not on file  Social Needs  . Financial resource strain: Not  on file  . Food insecurity:    Worry: Not on file    Inability: Not on file  . Transportation needs:    Medical: Not on file    Non-medical: Not on file  Tobacco Use  . Smoking status: Never Smoker  . Smokeless tobacco: Never Used  Substance and Sexual Activity  . Alcohol use: No    Alcohol/week: 0.0 oz  . Drug use: No  . Sexual activity: Not on file  Lifestyle  . Physical activity:    Days per week: Not on file    Minutes per session: Not on file  . Stress: Not on file  Relationships  . Social connections:    Talks on phone: Not on file    Gets together: Not on file    Attends religious service: Not on file    Active member of club or organization: Not on  file    Attends meetings of clubs or organizations: Not on file    Relationship status: Not on file  . Intimate partner violence:    Fear of current or ex partner: Not on file    Emotionally abused: Not on file    Physically abused: Not on file    Forced sexual activity: Not on file  Other Topics Concern  . Not on file  Social History Narrative  . Not on file    Review of Systems: See HPI, otherwise negative ROS  Physical Exam: BP (!) 190/74   Pulse 81   Temp (!) 97.5 F (36.4 C) (Oral)   Resp 16   Ht 5' 2.5" (1.588 m)   Wt 150 lb (68 kg)   SpO2 100%   BMI 27.00 kg/m  General:   Alert,  Well-developed, well-nourished, pleasant and cooperative in NAD opathy. Lungs:  Clear throughout to auscultation.   No wheezes, crackles, or rhonchi. No acute distress. Heart:  Regular rate and rhythm; no murmurs, clicks, rubs,  or gallops. Abdomen: Non-distended, normal bowel sounds.  Soft and nontender without appreciable mass or hepatosplenomegaly.  Pulses:  Normal pulses noted. Extremities:  Without clubbing or edema.  Impression/Plan:   82 year old lady with Hemoccult positive stool chronic intermittent diarrhea. Here for colonoscopy. The risks, benefits, limitations, alternatives and imponderables have been reviewed with the patient. Questions have been answered. All parties are agreeable.    Notice: This dictation was prepared with Dragon dictation along with smaller phrase technology. Any transcriptional errors that result from this process are unintentional and may not be corrected upon review.

## 2018-03-31 NOTE — Op Note (Signed)
[facility] Patient Name: [name] [name] Procedure Date: 03/31/2018 9:55 AM MRN: [id number] Date of Birth: 08/27/1936 Attending MD: Norvel Richards , MD CSN: [id number] Age: 82 Admit Type: Outpatient Procedure:                Colonoscopy Indications:              Chronic diarrhea, Heme positive stool Providers:                Norvel Richards, MD, Lurline Del, RN, Aram Candela Referring MD:              Medicines:                Midazolam 3 mg IV, Meperidine 50 mg IV, Ondansetron                            4 mg IV Complications:            No immediate complications. Estimated Blood Loss:     Estimated blood loss was minimal. Procedure:                Pre-Anesthesia Assessment:                           - Prior to the procedure, a History and Physical                            was performed, and patient medications and                            allergies were reviewed. The patient's tolerance of                            previous anesthesia was also reviewed. The risks                            and benefits of the procedure and the sedation                            options and risks were discussed with the patient.                            All questions were answered, and informed consent                            was obtained. Prior Anticoagulants: The patient has                            taken no previous anticoagulant or antiplatelet                            agents. ASA Grade Assessment: II - A patient with  mild systemic disease. After reviewing the risks                            and benefits, the patient was deemed in                            satisfactory condition to undergo the procedure.                           After obtaining informed consent, the colonoscope                            was passed under direct vision. Throughout the                            procedure, the patient's blood pressure,  pulse, and                            oxygen saturations were monitored continuously. The                            EC-[id number] ([id number]) scope was introduced through                            the anus and advanced to the the cecum, identified                            by appendiceal orifice and ileocecal valve. The                            colonoscopy was performed without difficulty. The                            patient tolerated the procedure well. The quality                            of the bowel preparation was adequate. The                            ileocecal valve, appendiceal orifice, and rectum                            were photographed. The entire colon was well                            visualized. Scope In: 10:34:24 AM Scope Out: 10:51:46 AM Scope Withdrawal Time: 0 hours 9 minutes 23 seconds  Total Procedure Duration: 0 hours 17 minutes 22 seconds  Findings:      The perianal and digital rectal examinations were normal.      Scattered small-mouthed diverticula were found in the sigmoid colon.       This was biopsied with a cold forceps for histology. Estimated blood       loss was minimal.      Internal hemorrhoids were found during retroflexion.  The hemorrhoids       were mild, small and Grade I (internal hemorrhoids that do not       prolapse). Segmental biopsies of right and left colon take taken to       further evaluate diarrhea      The exam was otherwise without abnormality on direct and retroflexion       views. Impression:               Status post segmental biopsy.                           - Diverticulosis in the sigmoid colon. Biopsied.                           - Internal hemorrhoids.                           - The examination was otherwise normal on direct                            and retroflexion views. Moderate Sedation:      Moderate (conscious) sedation was administered by the endoscopy nurse       and supervised by the endoscopist. The  following parameters were       monitored: oxygen saturation, heart rate, blood pressure, respiratory       rate, EKG, adequacy of pulmonary ventilation, and response to care.       Total physician intraservice time was 20 minutes. Recommendation:           - Patient has a contact number available for                            emergencies. The signs and symptoms of potential                            delayed complications were discussed with the                            patient. Return to normal activities tomorrow.                            Written discharge instructions were provided to the                            patient.                           - Resume previous diet. Follow up on pathology. No                            future colonoscopy recommended. Procedure Code(s):        --- Professional ---                           [id number], Colonoscopy, flexible; diagnostic, including  collection of specimen(s) by brushing or washing,                            when performed (separate procedure)                           G0500, Moderate sedation services provided by the                            same physician or other qualified health care                            professional performing a gastrointestinal                            endoscopic service that sedation supports,                            requiring the presence of an independent trained                            observer to assist in the monitoring of the                            patient's level of consciousness and physiological                            status; initial 15 minutes of intra-service time;                            patient age 62 years or older (additional time may                            be reported with [phone], as appropriate) Diagnosis Code(s):        --- Professional ---                           K64.0, First degree hemorrhoids                           K52.9,  Noninfective gastroenteritis and colitis,                            unspecified                           R19.5, Other fecal abnormalities                           K57.30, Diverticulosis of large intestine without                            perforation or abscess without bleeding CPT copyright 2017 American Medical Association. All rights reserved. The codes documented in this report are preliminary and upon coder review may  be revised to meet current compliance requirements. Herbie Baltimore  Hilton Cork, MD Norvel Richards, MD 03/31/2018 10:58:19 AM This report has been signed electronically. Number of Addenda: 0

## 2018-04-04 ENCOUNTER — Encounter: Payer: Self-pay | Admitting: Internal Medicine

## 2018-04-07 ENCOUNTER — Encounter (HOSPITAL_COMMUNITY): Payer: Self-pay | Admitting: Internal Medicine

## 2018-04-07 ENCOUNTER — Telehealth: Payer: Self-pay

## 2018-04-07 NOTE — Telephone Encounter (Signed)
Reminder in epic °

## 2018-04-07 NOTE — Telephone Encounter (Signed)
Per RMR- Send letter to patient.  Send copy of letter with path to referring provider and PCP.   Would offer a f/u appt wn next 3 months if needed for diarrhea

## 2018-05-19 ENCOUNTER — Encounter: Payer: Self-pay | Admitting: Internal Medicine

## 2018-08-24 ENCOUNTER — Ambulatory Visit: Payer: Medicare Other | Admitting: Gastroenterology

## 2018-08-24 ENCOUNTER — Encounter: Payer: Self-pay | Admitting: Gastroenterology

## 2018-08-24 VITALS — BP 145/76 | HR 89 | Temp 96.8°F | Ht 62.5 in | Wt 147.2 lb

## 2018-08-24 DIAGNOSIS — K529 Noninfective gastroenteritis and colitis, unspecified: Secondary | ICD-10-CM

## 2018-08-24 DIAGNOSIS — D649 Anemia, unspecified: Secondary | ICD-10-CM

## 2018-08-24 NOTE — Patient Instructions (Signed)
1. Continue to cut back on your artificial sweeteners, limiting as much as possible. 2. Refer to the [place] food chart provided today.  Limit foods in the high FODMAP column as these foods tend to create more gas and bloating.  You do not have to completely eliminate any of these foods, this chart is meant to help you identify any foods that you may be eating frequently which could be contributing to your symptoms. 3. You can try Gas-X or Beano to help with gas.  These are both over-the-counter. 4. Return to the office as needed.

## 2018-08-24 NOTE — Assessment & Plan Note (Signed)
She has been doing much better over the past week with change in diet, eliminating artificial sweeteners.  Extensive evaluation as previously outlined.  Would encourage continuing to limit artificial sweeteners.  She complains of gas as well.  She should limit foods in the high FODMAP column on diet provided today.  Can try Gas-X or Beano over-the-counter.  Would encourage her to continue to look for food sources that contribute to her diarrhea, and a stool diary if needed.  She was offered a follow-up office visit in 3 to 4 months, she prefers to call if needed.

## 2018-08-24 NOTE — Progress Notes (Signed)
Primary Care Physician: Rory Percy, MD  Primary Gastroenterologist:  Garfield Cornea, MD   Chief Complaint  Patient presents with  . Anemia  . Diarrhea    QOD usually. Change diet recently had episode one time in week    HPI: [name] [name] is a 82 y.o. female here for follow-up of chronic diarrhea and anemia.  She has had chronic diarrhea for over 8 years.  Colonoscopy in 2017 by Dr. Britta Mccreedy with negative random colon biopsies.  Celiac serologies negative in March 2019.  Thyroid function normal.  Stool test negative for infection.  Back in July 2019 to evaluate anemia, Hemoccult positive stool, and chronic diarrhea.  Random colon biopsies negative.  She had diverticulosis.  Patient states she has changed her diet somewhat over the past week.  PCP recommended to her eliminating artificial sweeteners to see how this would benefit her diarrhea.  She started this about a week ago.  Has almost completely eliminated artificial sweeteners, diarrhea significantly improved.  She has been almost a week without diarrhea.  Before that was having every other day.  Complains of a lot of gas.  She is already on a probiotic.  Heartburn controlled on Zantac.  No vomiting.  No significant abdominal pain.  No melena or rectal bleeding.  Husband is with her today, he notices friends with her diarrhea.  Tends to be when eating out, the eat breakfast twice per week.  She generally has 3 cups of coffee with artificial sweeteners, large pancake with syrup with artificial sweeteners as well.  Will have diarrhea after this type of meal.  Has been much improved with recent change.    Current Outpatient Medications  Medication Sig Dispense Refill  . aspirin EC 81 MG tablet Take 81 mg by mouth daily.    Marland Kitchen atorvastatin (LIPITOR) 40 MG tablet Take 20 mg by mouth daily.     . Calcium Citrate-Vitamin D (CALCIUM CITRATE+D3 PETITES) 200-250 MG-UNIT TABS Take 1 tablet by mouth daily.    . Cyanocobalamin (B-12) 1000 MCG  TABS Take 1,000 mcg by mouth daily.     . diclofenac sodium (VOLTAREN) 1 % GEL Apply 2 g topically at bedtime.    . DULoxetine (CYMBALTA) 60 MG capsule Take 60 mg by mouth daily.    . enalapril (VASOTEC) 20 MG tablet Take 20 mg by mouth daily.    Marland Kitchen HUMALOG KWIKPEN 100 UNIT/ML KwikPen 3 (three) times daily as needed.  12  . hydrochlorothiazide (HYDRODIURIL) 25 MG tablet Take 25 mg by mouth every morning.     . insulin glargine (LANTUS) 100 UNIT/ML injection Inject 30 Units into the skin every morning.     . labetalol (NORMODYNE) 200 MG tablet Take 200 mg by mouth daily.     . Lactobacillus Rhamnosus, GG, (RA PROBIOTIC DIGESTIVE CARE PO) Take 1 capsule by mouth daily.    . magnesium oxide (MAG-OX) 400 MG tablet Take 400 mg by mouth daily.    . Multiple Vitamins-Minerals (CENTRUM) tablet Take 1 tablet by mouth daily.    . ranitidine (ZANTAC) 150 MG tablet Take 150 mg by mouth 2 (two) times daily.     No current facility-administered medications for this visit.     Allergies as of 08/24/2018  . (No Known Allergies)    ROS:  General: Negative for anorexia, weight loss, fever, chills, fatigue, weakness. ENT: Negative for hoarseness, difficulty swallowing , nasal congestion. CV: Negative for chest pain, angina, palpitations, dyspnea on exertion, peripheral  edema.  Respiratory: Negative for dyspnea at rest, dyspnea on exertion, cough, sputum, wheezing.  GI: See history of present illness. GU:  Negative for dysuria, hematuria, urinary incontinence, urinary frequency, nocturnal urination.  Endo: Negative for unusual weight change.    Physical Examination:   BP (!) 145/76   Pulse 89   Temp (!) 96.8 F (36 C) (Oral)   Ht 5' 2.5" (1.588 m)   Wt 147 lb 3.2 oz (66.8 kg)   BMI 26.49 kg/m   General: Well-nourished, well-developed in no acute distress.  Eyes: No icterus. Mouth: Oropharyngeal mucosa moist and pink , no lesions erythema or exudate. Abdomen: Bowel sounds are normal, nontender,  nondistended, no hepatosplenomegaly or masses, no abdominal bruits or hernia , no rebound or guarding.   Extremities: No lower extremity edema. No clubbing or deformities. Neuro: Alert and oriented x 4   Skin: Warm and dry, no jaundice.   Psych: Alert and cooperative, normal mood and affect.  Labs:  Requested from PCP Imaging Studies: No results found.

## 2018-08-24 NOTE — Assessment & Plan Note (Signed)
Patient reports chronic anemia, we have documentation at least back to March.  No evidence of iron deficiency.  Heme positive stool earlier this year evaluated via colonoscopy.  Follow-up on recent labs done by PCP.

## 2018-08-24 NOTE — Progress Notes (Signed)
CC'ED TO PCP 

## 2018-11-01 ENCOUNTER — Ambulatory Visit: Payer: Self-pay | Admitting: Endocrinology

## 2018-11-15 NOTE — Progress Notes (Signed)
Patient ID: [name] [name], female   DOB: [birth year], 83 y.o.   MRN: [id number]           Reason for Appointment: Consultation for Type 2 Diabetes  Referring PCP: Rory Percy   History of Present Illness:          Date of diagnosis of type 2 diabetes mellitus: At age 48   approximately      Background history:  She is a poor historian and not clear when she was started on insulin, probably at least since 2018 She thinks she has been on metformin in the past but this was causing diarrhea She does not know what other medications she has taken No prior A1c records are available except from 11/18  Recent history:   Most recent A1c is 8.2 done on 11/16/2018  INSULIN regimen is: Lantus 30 units in am      Non-insulin hypoglycemic drugs the patient is taking are: None  Current management, blood sugar patterns and problems identified:  She has been on variable doses of Lantus and Humalog in the past  Although she was recently told to take her Humalog based on her blood sugar level 3 times a day she had started getting more tendency to low blood sugars randomly in the last couple of months including at night  However indicates that she sometimes would take the Humalog after eating and not before eating when the blood sugar is going up  She has times had low sugar during the night and once before dinnertime when she did not eat all day  Her mealtimes are very erratic  She also has inconsistent times for taking her Lantus in the mornings and sometimes may not take it at lunchtime   Currently because of her glucose monitor having the wrong date and time and her irregular monitoring not clear what her blood sugar patterns are.  Her history is also somewhat difficult to obtain  Dietary history indicates that she is regularly consuming sweet tea and also sometimes eating unbalanced meals like banana sandwiches  Portion sizes are also somewhat variable  Although still thinks that she is  gaining weight her records indicate her weight to be the same as in 11/19        Side effects from medications have been: Diarrhea from metformin  :    Typical meal intake: Breakfast is   toast, eggs and meat, lunch usually half sandwich with fruit and sweet tea, dinner meat, potato, salad and tea.  Mostly snacks are fruits              Exercise:  None recently  Glucose monitoring:  Done 1+ times a day         Glucometer:  True Metrix.       Blood Glucose readings by time of day and averages from meter review  Recent readings range from 129 up to 221, date and time is inaccurate Blood sugars are variable and no consistent pattern seen including fasting readings  30-day average about 200  Dietician visit, most recent: Few years ago  Weight history:  Wt Readings from Last 3 Encounters:  11/16/18 147 lb (66.7 kg)  08/24/18 147 lb 3.2 oz (66.8 kg)  03/31/18 150 lb (68 kg)    Glycemic control: A1c in 11/19 was 8.3    Lab Results  Component Value Date   HGBA1C 8.2 (A) 11/16/2018   Lab Results  Component Value Date   CREATININE 1.07 (H) 12/02/2017  No results found for: MICRALBCREAT  No results found for: FRUCTOSAMINE  Office Visit on 11/16/2018  Component Date Value Ref Range Status  . Hemoglobin A1C 11/16/2018 8.2* 4.0 - 5.6 % Final    Allergies as of 11/16/2018   No Known Allergies     Medication List       Accurate as of November 16, 2018  2:50 PM. Always use your most recent med list.        aspirin EC 81 MG tablet Take 81 mg by mouth daily.   atorvastatin 40 MG tablet Commonly known as:  LIPITOR Take 20 mg by mouth daily.   B-12 1000 MCG Tabs Take 1,000 mcg by mouth daily.   CALCIUM CITRATE+D3 PETITES 200-250 MG-UNIT Tabs Generic drug:  Calcium Citrate-Vitamin D Take 1 tablet by mouth daily.   CENTRUM tablet Take 1 tablet by mouth daily.   diclofenac sodium 1 % Gel Commonly known as:  VOLTAREN Apply 2 g topically at bedtime.   DULoxetine  60 MG capsule Commonly known as:  CYMBALTA Take 60 mg by mouth daily.   enalapril 20 MG tablet Commonly known as:  VASOTEC Take 20 mg by mouth daily.   hydrochlorothiazide 25 MG tablet Commonly known as:  HYDRODIURIL Take 25 mg by mouth every morning.   Insulin Glargine-Lixisenatide 100-33 UNT-MCG/ML Sopn Commonly known as:  SOLIQUA Inject 26 Units into the skin daily before breakfast.   labetalol 200 MG tablet Commonly known as:  NORMODYNE Take 200 mg by mouth daily.   magnesium oxide 400 MG tablet Commonly known as:  MAG-OX Take 400 mg by mouth daily.   PEPCID 40 MG tablet Generic drug:  famotidine Take 40 mg by mouth daily. TAKE 1 TABLET BY MOUTH ONCE DAILY.   RA PROBIOTIC DIGESTIVE CARE PO Take 1 capsule by mouth daily.       Allergies: No Known Allergies  Past Medical History:  Diagnosis Date  . Chronic back pain   . Chronic diarrhea   . Diabetes ([place])   . GERD (gastroesophageal reflux disease)   . Hyperlipidemia   . Hypertension     Past Surgical History:  Procedure Laterality Date  . ABDOMINAL HYSTERECTOMY    . CARDIAC CATHETERIZATION    . CHOLECYSTECTOMY    . COLONOSCOPY  11/2015   Dr. Britta Mccreedy: hyperplastic polyp from sigmoid colon. biopsies from ascending colon were negative for microscopic colitis.   . COLONOSCOPY N/A 03/31/2018   Dr. Gala Romney: Diverticulosis, random colon biopsies negative, hemorrhoids.  No future screening or surveillance colonoscopy is recommended.  . TONSILLECTOMY    . TRIGGER FINGER RELEASE      Family History  Problem Relation Age of Onset  . Heart attack Sister   . Heart attack Mother   . Diabetes Mother   . Heart attack Father   . Diabetes Son   . Diabetes Maternal Aunt   . Colon cancer Neg Hx   . Celiac disease Neg Hx   . Inflammatory bowel disease Neg Hx     Social History:  reports that she has never smoked. She has never used smokeless tobacco. She reports that she does not drink alcohol or use drugs.   Review  of Systems  Constitutional: Positive for weight gain.  HENT: Negative for headaches.   Eyes: Negative for blurred vision.  Respiratory: Negative for shortness of breath.   Cardiovascular: Negative for chest pain and leg swelling.  Gastrointestinal: Positive for diarrhea.  Endocrine: Positive for fatigue and cold intolerance.  Genitourinary: Negative for frequency.       Occasional difficulty with incontinence  Musculoskeletal: Negative for joint pain.  Skin: Negative for rash.  Neurological: Positive for numbness and balance difficulty.       Sometimes has burning in her feet.  She has numbness in feet and a little in fingers also  Psychiatric/Behavioral: Positive for depressed mood.     Lipid history: Treated by PCP with 40 mg Lipitor long-term Last LDL done by PCP in 11/19 was not available   No results found for: CHOL, HDL, LDLCALC, LDLDIRECT, TRIG, CHOLHDL         Hypertension: Has been present for several years  BP Readings from Last 3 Encounters:  11/16/18 122/70  08/24/18 (!) 145/76  03/31/18 (!) 140/56    Most recent eye exam was in 9/19  Most recent foot exam: 2/20  Currently known complications of diabetes: Diabetic peripheral neuropathy  LABS:  Office Visit on 11/16/2018  Component Date Value Ref Range Status  . Hemoglobin A1C 11/16/2018 8.2* 4.0 - 5.6 % Final    Physical Examination:  BP 122/70 (BP Location: Left Arm, Patient Position: Sitting, Cuff Size: Normal)   Pulse 69   Ht 5' 2.5" (1.588 m)   Wt 147 lb (66.7 kg)   SpO2 96%   BMI 26.46 kg/m   GENERAL:         Patient has mild generalized obesity.    HEENT:         Eye exam shows normal external appearance.  Fundus exam shows no retinopathy.  Oral exam shows normal mucosa .   NECK:   There is no lymphadenopathy  Thyroid is not enlarged and no nodules felt.   Carotids are normal to palpation and no bruit heard  LUNGS:         Chest is symmetrical. Lungs are clear to auscultation.Marland Kitchen     HEART:         Heart sounds:  S1 and S2 are normal. No murmur or click heard., no S3 or S4.   ABDOMEN:   There is no distention present. Liver and spleen are not palpable.  No other mass or tenderness present.    NEUROLOGICAL:   Ankle jerks are absent bilaterally.    Diabetic Foot Exam - Simple   Simple Foot Form Diabetic Foot exam was performed with the following findings:  Yes 11/16/2018 12:16 PM  Visual Inspection See comments:  Yes Sensation Testing See comments:  Yes Pulse Check Posterior Tibialis and Dorsalis pulse intact bilaterally:  Yes Comments Significant onychomycosis with some loss of the nail tissue present Markedly decreased monofilament sensation on the right distally feet and nearly absent on the left            Vibration sense is absent in distal first toes.  MUSCULOSKELETAL:  There is no swelling or deformity of the peripheral joints.     EXTREMITIES:     There is no ankle edema.  SKIN:       No rash or lesions of concern.        ASSESSMENT:  Diabetes type 2 with mild obesity  See history of present illness for detailed discussion of current diabetes management, blood sugar patterns and problems identified  Recent A1c of 8.2 indicates poor control which appears to be about the same as in 11/  Current treatment regimen is only basal insulin recently She apparently has been insulin-dependent for unknown duration of time but also unclear about her previous treatment  regimen has been She thinks she is intolerant to metformin with diarrhea Also because of her inconsistent carbohydrate intake, mealtimes and difficulty with understanding directions she will not be able to take mealtime insulin safely or effectively Also has history of significant hypoglycemia including overnight This may indicate irregular action of Lantus although her timing of taking the medication is irregular as well as she is using an area of her abdomen where she has significant  lipohypertrophy  Complications of diabetes: Peripheral neuropathy with sensory loss, history of proteinuria, unknown status of retinopathy  Given her age and multiple medical problems and duration of diabetes A1c of under 8 % would be adequate without excessive fluctuation or hypoglycemia  Hypertension and hypercholesterolemia: Treated and followed by PCP We will need to obtain records of previous panels   PLAN:    1. Glucose monitoring: She was given a Accu-Chek meter to start checking her blood sugars that can be evaluated with download as well as probably more accurate than the true Metrix she is using . Patient advised to check readings either fasting or 2 hours after meals .   2.  Diabetes education: . Patient will need to be scheduled with a nutritionist for meal planning  3.  Lifestyle changes: . Dietary changes: She will eliminate simple sugars completely . She can use Stevia as a sweetener if she wants to have sweet tea . Exercise regimen: Encouraged her to start exercise bicycle  4.  Medication changes needed: . Change LANTUS to [facility] and discussed the differences between the 2 . Discussed actions of GLP-1 drugs and its role in glucose management along with possible side effects, usually with GI intolerance . Because of her being nave to GLP-1 drugs she will start [place] with only 18 units and increase by 2 units every 3 days until morning sugars are under 150 . Will call if she has any difficulties with tolerating this . Otherwise needs to continue taking this before breakfast daily until next visit . We will hold off on any Humalog  5.  Preventive care needed:  . Urine microalbumin . She needs to have regular foot care, daily foot inspections and continue to use cane for impaired balance  6.  Other problems: She has significant fatigue, slow reflexes and cold intolerance as well as mild dementia.  Needs assessment of thyroid levels at least, not clear if this has  been checked Lipid panel may need to be updated on the next visit We will check chemistry panel today also  Follow-up in about 4 weeks  Counseling time on subjects discussed in assessment and plan sections is over 50% of today's 60 minute visit  Patient Instructions  Check blood sugars on waking up 3 days a week  Also check blood sugars about 2 hours after meals and do this after different meals by rotation  Recommended blood sugar levels on waking up are 90-130 and about 2 hours after meal is 130-160  Please bring your blood sugar monitor to each visit, thank you  Stop TEA with sugar and regular soft drinks, may use Stevia as a sweetener  SOLIQUA: This will replace Lantus. Start with 18 units every morning at breakfast and increase by 2 units every 3 days until morning sugars are below 150 May have mild nausea with this and this should get better over time. Call if having any questions  At the point when the blood sugars in the mornings are below 150 continue the same dose of [place]  Do not take any Humalog  Make sure you avoid the area in the abdomen on the right side where there is scar tissue  Start using exercise bike daily and gradually increase duration         Consultation note has been sent to the referring physician  Elayne Snare 11/16/2018, 2:50 PM   Note: This office note was prepared with Dragon voice recognition system technology. Any transcriptional errors that result from this process are unintentional.

## 2018-11-16 ENCOUNTER — Other Ambulatory Visit: Payer: Self-pay

## 2018-11-16 ENCOUNTER — Encounter: Payer: Self-pay | Admitting: Endocrinology

## 2018-11-16 ENCOUNTER — Ambulatory Visit: Payer: Medicare Other | Admitting: Endocrinology

## 2018-11-16 VITALS — BP 122/70 | HR 69 | Ht 62.5 in | Wt 147.0 lb

## 2018-11-16 DIAGNOSIS — Z794 Long term (current) use of insulin: Secondary | ICD-10-CM | POA: Diagnosis not present

## 2018-11-16 DIAGNOSIS — R5383 Other fatigue: Secondary | ICD-10-CM

## 2018-11-16 DIAGNOSIS — E1165 Type 2 diabetes mellitus with hyperglycemia: Secondary | ICD-10-CM | POA: Diagnosis not present

## 2018-11-16 DIAGNOSIS — I10 Essential (primary) hypertension: Secondary | ICD-10-CM | POA: Diagnosis not present

## 2018-11-16 LAB — URINALYSIS, ROUTINE W REFLEX MICROSCOPIC
BILIRUBIN URINE: NEGATIVE
Hgb urine dipstick: NEGATIVE
KETONES UR: NEGATIVE
LEUKOCYTE UA: NEGATIVE
NITRITE: NEGATIVE
RBC / HPF: NONE SEEN (ref 0–?)
SPECIFIC GRAVITY, URINE: 1.01 (ref 1.000–1.030)
TOTAL PROTEIN, URINE-UPE24: 30 — AB
URINE GLUCOSE: NEGATIVE
UROBILINOGEN UA: 0.2 (ref 0.0–1.0)
pH: 6 (ref 5.0–8.0)

## 2018-11-16 LAB — BASIC METABOLIC PANEL
BUN: 27 mg/dL — ABNORMAL HIGH (ref 6–23)
CO2: 28 mEq/L (ref 19–32)
Calcium: 9.8 mg/dL (ref 8.4–10.5)
Chloride: 104 mEq/L (ref 96–112)
Creatinine, Ser: 0.93 mg/dL (ref 0.40–1.20)
GFR: 57.89 mL/min — ABNORMAL LOW (ref >60.00)
GLUCOSE: 50 mg/dL — AB (ref 70–99)
POTASSIUM: 4 meq/L (ref 3.5–5.1)
SODIUM: 139 meq/L (ref 135–145)

## 2018-11-16 LAB — MICROALBUMIN / CREATININE URINE RATIO
Creatinine,U: 39.7 mg/dL
Microalb Creat Ratio: 62.5 mg/g — ABNORMAL HIGH (ref 0.0–30.0)
Microalb, Ur: 24.8 mg/dL — ABNORMAL HIGH (ref 0.0–1.9)

## 2018-11-16 LAB — POCT GLYCOSYLATED HEMOGLOBIN (HGB A1C): HEMOGLOBIN A1C: 8.2 % — AB (ref 4.0–5.6)

## 2018-11-16 LAB — TSH: TSH: 2.42 u[IU]/mL (ref 0.35–4.50)

## 2018-11-16 MED ORDER — GLUCOSE BLOOD VI STRP: ORAL_STRIP | 3 refills | Status: DC

## 2018-11-16 MED ORDER — INSULIN GLARGINE-LIXISENATIDE 100-33 UNT-MCG/ML ~~LOC~~ SOPN: 26.0000 [IU] | PEN_INJECTOR | Freq: Every day | SUBCUTANEOUS | 1 refills | Status: DC

## 2018-11-16 NOTE — Patient Instructions (Addendum)
Check blood sugars on waking up 3 days a week  Also check blood sugars about 2 hours after meals and do this after different meals by rotation  Recommended blood sugar levels on waking up are 90-130 and about 2 hours after meal is 130-160  Please bring your blood sugar monitor to each visit, thank you  Stop TEA with sugar and regular soft drinks, may use Stevia as a sweetener  SOLIQUA: This will replace Lantus. Start with 18 units every morning at breakfast and increase by 2 units every 3 days until morning sugars are below 150 May have mild nausea with this and this should get better over time. Call if having any questions  At the point when the blood sugars in the mornings are below 150 continue the same dose of [place] Do not take any Humalog  Make sure you avoid the area in the abdomen on the right side where there is scar tissue  Start using exercise bike daily and gradually increase duration

## 2018-11-17 NOTE — Progress Notes (Signed)
Please call to let patient know that the lab shows normal thyroid levels but her blood sugar was 50

## 2018-11-18 NOTE — Progress Notes (Signed)
Reviewed labs from 07/2018. Hgb 10.9 (normal 11.2-15.9), MCV 87, ferritin71, creatinin 1.08H  Normocytic anemia, normal ferritin, nearly normal.

## 2018-12-21 ENCOUNTER — Ambulatory Visit: Payer: Self-pay | Admitting: Endocrinology

## 2018-12-21 ENCOUNTER — Ambulatory Visit: Payer: Self-pay | Admitting: Dietician

## 2019-01-23 ENCOUNTER — Other Ambulatory Visit: Payer: Self-pay | Admitting: Endocrinology

## 2019-01-28 ENCOUNTER — Ambulatory Visit: Payer: Self-pay | Admitting: Dietician

## 2019-01-28 ENCOUNTER — Ambulatory Visit: Payer: Self-pay | Admitting: Endocrinology

## 2019-02-18 LAB — HEMOGLOBIN A1C: Hemoglobin A1C: 7.3

## 2019-02-22 ENCOUNTER — Other Ambulatory Visit: Payer: Self-pay

## 2019-02-22 ENCOUNTER — Telehealth: Payer: Self-pay

## 2019-02-22 ENCOUNTER — Encounter: Payer: Self-pay | Admitting: Endocrinology

## 2019-02-22 ENCOUNTER — Encounter: Payer: Medicare Other | Admitting: Endocrinology

## 2019-02-22 NOTE — Progress Notes (Signed)
This encounter was created in error - please disregard.

## 2019-02-22 NOTE — Telephone Encounter (Signed)
At Dr. Ronnie Derby request, called pt to reschedule 02/22/19 appt. No answer on both home and cell #.

## 2019-02-22 NOTE — Telephone Encounter (Signed)
-----   Message from Elayne Snare, MD sent at 02/22/2019  2:59 PM EDT ----- Regarding: No-show She was unable to be contacted today for her appointment.  Please reschedule

## 2019-02-23 ENCOUNTER — Ambulatory Visit (INDEPENDENT_AMBULATORY_CARE_PROVIDER_SITE_OTHER): Payer: Medicare Other | Admitting: Endocrinology

## 2019-02-23 ENCOUNTER — Encounter: Payer: Self-pay | Admitting: Endocrinology

## 2019-02-23 ENCOUNTER — Other Ambulatory Visit: Payer: Self-pay

## 2019-02-23 DIAGNOSIS — E1165 Type 2 diabetes mellitus with hyperglycemia: Secondary | ICD-10-CM | POA: Diagnosis not present

## 2019-02-23 DIAGNOSIS — Z794 Long term (current) use of insulin: Secondary | ICD-10-CM | POA: Diagnosis not present

## 2019-02-23 DIAGNOSIS — R809 Proteinuria, unspecified: Secondary | ICD-10-CM

## 2019-02-23 NOTE — Progress Notes (Signed)
Patient ID: [name] [name], female   DOB: 24-Jul-1936, 83 y.o.   MRN: [id number]           Reason for Appointment: Follow-up for Type 2 Diabetes   Today's office visit was provided via telemedicine using audio technique Explained to the patient and the the limitations of evaluation and management by telemedicine and the availability of in person appointments.  The patient understood the limitations and agreed to proceed. Patient also understood that the telehealth visit is billable. . Location of the patient: Home . Location of the provider: Office Only the patient and myself were participating in the encounter    Referring PCP: Rory Percy   History of Present Illness:          Date of diagnosis of type 2 diabetes mellitus: At age 29   approximately      Background history:  She is a poor historian and not clear when she was started on insulin, probably at least since 2018 She thinks she has been on metformin in the past but this was causing diarrhea She does not know what other medications she has taken No prior A1c records are available except from 11/18  Recent history:   She has not come back for her follow-up since her initial consultation in 2/20  Most recent A1c is reportedly 7.3 done in 5/20, by PCP, previously was 8.2 done on 11/16/2018  INSULIN regimen is: Soliqua 24 units in am      Non-insulin hypoglycemic drugs the patient is taking are: None  Current management, blood sugar patterns and problems identified:  She has been on [place] instead of Lantus since her initial consultation  She was also told to stop her Humalog which she was taking based on her blood sugar  On her last visit also she had a glucose of 50 in the lab  Although she was started on only 18 units of Soliqua by itself she did gradually increase the dose based on her morning readings and is now taking 6 units more  She is a little forgetful at times and may not always take her Soliqua before  breakfast and will take it later in the morning before lunch  MORNING blood sugars recently range from 91 up to 165 but not clear which readings are before breakfast and some are after  She however does not report any hypoglycemic symptoms  She believes she has lost 7 pounds since her last visit, she is not as hungry with taking Soliqua  However has not checked blood sugars after meals as directed and only fasting       Side effects from medications have been: Diarrhea from metformin  :    Typical meal intake: Breakfast is   toast, eggs and meat, lunch usually half sandwich with fruit and sweet tea, dinner meat, potato, salad and tea.  Mostly snacks are fruits              Exercise:  She thinks she is trying to get on her exercise bike and also do a little walking as tolerated but limited by various aches and pains  Glucose monitoring:  Done 1 times a day         Glucometer:  Accu-Chek     Blood Glucose readings by patient recall as above   Dietician visit, most recent: Few years ago  Weight history:  Wt Readings from Last 3 Encounters:  11/16/18 147 lb (66.7 kg)  08/24/18 147 lb 3.2 oz (  66.8 kg)  03/31/18 150 lb (68 kg)    Glycemic control: A1c in 11/19 was 8.3    Lab Results  Component Value Date   HGBA1C 8.2 (A) 11/16/2018   Lab Results  Component Value Date   MICROALBUR 24.8 (H) 11/16/2018   CREATININE 0.93 11/16/2018   Lab Results  Component Value Date   MICRALBCREAT 62.5 (H) 11/16/2018    No results found for: FRUCTOSAMINE  No visits with results within 1 Week(s) from this visit.  Latest known visit with results is:  Office Visit on 11/16/2018  Component Date Value Ref Range Status  . Hemoglobin A1C 11/16/2018 8.2* 4.0 - 5.6 % Final  . TSH 11/16/2018 2.42  0.35 - 4.50 uIU/mL Final  . Sodium 11/16/2018 139  135 - 145 mEq/L Final  . Potassium 11/16/2018 4.0  3.5 - 5.1 mEq/L Final  . Chloride 11/16/2018 104  96 - 112 mEq/L Final  . CO2 11/16/2018 28  19  - 32 mEq/L Final  . Glucose, Bld 11/16/2018 50* 70 - 99 mg/dL Final  . BUN 11/16/2018 27* 6 - 23 mg/dL Final  . Creatinine, Ser 11/16/2018 0.93  0.40 - 1.20 mg/dL Final  . Calcium 11/16/2018 9.8  8.4 - 10.5 mg/dL Final  . GFR 11/16/2018 57.89* >60.00 mL/min Final  . Color, Urine 11/16/2018 YELLOW  Yellow;Lt. Yellow;Straw;Dark Yellow;Amber;Green;Red;Brown Final  . APPearance 11/16/2018 Sl Cloudy* Clear;Turbid;Slightly Cloudy;Cloudy Final  . Specific Gravity, Urine 11/16/2018 1.010  1.000 - 1.030 Final  . pH 11/16/2018 6.0  5.0 - 8.0 Final  . Total Protein, Urine 11/16/2018 30* Negative Final  . Urine Glucose 11/16/2018 NEGATIVE  Negative Final  . Ketones, ur 11/16/2018 NEGATIVE  Negative Final  . Bilirubin Urine 11/16/2018 NEGATIVE  Negative Final  . Hgb urine dipstick 11/16/2018 NEGATIVE  Negative Final  . Urobilinogen, UA 11/16/2018 0.2  0.0 - 1.0 Final  . Leukocytes,Ua 11/16/2018 NEGATIVE  Negative Final  . Nitrite 11/16/2018 NEGATIVE  Negative Final  . WBC, UA 11/16/2018 7-10/hpf* 0-2/hpf Final  . RBC / HPF 11/16/2018 none seen  0-2/hpf Final  . Squamous Epithelial / LPF 11/16/2018 Rare(0-4/hpf)  Rare(0-4/hpf) Final  . Bacteria, UA 11/16/2018 Many(>50/hpf)* None Final  . Microalb, Ur 11/16/2018 24.8* 0.0 - 1.9 mg/dL Final  . Creatinine,U 11/16/2018 39.7  mg/dL Final  . Microalb Creat Ratio 11/16/2018 62.5* 0.0 - 30.0 mg/g Final    Allergies as of 02/23/2019   No Known Allergies     Medication List       Accurate as of Feb 23, 2019  1:52 PM. If you have any questions, ask your nurse or doctor.        aspirin EC 81 MG tablet Take 81 mg by mouth daily.   atorvastatin 40 MG tablet Commonly known as:  LIPITOR Take 20 mg by mouth daily.   B-12 1000 MCG Tabs Take 1,000 mcg by mouth daily.   Calcium Citrate+D3 Petites 200-250 MG-UNIT Tabs Generic drug:  Calcium Citrate-Vitamin D Take 1 tablet by mouth daily.   Centrum tablet Take 1 tablet by mouth daily.   diclofenac  sodium 1 % Gel Commonly known as:  VOLTAREN Apply 2 g topically at bedtime.   DULoxetine 60 MG capsule Commonly known as:  CYMBALTA Take 60 mg by mouth daily.   enalapril 20 MG tablet Commonly known as:  VASOTEC Take 20 mg by mouth daily.   glucose blood test strip Use as instructed to check 3 times daily   hydrochlorothiazide 25 MG tablet  Commonly known as:  HYDRODIURIL Take 25 mg by mouth every morning.   labetalol 200 MG tablet Commonly known as:  NORMODYNE Take 200 mg by mouth daily.   magnesium oxide 400 MG tablet Commonly known as:  MAG-OX Take 400 mg by mouth daily.   Pepcid 40 MG tablet Generic drug:  famotidine Take 40 mg by mouth daily. TAKE 1 TABLET BY MOUTH ONCE DAILY.   RA PROBIOTIC DIGESTIVE CARE PO Take 1 capsule by mouth daily.   Soliqua 100-33 UNT-MCG/ML Sopn Generic drug:  Insulin Glargine-Lixisenatide INJECT 26 UNITS INTO THE SKIN DAILY BEFORE BREAKFAST       Allergies: No Known Allergies  Past Medical History:  Diagnosis Date  . Chronic back pain   . Chronic diarrhea   . Diabetes ([place])   . GERD (gastroesophageal reflux disease)   . Hyperlipidemia   . Hypertension     Past Surgical History:  Procedure Laterality Date  . ABDOMINAL HYSTERECTOMY    . CARDIAC CATHETERIZATION    . CHOLECYSTECTOMY    . COLONOSCOPY  11/2015   Dr. Britta Mccreedy: hyperplastic polyp from sigmoid colon. biopsies from ascending colon were negative for microscopic colitis.   . COLONOSCOPY N/A 03/31/2018   Dr. Gala Romney: Diverticulosis, random colon biopsies negative, hemorrhoids.  No future screening or surveillance colonoscopy is recommended.  . TONSILLECTOMY    . TRIGGER FINGER RELEASE      Family History  Problem Relation Age of Onset  . Heart attack Sister   . Heart attack Mother   . Diabetes Mother   . Heart attack Father   . Diabetes Son   . Diabetes Maternal Aunt   . Colon cancer Neg Hx   . Celiac disease Neg Hx   . Inflammatory bowel disease Neg Hx      Social History:  reports that she has never smoked. She has never used smokeless tobacco. She reports that she does not drink alcohol or use drugs.   Review of Systems   Lipid history: Treated by PCP with 40 mg Lipitor long-term Last LDL done by PCP in 11/19 was not available   No results found for: CHOL, HDL, LDLCALC, LDLDIRECT, TRIG, CHOLHDL         Hypertension: Has been present for several years Recently seen by PCP  BP Readings from Last 3 Encounters:  11/16/18 122/70  08/24/18 (!) 145/76  03/31/18 (!) 140/56    Most recent eye exam was in 9/19  Most recent foot exam: 2/20  Currently known complications of diabetes: Diabetic peripheral neuropathy  She thinks the numbness in her feet is a little less  She still has difficulty with balance and has had a fall with minor injury and recently complaining of back pain  LABS:  Not available  Physical Examination:  There were no vitals taken for this visit.      ASSESSMENT:  Diabetes type 2 with mild obesity  See history of present illness for detailed discussion of current diabetes management, blood sugar patterns and problems identified  Her last A1c was reportedly 7.3, not confirmed as yet  Her control is better with using [place] compared to Lantus which was not controlling postprandial readings and was causing overnight hypoglycemia previously She is however checking only fasting readings at home and not after meals Currently blood sugars are fluctuating somewhat in the morning but as low as 91 fasting  Given her age and multiple medical problems and duration of diabetes A1c of under 8 % is adequate  PLAN:    . Glucose monitoring: She will continue to use the Accu-Chek monitor . However if she needs to start checking postprandial readings alternating with fasting  2.  Diabetes education: . May consider consultation with dietitian  3.  Lifestyle changes: . Balanced meals with limited fats and avoiding  excessive simple sugars and carbohydrates . Exercise regimen: Encouraged her to continue using exercise bicycle  4.  Medication changes needed: None at present However she will need to let us know if she has frequent readings after meals over 200  5.  Preventive care needed:  . Urine microalbumin to be rechecked with improved control and consider adding an ARB drug for history of microalbuminuria   Follow-up in about 3 months unless she has worsening of control  Duration of telephone call =11 minutes, over 50 % of the visit was spent in counseling  There are no Patient Instructions on file for this visit.     Elayne Snare 02/23/2019, 1:52 PM   Note: This office note was prepared with Dragon voice recognition system technology. Any transcriptional errors that result from this process are unintentional.

## 2019-03-25 ENCOUNTER — Ambulatory Visit: Payer: Medicare Other | Admitting: Endocrinology

## 2019-06-21 ENCOUNTER — Telehealth: Payer: Self-pay | Admitting: Endocrinology

## 2019-06-21 NOTE — Telephone Encounter (Signed)
Called pt in an attempt to clarify if she is wanting past blood work faxed or is she is wanting future blood work faxed to have it done elsewhere. Pt did not answer and does not have a voicemail box set up at this time.

## 2019-06-21 NOTE — Telephone Encounter (Signed)
Patient is requesting for [place] to send over her lab orders to Dr. Lyman Speller office.  Ph # [phone]  Please Advise, Thanks

## 2019-06-21 NOTE — Telephone Encounter (Signed)
Attempted to call pt again. She did not answer.  

## 2019-06-24 ENCOUNTER — Other Ambulatory Visit: Payer: Self-pay

## 2019-06-28 ENCOUNTER — Other Ambulatory Visit: Payer: Self-pay

## 2019-06-28 ENCOUNTER — Ambulatory Visit (INDEPENDENT_AMBULATORY_CARE_PROVIDER_SITE_OTHER): Payer: Medicare Other | Admitting: Endocrinology

## 2019-06-28 ENCOUNTER — Encounter: Payer: Self-pay | Admitting: Endocrinology

## 2019-06-28 VITALS — BP 144/80 | HR 75 | Wt 143.4 lb

## 2019-06-28 DIAGNOSIS — R809 Proteinuria, unspecified: Secondary | ICD-10-CM

## 2019-06-28 DIAGNOSIS — Z23 Encounter for immunization: Secondary | ICD-10-CM

## 2019-06-28 DIAGNOSIS — E782 Mixed hyperlipidemia: Secondary | ICD-10-CM

## 2019-06-28 DIAGNOSIS — Z794 Long term (current) use of insulin: Secondary | ICD-10-CM

## 2019-06-28 DIAGNOSIS — E1165 Type 2 diabetes mellitus with hyperglycemia: Secondary | ICD-10-CM

## 2019-06-28 LAB — COMPREHENSIVE METABOLIC PANEL
ALT: 19 U/L (ref 0–35)
AST: 20 U/L (ref 0–37)
Albumin: 4.1 g/dL (ref 3.5–5.2)
Alkaline Phosphatase: 77 U/L (ref 39–117)
BUN: 23 mg/dL (ref 6–23)
CO2: 27 mEq/L (ref 19–32)
Calcium: 10.3 mg/dL (ref 8.4–10.5)
Chloride: 104 mEq/L (ref 96–112)
Creatinine, Ser: 1.04 mg/dL (ref 0.40–1.20)
GFR: 50.81 mL/min — ABNORMAL LOW (ref >60.00)
Glucose, Bld: 124 mg/dL — ABNORMAL HIGH (ref 70–99)
Potassium: 4 mEq/L (ref 3.5–5.1)
Sodium: 139 mEq/L (ref 135–145)
Total Bilirubin: 0.5 mg/dL (ref 0.2–1.2)
Total Protein: 6.8 g/dL (ref 6.0–8.3)

## 2019-06-28 LAB — POCT GLYCOSYLATED HEMOGLOBIN (HGB A1C): Hemoglobin A1C: 7 % — AB (ref 4.0–5.6)

## 2019-06-28 LAB — LIPID PANEL
Cholesterol: 113 mg/dL (ref 0–200)
HDL: 50.1 mg/dL (ref >39.00)
LDL Cholesterol: 35 mg/dL (ref 0–99)
NonHDL: 62.97
Total CHOL/HDL Ratio: 2
Triglycerides: 138 mg/dL (ref 0.0–149.0)
VLDL: 27.6 mg/dL (ref 0.0–40.0)

## 2019-06-28 LAB — MICROALBUMIN / CREATININE URINE RATIO
Creatinine,U: 78.2 mg/dL
Microalb Creat Ratio: 36.8 mg/g — ABNORMAL HIGH (ref 0.0–30.0)
Microalb, Ur: 28.8 mg/dL — ABNORMAL HIGH (ref 0.0–1.9)

## 2019-06-28 NOTE — Patient Instructions (Signed)
Check blood sugars on waking up 3 days a week  Also check blood sugars about 2 hours after meals and do this after different meals by rotation  Recommended blood sugar levels on waking up are 90-130 and about 2 hours after meal is 130-160  Please bring your blood sugar monitor to each visit, thank you   

## 2019-06-28 NOTE — Progress Notes (Signed)
Patient ID: [name] [name], female   DOB: March 01, 1936, 83 y.o.   MRN: [id number]           Reason for Appointment: Follow-up for Type 2 Diabetes    Referring PCP: Rory Percy   History of Present Illness:          Date of diagnosis of type 2 diabetes mellitus: At age 49   approximately      Background history:  She is a poor historian and not clear when she was started on insulin, probably at least since 2018 She thinks she has been on metformin in the past but this was causing diarrhea She does not know what other medications she has taken No prior A1c records are available except from 11/18  Recent history:   She has not come back for her follow-up since her initial consultation in 2/20  Most recent A1c is reportedly 7, previously was 8.2 done on 11/16/2018  INSULIN regimen is: Soliqua 24 units in am      Non-insulin hypoglycemic drugs the patient is taking are: None  Current management, blood sugar patterns and problems identified:  She has been on [place] instead of Lantus and Humalog since her initial consultation  Blood sugars are being checked mostly fasting in the morning  Although she has variable readings they appear to be relatively better in the last week  Lowest blood sugar was early morning today and 102  She has had 1 or 2 relatively high readings at lunch and dinner but not clear what her readings are after meals  She has limited ability to walk or exercise but is trying to get on her exercise bike periodically  Her weight is down slightly since starting Soliqua       Side effects from medications have been: Diarrhea from metformin  :    Typical meal intake: Breakfast is   toast, eggs and meat, lunch usually half sandwich with fruit and sweet tea, dinner meat, potato, salad and tea.  Mostly snacks are fruits              Exercise:   she is trying to get on her exercise bike, little walking limited by balance  Glucose monitoring:  Done 1 times a day          Glucometer:  Accu-Chek     Blood Glucose readings by download  FASTING range 102-197 Lunchtime 150, dinnertime 171 Overall AVERAGE 141  Dietician visit, most recent: Few years ago  Weight history:  Wt Readings from Last 3 Encounters:  06/28/19 143 lb 6.4 oz (65 kg)  11/16/18 147 lb (66.7 kg)  08/24/18 147 lb 3.2 oz (66.8 kg)    Glycemic control: A1c in 11/19 was 8.3    Lab Results  Component Value Date   HGBA1C 7.0 (A) 06/28/2019   HGBA1C 7.3 02/18/2019   HGBA1C 8.2 (A) 11/16/2018   Lab Results  Component Value Date   MICROALBUR 24.8 (H) 11/16/2018   CREATININE 0.93 11/16/2018   Lab Results  Component Value Date   MICRALBCREAT 62.5 (H) 11/16/2018    No results found for: FRUCTOSAMINE  Office Visit on 06/28/2019  Component Date Value Ref Range Status  . Hemoglobin A1C 06/28/2019 7.0* 4.0 - 5.6 % Final    Allergies as of 06/28/2019   No Known Allergies     Medication List       Accurate as of June 28, 2019 12:44 PM. If you have any questions, ask your nurse  or doctor.        aspirin EC 81 MG tablet Take 81 mg by mouth daily.   atorvastatin 40 MG tablet Commonly known as: LIPITOR Take 20 mg by mouth daily.   B-12 1000 MCG Tabs Take 1,000 mcg by mouth daily.   Calcium Citrate+D3 Petites 200-250 MG-UNIT Tabs Generic drug: Calcium Citrate-Vitamin D Take 1 tablet by mouth daily.   Centrum tablet Take 1 tablet by mouth daily.   diclofenac sodium 1 % Gel Commonly known as: VOLTAREN Apply 2 g topically at bedtime.   DULoxetine 60 MG capsule Commonly known as: CYMBALTA Take 60 mg by mouth daily.   enalapril 20 MG tablet Commonly known as: VASOTEC Take 20 mg by mouth daily.   glucose blood test strip Use as instructed to check 3 times daily   hydrochlorothiazide 25 MG tablet Commonly known as: HYDRODIURIL Take 25 mg by mouth every morning.   labetalol 200 MG tablet Commonly known as: NORMODYNE Take 200 mg by mouth daily.    magnesium oxide 400 MG tablet Commonly known as: MAG-OX Take 400 mg by mouth daily.   Pepcid 40 MG tablet Generic drug: famotidine Take 40 mg by mouth daily. TAKE 1 TABLET BY MOUTH ONCE DAILY.   RA PROBIOTIC DIGESTIVE CARE PO Take 1 capsule by mouth daily.   Soliqua 100-33 UNT-MCG/ML Sopn Generic drug: Insulin Glargine-Lixisenatide INJECT 26 UNITS INTO THE SKIN DAILY BEFORE BREAKFAST       Allergies: No Known Allergies  Past Medical History:  Diagnosis Date  . Chronic back pain   . Chronic diarrhea   . Diabetes ([place])   . GERD (gastroesophageal reflux disease)   . Hyperlipidemia   . Hypertension     Past Surgical History:  Procedure Laterality Date  . ABDOMINAL HYSTERECTOMY    . CARDIAC CATHETERIZATION    . CHOLECYSTECTOMY    . COLONOSCOPY  11/2015   Dr. Britta Mccreedy: hyperplastic polyp from sigmoid colon. biopsies from ascending colon were negative for microscopic colitis.   . COLONOSCOPY N/A 03/31/2018   Dr. Gala Romney: Diverticulosis, random colon biopsies negative, hemorrhoids.  No future screening or surveillance colonoscopy is recommended.  . TONSILLECTOMY    . TRIGGER FINGER RELEASE      Family History  Problem Relation Age of Onset  . Heart attack Sister   . Heart attack Mother   . Diabetes Mother   . Heart attack Father   . Diabetes Son   . Diabetes Maternal Aunt   . Colon cancer Neg Hx   . Celiac disease Neg Hx   . Inflammatory bowel disease Neg Hx     Social History:  reports that she has never smoked. She has never used smokeless tobacco. She reports that she does not drink alcohol or use drugs.   Review of Systems   Lipid history: Treated by PCP with 40 mg Lipitor long-term Last LDL done by PCP in 11/19, report not available   No results found for: CHOL, HDL, LDLCALC, LDLDIRECT, TRIG, CHOLHDL         Hypertension: Has been present for several years treated by PCP   BP Readings from Last 3 Encounters:  06/28/19 (!) 144/80  11/16/18 122/70   08/24/18 (!) 145/76    Most recent eye exam was in 9/19  Most recent foot exam: 2/20  Currently known complications of diabetes: Diabetic peripheral neuropathy  She thinks the numbness in her feet fingers   LABS:  Not available  Physical Examination:  BP (!) 144/80  Pulse 75   Wt 143 lb 6.4 oz (65 kg)   SpO2 98%   BMI 25.81 kg/m       ASSESSMENT:  Diabetes type 2, insulin requiring  See history of present illness for detailed discussion of current diabetes management, blood sugar patterns and problems identified  Her A1c is 7  She is doing very well with using Soliqua as the only diabetes treatment She has blood sugars averaging about 140 fasting which is adequate Also even though her blood sugar may fluctuate based on her diet in the last week they are relatively better in the morning She does tend to forget to check her sugars after meals Also may not be always taking her [place] consistently at the same time before breakfast  PLAN:    . Glucose monitoring: She will need to reduce checking blood sugars in the mornings and alternate with readings after lunch or dinner  . No change in [place] . Encouraged her to modify her meals if she sees consistently high readings after certain foods especially higher fat or high carbohydrate meals . Check urine microalbumin as well as chemistry panel today   Follow-up in about 3 months  Influ[place] vaccine given    Patient Instructions  Check blood sugars on waking up 3 days a week  Also check blood sugars about 2 hours after meals and do this after different meals by rotation  Recommended blood sugar levels on waking up are 90-130 and about 2 hours after meal is 130-160  Please bring your blood sugar monitor to each visit, thank you        Elayne Snare 06/28/2019, 12:44 PM   Note: This office note was prepared with Dragon voice recognition system technology. Any transcriptional errors that result from this  process are unintentional.

## 2019-06-28 NOTE — Progress Notes (Signed)
Please call to let patient know that the lab results are normal and no further action needed, please fax to PCP also

## 2019-07-20 ENCOUNTER — Other Ambulatory Visit: Payer: Self-pay | Admitting: Endocrinology

## 2019-09-27 ENCOUNTER — Ambulatory Visit: Payer: Medicare Other | Admitting: Endocrinology

## 2019-10-05 DIAGNOSIS — R5383 Other fatigue: Secondary | ICD-10-CM | POA: Diagnosis not present

## 2019-10-05 DIAGNOSIS — F419 Anxiety disorder, unspecified: Secondary | ICD-10-CM | POA: Diagnosis not present

## 2019-10-26 ENCOUNTER — Ambulatory Visit: Payer: Medicare Other | Admitting: Endocrinology

## 2019-11-04 ENCOUNTER — Other Ambulatory Visit: Payer: Self-pay

## 2019-11-04 ENCOUNTER — Encounter: Payer: Self-pay | Admitting: Endocrinology

## 2019-11-04 ENCOUNTER — Ambulatory Visit (INDEPENDENT_AMBULATORY_CARE_PROVIDER_SITE_OTHER): Payer: Medicare PPO | Admitting: Endocrinology

## 2019-11-04 VITALS — BP 140/72 | HR 80 | Ht 63.0 in | Wt 144.4 lb

## 2019-11-04 DIAGNOSIS — Z794 Long term (current) use of insulin: Secondary | ICD-10-CM

## 2019-11-04 DIAGNOSIS — E1165 Type 2 diabetes mellitus with hyperglycemia: Secondary | ICD-10-CM | POA: Diagnosis not present

## 2019-11-04 DIAGNOSIS — I10 Essential (primary) hypertension: Secondary | ICD-10-CM

## 2019-11-04 DIAGNOSIS — R7989 Other specified abnormal findings of blood chemistry: Secondary | ICD-10-CM

## 2019-11-04 LAB — COMPREHENSIVE METABOLIC PANEL
ALT: 19 U/L (ref 0–35)
AST: 17 U/L (ref 0–37)
Albumin: 4.2 g/dL (ref 3.5–5.2)
Alkaline Phosphatase: 75 U/L (ref 39–117)
BUN: 45 mg/dL — ABNORMAL HIGH (ref 6–23)
CO2: 27 mEq/L (ref 19–32)
Calcium: 10.1 mg/dL (ref 8.4–10.5)
Chloride: 103 mEq/L (ref 96–112)
Creatinine, Ser: 1.35 mg/dL — ABNORMAL HIGH (ref 0.40–1.20)
GFR: 37.57 mL/min — ABNORMAL LOW (ref >60.00)
Glucose, Bld: 123 mg/dL — ABNORMAL HIGH (ref 70–99)
Potassium: 4.4 mEq/L (ref 3.5–5.1)
Sodium: 139 mEq/L (ref 135–145)
Total Bilirubin: 0.4 mg/dL (ref 0.2–1.2)
Total Protein: 7 g/dL (ref 6.0–8.3)

## 2019-11-04 LAB — LIPID PANEL
Cholesterol: 117 mg/dL (ref 0–200)
HDL: 50.7 mg/dL
LDL Cholesterol: 43 mg/dL (ref 0–99)
NonHDL: 65.99
Total CHOL/HDL Ratio: 2
Triglycerides: 117 mg/dL (ref 0.0–149.0)
VLDL: 23.4 mg/dL (ref 0.0–40.0)

## 2019-11-04 LAB — POCT GLYCOSYLATED HEMOGLOBIN (HGB A1C): Hemoglobin A1C: 6.8 % — AB (ref 4.0–5.6)

## 2019-11-04 LAB — T4, FREE: Free T4: 0.71 ng/dL (ref 0.60–1.60)

## 2019-11-04 LAB — TSH: TSH: 2.51 u[IU]/mL (ref 0.35–4.50)

## 2019-11-04 NOTE — Patient Instructions (Signed)
Check blood sugars on waking up 3-4 days a week  Also check blood sugars about 2 hours after meals and do this after different meals by rotation  Recommended blood sugar levels on waking up are 90-130 and about 2 hours after meal is 130-160  Please bring your blood sugar monitor to each visit, thank you   

## 2019-11-04 NOTE — Progress Notes (Signed)
Patient ID: [name] [name], female   DOB: 06/02/1936, 84 y.o.   MRN: [id number]           Reason for Appointment: Follow-up for Type 2 Diabetes    Referring PCP: Rory Percy   History of Present Illness:          Date of diagnosis of type 2 diabetes mellitus: At age 58   approximately      Background history:  She is a poor historian and not clear when she was started on insulin, probably at least since 2018 She thinks she has been on metformin in the past but this was causing diarrhea She does not know what other medications she has taken No prior A1c records are available except from 11/18  Recent history:   Most recent A1c is 6.8 compared to 7  INSULIN regimen is: Soliqua 26 units in am      Non-insulin hypoglycemic drugs the patient is taking are: None  Current management, blood sugar patterns and problems identified:  She has been on [place] for about a year now  Blood sugars continue to improve  Also has no hypoglycemia reported  Her meter was not functioning today and difficult to know what her recent patterns are  Again despite reminders she only checks blood sugars in the mornings  Recent blood sugar range apparently is about 108-157 in the last few weeks  Weight is about the same  She is generally trying to eat healthy meals  Not able to do much exercise, occasionally may use her exercise bike       Side effects from medications have been: Diarrhea from metformin  :    Typical meal intake: Breakfast is   toast, eggs and meat, lunch usually half sandwich with fruit and sweet tea, dinner meat, potato, salad and tea.  Mostly snacks are fruits              Exercise:   she is trying to get on her exercise bike, little walking limited by balance  Glucose monitoring:  Done 1 times a day         Glucometer:  Accu-Chek     Blood Glucose readings by recall as above  PREVIOUS readings:  FASTING range 102-197 Lunchtime 150, dinnertime 171 Overall AVERAGE  141  Dietician visit, most recent: Few years ago  Weight history:  Wt Readings from Last 3 Encounters:  11/04/19 144 lb 6.4 oz (65.5 kg)  06/28/19 143 lb 6.4 oz (65 kg)  11/16/18 147 lb (66.7 kg)    Glycemic control: A1c in 11/19 was 8.3    Lab Results  Component Value Date   HGBA1C 6.8 (A) 11/04/2019   HGBA1C 7.0 (A) 06/28/2019   HGBA1C 7.3 02/18/2019   Lab Results  Component Value Date   MICROALBUR 28.8 (H) 06/28/2019   LDLCALC 43 11/04/2019   CREATININE 1.35 (H) 11/04/2019   Lab Results  Component Value Date   MICRALBCREAT 36.8 (H) 06/28/2019    No results found for: FRUCTOSAMINE  Office Visit on 11/04/2019  Component Date Value Ref Range Status  . Hemoglobin A1C 11/04/2019 6.8* 4.0 - 5.6 % Final  . Cholesterol 11/04/2019 117  0 - 200 mg/dL Final   ATP III Classification       Desirable:  < 200 mg/dL               Borderline High:  200 - 239 mg/dL          High:  > =  240 mg/dL  . Triglycerides 11/04/2019 117.0  0.0 - 149.0 mg/dL Final   Normal:  <150 mg/dLBorderline High:  150 - 199 mg/dL  . HDL 11/04/2019 50.70  >39.00 mg/dL Final  . VLDL 11/04/2019 23.4  0.0 - 40.0 mg/dL Final  . LDL Cholesterol 11/04/2019 43  0 - 99 mg/dL Final  . Total CHOL/HDL Ratio 11/04/2019 2   Final                  Men          Women1/2 Average Risk     3.4          3.3Average Risk          5.0          4.42X Average Risk          9.6          7.13X Average Risk          15.0          11.0                      . NonHDL 11/04/2019 65.99   Final   NOTE:  Non-HDL goal should be 30 mg/dL higher than patient's LDL goal (i.e. LDL goal of < 70 mg/dL, would have non-HDL goal of < 100 mg/dL)  . Free T4 11/04/2019 0.71  0.60 - 1.60 ng/dL Final   Comment: Specimens from patients who are undergoing biotin therapy and /or ingesting biotin supplements may contain high levels of biotin.  The higher biotin concentration in these specimens interferes with this Free T4 assay.  Specimens that contain high  levels  of biotin may cause false high results for this Free T4 assay.  Please interpret results in light of the total clinical presentation of the patient.    Marland Kitchen TSH 11/04/2019 2.51  0.35 - 4.50 uIU/mL Final  . Sodium 11/04/2019 139  135 - 145 mEq/L Final  . Potassium 11/04/2019 4.4  3.5 - 5.1 mEq/L Final  . Chloride 11/04/2019 103  96 - 112 mEq/L Final  . CO2 11/04/2019 27  19 - 32 mEq/L Final  . Glucose, Bld 11/04/2019 123* 70 - 99 mg/dL Final  . BUN 11/04/2019 45* 6 - 23 mg/dL Final  . Creatinine, Ser 11/04/2019 1.35* 0.40 - 1.20 mg/dL Final  . Total Bilirubin 11/04/2019 0.4  0.2 - 1.2 mg/dL Final  . Alkaline Phosphatase 11/04/2019 75  39 - 117 U/L Final  . AST 11/04/2019 17  0 - 37 U/L Final  . ALT 11/04/2019 19  0 - 35 U/L Final  . Total Protein 11/04/2019 7.0  6.0 - 8.3 g/dL Final  . Albumin 11/04/2019 4.2  3.5 - 5.2 g/dL Final  . GFR 11/04/2019 37.57* >60.00 mL/min Final  . Calcium 11/04/2019 10.1  8.4 - 10.5 mg/dL Final    Allergies as of 11/04/2019   No Known Allergies     Medication List       Accurate as of November 04, 2019 11:59 PM. If you have any questions, ask your nurse or doctor.        aspirin EC 81 MG tablet Take 81 mg by mouth daily.   atorvastatin 40 MG tablet Commonly known as: LIPITOR Take 20 mg by mouth daily.   B-12 1000 MCG Tabs Take 1,000 mcg by mouth daily.   Calcium Citrate+D3 Petites 200-250 MG-UNIT Tabs Generic drug: Calcium Citrate-Vitamin D Take 1 tablet by mouth  daily.   Centrum tablet Take 1 tablet by mouth daily.   diclofenac sodium 1 % Gel Commonly known as: VOLTAREN Apply 2 g topically at bedtime.   DULoxetine 60 MG capsule Commonly known as: CYMBALTA Take 60 mg by mouth daily.   enalapril 20 MG tablet Commonly known as: VASOTEC Take 20 mg by mouth daily.   glucose blood test strip Use as instructed to check 3 times daily   hydrochlorothiazide 25 MG tablet Commonly known as: HYDRODIURIL Take 25 mg by mouth every  morning.   labetalol 200 MG tablet Commonly known as: NORMODYNE Take 200 mg by mouth daily.   magnesium oxide 400 MG tablet Commonly known as: MAG-OX Take 400 mg by mouth daily.   Pepcid 40 MG tablet Generic drug: famotidine Take 40 mg by mouth daily. TAKE 1 TABLET BY MOUTH ONCE DAILY.   RA PROBIOTIC DIGESTIVE CARE PO Take 1 capsule by mouth daily.   Soliqua 100-33 UNT-MCG/ML Sopn Generic drug: Insulin Glargine-Lixisenatide INJECT 26 UNITS INTO THE SKIN DAILY BEFORE BREAKFast       Allergies: No Known Allergies  Past Medical History:  Diagnosis Date  . Chronic back pain   . Chronic diarrhea   . Diabetes ([place])   . GERD (gastroesophageal reflux disease)   . Hyperlipidemia   . Hypertension     Past Surgical History:  Procedure Laterality Date  . ABDOMINAL HYSTERECTOMY    . CARDIAC CATHETERIZATION    . CHOLECYSTECTOMY    . COLONOSCOPY  11/2015   Dr. Britta Mccreedy: hyperplastic polyp from sigmoid colon. biopsies from ascending colon were negative for microscopic colitis.   . COLONOSCOPY N/A 03/31/2018   Dr. Gala Romney: Diverticulosis, random colon biopsies negative, hemorrhoids.  No future screening or surveillance colonoscopy is recommended.  . TONSILLECTOMY    . TRIGGER FINGER RELEASE      Family History  Problem Relation Age of Onset  . Heart attack Sister   . Heart attack Mother   . Diabetes Mother   . Heart attack Father   . Diabetes Son   . Diabetes Maternal Aunt   . Colon cancer Neg Hx   . Celiac disease Neg Hx   . Inflammatory bowel disease Neg Hx     Social History:  reports that she has never smoked. She has never used smokeless tobacco. She reports that she does not drink alcohol or use drugs.   Review of Systems   Lipid history: Treated by PCP with 40 mg Lipitor long-term She has no side effects with this Last LDL done by PCP in 05/2019 and LDL was 35    Lab Results  Component Value Date   CHOL 117 11/04/2019   HDL 50.70 11/04/2019   LDLCALC 43  11/04/2019   TRIG 117.0 11/04/2019   CHOLHDL 2 11/04/2019           Hypertension: Has been present for several years treated by PCP, is on enalapril 20 mg, Normodyne and hydrochlorothiazide   BP Readings from Last 3 Encounters:  11/04/19 140/72  06/28/19 (!) 144/80  11/16/18 122/70    Most recent eye exam was in 9/19 but report not available, due for follow-up  Most recent foot exam: 2/20  Currently known complications of diabetes: Diabetic peripheral neuropathy  Has symptoms of numbness in feet and fingers  She previously had a TSH of 5.4 done in 01/2019 and has not been on any supplementation  Lab Results  Component Value Date   TSH 2.51 11/04/2019   TSH  2.42 11/16/2018   TSH 2.36 12/02/2017   FREET4 0.71 11/04/2019    Physical Examination:  BP 140/72   Pulse 80   Ht 5\' 3"  (1.6 m)   Wt 144 lb 6.4 oz (65.5 kg)   SpO2 97%   BMI 25.58 kg/m       ASSESSMENT:  Diabetes type 2, insulin requiring  See history of present illness for detailed discussion of current diabetes management, blood sugar patterns and problems identified  Her A1c is 6.8, was 7  She is doing very well on [place] as the only diabetes regimen, had been intolerant to Metformin Although she may have postprandial hyperglycemia which she does not monitor since her A1c is excellent likely has consistent control She is forgetting to check readings after meal Her husband was present in the office today and discussed that he can help with her management and remind her to check her sugars after meals  Can also do more regular exercise  HYPERLIPIDEMIA: She is on relatively high-dose Lipitor and will recheck her lipid panel along with liver functions   HYPERTENSION: On 3 drug regimen and blood pressure appears to be well controlled today  Previously abnormal TSH: We will recheck today  PLAN:    . Glucose monitoring: She will need to alternate checking blood sugars in the mornings with readings a  couple of hours after meals . Try to use exercise bike at least every other day  . No change in Angelica dosage of 26 units . Labs today  . Call if having blood sugars out of the target range, discussed fasting and postprandial targets   Follow-up in about 3 months      Patient Instructions  Check blood sugars on waking up 3-4 days a week  Also check blood sugars about 2 hours after meals and do this after different meals by rotation  Recommended blood sugar levels on waking up are 90-130 and about 2 hours after meal is 130-160  Please bring your blood sugar monitor to each visit, thank you        Elayne Snare 11/05/2019, 2:37 PM   Note: This office note was prepared with Dragon voice recognition system technology. Any transcriptional errors that result from this process are unintentional.  Addendum: TSH normal.  Creatinine 1.35, higher than usual.  Will defer management to PCP, may need to reduce dose of enalapril and check on OTC drug use

## 2019-11-08 DIAGNOSIS — N183 Chronic kidney disease, stage 3 unspecified: Secondary | ICD-10-CM | POA: Diagnosis not present

## 2019-11-08 DIAGNOSIS — E1165 Type 2 diabetes mellitus with hyperglycemia: Secondary | ICD-10-CM | POA: Diagnosis not present

## 2019-11-08 DIAGNOSIS — Z6825 Body mass index (BMI) 25.0-25.9, adult: Secondary | ICD-10-CM | POA: Diagnosis not present

## 2019-11-08 DIAGNOSIS — R413 Other amnesia: Secondary | ICD-10-CM | POA: Diagnosis not present

## 2019-11-08 DIAGNOSIS — F419 Anxiety disorder, unspecified: Secondary | ICD-10-CM | POA: Diagnosis not present

## 2019-11-08 DIAGNOSIS — E039 Hypothyroidism, unspecified: Secondary | ICD-10-CM | POA: Diagnosis not present

## 2019-12-08 ENCOUNTER — Other Ambulatory Visit: Payer: Self-pay | Admitting: Endocrinology

## 2019-12-12 ENCOUNTER — Other Ambulatory Visit: Payer: Self-pay | Admitting: Endocrinology

## 2019-12-19 DIAGNOSIS — H6123 Impacted cerumen, bilateral: Secondary | ICD-10-CM | POA: Diagnosis not present

## 2019-12-19 DIAGNOSIS — H903 Sensorineural hearing loss, bilateral: Secondary | ICD-10-CM | POA: Diagnosis not present

## 2019-12-22 DIAGNOSIS — M19011 Primary osteoarthritis, right shoulder: Secondary | ICD-10-CM | POA: Diagnosis not present

## 2019-12-22 DIAGNOSIS — M19012 Primary osteoarthritis, left shoulder: Secondary | ICD-10-CM | POA: Diagnosis not present

## 2019-12-22 DIAGNOSIS — Z6825 Body mass index (BMI) 25.0-25.9, adult: Secondary | ICD-10-CM | POA: Diagnosis not present

## 2020-02-17 DIAGNOSIS — I10 Essential (primary) hypertension: Secondary | ICD-10-CM | POA: Diagnosis not present

## 2020-02-17 DIAGNOSIS — E7801 Familial hypercholesterolemia: Secondary | ICD-10-CM | POA: Diagnosis not present

## 2020-02-17 DIAGNOSIS — K219 Gastro-esophageal reflux disease without esophagitis: Secondary | ICD-10-CM | POA: Diagnosis not present

## 2020-02-17 DIAGNOSIS — E039 Hypothyroidism, unspecified: Secondary | ICD-10-CM | POA: Diagnosis not present

## 2020-02-17 DIAGNOSIS — N183 Chronic kidney disease, stage 3 unspecified: Secondary | ICD-10-CM | POA: Diagnosis not present

## 2020-02-17 DIAGNOSIS — E7800 Pure hypercholesterolemia, unspecified: Secondary | ICD-10-CM | POA: Diagnosis not present

## 2020-02-17 DIAGNOSIS — E1165 Type 2 diabetes mellitus with hyperglycemia: Secondary | ICD-10-CM | POA: Diagnosis not present

## 2020-02-17 DIAGNOSIS — R5383 Other fatigue: Secondary | ICD-10-CM | POA: Diagnosis not present

## 2020-02-17 DIAGNOSIS — E1140 Type 2 diabetes mellitus with diabetic neuropathy, unspecified: Secondary | ICD-10-CM | POA: Diagnosis not present

## 2020-02-17 DIAGNOSIS — R413 Other amnesia: Secondary | ICD-10-CM | POA: Diagnosis not present

## 2020-02-18 DIAGNOSIS — Z6824 Body mass index (BMI) 24.0-24.9, adult: Secondary | ICD-10-CM | POA: Diagnosis not present

## 2020-02-18 DIAGNOSIS — E1165 Type 2 diabetes mellitus with hyperglycemia: Secondary | ICD-10-CM | POA: Diagnosis not present

## 2020-02-18 DIAGNOSIS — K529 Noninfective gastroenteritis and colitis, unspecified: Secondary | ICD-10-CM | POA: Diagnosis not present

## 2020-02-18 DIAGNOSIS — E1140 Type 2 diabetes mellitus with diabetic neuropathy, unspecified: Secondary | ICD-10-CM | POA: Diagnosis not present

## 2020-02-22 DIAGNOSIS — Z6824 Body mass index (BMI) 24.0-24.9, adult: Secondary | ICD-10-CM | POA: Diagnosis not present

## 2020-02-22 DIAGNOSIS — K529 Noninfective gastroenteritis and colitis, unspecified: Secondary | ICD-10-CM | POA: Diagnosis not present

## 2020-03-07 ENCOUNTER — Ambulatory Visit: Payer: Medicare PPO | Admitting: Endocrinology

## 2020-03-07 ENCOUNTER — Encounter: Payer: Self-pay | Admitting: Endocrinology

## 2020-03-07 ENCOUNTER — Other Ambulatory Visit: Payer: Self-pay

## 2020-03-07 VITALS — BP 140/80 | HR 73 | Ht 63.0 in | Wt 141.0 lb

## 2020-03-07 DIAGNOSIS — I10 Essential (primary) hypertension: Secondary | ICD-10-CM

## 2020-03-07 DIAGNOSIS — E1165 Type 2 diabetes mellitus with hyperglycemia: Secondary | ICD-10-CM

## 2020-03-07 DIAGNOSIS — Z794 Long term (current) use of insulin: Secondary | ICD-10-CM | POA: Diagnosis not present

## 2020-03-07 LAB — BASIC METABOLIC PANEL
BUN: 28 mg/dL — ABNORMAL HIGH (ref 6–23)
CO2: 28 mEq/L (ref 19–32)
Calcium: 9.7 mg/dL (ref 8.4–10.5)
Chloride: 105 mEq/L (ref 96–112)
Creatinine, Ser: 1.24 mg/dL — ABNORMAL HIGH (ref 0.40–1.20)
GFR: 41.4 mL/min — ABNORMAL LOW (ref >60.00)
Glucose, Bld: 84 mg/dL (ref 70–99)
Potassium: 4.6 mEq/L (ref 3.5–5.1)
Sodium: 139 mEq/L (ref 135–145)

## 2020-03-07 LAB — POCT GLYCOSYLATED HEMOGLOBIN (HGB A1C): Hemoglobin A1C: 6.9 % — AB (ref 4.0–5.6)

## 2020-03-07 NOTE — Progress Notes (Signed)
Patient ID: [name] [name], female   DOB: Nov 19, 1935, 84 y.o.   MRN: [id number]           Reason for Appointment: Follow-up for Type 2 Diabetes    Referring PCP: Rory Percy   History of Present Illness:          Date of diagnosis of type 2 diabetes mellitus: At age 63   approximately      Background history:  She is a poor historian and not clear when she was started on insulin, probably at least since 2018 She thinks she has been on metformin in the past but this was causing diarrhea She does not know what other medications she has taken No prior A1c records are available except from 11/18  Recent history:   A1c is about the same at 6.9 compared to 6.8  INSULIN regimen is: Soliqua 26 units in am      Non-insulin hypoglycemic drugs the patient is taking are: None  Current management, blood sugar patterns and problems identified:  She has variable blood sugars at home and again continues to check them only in the morning mostly  As before she tends to forget to take her [place] at the same time and may not take it in the morning before breakfast  She keeps her Soliqua in the refrigerator  However she has had 2 episodes of low blood sugars during the night in the last months with the lowest reading 41 accompanied by significant shakiness  She does not know why her blood sugars were dropping  More recently has been told to follow a low gluten diet by her PCP but she still has gluten-free bread with her bacon and eggs in the morning at breakfast  She has a couple of readings midday which are about the same as in the morning and once had a reading late afternoon which was 192  Also FASTING blood sugars have been higher than last 5 readings compared to 2 weeks ago when she was as low as 97  No nausea with [place]  Periodically may use her exercise bike       Side effects from medications have been: Diarrhea from metformin  :    Typical meal intake: Breakfast is   toast,  eggs and meat, lunch usually half sandwich with fruit and sweet tea, dinner meat, potato, salad and tea.  Mostly snacks are fruits              Exercise:   she is trying to get on her exercise bike, little walking limited by balance  Glucose monitoring:  Done 1 times a day         Glucometer:  Accu-Chek     Blood Glucose readings by download:   PRE-MEAL Fasting Lunch Dinner Bedtime Overall  Glucose range:  97-180  89, 159  1 92 ?   Mean/median:  710     626   Dietician visit, most recent: Few years ago  Weight history:  Wt Readings from Last 3 Encounters:  03/07/20 141 lb (64 kg)  11/04/19 144 lb 6.4 oz (65.5 kg)  06/28/19 143 lb 6.4 oz (65 kg)    Glycemic control: A1c in 11/19 was 8.3    Lab Results  Component Value Date   HGBA1C 6.9 (A) 03/07/2020   HGBA1C 6.8 (A) 11/04/2019   HGBA1C 7.0 (A) 06/28/2019   Lab Results  Component Value Date   MICROALBUR 28.8 (H) 06/28/2019   LDLCALC 43 11/04/2019  CREATININE 1.35 (H) 11/04/2019   Lab Results  Component Value Date   MICRALBCREAT 36.8 (H) 06/28/2019    No results found for: FRUCTOSAMINE  Office Visit on 03/07/2020  Component Date Value Ref Range Status  . Hemoglobin A1C 03/07/2020 6.9* 4.0 - 5.6 % Final    Allergies as of 03/07/2020   No Known Allergies     Medication List       Accurate as of March 07, 2020  3:02 PM. If you have any questions, ask your nurse or doctor.        Accu-Chek Guide test strip Generic drug: glucose blood USE AS DIRECTED THREE TIMES DAILY   aspirin EC 81 MG tablet Take 81 mg by mouth daily.   atorvastatin 40 MG tablet Commonly known as: LIPITOR Take 20 mg by mouth daily.   B-12 1000 MCG Tabs Take 1,000 mcg by mouth daily.   Calcium Citrate+D3 Petites 200-250 MG-UNIT Tabs Generic drug: Calcium Citrate-Vitamin D Take 1 tablet by mouth daily.   Centrum tablet Take 1 tablet by mouth daily.   diclofenac sodium 1 % Gel Commonly known as: VOLTAREN Apply 2 g topically at  bedtime.   DULoxetine 60 MG capsule Commonly known as: CYMBALTA Take 60 mg by mouth daily.   enalapril 20 MG tablet Commonly known as: VASOTEC Take 20 mg by mouth daily.   hydrochlorothiazide 25 MG tablet Commonly known as: HYDRODIURIL Take 25 mg by mouth every morning.   labetalol 200 MG tablet Commonly known as: NORMODYNE Take 200 mg by mouth daily.   magnesium oxide 400 MG tablet Commonly known as: MAG-OX Take 400 mg by mouth daily.   Pepcid 40 MG tablet Generic drug: famotidine Take 40 mg by mouth daily. TAKE 1 TABLET BY MOUTH ONCE DAILY.   RA PROBIOTIC DIGESTIVE CARE PO Take 1 capsule by mouth daily.   Soliqua 100-33 UNT-MCG/ML Sopn Generic drug: Insulin Glargine-Lixisenatide INJECT 26 UNITS INTO THE SKIN DAILY BEFORE BREAKFast       Allergies: No Known Allergies  Past Medical History:  Diagnosis Date  . Chronic back pain   . Chronic diarrhea   . Diabetes ([place])   . GERD (gastroesophageal reflux disease)   . Hyperlipidemia   . Hypertension     Past Surgical History:  Procedure Laterality Date  . ABDOMINAL HYSTERECTOMY    . CARDIAC CATHETERIZATION    . CHOLECYSTECTOMY    . COLONOSCOPY  11/2015   Dr. Britta Mccreedy: hyperplastic polyp from sigmoid colon. biopsies from ascending colon were negative for microscopic colitis.   . COLONOSCOPY N/A 03/31/2018   Dr. Gala Romney: Diverticulosis, random colon biopsies negative, hemorrhoids.  No future screening or surveillance colonoscopy is recommended.  . TONSILLECTOMY    . TRIGGER FINGER RELEASE      Family History  Problem Relation Age of Onset  . Heart attack Sister   . Heart attack Mother   . Diabetes Mother   . Heart attack Father   . Diabetes Son   . Diabetes Maternal Aunt   . Colon cancer Neg Hx   . Celiac disease Neg Hx   . Inflammatory bowel disease Neg Hx     Social History:  reports that she has never smoked. She has never used smokeless tobacco. She reports that she does not drink alcohol or use  drugs.   Review of Systems   Lipid history: Treated by PCP with 40 mg Lipitor long-term She has no side effects with this    Lab Results  Component  Value Date   CHOL 117 11/04/2019   HDL 50.70 11/04/2019   LDLCALC 43 11/04/2019   TRIG 117.0 11/04/2019   CHOLHDL 2 11/04/2019           Hypertension: Has been present for several years treated by PCP, is on enalapril 20 mg, Normodyne 200 mg and hydrochlorothiazide She does not know what her blood pressure has been elsewhere but it was high when she first came in   BP Readings from Last 3 Encounters:  03/07/20 140/80  11/04/19 140/72  06/28/19 (!) 144/80   She had a relatively higher creatinine on her last visit No results available from PCP office  Lab Results  Component Value Date   CREATININE 1.35 (H) 11/04/2019   CREATININE 1.04 06/28/2019   CREATININE 0.93 11/16/2018     Most recent eye exam was in 9/19 but report not available, due for follow-up  Most recent foot exam: 2/20  Currently known complications of diabetes: Diabetic peripheral neuropathy  Has symptoms of numbness in feet and fingers  She previously had a TSH of 5.4 done in 01/2019 but this has been subsequently normal  Lab Results  Component Value Date   TSH 2.51 11/04/2019   TSH 2.42 11/16/2018   TSH 2.36 12/02/2017   FREET4 0.71 11/04/2019    Physical Examination:  BP 140/80 (Patient Position: Sitting)   Pulse 73   Ht 5\' 3"  (1.6 m)   Wt 141 lb (64 kg)   SpO2 98%   BMI 24.98 kg/m       ASSESSMENT:  Diabetes type 2, insulin requiring  See history of present illness for detailed discussion of current diabetes management, blood sugar patterns and problems identified  Her A1c is stable at 6.9  She is doing fairly well on Soliqua as the only diabetes regimen, had been intolerant to Metformin However blood sugars are inconsistent Not clear why she had 2 episodes of low sugars during the night This could be from her taking her  Soliqua at various different times although not clear if she delayed her injection until afternoon and evening to cause a low sugar She is not checking her sugars in the evenings before or after dinner and not clear if the reading may be low at certain times Fasting readings are variable and recently higher but not related to missing any doses of her Soliqua  HYPERLIPIDEMIA: LDL is controlled with 40 mg Lipitor  Last liver function normal  Weight loss: Monitored by PCP, recently down only 3 pounds  HYPERTENSION: On 3 drug regimen and blood pressure appears to be slightly high today but she was rushing in    PLAN:    . Glucose monitoring: She will need to start checking blood sugars at least once a day in the afternoon or evening at various times . She will not need to check every morning . She will leave her Soliqua pen on her dining table so she can remember to take it before eating in the morning.  She can keep the unused pens in the refrigerator as before . She will call if she has any further hypoglycemia . Since her morning readings are mostly high she will go up at least 2 units on her [place] and discussed morning blood sugar targets . She will try to use the exercise bike daily . Consider consultation with dietitian   Follow-up in about 3 months      Patient Instructions  28 units every day, after Sunday if am  sugar is >140 go to 30 units  Check blood sugars on waking up 4 days a week  Also check blood sugars about 2 hours after meals and do this after different meals by rotation  Recommended blood sugar levels on waking up are 90-130 and about 2 hours after meal is 130-180  Please bring your blood sugar monitor to each visit, thank you        [name] [name] 03/07/2020, 3:02 PM   Note: This office note was prepared with Dragon voice recognition system technology. Any transcriptional errors that result from this process are unintentional.  Addendum: TSH normal.   Creatinine 1.35, higher than usual.  Will defer management to PCP, may need to reduce dose of enalapril and check on OTC drug use

## 2020-03-07 NOTE — Patient Instructions (Addendum)
28 units every day, after Sunday if am sugar is >140 go to 30 units  Check blood sugars on waking up 4 days a week  Also check blood sugars about 2 hours after meals and do this after different meals by rotation  Recommended blood sugar levels on waking up are 90-130 and about 2 hours after meal is 130-180  Please bring your blood sugar monitor to each visit, thank you

## 2020-03-08 NOTE — Progress Notes (Signed)
Please call to let patient know that the lab results are in a good range and no further action needed.  Labs have been faxed to PCP

## 2020-03-12 DIAGNOSIS — R05 Cough: Secondary | ICD-10-CM | POA: Diagnosis not present

## 2020-03-12 DIAGNOSIS — J069 Acute upper respiratory infection, unspecified: Secondary | ICD-10-CM | POA: Diagnosis not present

## 2020-03-12 DIAGNOSIS — Z20828 Contact with and (suspected) exposure to other viral communicable diseases: Secondary | ICD-10-CM | POA: Diagnosis not present

## 2020-03-13 ENCOUNTER — Encounter: Payer: Self-pay | Admitting: Nurse Practitioner

## 2020-03-15 ENCOUNTER — Other Ambulatory Visit: Payer: Self-pay | Admitting: Endocrinology

## 2020-03-19 DIAGNOSIS — J069 Acute upper respiratory infection, unspecified: Secondary | ICD-10-CM | POA: Diagnosis not present

## 2020-04-18 ENCOUNTER — Encounter: Payer: Self-pay | Admitting: Nurse Practitioner

## 2020-04-18 ENCOUNTER — Ambulatory Visit: Payer: Medicare PPO | Admitting: Nurse Practitioner

## 2020-04-18 ENCOUNTER — Other Ambulatory Visit (INDEPENDENT_AMBULATORY_CARE_PROVIDER_SITE_OTHER): Payer: Medicare PPO

## 2020-04-18 VITALS — BP 142/80 | HR 66 | Ht 62.5 in | Wt 141.0 lb

## 2020-04-18 DIAGNOSIS — A09 Infectious gastroenteritis and colitis, unspecified: Secondary | ICD-10-CM

## 2020-04-18 LAB — CBC WITH DIFFERENTIAL/PLATELET
Basophils Absolute: 0.1 10*3/uL (ref 0.0–0.1)
Basophils Relative: 1.2 % (ref 0.0–3.0)
Eosinophils Absolute: 0.4 10*3/uL (ref 0.0–0.7)
Eosinophils Relative: 5.1 % — ABNORMAL HIGH (ref 0.0–5.0)
HCT: 31.8 % — ABNORMAL LOW (ref 36.0–46.0)
Hemoglobin: 10.5 g/dL — ABNORMAL LOW (ref 12.0–15.0)
Lymphocytes Relative: 25.4 % (ref 12.0–46.0)
Lymphs Abs: 1.9 10*3/uL (ref 0.7–4.0)
MCHC: 33.1 g/dL (ref 30.0–36.0)
MCV: 86.5 fl (ref 78.0–100.0)
Monocytes Absolute: 0.8 10*3/uL (ref 0.1–1.0)
Monocytes Relative: 10.7 % (ref 3.0–12.0)
Neutro Abs: 4.4 10*3/uL (ref 1.4–7.7)
Neutrophils Relative %: 57.6 % (ref 43.0–77.0)
Platelets: 178 10*3/uL (ref 150.0–400.0)
RBC: 3.67 Mil/uL — ABNORMAL LOW (ref 3.87–5.11)
RDW: 14.7 % (ref 11.5–15.5)
WBC: 7.6 10*3/uL (ref 4.0–10.5)

## 2020-04-18 LAB — BASIC METABOLIC PANEL
BUN: 23 mg/dL (ref 6–23)
CO2: 30 mEq/L (ref 19–32)
Calcium: 10.3 mg/dL (ref 8.4–10.5)
Chloride: 104 mEq/L (ref 96–112)
Creatinine, Ser: 0.96 mg/dL (ref 0.40–1.20)
GFR: 55.61 mL/min — ABNORMAL LOW (ref >60.00)
Glucose, Bld: 77 mg/dL (ref 70–99)
Potassium: 4.4 mEq/L (ref 3.5–5.1)
Sodium: 139 mEq/L (ref 135–145)

## 2020-04-18 LAB — C-REACTIVE PROTEIN: CRP: <1 mg/dL (ref 0.5–20.0)

## 2020-04-18 LAB — IGA: IgA: 209 mg/dL (ref 68–378)

## 2020-04-18 NOTE — Progress Notes (Signed)
04/18/2020 [name] [name] [id number] 1936-04-06   CHIEF COMPLAINT: Diarrhea   HISTORY OF PRESENT ILLNESS:  [name] [name] is n 84 year old female with a past medical history of arthritis, chronic back pain, hypertension, DM II, anemia, GERD and colon polyps. Past cholecystectomy and hysterectomy. She presents today with complaints of having chronic diarrhea which started 2 years ago she has progressively worsened over the past 7 to 8 months. She describes having watery to thick mud like loose stools 3-4 times every other day. Often her first bowel movement will be somewhat solid, subsequent bowel movements are watery to mud-like. She sometimes soils herself as she is unable to get to the bathroom in time. No nighttime fecal incontinence. No rectal bleeding or melena. No upper or lower abdominal pain. She was prescribed an antibiotic 3 to 4 weeks ago without noticeable change in her bowel pattern. Her dairy intake is limited. No new medications. She was previously on Metformin which was discontinued due to her diarrhea.  She has been on Cymbalta for the past 6 years. She is taking magnesium oxide 400 mg daily but she is not sure why. She has lost 20 pounds over the past 1-1/2 years. She underwent a colonoscopy by Dr. Gala Romney 03/31/2018 which showed diverticulosis in the sigmoid colon. Biopsies were negative for microscopic colitis. Colonoscopy 11/28/2015 identified a hyperplastic polyp which was removed from the sigmoid colon, colon biopsies were negative for lymphocytic/collagenous colitis. She is taking Imodium 3-4 times as the other day as needed. She is taking a probiotic daily. She has a history of GERD which is well controlled with taking famotidine 40 mg once daily. She denies having any dysphagia, heartburn or stomach pain. Her husband and daughter are present.  Colonoscopy 03/31/2018 by Dr. Gala Romney:  - Diverticulosis in the sigmoid colon. Biopsied. - Internal hemorrhoids. - The examination was otherwise  normal on direct and retroflexion views. 1. Colon, biopsy, right/ascending - COLONIC MUCOSA WITH NO SIGNIFICANT PATHOLOGIC CHANGES. - NO MICROSCOPIC COLITIS, ACTIVE INFLAMMATION OR GRANULOMAS. 2. Colon, biopsy, descending/sigmoid - COLONIC MUCOSA WITH NO SIGNIFICANT PATHOLOGIC CHANGES. - NO MICROSCOPIC COLITIS, ACTIVE INFLAMMATION OR GRANULOMAS.  Colonoscopy 11/28/2015: Semi-pedunculated hyperplastic polyp ranging between 5 to 9 mm in size was found in the sigmoid colon The colon mucosa was otherwise normal Biopsies of the [facility] were negative for lymphocytic and collagenous colitis Retroflexed views revealed no abnormalities   Past Medical History:  Diagnosis Date  . Chronic back pain   . Chronic diarrhea   . Diabetes ([place])   . GERD (gastroesophageal reflux disease)   . Hyperlipidemia   . Hypertension    Past Surgical History:  Procedure Laterality Date  . ABDOMINAL HYSTERECTOMY    . CARDIAC CATHETERIZATION    . CHOLECYSTECTOMY    . COLONOSCOPY  11/2015   Dr. Britta Mccreedy: hyperplastic polyp from sigmoid colon. biopsies from ascending colon were negative for microscopic colitis.   . COLONOSCOPY N/A 03/31/2018   Dr. Gala Romney: Diverticulosis, random colon biopsies negative, hemorrhoids.  No future screening or surveillance colonoscopy is recommended.  . TONSILLECTOMY    . TRIGGER FINGER RELEASE      Social History: She is married. Retired. She has 1 son and 2 daughters. Nonsmoker. No alcohol. No drug use.   Family History: Mother DM, MI 13 . Father MI 11. Sister CVA and MI.  No Known Allergies   Outpatient Encounter Medications as of 04/18/2020  Medication Sig  . ACCU-CHEK GUIDE test strip USE AS DIRECTED THREE  TIMES DAILY  . aspirin EC 81 MG tablet Take 81 mg by mouth daily.  Marland Kitchen atorvastatin (LIPITOR) 40 MG tablet Take 20 mg by mouth daily.   . Calcium Citrate-Vitamin D (CALCIUM CITRATE+D3 PETITES) 200-250 MG-UNIT TABS Take 1 tablet by mouth daily.  . Cyanocobalamin (B-12) 1000 MCG  TABS Take 1,000 mcg by mouth daily.   . diclofenac sodium (VOLTAREN) 1 % GEL Apply 2 g topically at bedtime.  . DULoxetine (CYMBALTA) 60 MG capsule Take 60 mg by mouth daily.  . enalapril (VASOTEC) 20 MG tablet Take 20 mg by mouth daily.  . famotidine (PEPCID) 40 MG tablet Take 40 mg by mouth daily. TAKE 1 TABLET BY MOUTH ONCE DAILY.  . hydrochlorothiazide (HYDRODIURIL) 25 MG tablet Take 25 mg by mouth every morning.   . labetalol (NORMODYNE) 200 MG tablet Take 200 mg by mouth daily.   . Lactobacillus Rhamnosus, GG, (RA PROBIOTIC DIGESTIVE CARE PO) Take 1 capsule by mouth daily.  . magnesium oxide (MAG-OX) 400 MG tablet Take 400 mg by mouth daily.  . Multiple Vitamins-Minerals (CENTRUM) tablet Take 1 tablet by mouth daily.  Willeen Niece 100-33 UNT-MCG/ML SOPN INJECT 26 UNITS INTO THE SKIN DAILY BEFORE BREAKFast   No facility-administered encounter medications on file as of 04/18/2020.     REVIEW OF SYSTEMS:   Gen: + weight loss.  CV: Denies chest pain, palpitations or edema. Resp: Denies cough, shortness of breath of hemoptysis.  GI: See HPI. GU : Denies urinary burning, blood in urine, increased urinary frequency or incontinence. MS: Denies having any new joint pain, muscles aches or weakness. Derm: Denies rash, itchiness, skin lesions or unhealing ulcers. Psych: Denies depression, anxiety, memory loss or confusion. Heme: Denies bruising, bleeding. Neuro:  Denies headaches, dizziness or paresthesias. Endo:  Denies any problems with thyroid or adrenal function.    PHYSICAL EXAM: BP (!) 142/80 (BP Location: Left Arm, Patient Position: Sitting)   Pulse 66   Ht 5' 2.5" (1.588 m)   Wt 141 lb (64 kg)   SpO2 98%   BMI 25.38 kg/m  General: Well developed 84 year old female in no acute distress. Head: Normocephalic and atraumatic. Eyes:  Sclerae non-icteric, conjunctive pink. Ears: Normal auditory acuity. Mouth: Dentition intact. No ulcers or lesions.  Neck: Supple, no  lymphadenopathy or thyromegaly.  Lungs: Clear bilaterally to auscultation without wheezes, crackles or rhonchi. Heart: Regular rate and rhythm. No murmur, rub or gallop appreciated.  Abdomen: Soft, nontender, non distended. No masses. No hepatosplenomegaly. Normoactive bowel sounds x 4 quadrants.  Rectal: Deferred.  Musculoskeletal: Symmetrical with no gross deformities. Skin: Warm and dry. No rash or lesions on visible extremities. Extremities: No edema. Neurological: Alert oriented x 4, no focal deficits.  Psychological:  Alert and cooperative. Normal mood and affect.  ASSESSMENT AND PLAN:  75. 84 year old female with chronic diarrhea -GI pathogen panel to include C. difficile PCR is a patient has recently been on antibiotics (unlikely infectious diarrhea) -Fecal pancreatic elastase level -Florastor probiotic 1 p.o. twice daily -Imodium 1 tablet p.o. twice daily as needed -Consider stopping magnesium oxide may contribute to her diarrhea, defer to PCP -CBC, BMP, CRP, IgA, TTG  2. GERD, well controlled on Famotidine  3. History of a hyperplastic colon polyp. -No further colon cancer screening recommended due to age  43. Diabetes mellitus type 2, no longer on Metformin. She is now on [place].     CC:  Rory Percy, MD

## 2020-04-18 NOTE — Patient Instructions (Signed)
If you are age 82 or older, your body mass index should be between 23-30. Your Body mass index is 25.38 kg/m. If this is out of the aforementioned range listed, please consider follow up with your Primary Care Provider.  If you are age 18 or younger, your body mass index should be between 19-25. Your Body mass index is 25.38 kg/m. If this is out of the aformentioned range listed, please consider follow up with your Primary Care Provider.   Please purchase the following medications over the counter and take as directed:  1. Florastor 1 by mouth twice a day 2. Imodium 2 tablets twice a day 3. Call if your symptoms worsen.  Due to recent changes in healthcare laws, you may see the results of your imaging and laboratory studies on MyChart before your provider has had a chance to review them.  We understand that in some cases there may be results that are confusing or concerning to you. Not all laboratory results come back in the same time frame and the provider may be waiting for multiple results in order to interpret others.  Please give [place] 48 hours in order for your provider to thoroughly review all the results before contacting the office for clarification of your results.   Thank you for choosing [facility] Noralyn Pick, CRNP

## 2020-04-19 LAB — TISSUE TRANSGLUTAMINASE, IGA: (tTG) Ab, IgA: 1 U/mL

## 2020-04-19 NOTE — Progress Notes (Signed)
Reviewed and agree with management plans. ? ?Kayleen Alig L. Dalylah Ramey, MD, MPH  ?

## 2020-04-20 LAB — GI PROFILE, STOOL, PCR

## 2020-04-24 LAB — PANCREATIC ELASTASE, FECAL: Pancreatic Elastase-1, Stool: >500 mcg/g

## 2020-04-30 ENCOUNTER — Telehealth: Payer: Self-pay | Admitting: General Surgery

## 2020-04-30 NOTE — Telephone Encounter (Signed)
Notified the patient of normal stool test. She states things are better with her bowels, not resolved but better.

## 2020-04-30 NOTE — Telephone Encounter (Signed)
-----   Message from Noralyn Pick, NP sent at 04/29/2020 12:09 PM EDT ----- [name] [name], pls inform the patient her pancreatic elastase stool test was normal.  thx

## 2020-05-31 DIAGNOSIS — M19011 Primary osteoarthritis, right shoulder: Secondary | ICD-10-CM | POA: Diagnosis not present

## 2020-05-31 DIAGNOSIS — Z6825 Body mass index (BMI) 25.0-25.9, adult: Secondary | ICD-10-CM | POA: Diagnosis not present

## 2020-05-31 DIAGNOSIS — M19012 Primary osteoarthritis, left shoulder: Secondary | ICD-10-CM | POA: Diagnosis not present

## 2020-06-07 ENCOUNTER — Encounter: Payer: Self-pay | Admitting: Endocrinology

## 2020-06-07 ENCOUNTER — Other Ambulatory Visit: Payer: Self-pay

## 2020-06-07 ENCOUNTER — Ambulatory Visit: Payer: Medicare PPO | Admitting: Endocrinology

## 2020-06-07 VITALS — BP 134/82 | HR 73 | Ht 62.5 in | Wt 137.6 lb

## 2020-06-07 DIAGNOSIS — E1165 Type 2 diabetes mellitus with hyperglycemia: Secondary | ICD-10-CM | POA: Diagnosis not present

## 2020-06-07 DIAGNOSIS — Z794 Long term (current) use of insulin: Secondary | ICD-10-CM | POA: Diagnosis not present

## 2020-06-07 LAB — POCT GLYCOSYLATED HEMOGLOBIN (HGB A1C): Hemoglobin A1C: 6.8 % — AB (ref 4.0–5.6)

## 2020-06-07 NOTE — Patient Instructions (Addendum)
SOLIQUA 22 units in am   Check blood sugars on waking up 3-4 days a week  Also check blood sugars about 2 hours after meals and do this after different meals by rotation  Recommended blood sugar levels on waking up are 90-130 and about 2 hours after meal is 130-180  Please bring your blood sugar monitor to each visit, thank you

## 2020-06-07 NOTE — Progress Notes (Signed)
Patient ID: [name] [name], female   DOB: 04-19-1936, 84 y.o.   MRN: [id number]           Reason for Appointment: Follow-up for Type 2 Diabetes   Referring PCP: Rory Percy   History of Present Illness:          Date of diagnosis of type 2 diabetes mellitus: At age 62   approximately      Background history:  She is a poor historian and not clear when she was started on insulin, probably at least since 2018 She thinks she has been on metformin in the past but this was causing diarrhea She does not know what other medications she has taken No prior A1c records are available except from 11/18  Recent history:   A1c is about the same at 6.8  INSULIN regimen is: Soliqua 26 units in am      Non-insulin hypoglycemic drugs the patient is taking are: None  Current management, blood sugar patterns and problems identified:  She was advised to keep her [place] on her kitchen counter to make her remember to take it consistently before breakfast  She still does not know this and will occasionally may delay or miss her medication  As before she forgets to check her readings after meals and only in the morning  She has had higher readings in the last week after getting a steroid injection intra-articularly  Prior to this her blood sugars in the mornings were near normal RANGE 75-124 in the mornings and 158 midday  In the last week blood sugars have been as high as 218 fasting and 256 after breakfast  Today blood sugar 124 fasting  She is not motivated to exercise  Weight is down about 1 pound  Her family said that she has a decreased appetite  No recent hypoglycemia reported       Side effects from medications have been: Diarrhea from metformin  :    Typical meal intake: Breakfast is   toast, eggs and meat, lunch usually half sandwich with fruit and sweet tea, dinner meat, potato, salad and tea.  Mostly snacks are fruits              Exercise:   she is trying to get on her  exercise bike, little walking limited by balance  Glucose monitoring:  Done 1 times a day         Glucometer:  Accu-Chek     Blood Glucose readings by download as above:  AVERAGE blood sugar 141 for the last 2 weeks  Previous readings:  PRE-MEAL Fasting Lunch Dinner Bedtime Overall  Glucose range:  97-180  89, 159  1 92 ?   Mean/median:  226     333   Dietician visit, most recent: Few years ago  Weight history:  Wt Readings from Last 3 Encounters:  06/07/20 137 lb 9.6 oz (62.4 kg)  04/18/20 141 lb (64 kg)  03/07/20 141 lb (64 kg)    Glycemic control: A1c in 11/19 was 8.3    Lab Results  Component Value Date   HGBA1C 6.8 (A) 06/07/2020   HGBA1C 6.9 (A) 03/07/2020   HGBA1C 6.8 (A) 11/04/2019   Lab Results  Component Value Date   MICROALBUR 28.8 (H) 06/28/2019   LDLCALC 43 11/04/2019   CREATININE 0.96 04/18/2020   Lab Results  Component Value Date   MICRALBCREAT 36.8 (H) 06/28/2019    No results found for: FRUCTOSAMINE  Office Visit on 06/07/2020  Component Date Value Ref Range Status   Hemoglobin A1C 06/07/2020 6.8* 4.0 - 5.6 % Final    Allergies as of 06/07/2020   No Known Allergies     Medication List       Accurate as of June 07, 2020  1:09 PM. If you have any questions, ask your nurse or doctor.        Accu-Chek Guide test strip Generic drug: glucose blood USE AS DIRECTED THREE TIMES DAILY   aspirin EC 81 MG tablet Take 81 mg by mouth daily.   atorvastatin 40 MG tablet Commonly known as: LIPITOR Take 20 mg by mouth daily.   B-12 1000 MCG Tabs Take 1,000 mcg by mouth daily.   Calcium Citrate+D3 Petites 200-250 MG-UNIT Tabs Generic drug: Calcium Citrate-Vitamin D Take 1 tablet by mouth daily.   Centrum tablet Take 1 tablet by mouth daily.   diclofenac sodium 1 % Gel Commonly known as: VOLTAREN Apply 2 g topically at bedtime.   DULoxetine 60 MG capsule Commonly known as: CYMBALTA Take 60 mg by mouth daily.   enalapril 20 MG  tablet Commonly known as: VASOTEC Take 20 mg by mouth daily.   hydrochlorothiazide 25 MG tablet Commonly known as: HYDRODIURIL Take 25 mg by mouth every morning.   labetalol 200 MG tablet Commonly known as: NORMODYNE Take 200 mg by mouth daily.   magnesium oxide 400 MG tablet Commonly known as: MAG-OX Take 400 mg by mouth daily.   Pepcid 40 MG tablet Generic drug: famotidine Take 40 mg by mouth daily. TAKE 1 TABLET BY MOUTH ONCE DAILY.   RA PROBIOTIC DIGESTIVE CARE PO Take 1 capsule by mouth daily.   Soliqua 100-33 UNT-MCG/ML Sopn Generic drug: Insulin Glargine-Lixisenatide INJECT 26 UNITS INTO THE SKIN DAILY BEFORE BREAKFast       Allergies: No Known Allergies  Past Medical History:  Diagnosis Date   Chronic back pain    Chronic diarrhea    Diabetes ([place])    GERD (gastroesophageal reflux disease)    Hyperlipidemia    Hypertension     Past Surgical History:  Procedure Laterality Date   ABDOMINAL HYSTERECTOMY     CARDIAC CATHETERIZATION     CHOLECYSTECTOMY     COLONOSCOPY  11/2015   Dr. Britta Mccreedy: hyperplastic polyp from sigmoid colon. biopsies from ascending colon were negative for microscopic colitis.    COLONOSCOPY N/A 03/31/2018   Dr. Gala Romney: Diverticulosis, random colon biopsies negative, hemorrhoids.  No future screening or surveillance colonoscopy is recommended.   TONSILLECTOMY     TRIGGER FINGER RELEASE      Family History  Problem Relation Age of Onset   Heart attack Sister    Heart attack Mother    Diabetes Mother    Heart attack Father    Diabetes Son    Diabetes Maternal Aunt    Colon cancer Neg Hx    Celiac disease Neg Hx    Inflammatory bowel disease Neg Hx     Social History:  reports that she has never smoked. She has never used smokeless tobacco. She reports that she does not drink alcohol and does not use drugs.   Review of Systems   Lipid history: Treated by PCP with 40 mg Lipitor long-term She has no side  effects with this    Lab Results  Component Value Date   CHOL 117 11/04/2019   HDL 50.70 11/04/2019   LDLCALC 43 11/04/2019   TRIG 117.0 11/04/2019   CHOLHDL 2 11/04/2019  Hypertension: Has been present for several years treated by PCP, is on enalapril 20 mg, Normodyne 200 mg and hydrochlorothiazide    BP Readings from Last 3 Encounters:  06/07/20 134/82  04/18/20 (!) 142/80  03/07/20 140/80   She had a relatively higher creatinine in 2/21  Lab Results  Component Value Date   CREATININE 0.96 04/18/2020   CREATININE 1.24 (H) 03/07/2020   CREATININE 1.35 (H) 11/04/2019     Most recent eye exam was in 9/19 but report not available, due for follow-up  Most recent foot exam: 2/20  Currently known complications of diabetes: Diabetic peripheral neuropathy  Has symptoms of numbness in feet and fingers  She previously had a TSH of 5.4 done in 01/2019 but this has been subsequently normal  Lab Results  Component Value Date   TSH 2.51 11/04/2019   TSH 2.42 11/16/2018   TSH 2.36 12/02/2017   FREET4 0.71 11/04/2019    Physical Examination:  BP 134/82 (BP Location: Left Arm, Patient Position: Sitting, Cuff Size: Normal)    Pulse 73    Ht 5' 2.5" (1.588 m)    Wt 137 lb 9.6 oz (62.4 kg)    SpO2 99%    BMI 24.77 kg/m       ASSESSMENT:  Diabetes type 2, insulin requiring  See history of present illness for detailed discussion of current diabetes management, blood sugar patterns and problems identified  Her A1c is stable at 6.8  She is doing fairly well on Soliqua as the only diabetes regimen, had been intolerant to Metformin  For her is her level of control is excellent Her main complaint now is decreased appetite although she has not lost significant amount of weight and for her height she is at a good level of BMI No hypoglycemia with Willeen Niece also Prior to her steroid injection her blood sugars were excellent although checking in the morning again as  usual   PLAN:     Glucose monitoring: She was reminded to check some readings after supper or later in the day and not every morning  She will reduce her Soliqua down to 22 units to see if her appetite is better  Otherwise may need to consider separate doses of Victoza and [place]  She will try to remember to take her injection before breakfast daily  Continue follow-up with PCP for other problems   Follow-up in about 3 months      Patient Instructions  SOLIQUA 22 units in am   Check blood sugars on waking up 3-4 days a week  Also check blood sugars about 2 hours after meals and do this after different meals by rotation  Recommended blood sugar levels on waking up are 90-130 and about 2 hours after meal is 130-180  Please bring your blood sugar monitor to each visit, thank you        Elayne Snare 06/07/2020, 1:09 PM   Note: This office note was prepared with Dragon voice recognition system technology. Any transcriptional errors that result from this process are unintentional.

## 2020-06-19 DIAGNOSIS — K529 Noninfective gastroenteritis and colitis, unspecified: Secondary | ICD-10-CM | POA: Diagnosis not present

## 2020-06-19 DIAGNOSIS — Z23 Encounter for immunization: Secondary | ICD-10-CM | POA: Diagnosis not present

## 2020-06-19 DIAGNOSIS — E1140 Type 2 diabetes mellitus with diabetic neuropathy, unspecified: Secondary | ICD-10-CM | POA: Diagnosis not present

## 2020-06-19 DIAGNOSIS — Z6824 Body mass index (BMI) 24.0-24.9, adult: Secondary | ICD-10-CM | POA: Diagnosis not present

## 2020-06-19 DIAGNOSIS — M129 Arthropathy, unspecified: Secondary | ICD-10-CM | POA: Diagnosis not present

## 2020-06-19 DIAGNOSIS — F419 Anxiety disorder, unspecified: Secondary | ICD-10-CM | POA: Diagnosis not present

## 2020-07-04 ENCOUNTER — Ambulatory Visit: Payer: Medicare PPO | Admitting: Gastroenterology

## 2020-07-06 ENCOUNTER — Encounter: Payer: Self-pay | Admitting: *Deleted

## 2020-07-09 ENCOUNTER — Ambulatory Visit: Payer: Medicare PPO | Admitting: Gastroenterology

## 2020-07-09 ENCOUNTER — Other Ambulatory Visit (INDEPENDENT_AMBULATORY_CARE_PROVIDER_SITE_OTHER): Payer: Medicare PPO

## 2020-07-09 ENCOUNTER — Encounter: Payer: Self-pay | Admitting: Gastroenterology

## 2020-07-09 VITALS — BP 128/60 | HR 76 | Ht 62.5 in

## 2020-07-09 DIAGNOSIS — A09 Infectious gastroenteritis and colitis, unspecified: Secondary | ICD-10-CM

## 2020-07-09 DIAGNOSIS — R634 Abnormal weight loss: Secondary | ICD-10-CM

## 2020-07-09 DIAGNOSIS — D649 Anemia, unspecified: Secondary | ICD-10-CM

## 2020-07-09 LAB — IRON: Iron: 56 ug/dL (ref 42–145)

## 2020-07-09 LAB — FERRITIN: Ferritin: 47.5 ng/mL (ref 10.0–291.0)

## 2020-07-09 MED ORDER — COLESTIPOL HCL 1 G PO TABS: 1.0000 g | ORAL_TABLET | Freq: Two times a day (BID) | ORAL | 1 refills | Status: DC

## 2020-07-09 NOTE — Progress Notes (Signed)
Referring Provider: Rory Percy, MD Primary Care Physician:  [name] Percy, MD  Chief complaint:  Diarrhea   IMPRESSION:  Chronic diarrhea with associated abdominal pain Unintentional weight loss of 20 pounds over the last 18 months Normocytic anemia without overt bleeding Cholecystectomy in [name] 22  Chronic diarrhea x years without alarm features now with associated abdominal pain: Previously evaluation with including colonoscopy with normal colon biopsies x 2. Recent evaluation included normal/negative CRP, TTGA/IgA, pancreatic elastase, GI pathogen panel.  Will proceed with fecal calproctin, contrasted cross-sectional imaging, and a trial of colestipol.  Will plan EGD if CT scan is negative.   Normocytic anemia: Iron and ferritin labs today to evaluate [name] anemia. EGD with duodenal biopsies recommended.    PLAN: Trial of colestipol 1 g BID, using QAM for the first week Fecal calprotectin Iron, ferritin CT abd/pelvis with constast EGD with biopsies if CT scan is negative  Please see the "Patient Instructions" section for addition details about the plan.  HPI: [name] [name] is a 84 y.o. female who returns in scheduled follow-up.  She has chronic diarrhea.  [name] [name] accompanies [name] to this appointment and notes that she doesn't eat well.  Was seen by [name] [name] and [name] [name] in the past having colonoscopies with normal random colon biopsies in 2017 and 2019.  Seen in consultation by [name] [name] 04/18/2020.  At that time she reported a 2-year history of chronic diarrhea that had worsened over the prior 7 to 8 months.  She described watery to thick mud like loose stools 3-4 times every other day. Often [name] first bowel movement will be somewhat solid, subsequent bowel movements are watery to mud-like. She sometimes soils herself as she is unable to get to the bathroom in time. No nighttime fecal incontinence. No rectal bleeding or melena. No upper or lower abdominal pain. [name]  dairy intake is limited. No new medications. She was previously on Metformin which was discontinued due to [name] diarrhea.  She has been on Cymbalta for the past 6 years. She is taking magnesium oxide 400 mg daily but she is not sure why. She has lost 20 pounds over the past 1-1/2 years.  Evaluation included: TSH normal 11/04/2019 Normal TTG a, IgA, CRP, pancreatic elastase, and GI pathogen panel in July 2021 CBC with hemoglobin of 10.5, MCV 86.5, RDW 178 03/2020  Returns today in scheduled follow-up.  Today she reports ongoing diarrhea with associated abdominal pain. Occurring at least weekly, although in the past was as frequent as every other day.  No blood or mucous. No nocturnal symptoms. Occasionally has constipation preceding the diarrhea. No identified food triggers including no symptoms related to mammalian products.   Follows a diabetic diet. Drinks Diet Coke daily. No other sugar substitutes.  [name] [name] is concerned that [name] poor diet and lack of activity contribute.   Medications tried: Uses Imodium to leave the house. No improvement with various probiotics.   Reports a long-standing history of anemia. No overt bleeding. No prior formal evaluation. No associated symptoms with anemia.   Prior endoscopic history: Colonoscopy with [name] [name] 11/28/2015 revealed a sigmoid colon semipedunculated hyperplastic polyp.  Random colon biopsies were normal.  There was no evidence of lymphocytic or collagenous colitis  Colonoscopy with [name] [name] in 2019 revealed diverticulosis and internal hemorrhoids.  Biopsies from the right and left colon were normal.  No known family history of colon cancer or polyps. No family history of uterine/endometrial cancer, pancreatic cancer or gastric/stomach cancer.  Past Medical History:  Diagnosis Date  . Chronic back pain   . Chronic diarrhea   . Diabetes ([place])   . Diverticulosis   . GERD (gastroesophageal reflux disease)   . Hyperlipidemia   .  Hypertension   . Internal hemorrhoids     Past Surgical History:  Procedure Laterality Date  . ABDOMINAL HYSTERECTOMY    . CARDIAC CATHETERIZATION    . CHOLECYSTECTOMY    . COLONOSCOPY  11/2015   [name] [name]: hyperplastic polyp from sigmoid colon. biopsies from ascending colon were negative for microscopic colitis.   . COLONOSCOPY N/A 03/31/2018   [name] [name]: Diverticulosis, random colon biopsies negative, hemorrhoids.  No future screening or surveillance colonoscopy is recommended.  . TONSILLECTOMY    . TRIGGER FINGER RELEASE      Current Outpatient Medications  Medication Sig Dispense Refill  . ACCU-CHEK GUIDE test strip USE AS DIRECTED THREE TIMES DAILY 150 strip 3  . aspirin EC 81 MG tablet Take 81 mg by mouth daily.    Marland Kitchen atorvastatin (LIPITOR) 40 MG tablet Take 20 mg by mouth daily.     . Calcium Citrate-Vitamin D (CALCIUM CITRATE+D3 PETITES) 200-250 MG-UNIT TABS Take 1 tablet by mouth daily.    . Cyanocobalamin (B-12) 1000 MCG TABS Take 1,000 mcg by mouth daily.     . diclofenac sodium (VOLTAREN) 1 % GEL Apply 2 g topically at bedtime.    . DULoxetine (CYMBALTA) 60 MG capsule Take 60 mg by mouth daily.    . enalapril (VASOTEC) 20 MG tablet Take 20 mg by mouth daily.    . famotidine (PEPCID) 40 MG tablet Take 40 mg by mouth daily. TAKE 1 TABLET BY MOUTH ONCE DAILY.    . hydrochlorothiazide (HYDRODIURIL) 25 MG tablet Take 25 mg by mouth every morning.     . labetalol (NORMODYNE) 200 MG tablet Take 200 mg by mouth daily.     . Lactobacillus Rhamnosus, GG, (RA PROBIOTIC DIGESTIVE CARE PO) Take 1 capsule by mouth daily.    . magnesium oxide (MAG-OX) 400 MG tablet Take 400 mg by mouth daily.    . Multiple Vitamins-Minerals (CENTRUM) tablet Take 1 tablet by mouth daily.    Willeen Niece 100-33 UNT-MCG/ML SOPN INJECT 26 UNITS INTO THE SKIN DAILY BEFORE BREAKFast 15 mL 1   No current facility-administered medications for this visit.    Allergies as of 07/09/2020  . (No Known Allergies)     Family History  Problem Relation Age of Onset  . Heart attack Sister   . Heart attack Mother   . Diabetes Mother   . Heart attack Father   . Diabetes Son   . Diabetes Maternal Aunt   . Colon cancer Neg Hx   . Celiac disease Neg Hx   . Inflammatory bowel disease Neg Hx     Social History   Socioeconomic History  . Marital status: Married    Spouse name: Not on file  . Number of children: Not on file  . Years of education: Not on file  . Highest education level: Not on file  Occupational History  . Not on file  Tobacco Use  . Smoking status: Never Smoker  . Smokeless tobacco: Never Used  Vaping Use  . Vaping Use: Never used  Substance and Sexual Activity  . Alcohol use: No    Alcohol/week: 0.0 standard drinks  . Drug use: No  . Sexual activity: Not on file  Other Topics Concern  . Not on file  Social  History Narrative  . Not on file   Social Determinants of Health   Financial Resource Strain:   . Difficulty of Paying Living Expenses: Not on file  Food Insecurity:   . Worried About Charity fundraiser in the Last Year: Not on file  . Ran Out of Food in the Last Year: Not on file  Transportation Needs:   . Lack of Transportation (Medical): Not on file  . Lack of Transportation (Non-Medical): Not on file  Physical Activity:   . Days of Exercise per Week: Not on file  . Minutes of Exercise per Session: Not on file  Stress:   . Feeling of Stress : Not on file  Social Connections:   . Frequency of Communication with Friends and Family: Not on file  . Frequency of Social Gatherings with Friends and Family: Not on file  . Attends Religious Services: Not on file  . Active Member of Clubs or Organizations: Not on file  . Attends Archivist Meetings: Not on file  . Marital Status: Not on file  Intimate Partner Violence:   . Fear of Current or Ex-Partner: Not on file  . Emotionally Abused: Not on file  . Physically Abused: Not on file  . Sexually  Abused: Not on file     Physical Exam: General:   Alert,  well-nourished, pleasant and cooperative in NAD Head:  Normocephalic and atraumatic. Eyes:  Sclera clear, no icterus.   Conjunctiva pink. Ears:  Normal auditory acuity. Nose:  No deformity, discharge,  or lesions. Mouth:  No deformity or lesions.   Neck:  Supple; no masses or thyromegaly. Lungs:  Clear throughout to auscultation.   No wheezes. Heart:  Regular rate and rhythm; no murmurs. Abdomen:  Soft, nontender, nondistended, normal bowel sounds, no rebound or guarding. No hepatosplenomegaly.   Msk:  Symmetrical. No boney deformities LAD: No inguinal or umbilical LAD Extremities:  No clubbing or edema. Neurologic:  Alert and  oriented x4;  grossly nonfocal Skin:  Intact without significant lesions or rashes. Psych:  Alert and cooperative. Normal mood and affect.     Marlyce Mcdougald L. Tarri Glenn, MD, MPH 07/09/2020, 10:45 AM

## 2020-07-09 NOTE — Patient Instructions (Addendum)
We have sent the following medications to your pharmacy for you to pick up at your convenience: Colestipol 1 gram every morning x 1 week, then increasing to twice daily thereafter. ____________________________________________________________ Your provider has requested that you go to the basement level for lab work before leaving today. Press "B" on the elevator. The lab is located at the first door on the left as you exit the elevator. ____________________________________________________________ Please go to the [facility] Radiology department today after leaving to pick up your 2 bottles of contrast for your upcoming CT scan. ____________________________________________________________  You have been scheduled for a CT scan of the abdomen and pelvis at [facility] Radiology (1st floor of the hospital)  You are scheduled on 07/18/20 at 1:30 pm. You should arrive 15 minutes prior to your appointment time for registration. Please follow the written instructions below on the day of your exam:  WARNING: IF YOU ARE ALLERGIC TO IODINE/X-RAY DYE, PLEASE NOTIFY RADIOLOGY IMMEDIATELY AT [phone]! YOU WILL BE GIVEN A 13 HOUR PREMEDICATION PREP.  1) Do not eat or drink anything after 9:30 am (4 hours prior to your test) 2) You have been given 2 bottles of oral contrast to drink. The solution may taste better if refrigerated, but do NOT add ice or any other liquid to this solution. Shake well before drinking.    Drink 1 bottle of contrast @ 11:30 am (2 hours prior to your exam)  Drink 1 bottle of contrast @ 12:30 pm (1 hour prior to your exam)  You may take any medications as prescribed with a small amount of water, if necessary. If you take any of the following medications: METFORMIN, GLUCOPHAGE, GLUCOVANCE, AVANDAMET, RIOMET, FORTAMET, Rea MET, JANUMET, GLUMETZA or METAGLIP, you MAY be asked to HOLD this medication 48 hours AFTER the exam.  The purpose of you drinking the oral contrast is to  aid in the visualization of your intestinal tract. The contrast solution may cause some diarrhea. Depending on your individual set of symptoms, you may also receive an intravenous injection of x-ray contrast/dye. Plan on being at [facility] for 30 minutes or longer, depending on the type of exam you are having performed.  This test typically takes 30-45 minutes to complete.  If you have any questions regarding your exam or if you need to reschedule, you may call the CT department at [phone] between the hours of 8:00 am and 5:00 pm, Monday-Friday.  ________________________________________________________________  If you are age 82 or older, your body mass index should be between 23-30. Your Body mass index is 24.77 kg/m. If this is out of the aforementioned range listed, please consider follow up with your Primary Care Provider. _______________________________________________________________  Due to recent changes in healthcare laws, you may see the results of your imaging and laboratory studies on MyChart before your provider has had a chance to review them.  We understand that in some cases there may be results that are confusing or concerning to you. Not all laboratory results come back in the same time frame and the provider may be waiting for multiple results in order to interpret others.  Please give [place] 48 hours in order for your provider to thoroughly review all the results before contacting the office for clarification of your results.

## 2020-07-10 ENCOUNTER — Telehealth: Payer: Self-pay

## 2020-07-10 NOTE — Telephone Encounter (Signed)
Lm for patient to please call back. Trying to give lab results

## 2020-07-10 NOTE — Telephone Encounter (Signed)
-----   Message from Thornton Park, MD sent at 07/10/2020  6:21 AM EDT ----- Please let the patient know that iron and ferritin levels were normal. I have no new recommendations at this time. Thank you.

## 2020-07-17 ENCOUNTER — Other Ambulatory Visit: Payer: Self-pay | Admitting: Endocrinology

## 2020-07-18 ENCOUNTER — Ambulatory Visit (HOSPITAL_COMMUNITY)
Admission: RE | Admit: 2020-07-18 | Discharge: 2020-07-18 | Disposition: A | Discharge status: Home / Self Care | Payer: Medicare PPO | Source: Ambulatory Visit | Attending: Gastroenterology | Admitting: Gastroenterology

## 2020-07-18 ENCOUNTER — Other Ambulatory Visit: Payer: Self-pay

## 2020-07-18 ENCOUNTER — Encounter (HOSPITAL_COMMUNITY): Payer: Self-pay

## 2020-07-18 DIAGNOSIS — D649 Anemia, unspecified: Secondary | ICD-10-CM

## 2020-07-18 DIAGNOSIS — R634 Abnormal weight loss: Secondary | ICD-10-CM | POA: Diagnosis not present

## 2020-07-18 DIAGNOSIS — A09 Infectious gastroenteritis and colitis, unspecified: Secondary | ICD-10-CM

## 2020-07-18 DIAGNOSIS — R109 Unspecified abdominal pain: Secondary | ICD-10-CM | POA: Diagnosis not present

## 2020-07-18 LAB — POCT I-STAT CREATININE: Creatinine, Ser: 1.3 mg/dL — ABNORMAL HIGH (ref 0.44–1.00)

## 2020-07-18 MED ORDER — IOHEXOL 300 MG/ML  SOLN
80.0000 mL | Freq: Once | INTRAMUSCULAR | Status: AC | PRN
  Administered 2020-07-18: 80 mL via INTRAVENOUS

## 2020-07-23 ENCOUNTER — Telehealth: Payer: Self-pay | Admitting: Gastroenterology

## 2020-07-23 NOTE — Telephone Encounter (Signed)
See result note.  

## 2020-07-23 NOTE — Telephone Encounter (Signed)
Pt's son Darnell Level called requesting to speak with somebody about pt's imaging results. He stated that due to his parents health status it is difficult for them to remember results. Bruce is not in pt's HIPAA but his sister [name] is so he would like [place] to call her at [phone] to speak about results.

## 2020-07-27 ENCOUNTER — Encounter: Payer: Self-pay | Admitting: Gastroenterology

## 2020-07-27 ENCOUNTER — Other Ambulatory Visit: Payer: Self-pay

## 2020-07-27 ENCOUNTER — Ambulatory Visit (AMBULATORY_SURGERY_CENTER): Payer: Self-pay | Admitting: *Deleted

## 2020-07-27 VITALS — Ht 62.5 in | Wt 142.0 lb

## 2020-07-27 DIAGNOSIS — A09 Infectious gastroenteritis and colitis, unspecified: Secondary | ICD-10-CM

## 2020-07-27 DIAGNOSIS — R634 Abnormal weight loss: Secondary | ICD-10-CM

## 2020-07-27 NOTE — Progress Notes (Signed)
Completed covid vaccines 11-25-19  Husband and daughter with pt at [facility]  Pt is aware that care partner will wait in the car during procedure; if they feel like they will be too hot or cold to wait in the car; they may wait in the 4 th floor lobby. Patient is aware to bring only one care partner. We want them to wear a mask (we do not have any that we can provide them), practice social distancing, and we will check their temperatures when they get here.  I did remind the patient that their care partner needs to stay in the parking lot the entire time and have a cell phone available, we will call them when the pt is ready for discharge. Patient will wear mask into building.   No trouble with anesthesia, difficulty with intubation or hx/fam hx of malignant hyperthermia per pt   No egg or soy allergy  No home oxygen use   No medications for weight loss taken

## 2020-08-01 ENCOUNTER — Encounter: Payer: Self-pay | Admitting: Gastroenterology

## 2020-08-01 ENCOUNTER — Ambulatory Visit (AMBULATORY_SURGERY_CENTER): Payer: Medicare PPO | Admitting: Gastroenterology

## 2020-08-01 ENCOUNTER — Other Ambulatory Visit: Payer: Self-pay

## 2020-08-01 VITALS — BP 148/74 | HR 88 | Temp 97.3°F | Resp 20 | Ht 62.0 in | Wt 142.0 lb

## 2020-08-01 DIAGNOSIS — R933 Abnormal findings on diagnostic imaging of other parts of digestive tract: Secondary | ICD-10-CM | POA: Diagnosis not present

## 2020-08-01 DIAGNOSIS — K2950 Unspecified chronic gastritis without bleeding: Secondary | ICD-10-CM | POA: Diagnosis not present

## 2020-08-01 DIAGNOSIS — A09 Infectious gastroenteritis and colitis, unspecified: Secondary | ICD-10-CM

## 2020-08-01 DIAGNOSIS — K319 Disease of stomach and duodenum, unspecified: Secondary | ICD-10-CM | POA: Diagnosis not present

## 2020-08-01 DIAGNOSIS — R197 Diarrhea, unspecified: Secondary | ICD-10-CM | POA: Diagnosis not present

## 2020-08-01 DIAGNOSIS — K2970 Gastritis, unspecified, without bleeding: Secondary | ICD-10-CM

## 2020-08-01 DIAGNOSIS — K225 Diverticulum of esophagus, acquired: Secondary | ICD-10-CM | POA: Diagnosis not present

## 2020-08-01 MED ORDER — SODIUM CHLORIDE 0.9 % IV SOLN: 500.0000 mL | Freq: Once | INTRAVENOUS | Status: DC

## 2020-08-01 NOTE — Op Note (Signed)
[place] Patient Name: [name] [name] Procedure Date: 08/01/2020 3:27 PM MRN: [id number] Endoscopist: Thornton Park MD, MD Age: 84 Referring MD:  Date of Birth: 1935/10/22 Gender: Female Account #: [id number] Procedure:                Upper GI endoscopy Indications:              Abdominal pain, Abnormal CT of the GI tract, Weight                            loss                           CT scan showed prominent soft tissue at the                            posterior aspect of the upper to mid stomach Medicines:                Monitored Anesthesia Care Procedure:                Pre-Anesthesia Assessment:                           - Prior to the procedure, a History and Physical                            was performed, and patient medications and                            allergies were reviewed. The patient's tolerance of                            previous anesthesia was also reviewed. The risks                            and benefits of the procedure and the sedation                            options and risks were discussed with the patient.                            All questions were answered, and informed consent                            was obtained. Prior Anticoagulants: The patient has                            taken no previous anticoagulant or antiplatelet                            agents. ASA Grade Assessment: II - A patient with                            mild systemic disease. After reviewing the risks  and benefits, the patient was deemed in                            satisfactory condition to undergo the procedure.                           After obtaining informed consent, the endoscope was                            passed under direct vision. Throughout the                            procedure, the patient's blood pressure, pulse, and                            oxygen saturations were monitored continuously. The                             Endoscope was introduced through the mouth, and                            advanced to the third part of duodenum. The upper                            GI endoscopy was accomplished without difficulty.                            The patient tolerated the procedure well. Scope In: Scope Out: Findings:                 The examined esophagus was normal.                           No mass or thickened gastric folds seen. Diffuse                            mild inflammation characterized by erythema,                            friability and granularity was found in the gastric                            body and in the gastric antrum. Biopsies were taken                            from the antrum, body, and fundus with a cold                            forceps for histology. Estimated blood loss was                            minimal.                           A large non-bleeding  diverticulum was found in the                            second portion of the duodenum. The duodenal mucosa                            otherwise appeared normal. Complications:            No immediate complications. Estimated blood loss:                            Minimal. Estimated Blood Loss:     Estimated blood loss was minimal. Impression:               - Normal esophagus.                           - Gastritis. Biopsied.                           - No gastric findings to explain the recent CT scan                            results. Perhaps those findings were due to                            retained food at the time of CT?                           - Large diverticulum in the duodenum. Otherwise,                            normal examined duodenum. Recommendation:           - Patient has a contact number available for                            emergencies. The signs and symptoms of potential                            delayed complications were discussed with the                             patient. Return to normal activities tomorrow.                            Written discharge instructions were provided to the                            patient.                           - Resume previous diet.                           - Continue present medications.                           -  Await pathology results.                           - If biopsies are negative, consider empiric                            treatment for SIBO given the large duodenal                            diverticulum Thornton Park MD, MD 08/01/2020 4:09:08 PM This report has been signed electronically.

## 2020-08-01 NOTE — Progress Notes (Signed)
Called to room to assist during endoscopic procedure.  Patient ID and intended procedure confirmed with present staff. Received instructions for my participation in the procedure from the performing physician.  

## 2020-08-01 NOTE — Progress Notes (Signed)
PT taken to PACU. Monitors in place. VSS. Report given to RN. 

## 2020-08-01 NOTE — Patient Instructions (Signed)
Handout on gastritis given to you today  Await pathology results    YOU HAD AN ENDOSCOPIC PROCEDURE TODAY AT THE [facility]:   Refer to the procedure report that was given to you for any specific questions about what was found during the examination.  If the procedure report does not answer your questions, please call your gastroenterologist to clarify.  If you requested that your care partner not be given the details of your procedure findings, then the procedure report has been included in a sealed envelope for you to review at your convenience later.  YOU SHOULD EXPECT: Some feelings of bloating in the abdomen. Passage of more gas than usual.  Walking can help get rid of the air that was put into your GI tract during the procedure and reduce the bloating. If you had a lower endoscopy (such as a colonoscopy or flexible sigmoidoscopy) you may notice spotting of blood in your stool or on the toilet paper. If you underwent a bowel prep for your procedure, you may not have a normal bowel movement for a few days.  Please Note:  You might notice some irritation and congestion in your nose or some drainage.  This is from the oxygen used during your procedure.  There is no need for concern and it should clear up in a day or so.  SYMPTOMS TO REPORT IMMEDIATELY:    Following upper endoscopy (EGD)  Vomiting of blood or coffee ground material  New chest pain or pain under the shoulder blades  Painful or persistently difficult swallowing  New shortness of breath  Fever of 100F or higher  Black, tarry-looking stools  For urgent or emergent issues, a gastroenterologist can be reached at any hour by calling [phone]. Do not use MyChart messaging for urgent concerns.    DIET:  We do recommend a small meal at first, but then you may proceed to your regular diet.  Drink plenty of fluids but you should avoid alcoholic beverages for 24 hours.  ACTIVITY:  You should plan to take it easy  for the rest of today and you should NOT DRIVE or use heavy machinery until tomorrow (because of the sedation medicines used during the test).    FOLLOW UP: Our staff will call the number listed on your records 48-72 hours following your procedure to check on you and address any questions or concerns that you may have regarding the information given to you following your procedure. If we do not reach you, we will leave a message.  We will attempt to reach you two times.  During this call, we will ask if you have developed any symptoms of COVID 19. If you develop any symptoms (ie: fever, flu-like symptoms, shortness of breath, cough etc.) before then, please call [phone].  If you test positive for Covid 19 in the 2 weeks post procedure, please call and report this information to us.    If any biopsies were taken you will be contacted by phone or by letter within the next 1-3 weeks.  Please call us at [phone] if you have not heard about the biopsies in 3 weeks.    SIGNATURES/CONFIDENTIALITY: You and/or your care partner have signed paperwork which will be entered into your electronic medical record.  These signatures attest to the fact that that the information above on your After Visit Summary has been reviewed and is understood.  Full responsibility of the confidentiality of this discharge information lies with you and/or your   care-partner. 

## 2020-08-03 ENCOUNTER — Telehealth: Payer: Self-pay | Admitting: *Deleted

## 2020-08-03 NOTE — Telephone Encounter (Signed)
  Follow up Call-  Call back number 08/01/2020  Post procedure Call Back phone  # [phone]  Permission to leave phone message Yes  Some recent data might be hidden     Patient questions:  Do you have a fever, pain , or abdominal swelling? No. Pain Score  0 *  Have you tolerated food without any problems? Yes.    Have you been able to return to your normal activities? Yes.    Do you have any questions about your discharge instructions: Diet   No. Medications  No. Follow up visit  No.  Do you have questions or concerns about your Care? No.  Actions: * If pain score is 4 or above: No action needed, pain <4.  1. Have you developed a fever since your procedure? no  2.   Have you had an respiratory symptoms (SOB or cough) since your procedure? no  3.   Have you tested positive for COVID 19 since your procedure no  4.   Have you had any family members/close contacts diagnosed with the COVID 19 since your procedure?  no   If yes to any of these questions please route to Joylene John, RN and Joella Prince, RN

## 2020-08-09 ENCOUNTER — Other Ambulatory Visit: Payer: Self-pay

## 2020-08-09 DIAGNOSIS — K862 Cyst of pancreas: Secondary | ICD-10-CM

## 2020-08-09 MED ORDER — RIFAXIMIN 550 MG PO TABS: 550.0000 mg | ORAL_TABLET | Freq: Three times a day (TID) | ORAL | 0 refills | Status: DC

## 2020-08-13 ENCOUNTER — Other Ambulatory Visit: Payer: Self-pay

## 2020-08-13 ENCOUNTER — Telehealth: Payer: Self-pay | Admitting: Gastroenterology

## 2020-08-13 MED ORDER — RIFAXIMIN 550 MG PO TABS
550.0000 mg | ORAL_TABLET | Freq: Three times a day (TID) | ORAL | 0 refills | Status: DC
Start: 2020-08-13 — End: 2020-09-11

## 2020-08-13 NOTE — Telephone Encounter (Signed)
Pt states the medication Xifaxan was sent to the wrong pharmacy, the correct pharmacy is [place], [place]

## 2020-08-13 NOTE — Telephone Encounter (Signed)
Prescription sent to [facility] drug per pt request.

## 2020-08-14 ENCOUNTER — Other Ambulatory Visit: Payer: Self-pay | Admitting: Endocrinology

## 2020-08-14 ENCOUNTER — Telehealth: Payer: Self-pay

## 2020-08-14 DIAGNOSIS — H35031 Hypertensive retinopathy, right eye: Secondary | ICD-10-CM | POA: Diagnosis not present

## 2020-08-14 LAB — HM DIABETES EYE EXAM

## 2020-08-14 NOTE — Telephone Encounter (Signed)
PRIOR AUTHORIZATION  PA initiation date: 08/14/20  Medication: [place] completed electronically through [employer] My Meds: Yes  Will await insurance response re: approval/denial.  [name] [name] (Key: [id number])  Your information has been sent to [facility].  [name] [name] (Key: [id number])  Your information has been submitted to [facility]. [employer] will review the request and will issue a decision, typically within 3-7 days from your submission. You can check the updated outcome later by reopening this request.  If [employer] has not responded in 3-7 days or if you have any questions about your ePA request, please contact [employer] at [phone]. If you think there may be a problem with your PA request, use our live chat feature at the bottom right.  For [place] requests, please call [phone].

## 2020-08-14 NOTE — Telephone Encounter (Signed)
APPROVAL  Medication: [employer]: [employer] PA response: Approved Approval dates: 08/14/20 through 11/12/20  Lenora Boys (Key: [id number])  This request has been approved.  Please note any additional information provided by [facility] at the bottom of your screen.  Emelly Dileo (Key: [id number]) Rx #: [id number] Xifaxan 550MG  tablets   Form [employer] Electronic PA Form Created 23 hours ago Sent to Plan 38 minutes ago Plan Response 38 minutes ago Submit Clinical Questions 27 minutes ago Determination Favorable 21 minutes ago Message from Plan PA Case: [id number], Status: Approved, Coverage Starts on: 08/14/2020 12:00:00 AM, Coverage Ends on: 11/12/2020 12:00:00 AM. Questions? Contact [phone].

## 2020-08-15 ENCOUNTER — Telehealth: Payer: Self-pay | Admitting: Gastroenterology

## 2020-08-15 NOTE — Telephone Encounter (Signed)
Spoke with pts husband and reviewed MR instructions and location with him and he is aware. Xifaxan PA was received yesterday and should be ready for pt to pick up.

## 2020-08-16 ENCOUNTER — Other Ambulatory Visit: Payer: Self-pay | Admitting: Gastroenterology

## 2020-08-20 ENCOUNTER — Other Ambulatory Visit (HOSPITAL_COMMUNITY): Payer: Medicare PPO

## 2020-08-20 ENCOUNTER — Other Ambulatory Visit: Payer: Self-pay

## 2020-08-20 ENCOUNTER — Ambulatory Visit (HOSPITAL_COMMUNITY)
Admission: RE | Admit: 2020-08-20 | Discharge: 2020-08-20 | Disposition: A | Discharge status: Home / Self Care | Payer: Medicare PPO | Source: Ambulatory Visit | Attending: Gastroenterology | Admitting: Gastroenterology

## 2020-08-20 ENCOUNTER — Other Ambulatory Visit: Payer: Self-pay | Admitting: Gastroenterology

## 2020-08-20 DIAGNOSIS — K862 Cyst of pancreas: Secondary | ICD-10-CM | POA: Insufficient documentation

## 2020-08-20 DIAGNOSIS — R935 Abnormal findings on diagnostic imaging of other abdominal regions, including retroperitoneum: Secondary | ICD-10-CM | POA: Diagnosis not present

## 2020-08-20 MED ORDER — GADOBUTROL 1 MMOL/ML IV SOLN
6.0000 mL | Freq: Once | INTRAVENOUS | Status: AC | PRN
  Administered 2020-08-20: 6 mL via INTRAVENOUS

## 2020-09-06 ENCOUNTER — Other Ambulatory Visit: Payer: Self-pay

## 2020-09-06 ENCOUNTER — Ambulatory Visit: Payer: Medicare PPO | Admitting: Endocrinology

## 2020-09-06 VITALS — BP 135/75 | HR 63 | Ht 63.0 in | Wt 144.2 lb

## 2020-09-06 DIAGNOSIS — R7989 Other specified abnormal findings of blood chemistry: Secondary | ICD-10-CM

## 2020-09-06 DIAGNOSIS — I10 Essential (primary) hypertension: Secondary | ICD-10-CM

## 2020-09-06 DIAGNOSIS — Z794 Long term (current) use of insulin: Secondary | ICD-10-CM | POA: Diagnosis not present

## 2020-09-06 DIAGNOSIS — E1165 Type 2 diabetes mellitus with hyperglycemia: Secondary | ICD-10-CM | POA: Diagnosis not present

## 2020-09-06 LAB — POCT GLYCOSYLATED HEMOGLOBIN (HGB A1C): Hemoglobin A1C: 6.6 % — AB (ref 4.0–5.6)

## 2020-09-06 LAB — MICROALBUMIN / CREATININE URINE RATIO
Creatinine,U: 50.7 mg/dL
Microalb Creat Ratio: 55.6 mg/g — ABNORMAL HIGH (ref 0.0–30.0)
Microalb, Ur: 28.2 mg/dL — ABNORMAL HIGH (ref 0.0–1.9)

## 2020-09-06 NOTE — Progress Notes (Signed)
Patient ID: [name] [name], female   DOB: Dec 08, 1935, 84 y.o.   MRN: [id number]           Reason for Appointment: Follow-up for Type 2 Diabetes   Referring PCP: Rory Percy   History of Present Illness:          Date of diagnosis of type 2 diabetes mellitus: At age 33   approximately      Background history:  She is a poor historian and not clear when she was started on insulin, probably at least since 2018 She thinks she has been on metformin in the past but this was causing diarrhea She does not know what other medications she has taken No prior A1c records are available except from 11/18  Recent history:   A1c is about the same at 6.8  INSULIN regimen is: Soliqua 22 units in am      Non-insulin hypoglycemic drugs the patient is taking are: None  Current management, blood sugar patterns and problems identified:  She was advised to decrease her Soliqua on her last visit by 4 units because of tendency to weight loss and decreased appetite  Patient thinks that she is eating fairly well although her husband thinks she is not consistent  As before she forgets to check her blood sugars after eating and only checking in the morning  Morning sugars are somewhat variable and as high as 188  However a few days ago she woke up sweating and blood sugar 49; she thinks she had a fairly good dinner the night before  Although she is eating her dinner around 6 PM she is usually not eating a snack at bedtime  She is usually fairly consistent with taking her Soliqua in the morning reportedly  Weight has gone up 7 pounds since her last visit  She is not able to do any walking for exercise       Side effects from medications have been: Diarrhea from metformin  :    Typical meal intake: Breakfast is   toast, eggs and meat, lunch usually half sandwich with fruit and sweet tea, dinner meat, potato, salad and tea.  Mostly snacks are fruits              Exercise:   she is trying to get on  her exercise bike, little walking limited by balance  Glucose monitoring:  Done 1 times a day         Glucometer:  Accu-Chek     Blood Glucose readings by download   FASTING blood sugar range 88-188 with AVERAGE 126 for the last 30 days After breakfast 123 4 AM = 49  PREVIOUS AVERAGE blood sugar 141 for the last 2 weeks   Dietician visit, most recent: Few years ago  Weight history:  Wt Readings from Last 3 Encounters:  09/06/20 144 lb 3.2 oz (65.4 kg)  08/01/20 142 lb (64.4 kg)  07/27/20 142 lb (64.4 kg)    Glycemic control: A1c in 11/19 was 8.3    Lab Results  Component Value Date   HGBA1C 6.6 (A) 09/06/2020   HGBA1C 6.8 (A) 06/07/2020   HGBA1C 6.9 (A) 03/07/2020   Lab Results  Component Value Date   MICROALBUR 28.8 (H) 06/28/2019   LDLCALC 43 11/04/2019   CREATININE 1.30 (H) 07/18/2020   Lab Results  Component Value Date   MICRALBCREAT 36.8 (H) 06/28/2019    No results found for: FRUCTOSAMINE  Office Visit on 09/06/2020  Component Date Value  Ref Range Status  . Hemoglobin A1C 09/06/2020 6.6* 4.0 - 5.6 % Final    Allergies as of 09/06/2020   No Known Allergies     Medication List       Accurate as of September 06, 2020  1:15 PM. If you have any questions, ask your nurse or doctor.        Accu-Chek Guide test strip Generic drug: glucose blood USE AS DIRECTED THREE TIMES DAILY   aspirin EC 81 MG tablet Take 81 mg by mouth daily.   atorvastatin 40 MG tablet Commonly known as: LIPITOR Take 20 mg by mouth daily.   B-12 1000 MCG Tabs Take 1,000 mcg by mouth daily.   Calcium Citrate-Vitamin D 200-250 MG-UNIT Tabs Take 1 tablet by mouth daily.   Centrum tablet Take 1 tablet by mouth daily.   colestipol 1 g tablet Commonly known as: COLESTID TAKE 1 TABLET BY MOUTH TWICE DAILY   Comfort EZ Pen Needles 31G X 5 MM Misc Generic drug: Insulin Pen Needle   diclofenac sodium 1 % Gel Commonly known as: VOLTAREN Apply 2 g topically at bedtime.    diclofenac Sodium 1 % Gel Commonly known as: VOLTAREN   DULoxetine 60 MG capsule Commonly known as: CYMBALTA Take 60 mg by mouth daily.   enalapril 20 MG tablet Commonly known as: VASOTEC Take 20 mg by mouth daily.   hydrochlorothiazide 25 MG tablet Commonly known as: HYDRODIURIL Take 25 mg by mouth every morning.   labetalol 200 MG tablet Commonly known as: NORMODYNE Take 200 mg by mouth daily.   magnesium oxide 400 MG tablet Commonly known as: MAG-OX Take 400 mg by mouth daily.   Pepcid 40 MG tablet Generic drug: famotidine Take 40 mg by mouth daily. TAKE 1 TABLET BY MOUTH ONCE DAILY.   famotidine 20 MG tablet Commonly known as: PEPCID   RA PROBIOTIC DIGESTIVE CARE PO Take 1 capsule by mouth daily.   rifaximin 550 MG Tabs tablet Commonly known as: XIFAXAN Take 1 tablet (550 mg total) by mouth 3 (three) times daily.   sertraline 50 MG tablet Commonly known as: ZOLOFT   Soliqua 100-33 UNT-MCG/ML Sopn Generic drug: Insulin Glargine-Lixisenatide Takes 22 units with breakfast       Allergies: No Known Allergies  Past Medical History:  Diagnosis Date  . Anemia   . Anxiety   . Arthritis   . Chronic back pain   . Chronic diarrhea   . Diabetes ([place])   . Diverticulosis   . GERD (gastroesophageal reflux disease)   . Hyperlipidemia   . Hypertension   . Internal hemorrhoids   . UTI (urinary tract infection)     Past Surgical History:  Procedure Laterality Date  . ABDOMINAL HYSTERECTOMY    . CARDIAC CATHETERIZATION    . CATARACT EXTRACTION, BILATERAL    . CHOLECYSTECTOMY    . COLONOSCOPY  11/2015   Dr. Britta Mccreedy: hyperplastic polyp from sigmoid colon. biopsies from ascending colon were negative for microscopic colitis.   . COLONOSCOPY N/A 03/31/2018   Dr. Gala Romney: Diverticulosis, random colon biopsies negative, hemorrhoids.  No future screening or surveillance colonoscopy is recommended.  . TONSILLECTOMY    . TRIGGER FINGER RELEASE      Family History   Problem Relation Age of Onset  . Heart attack Sister   . Heart attack Mother   . Diabetes Mother   . Heart attack Father   . Diabetes Son   . Diabetes Maternal Aunt   . Rectal cancer  Other        anal CA  . Colon cancer Neg Hx   . Celiac disease Neg Hx   . Inflammatory bowel disease Neg Hx   . Stomach cancer Neg Hx     Social History:  reports that she has never smoked. She has never used smokeless tobacco. She reports that she does not drink alcohol and does not use drugs.   Review of Systems   Lipid history: Treated by PCP with 40 mg Lipitor long-term She has no side effects with this No recent labs done   Lab Results  Component Value Date   CHOL 117 11/04/2019   HDL 50.70 11/04/2019   LDLCALC 43 11/04/2019   TRIG 117.0 11/04/2019   CHOLHDL 2 11/04/2019           Hypertension: Has been present for several years treated by PCP, is on enalapril 20 mg, Normodyne 200 mg and hydrochlorothiazide Blood pressure was unusually high on first measurement today  BP Readings from Last 3 Encounters:  09/06/20 135/75  08/01/20 (!) 148/74  07/09/20 128/60   She has had somewhat variable readings functions  Lab Results  Component Value Date   CREATININE 1.30 (H) 07/18/2020   CREATININE 0.96 04/18/2020   CREATININE 1.24 (H) 03/07/2020     Most recent eye exam was in 07/2020 but report not available, due for follow-up  Most recent foot exam: 2/20  Currently known complications of diabetes: Diabetic peripheral neuropathy  Has symptoms of numbness in feet and fingers  She previously had a TSH of 5.4 done in 01/2019 but this has been subsequently normal  Lab Results  Component Value Date   TSH 2.51 11/04/2019   TSH 2.42 11/16/2018   TSH 2.36 12/02/2017   FREET4 0.71 11/04/2019    Physical Examination:  BP 135/75   Pulse 63   Ht 5\' 3"  (1.6 m)   Wt 144 lb 3.2 oz (65.4 kg)   SpO2 94%   BMI 25.54 kg/m       ASSESSMENT:  Diabetes type 2, insulin  requiring  See history of present illness for detailed discussion of current diabetes management, blood sugar patterns and problems identified  Her A1c is improved at 6.6, previously was at 6.8  She is doing well on Soliqua as the only diabetes treatment, had been intolerant to Metformin  Even though she has gained weight her blood sugars overall are relatively lower Her fasting blood sugars are averaging about 125 Also has had recent episode of hypoglycemia during the night for no apparent reason, does not have any low sugars during the day Not clear if she has any tendency to lower readings in the afternoons or higher readings after meals since she does not monitor nonfasting blood sugars  Not complaining of decreased appetite with reducing Soliqua previously  HYPERTENSION: Blood pressure is better on the second measurement today and also followed by PCP She will try to check blood pressure at home also periodically  History of high TSH: We will recheck labs today  PLAN:    . Glucose monitoring: She does not need to check her sugar every morning  . Discussed blood sugar targets both fasting and after meals .  She was reminded to check some readings after supper or later in the day and not every morning . She will reduce her Soliqua down to 18 units . If she starts having high sugars she will let us know . May consider using Prandin if she  has high readings at any given time consistently . Have a protein with every meal . Otherwise may need to consider separate doses of Victoza and [place] . She will try to remember to take her injection before breakfast daily . Bedtime snack with protein daily, given examples  Labs to be done today: Chemistry panel, lipids and microalbumin  Follow-up in about 3 months      Patient Instructions  Soliqua 18 units daily  Bedtime snack daily  Check blood sugars on waking up 4-5 days a week  Also check blood sugars about 2 hours after  meals and do this after different meals by rotation  Recommended blood sugar levels on waking up are 90-130 and about 2 hours after meal is 130-160  Please bring your blood sugar monitor to each visit, thank you        Elayne Snare 09/06/2020, 1:15 PM   Note: This office note was prepared with Dragon voice recognition system technology. Any transcriptional errors that result from this process are unintentional.

## 2020-09-06 NOTE — Patient Instructions (Signed)
Soliqua 18 units daily  Bedtime snack daily  Check blood sugars on waking up 4-5 days a week  Also check blood sugars about 2 hours after meals and do this after different meals by rotation  Recommended blood sugar levels on waking up are 90-130 and about 2 hours after meal is 130-160  Please bring your blood sugar monitor to each visit, thank you

## 2020-09-10 ENCOUNTER — Encounter: Payer: Self-pay | Admitting: *Deleted

## 2020-09-11 ENCOUNTER — Ambulatory Visit: Payer: Medicare PPO | Admitting: Gastroenterology

## 2020-09-11 ENCOUNTER — Encounter: Payer: Self-pay | Admitting: Gastroenterology

## 2020-09-11 VITALS — BP 124/62 | HR 72 | Ht 61.42 in | Wt 142.2 lb

## 2020-09-11 DIAGNOSIS — K591 Functional diarrhea: Secondary | ICD-10-CM | POA: Diagnosis not present

## 2020-09-11 DIAGNOSIS — K862 Cyst of pancreas: Secondary | ICD-10-CM | POA: Diagnosis not present

## 2020-09-11 DIAGNOSIS — Z23 Encounter for immunization: Secondary | ICD-10-CM | POA: Diagnosis not present

## 2020-09-11 NOTE — Patient Instructions (Addendum)
I have not changed your medications today. Please continue to take colestipol 1 g twice daily.  I would like to see you again in 3-4 months. However, we ask that you please call the office to schedule your appointment.  Please call or send a MyChart message with any questions or concerns in the meantime.   If you are age 82 or older, your body mass index should be between 23-30. Your There is no height or weight on file to calculate BMI. If this is out of the aforementioned range listed, please consider follow up with your Primary Care Provider.  Thank you for trusting me with your gastrointestinal care!    Thornton Park, MD, MPH

## 2020-09-11 NOTE — Progress Notes (Signed)
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 Evaluation included normal/negative CRP, TTGA/IgA, pancreatic elastase, GI pathogen panel, CT abd/pelvis. Now improved after 14 days of Xifaxan and colestipol.   Normocytic anemia: Recent labs are reassuring.  Pancreatic head cyst seen on CT and MRI: [place] enzymes normal earlier this year. Please follow-up MRI/MRCP in 1 year. Proceed with EGD with EUS if symptoms return.     PLAN: Repeat liver function tests Continue colestipol 1 g BID Fecal calprotectin if symptoms return Return in 3-4 months, earlier if needed   Please see the "Patient Instructions" section for addition details about the plan.  HPI: [name] [name] is a 84 y.o. female who returns in scheduled follow-up.  She has chronic diarrhea.  Her husband accompanies her to this appointment.  Her diarrhea has been evaluated by Dr. Britta Mccreedy and Dr. Gala Romney in the past,  having colonoscopies with normal random colon biopsies in 2017 and 2019.  Seen in consultation by Carl Best 04/18/2020.  At that time she reported a 2-year history of chronic diarrhea that had worsened over the prior 7 to 8 months.  She described watery to thick mud like loose stools 3-4 times every other day. Often her first bowel movement will be somewhat solid, subsequent bowel movements are watery to mud-like. She sometimes soils herself as she is unable to get to the bathroom in time. No nighttime  fecal incontinence. No rectal bleeding or melena. No upper or lower abdominal pain. Her dairy intake is limited. No new medications. She was previously on Metformin which was discontinued due to her diarrhea.  She has been on Cymbalta for the past 6 years. She had lost 20 pounds over the past 1-1/2 years.  At the time of her visit with me 07/09/20 she was reporting  ongoing diarrhea with associated abdominal pain. Occurring at least weekly, although in the past was as frequent as every other day.  No blood or mucous. No nocturnal symptoms. Occasionally has constipation preceding the diarrhea. No identified food triggers including no symptoms related to mammalian products.    Evaluation has included: - TSH normal 11/04/2019 - Normal TTG a, IgA, CRP, pancreatic elastase, and GI pathogen panel in July 2021 - CBC with hemoglobin of 10.5, MCV 86.5, RDW 178 03/2020 - CT abd/pelvis with contrast 07/18/20: Prominent soft tissue at posterior aspect of upper to mid stomach, 2.2 x 1.6 x 4.4 cm, may represent thickened rugal folds, focal gastritis, mass, or food debris; recommend correlation with endoscopy. 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 Minimal intrahepatic biliary dilatation which could be related to prior cholecystectomy, recommend correlation with LFTs. 11 x 8 x 8 mm cystic lesion at pancreatic head; recommend follow up pre and post contrast MRI/MRCP or pancreatic protocol CT in 2 years. - EGD 08/01/20: Gastritis, duodenal diverticulum. Biopsies showed reactive gastropathy with mild chronic inflammation. No H pylori.  -  MRI/MRCP 08/20/20: Stable small cystic lesions in the pancreatic head, unchanged from recent CT and without aggressive characteristics. These  may be postinflammatory or reflect an indolent cystic neoplasm. Stable mild intrahepatic biliary dilatation post cholecystectomy. No evidence of choledocholithiasis.  Returns today in scheduled follow-up. Diarrhea has resolved after completing  14 days of Xifaxan and using colestipol 1g BID. Having one formed bowel movement QD to QOD.  Abdominal has minimized.  Thrilled not to be running to the bathroom all of the time.  She has resumed going out to dinner and to church. No new complaints or concerns. Weight is now stabilizing.   Continues to follow a diabetic diet. Drinks Diet Coke daily. No other sugar substitutes.     Prior endoscopic history: - Colonoscopy with Dr. Britta Mccreedy 11/28/2015 revealed a sigmoid colon semipedunculated hyperplastic polyp.  Random colon biopsies were normal.  There was no evidence of lymphocytic or collagenous colitis - Colonoscopy with Dr. Gala Romney in 2019 revealed diverticulosis and internal hemorrhoids.  Biopsies from the right and left colon were normal. - EGD 08/01/20: Gastritis, duodenal diverticulum. Biopsies showed reactive gastropathy with mild chronic inflammation. No H pylori.    Past Medical History:  Diagnosis Date  . Anemia   . Anxiety   . Arthritis   . Chronic back pain   . Chronic diarrhea   . Diabetes ([place])   . Diverticulosis   . GERD (gastroesophageal reflux disease)   . Hyperlipidemia   . Hypertension   . Internal hemorrhoids   . UTI (urinary tract infection)     Past Surgical History:  Procedure Laterality Date  . ABDOMINAL HYSTERECTOMY    . CARDIAC CATHETERIZATION    . CATARACT EXTRACTION, BILATERAL    . CHOLECYSTECTOMY    . COLONOSCOPY  11/2015   Dr. Britta Mccreedy: hyperplastic polyp from sigmoid colon. biopsies from ascending colon were negative for microscopic colitis.   . COLONOSCOPY N/A 03/31/2018   Dr. Gala Romney: Diverticulosis, random colon biopsies negative, hemorrhoids.  No future screening or surveillance colonoscopy is recommended.  . TONSILLECTOMY    . TRIGGER FINGER RELEASE      Current Outpatient Medications  Medication Sig Dispense Refill  . ACCU-CHEK GUIDE test strip USE AS DIRECTED THREE TIMES DAILY 150 strip 3  . aspirin EC 81 MG tablet Take 81 mg by mouth daily.     Marland Kitchen atorvastatin (LIPITOR) 40 MG tablet Take 20 mg by mouth daily.     . Calcium Citrate-Vitamin D 200-250 MG-UNIT TABS Take 1 tablet by mouth daily.    . colestipol (COLESTID) 1 g tablet TAKE 1 TABLET BY MOUTH TWICE DAILY 60 tablet 1  . COMFORT EZ PEN NEEDLES 31G X 5 MM MISC     . Cyanocobalamin (B-12) 1000 MCG TABS Take 1,000 mcg by mouth daily.     . diclofenac sodium (VOLTAREN) 1 % GEL Apply 2 g topically at bedtime.    . DULoxetine (CYMBALTA) 60 MG capsule Take 60 mg by mouth daily.    . enalapril (VASOTEC) 20 MG tablet Take 20 mg by mouth daily.    . famotidine (PEPCID) 40 MG tablet Take 40 mg by mouth daily.    . hydrochlorothiazide (HYDRODIURIL) 25 MG tablet Take 25 mg by mouth every morning.     . Insulin Glargine-Lixisenatide (SOLIQUA) 100-33 UNT-MCG/ML SOPN Takes 22 units with breakfast (Patient taking differently: Takes 18 units with breakfast) 15 mL 1  . labetalol (NORMODYNE) 200 MG tablet Take 200 mg by mouth daily.     . Lactobacillus Rhamnosus, GG, (RA PROBIOTIC DIGESTIVE CARE PO) Take 1 capsule by mouth daily.    . magnesium oxide (  MAG-OX) 400 MG tablet Take 400 mg by mouth daily.    . Multiple Vitamins-Minerals (CENTRUM) tablet Take 1 tablet by mouth daily.    . sertraline (ZOLOFT) 50 MG tablet      No current facility-administered medications for this visit.    Allergies as of 09/11/2020  . (No Known Allergies)    Family History  Problem Relation Age of Onset  . Heart attack Sister   . Heart attack Mother   . Diabetes Mother   . Heart attack Father   . Diabetes Son   . Diabetes Maternal Aunt   . Rectal cancer Other        anal CA  . Colon cancer Neg Hx   . Celiac disease Neg Hx   . Inflammatory bowel disease Neg Hx   . Stomach cancer Neg Hx     Social History   Socioeconomic History  . Marital status: Married    Spouse name: Not on file  . Number of children: Not on file  . Years of education: Not on file  . Highest education level: Not on file   Occupational History  . Not on file  Tobacco Use  . Smoking status: Never Smoker  . Smokeless tobacco: Never Used  Vaping Use  . Vaping Use: Never used  Substance and Sexual Activity  . Alcohol use: No    Alcohol/week: 0.0 standard drinks  . Drug use: No  . Sexual activity: Not on file  Other Topics Concern  . Not on file  Social History Narrative  . Not on file   Social Determinants of Health   Financial Resource Strain: Not on file  Food Insecurity: Not on file  Transportation Needs: Not on file  Physical Activity: Not on file  Stress: Not on file  Social Connections: Not on file  Intimate Partner Violence: Not on file     Physical Exam: General:   Alert,  well-nourished, pleasant and cooperative in NAD Head:  Normocephalic and atraumatic. Eyes:  Sclera clear, no icterus.   Conjunctiva pink. Abdomen:  Soft, nontender, nondistended, normal bowel sounds, no rebound or guarding. No hepatosplenomegaly.   Msk:  Symmetrical. No boney deformities LAD: No inguinal or umbilical LAD Extremities:  No clubbing or edema. Neurologic:  Alert and  oriented x4;  grossly nonfocal Skin:  Intact without significant lesions or rashes. Psych:  Alert and cooperative. Normal mood and affect.     Armend Hochstatter L. Tarri Glenn, MD, MPH 09/11/2020, 9:03 AM

## 2020-10-15 ENCOUNTER — Other Ambulatory Visit: Payer: Self-pay | Admitting: Gastroenterology

## 2020-10-16 ENCOUNTER — Encounter: Payer: Self-pay | Admitting: *Deleted

## 2020-11-27 DIAGNOSIS — G8929 Other chronic pain: Secondary | ICD-10-CM | POA: Diagnosis not present

## 2020-11-27 DIAGNOSIS — E1165 Type 2 diabetes mellitus with hyperglycemia: Secondary | ICD-10-CM | POA: Diagnosis not present

## 2020-11-27 DIAGNOSIS — E1140 Type 2 diabetes mellitus with diabetic neuropathy, unspecified: Secondary | ICD-10-CM | POA: Diagnosis not present

## 2020-11-27 DIAGNOSIS — E785 Hyperlipidemia, unspecified: Secondary | ICD-10-CM | POA: Diagnosis not present

## 2020-11-27 DIAGNOSIS — F325 Major depressive disorder, single episode, in full remission: Secondary | ICD-10-CM | POA: Diagnosis not present

## 2020-11-27 DIAGNOSIS — Z794 Long term (current) use of insulin: Secondary | ICD-10-CM | POA: Diagnosis not present

## 2020-11-27 DIAGNOSIS — F419 Anxiety disorder, unspecified: Secondary | ICD-10-CM | POA: Diagnosis not present

## 2020-11-27 DIAGNOSIS — E663 Overweight: Secondary | ICD-10-CM | POA: Diagnosis not present

## 2020-11-27 DIAGNOSIS — I10 Essential (primary) hypertension: Secondary | ICD-10-CM | POA: Diagnosis not present

## 2020-12-05 ENCOUNTER — Ambulatory Visit: Payer: Medicare PPO | Admitting: Endocrinology

## 2020-12-05 ENCOUNTER — Encounter: Payer: Self-pay | Admitting: Endocrinology

## 2020-12-05 ENCOUNTER — Other Ambulatory Visit: Payer: Self-pay

## 2020-12-05 VITALS — BP 124/88 | HR 74 | Ht 61.0 in | Wt 146.0 lb

## 2020-12-05 DIAGNOSIS — E1142 Type 2 diabetes mellitus with diabetic polyneuropathy: Secondary | ICD-10-CM | POA: Diagnosis not present

## 2020-12-05 DIAGNOSIS — R7989 Other specified abnormal findings of blood chemistry: Secondary | ICD-10-CM

## 2020-12-05 DIAGNOSIS — Z794 Long term (current) use of insulin: Secondary | ICD-10-CM | POA: Diagnosis not present

## 2020-12-05 DIAGNOSIS — E1165 Type 2 diabetes mellitus with hyperglycemia: Secondary | ICD-10-CM | POA: Diagnosis not present

## 2020-12-05 LAB — COMPREHENSIVE METABOLIC PANEL
ALT: 18 U/L (ref 0–35)
AST: 19 U/L (ref 0–37)
Albumin: 4 g/dL (ref 3.5–5.2)
Alkaline Phosphatase: 77 U/L (ref 39–117)
BUN: 30 mg/dL — ABNORMAL HIGH (ref 6–23)
CO2: 26 mEq/L (ref 19–32)
Calcium: 9.6 mg/dL (ref 8.4–10.5)
Chloride: 106 mEq/L (ref 96–112)
Creatinine, Ser: 1.13 mg/dL (ref 0.40–1.20)
GFR: 45.2 mL/min — ABNORMAL LOW (ref >60.00)
Glucose, Bld: 97 mg/dL (ref 70–99)
Potassium: 4.4 mEq/L (ref 3.5–5.1)
Sodium: 138 mEq/L (ref 135–145)
Total Bilirubin: 0.5 mg/dL (ref 0.2–1.2)
Total Protein: 6.9 g/dL (ref 6.0–8.3)

## 2020-12-05 LAB — POCT GLYCOSYLATED HEMOGLOBIN (HGB A1C): Hemoglobin A1C: 7.2 % — AB (ref 4.0–5.6)

## 2020-12-05 LAB — LIPID PANEL
Cholesterol: 110 mg/dL (ref 0–200)
HDL: 53.2 mg/dL (ref >39.00)
LDL Cholesterol: 38 mg/dL (ref 0–99)
NonHDL: 57.06
Total CHOL/HDL Ratio: 2
Triglycerides: 95 mg/dL (ref 0.0–149.0)
VLDL: 19 mg/dL (ref 0.0–40.0)

## 2020-12-05 LAB — POCT GLUCOSE (DEVICE FOR HOME USE): POC Glucose: 125 mg/dl — AB (ref 70–99)

## 2020-12-05 NOTE — Patient Instructions (Signed)
Stay on 18 Units DAILY  BEDTIME SNACK daily  Check blood sugars on waking up 3-4 days a week  Also check blood sugars about 2 hours after meals and do this after different meals by rotation  Recommended blood sugar levels on waking up are 90-130 and about 2 hours after meal is 130-160  Please bring your blood sugar monitor to each visit, thank you

## 2020-12-05 NOTE — Progress Notes (Signed)
Patient ID: [name] [name], female   DOB: 12-24-1935, 85 y.o.   MRN: [id number]           Reason for Appointment: Follow-up for Type 2 Diabetes   Referring PCP: Rory Percy   History of Present Illness:          Date of diagnosis of type 2 diabetes mellitus: At age 82   approximately      Background history:  She is a poor historian and not clear when she was started on insulin, probably at least since 2018 She thinks she has been on metformin in the past but this was causing diarrhea She does not know what other medications she has taken No prior A1c records are available except from 11/18  Recent history:   A1c is 7.2 compared to 6.6   INSULIN regimen is: Soliqua 18 units in am      Non-insulin hypoglycemic drugs the patient is taking are: None  Current management, blood sugar patterns and problems identified:  No clear why her A1c is slightly higher than before  On her last visit because of occasional episodes of low sugars overnight she was told to reduce her Soliqua by 4 units  She did not bring her monitor for download today and not clear what her blood sugar patterns are  However she admits that sometimes if the blood sugar is slightly higher she will take 22 units  Again she thinks she still has low sugars overnight every 2 to 3 months including within the last month  She will feels shaky during the nighttime blood sugar may be as low as 47  Otherwise she thinks her fasting blood sugars are in the low 100 range, highest in the 160s  She does not take a bedtime snack despite repeated reminders  Also forgets to check her sugars after meals  Today blood sugar is about 125, 3 to 4 hours after breakfast  Weight has gone up 2 pounds since her last visit  She is not able to do any walking for exercise       Side effects from medications have been: Diarrhea from metformin  :    Typical meal intake: Breakfast is   toast, eggs and meat, lunch usually half  sandwich with fruit and sweet tea, dinner meat, potato, salad and tea.  Mostly snacks are fruits              Exercise:   she is trying to get on her exercise bike, little walking limited by balance  Glucose monitoring:  Done 1 times a day         Glucometer:  Accu-Chek     Blood Glucose readings by recall as above  Previous download:  FASTING blood sugar range 88-188 with AVERAGE 126 for the last 30 days After breakfast 123 4 AM = 49  PREVIOUS AVERAGE blood sugar 141 for the last 2 weeks   Dietician visit, most recent: Few years ago  Weight history:  Wt Readings from Last 3 Encounters:  12/05/20 146 lb (66.2 kg)  09/11/20 142 lb 4 oz (64.5 kg)  09/06/20 144 lb 3.2 oz (65.4 kg)    Glycemic control: A1c in 11/19 was 8.3    Lab Results  Component Value Date   HGBA1C 7.2 (A) 12/05/2020   HGBA1C 6.6 (A) 09/06/2020   HGBA1C 6.8 (A) 06/07/2020   Lab Results  Component Value Date   MICROALBUR 28.2 (H) 09/06/2020   LDLCALC 43 11/04/2019  CREATININE 1.30 (H) 07/18/2020   Lab Results  Component Value Date   MICRALBCREAT 55.6 (H) 09/06/2020    No results found for: FRUCTOSAMINE  Office Visit on 12/05/2020  Component Date Value Ref Range Status  . Hemoglobin A1C 12/05/2020 7.2* 4.0 - 5.6 % Final  . POC Glucose 12/05/2020 125* 70 - 99 mg/dl Final    Allergies as of 12/05/2020   No Known Allergies     Medication List       Accurate as of December 05, 2020  4:29 PM. If you have any questions, ask your nurse or doctor.        Accu-Chek Guide test strip Generic drug: glucose blood USE AS DIRECTED THREE TIMES DAILY   aspirin EC 81 MG tablet Take 81 mg by mouth daily.   atorvastatin 40 MG tablet Commonly known as: LIPITOR Take 20 mg by mouth daily.   B-12 1000 MCG Tabs Take 1,000 mcg by mouth daily.   Calcium Citrate-Vitamin D 200-250 MG-UNIT Tabs Take 1 tablet by mouth daily.   Centrum tablet Take 1 tablet by mouth daily.   colestipol 1 g  tablet Commonly known as: COLESTID TAKE 1 TABLET BY MOUTH TWICE DAILY   Comfort EZ Pen Needles 31G X 5 MM Misc Generic drug: Insulin Pen Needle   diclofenac sodium 1 % Gel Commonly known as: VOLTAREN Apply 2 g topically at bedtime.   DULoxetine 60 MG capsule Commonly known as: CYMBALTA Take 60 mg by mouth daily.   enalapril 20 MG tablet Commonly known as: VASOTEC Take 20 mg by mouth daily.   famotidine 40 MG tablet Commonly known as: PEPCID Take 40 mg by mouth daily.   hydrochlorothiazide 25 MG tablet Commonly known as: HYDRODIURIL Take 25 mg by mouth every morning.   labetalol 200 MG tablet Commonly known as: NORMODYNE Take 200 mg by mouth daily.   magnesium oxide 400 MG tablet Commonly known as: MAG-OX Take 400 mg by mouth daily.   RA PROBIOTIC DIGESTIVE CARE PO Take 1 capsule by mouth daily.   sertraline 50 MG tablet Commonly known as: ZOLOFT   Soliqua 100-33 UNT-MCG/ML Sopn Generic drug: Insulin Glargine-Lixisenatide Takes 22 units with breakfast What changed: additional instructions       Allergies: No Known Allergies  Past Medical History:  Diagnosis Date  . Anemia   . Anxiety   . Arthritis   . Chronic back pain   . Chronic diarrhea   . Diabetes ([place])   . Diverticulosis   . GERD (gastroesophageal reflux disease)   . Hyperlipidemia   . Hypertension   . Internal hemorrhoids   . UTI (urinary tract infection)     Past Surgical History:  Procedure Laterality Date  . ABDOMINAL HYSTERECTOMY    . CARDIAC CATHETERIZATION    . CATARACT EXTRACTION, BILATERAL    . CHOLECYSTECTOMY    . COLONOSCOPY  11/2015   Dr. Britta Mccreedy: hyperplastic polyp from sigmoid colon. biopsies from ascending colon were negative for microscopic colitis.   . COLONOSCOPY N/A 03/31/2018   Dr. Gala Romney: Diverticulosis, random colon biopsies negative, hemorrhoids.  No future screening or surveillance colonoscopy is recommended.  . TONSILLECTOMY    . TRIGGER FINGER RELEASE       Family History  Problem Relation Age of Onset  . Heart attack Sister   . Heart attack Mother   . Diabetes Mother   . Heart attack Father   . Diabetes Son   . Diabetes Maternal Aunt   . Rectal cancer  Other        anal CA  . Colon cancer Neg Hx   . Celiac disease Neg Hx   . Inflammatory bowel disease Neg Hx   . Stomach cancer Neg Hx     Social History:  reports that she has never smoked. She has never used smokeless tobacco. She reports that she does not drink alcohol and does not use drugs.   Review of Systems   Lipid history: Treated by PCP with 40 mg Lipitor long-term She has no side effects with this No recent labs done   Lab Results  Component Value Date   CHOL 117 11/04/2019   HDL 50.70 11/04/2019   LDLCALC 43 11/04/2019   TRIG 117.0 11/04/2019   CHOLHDL 2 11/04/2019           Hypertension: Has been present for several years treated by PCP, is on enalapril 20 mg, Normodyne 200 mg and hydrochlorothiazide Blood pressure may be high on first measurement on her office visits  BP Readings from Last 3 Encounters:  12/05/20 124/88  09/11/20 124/62  09/06/20 135/75   She has had somewhat variable readings functions  Lab Results  Component Value Date   CREATININE 1.30 (H) 07/18/2020   CREATININE 0.96 04/18/2020   CREATININE 1.24 (H) 03/07/2020     Most recent eye exam was in 07/2020 but report not available, due for follow-up  Most recent foot exam: 2/20  Currently known complications of diabetes: Diabetic peripheral neuropathy  Has symptoms of numbness in feet and fingers  She previously had a TSH of 5.4 done in 01/2019 but this has been subsequently normal  Lab Results  Component Value Date   TSH 2.51 11/04/2019   TSH 2.42 11/16/2018   TSH 2.36 12/02/2017   FREET4 0.71 11/04/2019    Physical Examination:  BP 124/88   Pulse 74   Ht 5\' 1"  (1.549 m)   Wt 146 lb (66.2 kg)   SpO2 99%   BMI 27.59 kg/m   Diabetic Foot Exam - Simple   Simple  Foot Form Diabetic Foot exam was performed with the following findings: Yes   Visual Inspection See comments: Yes Sensation Testing See comments: Yes Pulse Check See comments: Yes Comments She has yellowish crusting of the distal first toes and some callus formation bilaterally Monofilament sensation is practically absent in the toes Dorsalis pedis pulses 2/4 bilaterally        ASSESSMENT:  Diabetes type 2, insulin requiring  See history of present illness for detailed discussion of current diabetes management, blood sugar patterns and problems identified  Her A1c is still reasonably good at 7.2 previously 6.6  She is doing well on Soliqua as the only diabetes treatment, had been intolerant to Metformin Blood sugar monitoring is inadequate and also she did not bring her meter for download She still has sporadic low sugars overnight  Diabetic peripheral neuropathy with distal sensory loss  Follow-up for hyperlipidemia needed  Previous history of high TSH: Repeat to be done  HYPERTENSION: To continue follow-up with PCP  PLAN:    . Glucose monitoring: She was reminded to start checking blood sugars alternating morning and after meals or at bedtime . Also discussed that her morning sugars should range from 90-130 and after meals no more than 180 . She should not change the dose of her [place] Niece but call us if her morning readings are consistently high or low . Her husband will help her remember to take a bedtime snack  and give her the options which included peanut butter crackers but not fruit . She will try to make sure she takes her [place] before eating and continue 18 units every morning . Not need to check her sugar every morning  . Encourage her to be as active as possible indoors . To check bottom of her feet every day including with using a hand mirror  Urine microalbumin to be checked today along with chemistry and lipids which are overdue  Follow-up in about 3  months  Total visit time including counseling =30 minutes    Patient Instructions  Stay on 18 Units DAILY  BEDTIME SNACK daily  Check blood sugars on waking up 3-4 days a week  Also check blood sugars about 2 hours after meals and do this after different meals by rotation  Recommended blood sugar levels on waking up are 90-130 and about 2 hours after meal is 130-160  Please bring your blood sugar monitor to each visit, thank you        Elayne Snare 12/05/2020, 4:29 PM   Note: This office note was prepared with Dragon voice recognition system technology. Any transcriptional errors that result from this process are unintentional.

## 2020-12-06 ENCOUNTER — Other Ambulatory Visit: Payer: Self-pay

## 2020-12-06 ENCOUNTER — Observation Stay (HOSPITAL_COMMUNITY)
Admission: EM | Admit: 2020-12-06 | Discharge: 2020-12-08 | Disposition: A | Discharge status: Home / Self Care | Payer: Medicare PPO | Attending: Internal Medicine | Admitting: Internal Medicine

## 2020-12-06 ENCOUNTER — Emergency Department (HOSPITAL_COMMUNITY): Payer: Medicare PPO

## 2020-12-06 ENCOUNTER — Encounter (HOSPITAL_COMMUNITY): Payer: Self-pay

## 2020-12-06 DIAGNOSIS — K219 Gastro-esophageal reflux disease without esophagitis: Secondary | ICD-10-CM | POA: Diagnosis present

## 2020-12-06 DIAGNOSIS — N3000 Acute cystitis without hematuria: Secondary | ICD-10-CM

## 2020-12-06 DIAGNOSIS — I10 Essential (primary) hypertension: Secondary | ICD-10-CM | POA: Diagnosis not present

## 2020-12-06 DIAGNOSIS — R479 Unspecified speech disturbances: Secondary | ICD-10-CM | POA: Diagnosis not present

## 2020-12-06 DIAGNOSIS — Z20822 Contact with and (suspected) exposure to covid-19: Secondary | ICD-10-CM | POA: Insufficient documentation

## 2020-12-06 DIAGNOSIS — R471 Dysarthria and anarthria: Secondary | ICD-10-CM | POA: Diagnosis present

## 2020-12-06 DIAGNOSIS — G319 Degenerative disease of nervous system, unspecified: Secondary | ICD-10-CM | POA: Diagnosis not present

## 2020-12-06 DIAGNOSIS — Z7982 Long term (current) use of aspirin: Secondary | ICD-10-CM | POA: Diagnosis not present

## 2020-12-06 DIAGNOSIS — R42 Dizziness and giddiness: Secondary | ICD-10-CM | POA: Diagnosis not present

## 2020-12-06 DIAGNOSIS — Z79899 Other long term (current) drug therapy: Secondary | ICD-10-CM | POA: Insufficient documentation

## 2020-12-06 DIAGNOSIS — E119 Type 2 diabetes mellitus without complications: Secondary | ICD-10-CM

## 2020-12-06 DIAGNOSIS — Z9181 History of falling: Secondary | ICD-10-CM | POA: Diagnosis not present

## 2020-12-06 DIAGNOSIS — Z794 Long term (current) use of insulin: Secondary | ICD-10-CM | POA: Diagnosis not present

## 2020-12-06 DIAGNOSIS — G459 Transient cerebral ischemic attack, unspecified: Secondary | ICD-10-CM | POA: Diagnosis not present

## 2020-12-06 DIAGNOSIS — I160 Hypertensive urgency: Secondary | ICD-10-CM | POA: Diagnosis not present

## 2020-12-06 DIAGNOSIS — E876 Hypokalemia: Secondary | ICD-10-CM | POA: Diagnosis not present

## 2020-12-06 DIAGNOSIS — N390 Urinary tract infection, site not specified: Secondary | ICD-10-CM | POA: Diagnosis present

## 2020-12-06 DIAGNOSIS — Z6826 Body mass index (BMI) 26.0-26.9, adult: Secondary | ICD-10-CM | POA: Diagnosis not present

## 2020-12-06 DIAGNOSIS — E785 Hyperlipidemia, unspecified: Secondary | ICD-10-CM | POA: Diagnosis present

## 2020-12-06 DIAGNOSIS — D649 Anemia, unspecified: Secondary | ICD-10-CM | POA: Diagnosis present

## 2020-12-06 LAB — URINALYSIS, ROUTINE W REFLEX MICROSCOPIC
Bilirubin Urine: NEGATIVE
Glucose, UA: NEGATIVE mg/dL
Ketones, ur: NEGATIVE mg/dL
Nitrite: NEGATIVE
Protein, ur: 100 mg/dL — AB
Specific Gravity, Urine: 1.009 (ref 1.005–1.030)
pH: 5 (ref 5.0–8.0)

## 2020-12-06 LAB — I-STAT CHEM 8, ED
BUN: 28 mg/dL — ABNORMAL HIGH (ref 8–23)
Calcium, Ion: 1.28 mmol/L (ref 1.15–1.40)
Chloride: 105 mmol/L (ref 98–111)
Creatinine, Ser: 0.9 mg/dL (ref 0.44–1.00)
Glucose, Bld: 101 mg/dL — ABNORMAL HIGH (ref 70–99)
HCT: 35 % — ABNORMAL LOW (ref 36.0–46.0)
Hemoglobin: 11.9 g/dL — ABNORMAL LOW (ref 12.0–15.0)
Potassium: 4.2 mmol/L (ref 3.5–5.1)
Sodium: 141 mmol/L (ref 135–145)
TCO2: 26 mmol/L (ref 22–32)

## 2020-12-06 LAB — COMPREHENSIVE METABOLIC PANEL
ALT: 21 U/L (ref 0–44)
AST: 20 U/L (ref 15–41)
Albumin: 4.7 g/dL (ref 3.5–5.0)
Alkaline Phosphatase: 91 U/L (ref 38–126)
Anion gap: 11 (ref 5–15)
BUN: 27 mg/dL — ABNORMAL HIGH (ref 8–23)
CO2: 23 mmol/L (ref 22–32)
Calcium: 9.8 mg/dL (ref 8.9–10.3)
Chloride: 106 mmol/L (ref 98–111)
Creatinine, Ser: 0.87 mg/dL (ref 0.44–1.00)
GFR, Estimated: >60 mL/min (ref >60)
Glucose, Bld: 103 mg/dL — ABNORMAL HIGH (ref 70–99)
Potassium: 4.2 mmol/L (ref 3.5–5.1)
Sodium: 140 mmol/L (ref 135–145)
Total Bilirubin: 0.6 mg/dL (ref 0.3–1.2)
Total Protein: 7.8 g/dL (ref 6.5–8.1)

## 2020-12-06 LAB — ETHANOL: Alcohol, Ethyl (B): <10 mg/dL (ref <10)

## 2020-12-06 LAB — RAPID URINE DRUG SCREEN, HOSP PERFORMED
Amphetamines: NOT DETECTED
Barbiturates: NOT DETECTED
Benzodiazepines: NOT DETECTED
Cocaine: NOT DETECTED
Opiates: NOT DETECTED
Tetrahydrocannabinol: NOT DETECTED

## 2020-12-06 LAB — CBC
HCT: 35.3 % — ABNORMAL LOW (ref 36.0–46.0)
Hemoglobin: 11.5 g/dL — ABNORMAL LOW (ref 12.0–15.0)
MCH: 28.9 pg (ref 26.0–34.0)
MCHC: 32.6 g/dL (ref 30.0–36.0)
MCV: 88.7 fL (ref 80.0–100.0)
Platelets: 206 10*3/uL (ref 150–400)
RBC: 3.98 MIL/uL (ref 3.87–5.11)
RDW: 13.5 % (ref 11.5–15.5)
WBC: 8.6 10*3/uL (ref 4.0–10.5)
nRBC: 0 % (ref 0.0–0.2)

## 2020-12-06 LAB — RESP PANEL BY RT-PCR (FLU A&B, COVID) ARPGX2
Influenza A by PCR: NEGATIVE
Influenza B by PCR: NEGATIVE
SARS Coronavirus 2 by RT PCR: NEGATIVE

## 2020-12-06 LAB — TSH: TSH: 2.16 u[IU]/mL (ref 0.35–4.50)

## 2020-12-06 LAB — DIFFERENTIAL
Abs Immature Granulocytes: 0.02 10*3/uL (ref 0.00–0.07)
Basophils Absolute: 0.1 10*3/uL (ref 0.0–0.1)
Basophils Relative: 1 %
Eosinophils Absolute: 0.1 10*3/uL (ref 0.0–0.5)
Eosinophils Relative: 2 %
Immature Granulocytes: 0 %
Lymphocytes Relative: 18 %
Lymphs Abs: 1.6 10*3/uL (ref 0.7–4.0)
Monocytes Absolute: 0.8 10*3/uL (ref 0.1–1.0)
Monocytes Relative: 9 %
Neutro Abs: 6.1 10*3/uL (ref 1.7–7.7)
Neutrophils Relative %: 70 %

## 2020-12-06 LAB — PROTIME-INR
INR: 1 (ref 0.8–1.2)
Prothrombin Time: 12.5 seconds (ref 11.4–15.2)

## 2020-12-06 LAB — APTT: aPTT: 29 seconds (ref 24–36)

## 2020-12-06 MED ORDER — SODIUM CHLORIDE 0.9 % IV SOLN
100.0000 mL/h | INTRAVENOUS | Status: DC
  Administered 2020-12-06 – 2020-12-07 (×2): 100 mL/h via INTRAVENOUS

## 2020-12-06 MED ORDER — SERTRALINE HCL 50 MG PO TABS
50.0000 mg | ORAL_TABLET | Freq: Every day | ORAL | Status: DC
  Administered 2020-12-07 – 2020-12-08 (×2): 50 mg via ORAL
  Filled 2020-12-06 (×2): qty 1

## 2020-12-06 MED ORDER — MAGNESIUM OXIDE 400 (241.3 MG) MG PO TABS
400.0000 mg | ORAL_TABLET | Freq: Every day | ORAL | Status: DC
  Administered 2020-12-07 – 2020-12-08 (×2): 400 mg via ORAL
  Filled 2020-12-06 (×2): qty 1

## 2020-12-06 MED ORDER — SENNOSIDES-DOCUSATE SODIUM 8.6-50 MG PO TABS: 1.0000 | ORAL_TABLET | Freq: Every evening | ORAL | Status: DC | PRN

## 2020-12-06 MED ORDER — RISAQUAD PO CAPS
ORAL_CAPSULE | Freq: Every day | ORAL | Status: DC
  Filled 2020-12-06: qty 1

## 2020-12-06 MED ORDER — ACETAMINOPHEN 160 MG/5ML PO SOLN
650.0000 mg | ORAL | Status: DC | PRN
  Filled 2020-12-06: qty 20.3

## 2020-12-06 MED ORDER — DICLOFENAC SODIUM 1 % TD GEL
2.0000 g | Freq: Every day | TRANSDERMAL | Status: DC
  Administered 2020-12-06 – 2020-12-07 (×2): 2 g via TOPICAL
  Filled 2020-12-06: qty 100

## 2020-12-06 MED ORDER — ATORVASTATIN CALCIUM 20 MG PO TABS
20.0000 mg | ORAL_TABLET | Freq: Every day | ORAL | Status: DC
  Administered 2020-12-07 – 2020-12-08 (×2): 20 mg via ORAL
  Filled 2020-12-06: qty 2
  Filled 2020-12-06: qty 1

## 2020-12-06 MED ORDER — LABETALOL HCL 5 MG/ML IV SOLN
20.0000 mg | Freq: Once | INTRAVENOUS | Status: AC
  Administered 2020-12-06: 20 mg via INTRAVENOUS
  Filled 2020-12-06: qty 4

## 2020-12-06 MED ORDER — COLESTIPOL HCL 1 G PO TABS
1.0000 g | ORAL_TABLET | Freq: Two times a day (BID) | ORAL | Status: DC
  Administered 2020-12-07 – 2020-12-08 (×2): 1 g via ORAL
  Filled 2020-12-06 (×3): qty 1

## 2020-12-06 MED ORDER — ACETAMINOPHEN 650 MG RE SUPP: 650.0000 mg | RECTAL | Status: DC | PRN

## 2020-12-06 MED ORDER — LABETALOL HCL 5 MG/ML IV SOLN: 5.0000 mg | INTRAVENOUS | Status: DC | PRN

## 2020-12-06 MED ORDER — SODIUM CHLORIDE 0.9 % IV BOLUS
500.0000 mL | Freq: Once | INTRAVENOUS | Status: AC
  Administered 2020-12-06: 500 mL via INTRAVENOUS

## 2020-12-06 MED ORDER — DULOXETINE HCL 60 MG PO CPEP
60.0000 mg | ORAL_CAPSULE | Freq: Every day | ORAL | Status: DC
Start: 2020-12-07 — End: 2020-12-08
  Administered 2020-12-07 – 2020-12-08 (×2): 60 mg via ORAL
  Filled 2020-12-06: qty 1
  Filled 2020-12-06: qty 2

## 2020-12-06 MED ORDER — ASPIRIN EC 81 MG PO TBEC
81.0000 mg | DELAYED_RELEASE_TABLET | Freq: Every day | ORAL | Status: DC
  Administered 2020-12-06 – 2020-12-08 (×3): 81 mg via ORAL
  Filled 2020-12-06 (×3): qty 1

## 2020-12-06 MED ORDER — SULFAMETHOXAZOLE-TRIMETHOPRIM 800-160 MG PO TABS
1.0000 | ORAL_TABLET | Freq: Two times a day (BID) | ORAL | Status: DC
  Administered 2020-12-06 – 2020-12-08 (×5): 1 via ORAL
  Filled 2020-12-06 (×4): qty 1

## 2020-12-06 MED ORDER — FAMOTIDINE 20 MG PO TABS
40.0000 mg | ORAL_TABLET | Freq: Every day | ORAL | Status: DC
  Administered 2020-12-07 – 2020-12-08 (×2): 40 mg via ORAL
  Filled 2020-12-06 (×2): qty 2

## 2020-12-06 MED ORDER — STROKE: EARLY STAGES OF RECOVERY BOOK
Freq: Once | Status: AC
  Filled 2020-12-06: qty 1

## 2020-12-06 MED ORDER — ACETAMINOPHEN 325 MG PO TABS
650.0000 mg | ORAL_TABLET | ORAL | Status: DC | PRN
  Administered 2020-12-07 (×2): 650 mg via ORAL
  Filled 2020-12-06 (×2): qty 2

## 2020-12-06 MED ORDER — ENOXAPARIN SODIUM 40 MG/0.4ML ~~LOC~~ SOLN
40.0000 mg | SUBCUTANEOUS | Status: DC
  Administered 2020-12-06 – 2020-12-07 (×2): 40 mg via SUBCUTANEOUS
  Filled 2020-12-06 (×2): qty 0.4

## 2020-12-06 NOTE — ED Notes (Signed)
Pt. In MRI.

## 2020-12-06 NOTE — ED Triage Notes (Signed)
Pt reports having difficulty articulating her words that began this morning. Pt last known well was last night. Pt reports mild loss of sensation in right upper and lower extremities. No facial droop present. Bilateral equal grips and strength in all extremities. Pt is A&O x3.

## 2020-12-06 NOTE — Progress Notes (Signed)
Please call to let patient know that the lab results are normal including cholesterol and thyroid, labs have been sent to PCP

## 2020-12-06 NOTE — Consult Note (Signed)
Referring Physician: Dr. Roslynn Amble    Chief Complaint: Difficulty articulating words   HPI: [name] [name] is an 85 y.o. female with a PMHx of DM, HLD, HTN and anemia who presented to the [facility] ED with new onset of difficulty with thinking of the correct words to say as well as with articulation, which began on Thursday morning. She had a "blank look" when speaking with her husband and when her son spoke with her by phone "she did not sound normal" and had "trouble finding her words." She was also having some trouble with ambulation, feeling unsteady as well as with dizziness. The symptoms were most prominent for the first 10-20 minutes after they were noticed, then gradually began to improve, but were still present. For example, later when speaking with her daughter she was talking abnormally and asking questions that did not make sense. LKN was Wednesday night. She also reported mild loss of sensation in right hand > left, but this is chronic. She also has chronic distal BLE numbness due to diabetic peripheral neuropathy. Denied lateralized weakness or facial droop. At the time of presentation to the ED, she had normal strength, no facial droop and was alert and oriented x 3. She did have a headache earlier this week, but it is no longer present. She denies vision changes. She has no prior history of stroke. At baseline she walks with a cane.   CT head revealed chronic atrophic and ischemic changes without acute abnormality.  LSN: Wednesday night tPA Given: No: Out of time window.   Past Medical History:  Diagnosis Date   Anemia    Anxiety    Arthritis    Chronic back pain    Chronic diarrhea    Diabetes ([place])    Diverticulosis    GERD (gastroesophageal reflux disease)    Hyperlipidemia    Hypertension    Internal hemorrhoids    UTI (urinary tract infection)     Past Surgical History:  Procedure Laterality Date   ABDOMINAL HYSTERECTOMY     CARDIAC CATHETERIZATION     CATARACT  EXTRACTION, BILATERAL     CHOLECYSTECTOMY     COLONOSCOPY  11/2015   Dr. Britta Mccreedy: hyperplastic polyp from sigmoid colon. biopsies from ascending colon were negative for microscopic colitis.    COLONOSCOPY N/A 03/31/2018   Dr. Gala Romney: Diverticulosis, random colon biopsies negative, hemorrhoids.  No future screening or surveillance colonoscopy is recommended.   TONSILLECTOMY     TRIGGER FINGER RELEASE      Family History  Problem Relation Age of Onset   Heart attack Sister    Heart attack Mother    Diabetes Mother    Heart attack Father    Diabetes Son    Diabetes Maternal Aunt    Rectal cancer Other        anal CA   Colon cancer Neg Hx    Celiac disease Neg Hx    Inflammatory bowel disease Neg Hx    Stomach cancer Neg Hx    Social History:  reports that she has never smoked. She has never used smokeless tobacco. She reports that she does not drink alcohol and does not use drugs.  Allergies: No Known Allergies  Medications:  No current facility-administered medications on file prior to encounter.   Current Outpatient Medications on File Prior to Encounter  Medication Sig Dispense Refill   aspirin EC 81 MG tablet Take 81 mg by mouth daily.     atorvastatin (LIPITOR) 40 MG tablet Take  20 mg by mouth daily.      Calcium Citrate-Vitamin D 200-250 MG-UNIT TABS Take 1 tablet by mouth daily.     colestipol (COLESTID) 1 g tablet TAKE 1 TABLET BY MOUTH TWICE DAILY 60 tablet 1   Cyanocobalamin (B-12) 1000 MCG TABS Take 1,000 mcg by mouth daily.      diclofenac sodium (VOLTAREN) 1 % GEL Apply 2 g topically at bedtime.     DULoxetine (CYMBALTA) 60 MG capsule Take 60 mg by mouth daily.     enalapril (VASOTEC) 20 MG tablet Take 20 mg by mouth daily.     famotidine (PEPCID) 20 MG tablet Take 40 mg by mouth daily.     hydrochlorothiazide (HYDRODIURIL) 25 MG tablet Take 25 mg by mouth every morning.      Insulin Glargine-Lixisenatide (SOLIQUA) 100-33 UNT-MCG/ML SOPN  Takes 22 units with breakfast (Patient taking differently: Inject 18 Units into the skin every morning.) 15 mL 1   labetalol (NORMODYNE) 200 MG tablet Take 200 mg by mouth 2 (two) times daily.     Lactobacillus Rhamnosus, GG, (RA PROBIOTIC DIGESTIVE CARE PO) Take 1 capsule by mouth daily.     magnesium oxide (MAG-OX) 400 MG tablet Take 400 mg by mouth daily.     Multiple Vitamins-Minerals (CENTRUM) tablet Take 1 tablet by mouth daily.     sertraline (ZOLOFT) 50 MG tablet      ACCU-CHEK GUIDE test strip USE AS DIRECTED THREE TIMES DAILY 150 strip 3   COMFORT EZ PEN NEEDLES 31G X 5 MM MISC        Scheduled:  acidophilus   Oral Daily   aspirin EC  81 mg Oral Daily   atorvastatin  20 mg Oral Daily   colestipol  1 g Oral BID   diclofenac sodium  2 g Topical QHS   DULoxetine  60 mg Oral Daily   enoxaparin (LOVENOX) injection  40 mg Subcutaneous Q24H   famotidine  40 mg Oral Daily   magnesium oxide  400 mg Oral Daily   sertraline  50 mg Oral Daily   sulfamethoxazole-trimethoprim  1 tablet Oral Q12H   Continuous:  sodium chloride 100 mL/hr (12/06/20 1832)    ROS: No fever, chills, SOB, CP, N/V or diarrhea. Other ROS as per HPI.   Physical Examination: Blood pressure (!) 230/91, pulse 76, temperature 98.1 F (36.7 C), temperature source Oral, resp. rate (!) 24, SpO2 100 %.  HEENT: [place]/AT Lungs: Respirations unlabored Ext: No edema  Neurologic Examination: Mental Status: Awake with mildly decreased level of alertness. Mild cognitive slowing. Thought content appropriate. Pleasant and cooperative. Speech fluent with intact naming. Comprehension intact - does require repetition for some commands but will perform all commands correctly. Repetition intact. Able to name the months of the year correctly forwards and backwards. Subtle dysarthria. Oriented x 5.  Cranial Nerves: II:  Temporal visual fields intact with no extinction to DSS. Tracks and fixates normally.  III,IV,  VI: Mild right sided ptosis, but son does not see any difference from her baseline. EOMI with subtle saccadic quality to visual pursuits noted. No nystagmus.  V,VII: Decreased temp sensation to left side of face. Smile symmetric. VIII: Hearing mildly impaired to voice IX,X: No hypophonia XI: Symmetric XII: Midline tongue extension Motor: Right : Upper extremity   5/5    Left:     Upper extremity   5/5  Lower extremity   5/5     Lower extremity   5/5 No pronator drift.  No  asterixis.  Fine postural tremor to BUE noted.  Sensory: Temp sensation symmetric to BUE. Distal BLE peripheral neuropathy. FT intact x 4. No extinction to DSS.  Deep Tendon Reflexes:  2+ bilateral brachioradialis and biceps. Unable to elicit patellar reflexes in the context of patient being unable to fully relax legs.  0 achilles bilaterally.  Toes mute bilaterally.  Cerebellar: No ataxia with FNF bilaterally  Gait: Deferred due to falls risk concerns  Results for orders placed or performed during the hospital encounter of 12/06/20 (from the past 48 hour(s))  Ethanol     Status: None   Collection Time: 12/06/20  6:02 PM  Result Value Ref Range   Alcohol, Ethyl (B) <10 <10 mg/dL    Comment: (NOTE) Lowest detectable limit for serum alcohol is 10 mg/dL.  For medical purposes only. Performed at [facility], [place] [address] [place], [place] [address]   Protime-INR     Status: None   Collection Time: 12/06/20  6:02 PM  Result Value Ref Range   Prothrombin Time 12.5 11.4 - 15.2 seconds   INR 1.0 0.8 - 1.2    Comment: (NOTE) INR goal varies based on device and disease states. Performed at [facility], [place] [address] [place], [place] [address]   APTT     Status: None   Collection Time: 12/06/20  6:02 PM  Result Value Ref Range   aPTT 29 24 - 36 seconds    Comment: Performed at [facility], [place] [address], [place], [place] [address]  CBC     Status:  Abnormal   Collection Time: 12/06/20  6:02 PM  Result Value Ref Range   WBC 8.6 4.0 - 10.5 K/uL   RBC 3.98 3.87 - 5.11 MIL/uL   Hemoglobin 11.5 (L) 12.0 - 15.0 g/dL   HCT 35.3 (L) 36.0 - 46.0 %   MCV 88.7 80.0 - 100.0 fL   MCH 28.9 26.0 - 34.0 pg   MCHC 32.6 30.0 - 36.0 g/dL   RDW 13.5 11.5 - 15.5 %   Platelets 206 150 - 400 K/uL   nRBC 0.0 0.0 - 0.2 %    Comment: Performed at [facility], [place] [address] [place], [place] [address]  Differential     Status: None   Collection Time: 12/06/20  6:02 PM  Result Value Ref Range   Neutrophils Relative % 70 %   Neutro Abs 6.1 1.7 - 7.7 K/uL   Lymphocytes Relative 18 %   Lymphs Abs 1.6 0.7 - 4.0 K/uL   Monocytes Relative 9 %   Monocytes Absolute 0.8 0.1 - 1.0 K/uL   Eosinophils Relative 2 %   Eosinophils Absolute 0.1 0.0 - 0.5 K/uL   Basophils Relative 1 %   Basophils Absolute 0.1 0.0 - 0.1 K/uL   Immature Granulocytes 0 %   Abs Immature Granulocytes 0.02 0.00 - 0.07 K/uL    Comment: Performed at [facility], [place] [address] [place],  [address]  Comprehensive metabolic panel     Status: Abnormal   Collection Time: 12/06/20  6:02 PM  Result Value Ref Range   Sodium 140 135 - 145 mmol/L   Potassium 4.2 3.5 - 5.1 mmol/L   Chloride 106 98 - 111 mmol/L   CO2 23 22 - 32 mmol/L   Glucose, Bld 103 (H) 70 - 99 mg/dL    Comment: Glucose reference range applies only to samples taken after fasting for at least  8 hours.   BUN 27 (H) 8 - 23 mg/dL   Creatinine, Ser 0.87 0.44 - 1.00 mg/dL   Calcium 9.8 8.9 - 10.3 mg/dL   Total Protein 7.8 6.5 - 8.1 g/dL   Albumin 4.7 3.5 - 5.0 g/dL   AST 20 15 - 41 U/L   ALT 21 0 - 44 U/L   Alkaline Phosphatase 91 38 - 126 U/L   Total Bilirubin 0.6 0.3 - 1.2 mg/dL   GFR, Estimated >60 >60 mL/min    Comment: (NOTE) Calculated using the CKD-EPI Creatinine Equation (2021)    Anion gap 11 5 - 15    Comment: Performed at [facility], [place]  [address], [place], [place] [address]  Ginger Carne 8, ED     Status: Abnormal   Collection Time: 12/06/20  6:23 PM  Result Value Ref Range   Sodium 141 135 - 145 mmol/L   Potassium 4.2 3.5 - 5.1 mmol/L   Chloride 105 98 - 111 mmol/L   BUN 28 (H) 8 - 23 mg/dL   Creatinine, Ser 0.90 0.44 - 1.00 mg/dL   Glucose, Bld 101 (H) 70 - 99 mg/dL    Comment: Glucose reference range applies only to samples taken after fasting for at least 8 hours.   Calcium, Ion 1.28 1.15 - 1.40 mmol/L   TCO2 26 22 - 32 mmol/L   Hemoglobin 11.9 (L) 12.0 - 15.0 g/dL   HCT 35.0 (L) 36.0 - 46.0 %  Urine rapid drug screen (hosp performed)     Status: None   Collection Time: 12/06/20  6:25 PM  Result Value Ref Range   Opiates NONE DETECTED NONE DETECTED   Cocaine NONE DETECTED NONE DETECTED   Benzodiazepines NONE DETECTED NONE DETECTED   Amphetamines NONE DETECTED NONE DETECTED   Tetrahydrocannabinol NONE DETECTED NONE DETECTED   Barbiturates NONE DETECTED NONE DETECTED    Comment: (NOTE) DRUG SCREEN FOR MEDICAL PURPOSES ONLY.  IF CONFIRMATION IS NEEDED FOR ANY PURPOSE, NOTIFY LAB WITHIN 5 DAYS.  LOWEST DETECTABLE LIMITS FOR URINE DRUG SCREEN Drug Class                     Cutoff (ng/mL) Amphetamine and metabolites    1000 Barbiturate and metabolites    200 Benzodiazepine                 944 Tricyclics and metabolites     300 Opiates and metabolites        300 Cocaine and metabolites        300 THC                            50 Performed at [facility], [place] [address], [place], [place] [address]   Urinalysis, Routine w reflex microscopic Urine, Clean Catch     Status: Abnormal   Collection Time: 12/06/20  6:25 PM  Result Value Ref Range   Color, Urine STRAW (A) YELLOW   APPearance HAZY (A) CLEAR   Specific Gravity, Urine 1.009 1.005 - 1.030   pH 5.0 5.0 - 8.0   Glucose, UA NEGATIVE NEGATIVE mg/dL   Hgb urine dipstick SMALL (A) NEGATIVE   Bilirubin Urine NEGATIVE NEGATIVE    Ketones, ur NEGATIVE NEGATIVE mg/dL   Protein, ur 100 (A) NEGATIVE mg/dL   Nitrite NEGATIVE NEGATIVE   Leukocytes,Ua TRACE (A) NEGATIVE   RBC / HPF 0-5 0 - 5 RBC/hpf  WBC, UA 11-20 0 - 5 WBC/hpf   Bacteria, UA MANY (A) NONE SEEN   Hyaline Casts, UA PRESENT     Comment: Performed at [facility], [place] [address] [place], [place] [address]  Resp Panel by RT-PCR (Flu A&B, Covid) Nasopharyngeal Swab     Status: None   Collection Time: 12/06/20  7:00 PM   Specimen: Nasopharyngeal Swab; Nasopharyngeal(NP) swabs in vial transport medium  Result Value Ref Range   SARS Coronavirus 2 by RT PCR NEGATIVE NEGATIVE    Comment: (NOTE) SARS-CoV-2 target nucleic acids are NOT DETECTED.  The SARS-CoV-2 RNA is generally detectable in upper respiratory specimens during the acute phase of infection. The lowest concentration of SARS-CoV-2 viral copies this assay can detect is 138 copies/mL. A negative result does not preclude SARS-Cov-2 infection and should not be used as the sole basis for treatment or other patient management decisions. A negative result may occur with  improper specimen collection/handling, submission of specimen other than nasopharyngeal swab, presence of viral mutation(s) within the areas targeted by this assay, and inadequate number of viral copies(<138 copies/mL). A negative result must be combined with clinical observations, patient history, and epidemiological information. The expected result is Negative.  Fact Sheet for Patients:  EntrepreneurPulse.com.au  Fact Sheet for Healthcare Providers:  IncredibleEmployment.be  This test is no t yet approved or cleared by the [place] FDA and  has been authorized for detection and/or diagnosis of SARS-CoV-2 by FDA under an Emergency Use Authorization (EUA). This EUA will remain  in effect (meaning this test can be used) for the duration of the COVID-19 declaration under  Section 564(b)(1) of the Act, 21 U.S.C.section 360bbb-3(b)(1), unless the authorization is terminated  or revoked sooner.       Influenza A by PCR NEGATIVE NEGATIVE   Influenza B by PCR NEGATIVE NEGATIVE    Comment: (NOTE) The Xpert Xpress SARS-CoV-2/FLU/RSV plus assay is intended as an aid in the diagnosis of influenza from Nasopharyngeal swab specimens and should not be used as a sole basis for treatment. Nasal washings and aspirates are unacceptable for Xpert Xpress SARS-CoV-2/FLU/RSV testing.  Fact Sheet for Patients: EntrepreneurPulse.com.au  Fact Sheet for Healthcare Providers: IncredibleEmployment.be  This test is not yet approved or cleared by the [place] FDA and has been authorized for detection and/or diagnosis of SARS-CoV-2 by FDA under an Emergency Use Authorization (EUA). This EUA will remain in effect (meaning this test can be used) for the duration of the COVID-19 declaration under Section 564(b)(1) of the Act, 21 U.S.C. section 360bbb-3(b)(1), unless the authorization is terminated or revoked.  Performed at [facility], [place] [address], [place], [place] [address]    CT HEAD WO CONTRAST  Result Date: 12/06/2020 CLINICAL DATA:  Difficulty speaking EXAM: CT HEAD WITHOUT CONTRAST TECHNIQUE: Contiguous axial images were obtained from the base of the skull through the vertex without intravenous contrast. COMPARISON:  10/15/2017 FINDINGS: Brain: No evidence of acute infarction, hemorrhage, hydrocephalus, extra-axial collection or mass lesion/mass effect.Mild atrophic changes and chronic white matter ischemic changes are again seen and stable. Vascular: No hyperdense vessel or unexpected calcification. Skull: Normal. Negative for fracture or focal lesion. Sinuses/Orbits: No acute finding. Other: None. IMPRESSION: Chronic atrophic and ischemic changes without acute abnormality. Electronically Signed   By: Inez Catalina M.D.   On: 12/06/2020 19:39    Assessment: 85 y.o. female presenting with abnormal speech and confusion 1. Exam reveals no focal weakness, no lateralized sensory loss and no  aphasia. DDx for her presentation includes confusion in the setting of volume depletion (elevated BUN/Cr ratio) as well as left MCA territory TIA.  2. CT head: Chronic atrophic and ischemic changes without acute abnormality. 3. MRI brain: No acute intracranial abnormality. Generalized volume loss and findings of chronic small vessel disease. 4. Stroke Risk Factors - DM, HLD and HTN   Recommendations: 1. HgbA1c, fasting lipid panel 2. CTA of head and neck 3. PT consult, OT consult, Speech consult 4. Echocardiogram 5. Prophylactic therapy- Continue ASA. May need to add Plavix pending completion of stroke work up 6. Risk factor modification 7. Telemetry monitoring 8. Frequent neuro checks 9. BP management. Out of permissive HTN time window.   @Electronically  signed: Dr. Kerney Elbe  12/06/2020, 8:48 PM

## 2020-12-06 NOTE — H&P (Signed)
History and Physical   DAVITA SUBLETT EXH:[id number] DOB: [birth year] DOA: 12/06/2020  PCP: [facility]   Patient coming from: Home  Chief Complaint: Dysarthria, decreased sensation  HPI: [name] [name] is a 85 y.o. female with medical history significant of anemia, chronic diarrhea, hypertension, GERD, hyperlipidemia, anxiety, depression, diabetes, diverticulosis who presents after episode of difficulty articulating her words and decrease in station this morning.  Patient was last known normal last night.  This morning she was having difficulty getting the right words out and her husband was having a difficult time understanding her.  Suspicious for expressive aphasia.  This lasted for about 10 to 20 minutes.  Family states that later when speaking with her daughter she was talking abnormally and asking questions that did not make sense.  She also reports some decreased sensation in both of her hands, right greater than left, but states this has occurred in the past.  No numbness other than her chronic diabetic neuropathy when seen.   She denies any focal weakness, comment it does states she is getting gradually weaker with age. She reports that she has noticed some discomfort when urinating recently as well.  She denies fevers, chills, chest pain, shortness breath, abdominal pain, constipation, diarrhea, nausea, vomiting.  ED Course: Vital signs in the ED are significant for elevated blood pressure in the 381O to 175 systolic.  Lab work-up showed CMP with BUN of 27 which is currently elevated, otherwise within normal limits.  CBC showed hemoglobin of 11.5 which is stable otherwise normal limits.  PT, PTT, INR within normal limits.  Ethanol level negative, UDS negative, urinalysis showed protein, leukocytes, white blood cells, bacteria.  Respiratory flu COVID negative.  Imaging showed CT head with chronic atrophy and age and ischemic changes without acute abnormality.  Neurology was consulted  in the ED and recommended TIA evaluation at [facility].  Review of Systems: As per HPI otherwise all other systems reviewed and are negative.  Past Medical History:  Diagnosis Date  . Anemia   . Anxiety   . Arthritis   . Chronic back pain   . Chronic diarrhea   . Diabetes ([place])   . Diverticulosis   . GERD (gastroesophageal reflux disease)   . Hyperlipidemia   . Hypertension   . Internal hemorrhoids   . UTI (urinary tract infection)     Past Surgical History:  Procedure Laterality Date  . ABDOMINAL HYSTERECTOMY    . CARDIAC CATHETERIZATION    . CATARACT EXTRACTION, BILATERAL    . CHOLECYSTECTOMY    . COLONOSCOPY  11/2015   Dr. Britta Mccreedy: hyperplastic polyp from sigmoid colon. biopsies from ascending colon were negative for microscopic colitis.   . COLONOSCOPY N/A 03/31/2018   Dr. Gala Romney: Diverticulosis, random colon biopsies negative, hemorrhoids.  No future screening or surveillance colonoscopy is recommended.  . TONSILLECTOMY    . TRIGGER FINGER RELEASE      Social History  reports that she has never smoked. She has never used smokeless tobacco. She reports that she does not drink alcohol and does not use drugs.  No Known Allergies  Family History  Problem Relation Age of Onset  . Heart attack Sister   . Heart attack Mother   . Diabetes Mother   . Heart attack Father   . Diabetes Son   . Diabetes Maternal Aunt   . Rectal cancer Other        anal CA  . Colon cancer Neg Hx   . Celiac  disease Neg Hx   . Inflammatory bowel disease Neg Hx   . Stomach cancer Neg Hx   Reviewed on admission  Prior to Admission medications   Medication Sig Start Date End Date Taking? Authorizing Provider  aspirin EC 81 MG tablet Take 81 mg by mouth daily.   Yes [provider]  atorvastatin (LIPITOR) 40 MG tablet Take 20 mg by mouth daily.    Yes [provider]  Calcium Citrate-Vitamin D 200-250 MG-UNIT TABS Take 1 tablet by mouth daily.   Yes [provider]   colestipol (COLESTID) 1 g tablet TAKE 1 TABLET BY MOUTH TWICE DAILY 10/16/20  Yes Thornton Park, MD  Cyanocobalamin (B-12) 1000 MCG TABS Take 1,000 mcg by mouth daily.    Yes [provider]  diclofenac sodium (VOLTAREN) 1 % GEL Apply 2 g topically at bedtime.   Yes [provider]  DULoxetine (CYMBALTA) 60 MG capsule Take 60 mg by mouth daily.   Yes [provider]  enalapril (VASOTEC) 20 MG tablet Take 20 mg by mouth daily.   Yes [provider]  famotidine (PEPCID) 20 MG tablet Take 40 mg by mouth daily. 11/20/20  Yes [provider]  hydrochlorothiazide (HYDRODIURIL) 25 MG tablet Take 25 mg by mouth every morning.    Yes [provider]  Insulin Glargine-Lixisenatide (SOLIQUA) 100-33 UNT-MCG/ML SOPN Takes 22 units with breakfast Patient taking differently: Inject 18 Units into the skin every morning. 08/14/20  Yes Elayne Snare, MD  labetalol (NORMODYNE) 200 MG tablet Take 200 mg by mouth 2 (two) times daily.   Yes [provider]  Lactobacillus Rhamnosus, GG, (RA PROBIOTIC DIGESTIVE CARE PO) Take 1 capsule by mouth daily.   Yes [provider]  magnesium oxide (MAG-OX) 400 MG tablet Take 400 mg by mouth daily.   Yes [provider]  Multiple Vitamins-Minerals (CENTRUM) tablet Take 1 tablet by mouth daily.   Yes [provider]  sertraline (ZOLOFT) 50 MG tablet  08/27/20  Yes [provider]  ACCU-CHEK GUIDE test strip USE AS DIRECTED THREE TIMES DAILY 07/17/20   Elayne Snare, MD  COMFORT EZ PEN NEEDLES 31G X 5 MM [place]  08/22/20   [provider]    Physical Exam: Vitals:   12/06/20 1930 12/06/20 2030 12/06/20 2100 12/06/20 2130  BP: (!) 226/90 (!) 230/91 (!) 210/84 (!) 242/98  Pulse: 76 76 79 84  Resp: (!) 22 (!) 24 (!) 22 20  Temp:      TempSrc:      SpO2: 96% 100% 100% 99%   Physical Exam Constitutional:      General: She is not in acute distress.    Appearance: Normal  appearance.  HENT:     Head: Normocephalic and atraumatic.     Mouth/Throat:     Mouth: Mucous membranes are moist.     Pharynx: Oropharynx is clear.  Eyes:     Extraocular Movements: Extraocular movements intact.     Pupils: Pupils are equal, round, and reactive to light.  Cardiovascular:     Rate and Rhythm: Normal rate and regular rhythm.     Pulses: Normal pulses.     Heart sounds: Normal heart sounds.  Pulmonary:     Effort: Pulmonary effort is normal. No respiratory distress.     Breath sounds: Normal breath sounds.  Abdominal:     General: Bowel sounds are normal. There is no distension.     Palpations: Abdomen is soft.  Tenderness: There is no abdominal tenderness.  Musculoskeletal:        General: No swelling or deformity.  Skin:    General: Skin is warm and dry.  Neurological:     General: No focal deficit present.     Mental Status: Mental status is at baseline.     Comments: Mental Status: Patient is awake, alert, oriented x3 No signs of aphasia or neglect Cranial Nerves: II: Pupils equal, round, and reactive to light.  III,IV, VI: EOMI without ptosis or diploplia.  V: Facial sensation is symmetric tolight touch VII: Facial movement is symmetric.  VIII: hearing is intact to voice X: Uvula elevates symmetrically XI: Shoulder shrug is symmetric. XII: tongue is midline without atrophy or fasciculations.  Motor: Good effort thorughout, at Least 5/5 bilateral UE, 5/5 bilateral lower extremitiy Sensory: Sensation is grossly intact bilateral UEs & LEs Cerebellar: Finger-Nose intact but more sluggish on the right.    Labs on Admission: I have personally reviewed following labs and imaging studies  CBC: Recent Labs  Lab 12/06/20 1802 12/06/20 1823  WBC 8.6  --   NEUTROABS 6.1  --   HGB 11.5* 11.9*  HCT 35.3* 35.0*  MCV 88.7  --   PLT 206  --     Basic Metabolic Panel: Recent Labs  Lab 12/05/20 1100 12/06/20 1802 12/06/20 1823  NA 138 140 141   K 4.4 4.2 4.2  CL 106 106 105  CO2 26 23  --   GLUCOSE 97 103* 101*  BUN 30* 27* 28*  CREATININE 1.13 0.87 0.90  CALCIUM 9.6 9.8  --     GFR: Estimated Creatinine Clearance: 42 mL/min (by C-G formula based on SCr of 0.9 mg/dL).  Liver Function Tests: Recent Labs  Lab 12/05/20 1100 12/06/20 1802  AST 19 20  ALT 18 21  ALKPHOS 77 91  BILITOT 0.5 0.6  PROT 6.9 7.8  ALBUMIN 4.0 4.7    Urine analysis:    Component Value Date/Time   COLORURINE STRAW (A) 12/06/2020 1825   APPEARANCEUR HAZY (A) 12/06/2020 1825   LABSPEC 1.009 12/06/2020 1825   PHURINE 5.0 12/06/2020 1825   GLUCOSEU NEGATIVE 12/06/2020 1825   GLUCOSEU NEGATIVE 11/16/2018 1219   HGBUR SMALL (A) 12/06/2020 1825   BILIRUBINUR NEGATIVE 12/06/2020 1825   KETONESUR NEGATIVE 12/06/2020 1825   PROTEINUR 100 (A) 12/06/2020 1825   UROBILINOGEN 0.2 11/16/2018 1219   NITRITE NEGATIVE 12/06/2020 1825   LEUKOCYTESUR TRACE (A) 12/06/2020 1825    Radiological Exams on Admission: CT HEAD WO CONTRAST  Result Date: 12/06/2020 CLINICAL DATA:  Difficulty speaking EXAM: CT HEAD WITHOUT CONTRAST TECHNIQUE: Contiguous axial images were obtained from the base of the skull through the vertex without intravenous contrast. COMPARISON:  10/15/2017 FINDINGS: Brain: No evidence of acute infarction, hemorrhage, hydrocephalus, extra-axial collection or mass lesion/mass effect.Mild atrophic changes and chronic white matter ischemic changes are again seen and stable. Vascular: No hyperdense vessel or unexpected calcification. Skull: Normal. Negative for fracture or focal lesion. Sinuses/Orbits: No acute finding. Other: None. IMPRESSION: Chronic atrophic and ischemic changes without acute abnormality. Electronically Signed   By: Inez Catalina M.D.   On: 12/06/2020 19:39    EKG: Independently reviewed.  Sinus rhythm at 74 bpm.  Possible Q waves in 3 and aVF.  Similar to previous.  Assessment/Plan Principal Problem:   TIA (transient ischemic  attack) Active Problems:   Essential hypertension   Hyperlipidemia   GERD (gastroesophageal reflux disease)   UTI (urinary tract infection)  Anemia  TIA Hyperlipidemia > Patient presented after word finding difficulty this morning and some transient bilateral numbness right greater than left. > Symptoms have resolved at this time. > CT head without acute abnormality, presentation is suspicious for TIA, neurology has been consulted and have accepted patient for transfer for work-up at [facility]. > Has history of hypertension, diabetes, hyperlipidemia, family history of TIAs in mother and sister. > I did have to repeat myself on a couple of questions, her son states this is been the case for several people today and this is not typical for her. - Transfer to [place] neurology recommendations - Allow for permissive HTN (systolic < 998 and diastolic < 338)  - Continue home aspirin  - Continue home statin and PSK 9 inhibitor  - Echocardiogram  - Carotid doppler  - A1C  - Lipid panel  - Tele monitoring  - SLP eval - PT/OT  Hypertension > Significant hypertension in ED as high as 250 systolic, are allowing for permissive hypertension in the setting of TIA.  If TIA work-up is completely unrevealing or symptoms recur in the setting of severely elevated blood pressure may need to consider hypertension as a contributor. - Labetalol as needed for blood pressure greater than 539 systolic - Holding home antihypertensives  UTI > Urinalysis with protein, leukocytes, white blood cells, bacteria.  Patient reporting dysuria. > Family reports a history of confusion with prior UTI. - P.o. Bactrim, UTI in 2019 was sensitive to this - Follow-up urine culture  Anemia - Hemoglobin stable at 11.5 in ED - Continue to monitor  GERD - Continue home Pepcid  Anxiety and depression -Continue home duloxetine and sertraline  Diabetes > Takes 18 units in the morning at  home - 12 units daily - SSI  DVT prophylaxis: Lovenox  Code Status:   DNR Family Communication:  Discussed with son at bedside. Disposition Plan:   Patient is from:  Home  Anticipated DC to:  Home  Anticipated DC date:  1 to 2 days  Anticipated DC barriers: None  Consults called:  Neurology Dr. Cheral Marker consulted by EDP, patient accepted to [facility]  Admission status:  Observation, telemetry   Severity of Illness: The appropriate patient status for this patient is OBSERVATION. Observation status is judged to be reasonable and necessary in order to provide the required intensity of service to ensure the patient's safety. The patient's presenting symptoms, physical exam findings, and initial radiographic and laboratory data in the context of their medical condition is felt to place them at decreased risk for further clinical deterioration. Furthermore, it is anticipated that the patient will be medically stable for discharge from the hospital within 2 midnights of admission. The following factors support the patient status of observation.   " The patient's presenting symptoms include word finding difficulty, decreased sensation. " The physical exam findings include stable exam findings when seen other than somewhat sluggish right-sided finger-nose. " The initial radiographic and laboratory data are urinalysis consistent with UTI, stable anemia, stable elevated BUN.  CTA with chronic atrophy and ischemic changes but nothing acute.Marcelyn Bruins MD [facility]  How to contact the [facility] Attending or Consulting provider [place] or covering provider during after hours [place], for this patient?   1. Check the care team in [facility] and look for a) attending/consulting TR[facility] provider listed and b) the [facility] team listed 2. Log into [email or web address] and use [place]'s universal password to  access. If you do not have the password, please contact the hospital operator. 3. Locate the [facility] provider  you are looking for under [facility] and page to a number that you can be directly reached. 4. If you still have difficulty reaching the provider, please page the [facility] (Director on Call) for the Hospitalists listed on amion for assistance.  12/06/2020, 9:51 PM

## 2020-12-06 NOTE — ED Provider Notes (Signed)
[facility] DEPT Provider Note   CSN: [id number] Arrival date & time: 12/06/20  1737     History Chief Complaint  Patient presents with  . Aphasia    [name] [name] is a 85 y.o. female.  The history is provided by the patient and medical records. No language interpreter was used.     85 year old female significant history of diabetes, anemia, anxiety, hypertension presented to the ED with strokelike symptoms.  Patient report this morning she noticed that she does not feel well.  She recall having trouble articulating her words and her husband was having a difficult time understanding her.  She reports she was at her baseline last night.  She noticed that her speech did improve throughout the day.  She does report having decrease sensation to both of her hands but this is not new.  She denies any focal weakness.  She reported having headache earlier in the week but no headache currently.  She denies any change in her vision.  She does endorse some urinary discomfort for the past several days.  She denies any neck pain chest pain trouble breathing abdominal pain fever chills.  No prior stroke.  She is not on any blood thinner medication.  She has had COVID vaccination.  Patient walks with a cane, denies any change in her amatory status Past Medical History:  Diagnosis Date  . Anemia   . Anxiety   . Arthritis   . Chronic back pain   . Chronic diarrhea   . Diabetes ([place])   . Diverticulosis   . GERD (gastroesophageal reflux disease)   . Hyperlipidemia   . Hypertension   . Internal hemorrhoids   . UTI (urinary tract infection)     Patient Active Problem List   Diagnosis Date Noted  . Microalbuminuria 02/23/2019  . Anemia 01/12/2018  . Hyperlipidemia 10/16/2017  . GERD (gastroesophageal reflux disease) 10/16/2017  . UTI (urinary tract infection) 10/16/2017  . Hypokalemia 10/16/2017  . Hypomagnesemia 10/16/2017  . Acute metabolic encephalopathy  67/08/4579  . Chronic diarrhea 08/26/2017  . Essential hypertension 02/20/2016  . Dyspnea 02/19/2016    Past Surgical History:  Procedure Laterality Date  . ABDOMINAL HYSTERECTOMY    . CARDIAC CATHETERIZATION    . CATARACT EXTRACTION, BILATERAL    . CHOLECYSTECTOMY    . COLONOSCOPY  11/2015   Dr. Britta Mccreedy: hyperplastic polyp from sigmoid colon. biopsies from ascending colon were negative for microscopic colitis.   . COLONOSCOPY N/A 03/31/2018   Dr. Gala Romney: Diverticulosis, random colon biopsies negative, hemorrhoids.  No future screening or surveillance colonoscopy is recommended.  . TONSILLECTOMY    . TRIGGER FINGER RELEASE       OB History   No obstetric history on file.     Family History  Problem Relation Age of Onset  . Heart attack Sister   . Heart attack Mother   . Diabetes Mother   . Heart attack Father   . Diabetes Son   . Diabetes Maternal Aunt   . Rectal cancer Other        anal CA  . Colon cancer Neg Hx   . Celiac disease Neg Hx   . Inflammatory bowel disease Neg Hx   . Stomach cancer Neg Hx     Social History   Tobacco Use  . Smoking status: Never Smoker  . Smokeless tobacco: Never Used  Vaping Use  . Vaping Use: Never used  Substance Use Topics  . Alcohol use: No  Alcohol/week: 0.0 standard drinks  . Drug use: No    Home Medications Prior to Admission medications   Medication Sig Start Date End Date Taking? Authorizing Provider  ACCU-CHEK GUIDE test strip USE AS DIRECTED THREE TIMES DAILY 07/17/20   Elayne Snare, MD  aspirin EC 81 MG tablet Take 81 mg by mouth daily.    [provider]  atorvastatin (LIPITOR) 40 MG tablet Take 20 mg by mouth daily.     [provider]  Calcium Citrate-Vitamin D 200-250 MG-UNIT TABS Take 1 tablet by mouth daily.    [provider]  colestipol (COLESTID) 1 g tablet TAKE 1 TABLET BY MOUTH TWICE DAILY 10/16/20   Thornton Park, MD  COMFORT EZ PEN NEEDLES 31G X 5 MM [place]  08/22/20    [provider]  Cyanocobalamin (B-12) 1000 MCG TABS Take 1,000 mcg by mouth daily.     [provider]  diclofenac sodium (VOLTAREN) 1 % GEL Apply 2 g topically at bedtime.    [provider]  DULoxetine (CYMBALTA) 60 MG capsule Take 60 mg by mouth daily.    [provider]  enalapril (VASOTEC) 20 MG tablet Take 20 mg by mouth daily.    [provider]  famotidine (PEPCID) 40 MG tablet Take 40 mg by mouth daily.    [provider]  hydrochlorothiazide (HYDRODIURIL) 25 MG tablet Take 25 mg by mouth every morning.     [provider]  Insulin Glargine-Lixisenatide (SOLIQUA) 100-33 UNT-MCG/ML SOPN Takes 22 units with breakfast Patient taking differently: Takes 18 units with breakfast 08/14/20   Elayne Snare, MD  labetalol (NORMODYNE) 200 MG tablet Take 200 mg by mouth daily.     [provider]  Lactobacillus Rhamnosus, GG, (RA PROBIOTIC DIGESTIVE CARE PO) Take 1 capsule by mouth daily.    [provider]  magnesium oxide (MAG-OX) 400 MG tablet Take 400 mg by mouth daily.    [provider]  Multiple Vitamins-Minerals (CENTRUM) tablet Take 1 tablet by mouth daily.    [provider]  sertraline (ZOLOFT) 50 MG tablet  08/27/20   [provider]    Allergies    Patient has no known allergies.  Review of Systems   Review of Systems  All other systems reviewed and are negative.   Physical Exam Updated Vital Signs BP (!) 173/104 (BP Location: Right Arm)   Pulse 82   Temp 98.1 F (36.7 C) (Oral)   Resp 16   SpO2 100%   Physical Exam Vitals and nursing note reviewed.  Constitutional:      General: She is not in acute distress.    Appearance: She is well-developed.  HENT:     Head: Atraumatic.  Eyes:     Extraocular Movements: Extraocular movements intact.     Conjunctiva/sclera: Conjunctivae normal.     Pupils: Pupils are equal, round, and reactive to light.  Cardiovascular:      Rate and Rhythm: Normal rate and regular rhythm.     Pulses: Normal pulses.     Heart sounds: Normal heart sounds.  Pulmonary:     Effort: Pulmonary effort is normal.     Breath sounds: Normal breath sounds. No wheezing, rhonchi or rales.  Abdominal:     Palpations: Abdomen is soft.     Tenderness: There is no abdominal tenderness.  Musculoskeletal:     Cervical back: Normal range of motion and neck supple.  Skin:    Findings: No rash.  Neurological:  Mental Status: She is alert and oriented to person, place, and time.     GCS: GCS eye subscore is 4. GCS verbal subscore is 5. GCS motor subscore is 6.     Cranial Nerves: Cranial nerves are intact.     Sensory: Sensation is intact.     Motor: Motor function is intact.     Coordination: Coordination is intact.     Gait: Gait is intact.     Comments: Speech is mildly delay but clear without slurring of words.     ED Results / Procedures / Treatments   Labs (all labs ordered are listed, but only abnormal results are displayed) Labs Reviewed  CBC - Abnormal; Notable for the following components:      Result Value   Hemoglobin 11.5 (*)    HCT 35.3 (*)    All other components within normal limits  COMPREHENSIVE METABOLIC PANEL - Abnormal; Notable for the following components:   Glucose, Bld 103 (*)    BUN 27 (*)    All other components within normal limits  URINALYSIS, ROUTINE W REFLEX MICROSCOPIC - Abnormal; Notable for the following components:   Color, Urine STRAW (*)    APPearance HAZY (*)    Hgb urine dipstick SMALL (*)    Protein, ur 100 (*)    Leukocytes,Ua TRACE (*)    Bacteria, UA MANY (*)    All other components within normal limits  I-STAT CHEM 8, ED - Abnormal; Notable for the following components:   BUN 28 (*)    Glucose, Bld 101 (*)    Hemoglobin 11.9 (*)    HCT 35.0 (*)    All other components within normal limits  RESP PANEL BY RT-PCR (FLU A&B, COVID) ARPGX2  ETHANOL  PROTIME-INR  APTT   DIFFERENTIAL  RAPID URINE DRUG SCREEN, HOSP PERFORMED    EKG EKG Interpretation  Date/Time:  Thursday December 06 2020 18:34:18 EST Ventricular Rate:  74 PR Interval:    QRS Duration: 96 QT Interval:  407 QTC Calculation: 452 R Axis:   -39 Text Interpretation: Sinus rhythm Abnormal R-wave progression, early transition Left ventricular hypertrophy Inferior infarct, old Anterior Q waves, possibly due to LVH Confirmed by Madalyn Rob [phone]) on 12/06/2020 8:24:09 PM   Radiology CT HEAD WO CONTRAST  Result Date: 12/06/2020 CLINICAL DATA:  Difficulty speaking EXAM: CT HEAD WITHOUT CONTRAST TECHNIQUE: Contiguous axial images were obtained from the base of the skull through the vertex without intravenous contrast. COMPARISON:  10/15/2017 FINDINGS: Brain: No evidence of acute infarction, hemorrhage, hydrocephalus, extra-axial collection or mass lesion/mass effect.Mild atrophic changes and chronic white matter ischemic changes are again seen and stable. Vascular: No hyperdense vessel or unexpected calcification. Skull: Normal. Negative for fracture or focal lesion. Sinuses/Orbits: No acute finding. Other: None. IMPRESSION: Chronic atrophic and ischemic changes without acute abnormality. Electronically Signed   By: Inez Catalina M.D.   On: 12/06/2020 19:39    Procedures Procedures   Medications Ordered in ED Medications  sodium chloride 0.9 % bolus 500 mL (0 mLs Intravenous Stopped 12/06/20 2047)    Followed by  0.9 %  sodium chloride infusion (100 mL/hr Intravenous New Bag/Given 12/06/20 1832)  labetalol (NORMODYNE) injection 20 mg (20 mg Intravenous Given 12/06/20 2046)    ED Course  I have reviewed the triage vital signs and the nursing notes.  Pertinent labs & imaging results that were available during my care of the patient were reviewed by me and considered in my medical decision making (  see chart for details).    MDM Rules/Calculators/A&P                          BP (!) 210/84    Pulse 79   Temp 98.1 F (36.7 C) (Oral)   Resp (!) 22   SpO2 100%   Final Clinical Impression(s) / ED Diagnoses Final diagnoses:  TIA (transient ischemic attack)  Hypertensive urgency    Rx / DC Orders ED Discharge Orders    None     6:13 PM Patient here with complaints of some dysarthria earlier this morning with improvement.  She also endorsed some urinary discomfort as well for the past few days.  She has history of UTI.  She denies any prior history of stroke.  She does have symptoms suggestive of TIA/stroke but outside of the window for TPA.  No obvious focal neuro deficit on exam.  She is alert and oriented x4.  9:10 PM Labs are reassuring, head CT scan without acute abnormalities.  Patient was hypertensive today with systolic blood pressure around 230, labetalol given, appreciate consultation with on-call neurologist, Dr. Cheral Marker, who agrees patient can be admitted for TIA work-up and can be transferred to [facility].  Will consult medicine for admission.  Care discussed with Dr. Roslynn Amble.  9:33 PM I have consulted with [facility] Dr. Trilby Drummer who agrees to see and will help aid with admission to [facility] [address] for TIA work up.  Pt is ware of plan and agrees with plan.    Domenic Moras, PA-C 12/06/20 2134    Lucrezia Starch, MD 12/07/20 [phone]

## 2020-12-06 NOTE — ED Notes (Signed)
MD at bedside. 

## 2020-12-07 ENCOUNTER — Telehealth: Payer: Self-pay

## 2020-12-07 ENCOUNTER — Observation Stay (HOSPITAL_BASED_OUTPATIENT_CLINIC_OR_DEPARTMENT_OTHER): Payer: Medicare PPO

## 2020-12-07 ENCOUNTER — Observation Stay (HOSPITAL_COMMUNITY): Payer: Medicare PPO

## 2020-12-07 DIAGNOSIS — I6522 Occlusion and stenosis of left carotid artery: Secondary | ICD-10-CM | POA: Diagnosis not present

## 2020-12-07 DIAGNOSIS — I771 Stricture of artery: Secondary | ICD-10-CM | POA: Diagnosis not present

## 2020-12-07 DIAGNOSIS — G459 Transient cerebral ischemic attack, unspecified: Secondary | ICD-10-CM

## 2020-12-07 DIAGNOSIS — E785 Hyperlipidemia, unspecified: Secondary | ICD-10-CM

## 2020-12-07 DIAGNOSIS — I672 Cerebral atherosclerosis: Secondary | ICD-10-CM | POA: Diagnosis not present

## 2020-12-07 DIAGNOSIS — R29818 Other symptoms and signs involving the nervous system: Secondary | ICD-10-CM | POA: Diagnosis not present

## 2020-12-07 LAB — GLUCOSE, CAPILLARY
Glucose-Capillary: 128 mg/dL — ABNORMAL HIGH (ref 70–99)
Glucose-Capillary: 123 mg/dL — ABNORMAL HIGH (ref 70–99)

## 2020-12-07 LAB — ECHOCARDIOGRAM COMPLETE
Area-P 1/2: 2.73 cm2
S' Lateral: 2.8 cm

## 2020-12-07 LAB — LIPID PANEL
Cholesterol: 114 mg/dL (ref 0–200)
HDL: 49 mg/dL (ref >40)
LDL Cholesterol: 39 mg/dL (ref 0–99)
Total CHOL/HDL Ratio: 2.3 RATIO
Triglycerides: 129 mg/dL (ref <150)
VLDL: 26 mg/dL (ref 0–40)

## 2020-12-07 LAB — HEMOGLOBIN A1C
Hgb A1c MFr Bld: 7.4 % — ABNORMAL HIGH (ref 4.8–5.6)
Mean Plasma Glucose: 165.68 mg/dL

## 2020-12-07 MED ORDER — LABETALOL HCL 5 MG/ML IV SOLN
5.0000 mg | INTRAVENOUS | Status: DC | PRN
  Administered 2020-12-07 – 2020-12-08 (×2): 5 mg via INTRAVENOUS
  Filled 2020-12-07 (×2): qty 4

## 2020-12-07 MED ORDER — ADULT MULTIVITAMIN W/MINERALS CH
1.0000 | ORAL_TABLET | Freq: Every day | ORAL | Status: DC
  Administered 2020-12-08: 1 via ORAL
  Filled 2020-12-07: qty 1

## 2020-12-07 MED ORDER — INSULIN ASPART 100 UNIT/ML ~~LOC~~ SOLN: 0.0000 [IU] | Freq: Every day | SUBCUTANEOUS | Status: DC

## 2020-12-07 MED ORDER — CALCIUM CARBONATE-VITAMIN D 500-200 MG-UNIT PO TABS
1.0000 | ORAL_TABLET | Freq: Every day | ORAL | Status: DC
  Administered 2020-12-08: 09:00:00 1 via ORAL
  Filled 2020-12-07: qty 1

## 2020-12-07 MED ORDER — HYDROCHLOROTHIAZIDE 25 MG PO TABS
25.0000 mg | ORAL_TABLET | Freq: Every morning | ORAL | Status: DC
  Administered 2020-12-08: 25 mg via ORAL
  Filled 2020-12-07: qty 1

## 2020-12-07 MED ORDER — IOHEXOL 350 MG/ML SOLN
75.0000 mL | Freq: Once | INTRAVENOUS | Status: AC | PRN
  Administered 2020-12-07: 75 mL via INTRAVENOUS

## 2020-12-07 MED ORDER — INSULIN GLARGINE 100 UNIT/ML ~~LOC~~ SOLN
15.0000 [IU] | Freq: Every day | SUBCUTANEOUS | Status: DC
  Administered 2020-12-08: 15 [IU] via SUBCUTANEOUS
  Filled 2020-12-07: qty 0.15

## 2020-12-07 MED ORDER — CALCIUM CITRATE-VITAMIN D 200-250 MG-UNIT PO TABS: 1.0000 | ORAL_TABLET | Freq: Every day | ORAL | Status: DC

## 2020-12-07 MED ORDER — INSULIN ASPART 100 UNIT/ML ~~LOC~~ SOLN
0.0000 [IU] | Freq: Three times a day (TID) | SUBCUTANEOUS | Status: DC
  Administered 2020-12-08: 3 [IU] via SUBCUTANEOUS
  Administered 2020-12-08: 1 [IU] via SUBCUTANEOUS

## 2020-12-07 MED ORDER — VITAMIN B-12 1000 MCG PO TABS
1000.0000 ug | ORAL_TABLET | Freq: Every day | ORAL | Status: DC
  Administered 2020-12-08: 1000 ug via ORAL
  Filled 2020-12-07: qty 1

## 2020-12-07 MED ORDER — CLOPIDOGREL BISULFATE 75 MG PO TABS
75.0000 mg | ORAL_TABLET | Freq: Every day | ORAL | Status: DC
  Filled 2020-12-07: qty 1

## 2020-12-07 MED ORDER — LABETALOL HCL 100 MG PO TABS
200.0000 mg | ORAL_TABLET | Freq: Two times a day (BID) | ORAL | Status: DC
  Administered 2020-12-07 – 2020-12-08 (×2): 200 mg via ORAL
  Filled 2020-12-07 (×3): qty 2

## 2020-12-07 NOTE — Evaluation (Signed)
Occupational Therapy Evaluation Patient Details Name: [name] [name] MRN: [id number] DOB: May 17, 1936 Today's Date: 12/07/2020    History of Present Illness [name] [name] is a 85 y.o. female with medical history significant of anemia, chronic diarrhea, hypertension, GERD, hyperlipidemia, anxiety, depression, diabetes, diverticulosis who presents after episode of difficulty articulating her words and decrease in station this morning.   Clinical Impression   [name] [name] is a pleasant 85 year old woman who presents without focal neurological deficits and exhibits baseline ROM, strength and coordination. Patient demonstrated ability to perform bed transfers with supervision and and ambulate in room a short distance with cane. Patient exhibits the physical abilities to perform all aspects of ADLs. Patient had no reports of decreased vision compared to normal and able to read writing on the wall, locate the clock and tell therapist the correct time. Patient did exhibit difficulty with her memory and exhibit some mild difficulty with articulating her thoughts but completely comprehensible. Patient has no OT needs but therapist recommends cognitive evaluation by SLP.    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 Restrictions Weight Bearing Restrictions: No      Mobility Bed Mobility Overal bed mobility: Needs Assistance Bed Mobility: Supine to Sit;Sit to Supine       Sit to supine: HOB elevated;Supervision        Transfers Overall transfer level: Needs assistance Equipment used: Straight cane Transfers: Sit to/from [facility] Sit to Stand: Min guard Stand pivot transfers: Min guard            Balance  Overall balance assessment: Mild deficits observed, not formally tested                                         ADL either performed or assessed with clinical judgement   ADL Overall ADL's : At baseline       Grooming Details (indicate cue type and reason): needs assistance to manage hair due to impaired shoulder ROm                                     Vision Patient Visual Report: No change from baseline       Perception     Praxis      Pertinent Vitals/Pain Pain Assessment: No/denies pain     Hand Dominance Right   Extremity/Trunk Assessment Upper Extremity Assessment Upper Extremity Assessment: Overall WFL for tasks assessed;RUE deficits/detail (decreased shoulder ROM bilaterally from prior proximal humeral fractures.)   Lower Extremity Assessment Lower Extremity Assessment: Defer to PT evaluation   Cervical / Trunk Assessment Cervical / Trunk Assessment: Kyphotic   Communication Communication Communication: No difficulties   Cognition Arousal/Alertness: Awake/alert Behavior During Therapy: WFL for tasks assessed/performed Overall Cognitive Status: Impaired/Different from baseline Area of Impairment: Memory                                   General Comments       Exercises     Shoulder  Instructions      Home Living Family/patient expects to be discharged to:: Private residence Living Arrangements: Spouse/significant other Available Help at Discharge: Family Type of Home: House Home Access: Level entry     [place]: One level     Bathroom Shower/Tub: Occupational psychologist: Handicapped height Bathroom Accessibility: Yes   Home Equipment: Environmental consultant - 2 wheels;Cane - single point;Bedside commode;Shower seat - built in;Grab bars - tub/shower;Grab bars - toilet          Prior Functioning/Environment Level of Independence: Independent with assistive device(s)        Comments: has been  bathing at the sink otherwise independent.        OT Problem List:        OT Treatment/Interventions:      OT Goals(Current goals can be found in the care plan section) Acute Rehab OT Goals OT Goal Formulation: Patient unable to participate in goal setting  OT Frequency:     Barriers to D/C:            Co-evaluation              AM-PAC OT "6 Clicks" Daily Activity     Outcome Measure Help from another person eating meals?: None Help from another person taking care of personal grooming?: None Help from another person toileting, which includes using toliet, bedpan, or urinal?: None Help from another person bathing (including washing, rinsing, drying)?: None Help from another person to put on and taking off regular upper body clothing?: None Help from another person to put on and taking off regular lower body clothing?: None 6 Click Score: 24   End of Session Nurse Communication:  (okay to see)  Activity Tolerance: Patient tolerated treatment well Patient left: in bed;with call bell/phone within reach;with nursing/sitter in room  OT Visit Diagnosis: Other symptoms and signs involving the nervous system (R29.898)                Time: 1478-2956 OT Time Calculation (min): 20 min Charges:  OT General Charges $OT Visit: 1 Visit OT Evaluation $OT Eval Low Complexity: 1 Low  Rease Wence, OTR/L [place]  Office [phone] Pager: [phone]   Lenward Chancellor 12/07/2020, 12:00 PM

## 2020-12-07 NOTE — ED Notes (Signed)
No verbal report-chart reviewed

## 2020-12-07 NOTE — Plan of Care (Signed)
  Problem: Education: Goal: Knowledge of disease or condition will improve 12/07/2020 1718 by Rance Muir, RN Outcome: Progressing 12/07/2020 1717 by Rance Muir, RN Outcome: Progressing Goal: Knowledge of secondary prevention will improve 12/07/2020 1718 by Rance Muir, RN Outcome: Progressing 12/07/2020 1717 by Rance Muir, RN Outcome: Progressing Goal: Knowledge of patient specific risk factors addressed and post discharge goals established will improve 12/07/2020 1718 by Rance Muir, RN Outcome: Progressing 12/07/2020 1717 by Rance Muir, RN Outcome: Progressing

## 2020-12-07 NOTE — Progress Notes (Addendum)
PROGRESS NOTE   [name] [name]  ZOX:[id number]    DOB: 1936/07/30    DOA: 12/06/2020  PCP: [facility]   I have briefly reviewed patients previous medical records in [facility].  Chief Complaint  Patient presents with  . Aphasia    Brief Narrative:  85 year old married female, lives with spouse, ambulates with the help of a cane, medical history significant for and not limited to type II DM with peripheral neuropathy, HTN, HLD, GERD, anxiety and depression, UTI, presented to the ED with complaints of word finding difficulty, difficulty articulating her words that was noted on morning of ED visit.  This reportedly lasted for about 10 to 20 minutes but even later she was talking abnormally and seemed confused.  She also reported decreased sensation in both her hands.  She was admitted for suspected TIA and acute cystitis.  Neurology was consulted.  Assessment & Plan:  Principal Problem:   TIA (transient ischemic attack) Active Problems:   Essential hypertension   Hyperlipidemia   GERD (gastroesophageal reflux disease)   UTI (urinary tract infection)   Anemia   TIA  CT head: Chronic atrophic and ischemic changes without acute abnormality.  MRI brain: No acute intracranial abnormality.  Generalized volume loss and findings of chronic small vessel disease.  CTA head and neck: No emergent large vessel occlusion.  Moderate stenosis of the left paraclinoid ICA.  Multifocal stenosis of the nondominant vertebral artery including severe stenosis proximally and severe stenosis versus occlusion at the C4 level.  Carotid Dopplers: Velocities in left and right ICA consistent with 1-39% stenosis.  Bilateral vertebral arteries demonstrate antegrade flow.  2D echo: LVEF 60-65%.  No intracardiac source of embolism detected.  Bilateral atrium appeared normal in size.  LDL 39  A1c 7.4  Noncompliant with aspirin 81 mg daily PTA.  OT recommends no follow-up.  PT recommends home  health PT.  SLP input pending.  Allow permissive hypertension due to TIA.  Neurology consultation appreciated.  I discussed in detail with rounding stroke MD Dr. Rory Percy, recommended keeping the patient at [facility] long rather than transferring to [facility], he just saw the patient this afternoon and indicates that he is not convinced that the patient had a TIA but it still remains on the differentials.  [name] for presentation includes TIA versus hypertensive urgency versus acute encephalopathy from acute cystitis.  He recommends aspirin 81 mg daily alone and not dual antiplatelets (this morning he had initially recommended DAPT), blood pressure control to <180 now and then less than 160 in the next couple of days.  Outpatient follow-up with neurology.  Improved but not yet back to baseline  Acute cystitis  Patient had symptoms and urine microscopy suggestive of UTI.  Continue empirically started oral Bactrim.  Follow urine culture results.  This could have also contributed to patient's confusion on admission.  Hypertensive urgency/essential hypertension  Blood pressures peaked to as high as 242/98 in ED.  Blood pressure management as above per neurology recommendations.  Resume antihypertensives gradually and monitor closely: Restarted home dose of labetalol 200 mg twice daily and HCTZ 25 mg daily now and plan to resume home dose of Vasotec 20 mg daily from tomorrow.  As needed IV labetalol.  Uncontrolled type II DM with peripheral neuropathy  A1c 7.4, goal <7.  Follows with Dr. Dwyane Dee, Endocrinology  Monitor CBGs and adjust insulins as needed.  Close outpatient follow-up for better control of DM.  Lantus at reduced dose and SSI.  Hyperlipidemia  LDL 31, goal <70  Continue statins and colestipol.  Anemia  Stable  GERD  Continue Pepcid  Anxiety and depression  Continue home meds.  There is no height or weight on file to calculate BMI.    DVT prophylaxis:  enoxaparin (LOVENOX) injection 40 mg Start: 12/06/20 2200     Code Status: Full Code Family Communication: Discussed in detail with patient's son at bedside this morning while in the ED. Disposition:  Status is: Observation  The patient remains OBS appropriate and will d/c before 2 midnights.  Dispo: The patient is from: Home              Anticipated d/c is to: Home possibly 12/08/2020.              Patient currently is not medically stable to d/c.   Difficult to place patient No        Consultants:   Neurology  Procedures:   None  Antimicrobials:    Anti-infectives (From admission, onward)   Start     Dose/Rate Route Frequency Ordered Stop   12/06/20 2215  sulfamethoxazole-trimethoprim (BACTRIM DS) 800-160 MG per tablet 1 tablet        1 tablet Oral Every 12 hours 12/06/20 2204          Subjective:  Patient seen this morning while she was still in the emergency department.  Son was at bedside.  Patient reports that she feels better, tired of being in bed, wants to get out of bed and even go home.  Still appeared somewhat confused with word finding difficulties and memory deficit.  Son states that she is almost 80% better than on arrival.  Objective:   Vitals:   12/07/20 0630 12/07/20 0700 12/07/20 1100 12/07/20 1314  BP: (!) 196/69 (!) 199/81 (!) 177/80 (!) 189/73  Pulse: 70 72 79 73  Resp: 18 20 20 15   Temp:   98.4 F (36.9 C) (!) 97.4 F (36.3 C)  TempSrc:    Oral  SpO2: 94% 96% 96% 95%    General exam: Elderly female, small built and moderately nourished lying comfortably propped up in bed without distress.  Oral mucosa moist. Respiratory system: Clear to auscultation. Respiratory effort normal. Cardiovascular system: S1 & S2 heard, RRR. No JVD, murmurs, rubs, gallops or clicks. No pedal edema. Gastrointestinal system: Abdomen is nondistended, soft and nontender. No organomegaly or masses felt. Normal bowel sounds heard. Central nervous system: Alert and  oriented x2.  Some word finding difficulties.  No dysarthria.  No facial asymmetry.  Tongue midline with normal movements. No focal neurological deficits. Extremities: Symmetric 5 x 5 power.  No pronator drift. Skin: No rashes, lesions or ulcers Psychiatry: Judgement and insight appear impaired. Mood & affect appropriate.     Data Reviewed:   I have personally reviewed following labs and imaging studies   CBC: Recent Labs  Lab 12/06/20 1802 12/06/20 1823  WBC 8.6  --   NEUTROABS 6.1  --   HGB 11.5* 11.9*  HCT 35.3* 35.0*  MCV 88.7  --   PLT 206  --     Basic Metabolic Panel: Recent Labs  Lab 12/05/20 1100 12/06/20 1802 12/06/20 1823  NA 138 140 141  K 4.4 4.2 4.2  CL 106 106 105  CO2 26 23  --   GLUCOSE 97 103* 101*  BUN 30* 27* 28*  CREATININE 1.13 0.87 0.90  CALCIUM 9.6 9.8  --     Liver Function Tests:  Recent Labs  Lab 12/05/20 1100 12/06/20 1802  AST 19 20  ALT 18 21  ALKPHOS 77 91  BILITOT 0.5 0.6  PROT 6.9 7.8  ALBUMIN 4.0 4.7    CBG: No results for input(s): GLUCAP in the last 168 hours.  Microbiology Studies:   Recent Results (from the past 240 hour(s))  Resp Panel by RT-PCR (Flu A&B, Covid) Nasopharyngeal Swab     Status: None   Collection Time: 12/06/20  7:00 PM   Specimen: Nasopharyngeal Swab; Nasopharyngeal(NP) swabs in vial transport medium  Result Value Ref Range Status   SARS Coronavirus 2 by RT PCR NEGATIVE NEGATIVE Final    Comment: (NOTE) SARS-CoV-2 target nucleic acids are NOT DETECTED.  The SARS-CoV-2 RNA is generally detectable in upper respiratory specimens during the acute phase of infection. The lowest concentration of SARS-CoV-2 viral copies this assay can detect is 138 copies/mL. A negative result does not preclude SARS-Cov-2 infection and should not be used as the sole basis for treatment or other patient management decisions. A negative result may occur with  improper specimen collection/handling, submission of  specimen other than nasopharyngeal swab, presence of viral mutation(s) within the areas targeted by this assay, and inadequate number of viral copies(<138 copies/mL). A negative result must be combined with clinical observations, patient history, and epidemiological information. The expected result is Negative.  Fact Sheet for Patients:  EntrepreneurPulse.com.au  Fact Sheet for Healthcare Providers:  IncredibleEmployment.be  This test is no t yet approved or cleared by the [place] FDA and  has been authorized for detection and/or diagnosis of SARS-CoV-2 by FDA under an Emergency Use Authorization (EUA). This EUA will remain  in effect (meaning this test can be used) for the duration of the COVID-19 declaration under Section 564(b)(1) of the Act, 21 U.S.C.section 360bbb-3(b)(1), unless the authorization is terminated  or revoked sooner.       Influenza A by PCR NEGATIVE NEGATIVE Final   Influenza B by PCR NEGATIVE NEGATIVE Final    Comment: (NOTE) The Xpert Xpress SARS-CoV-2/FLU/RSV plus assay is intended as an aid in the diagnosis of influenza from Nasopharyngeal swab specimens and should not be used as a sole basis for treatment. Nasal washings and aspirates are unacceptable for Xpert Xpress SARS-CoV-2/FLU/RSV testing.  Fact Sheet for Patients: EntrepreneurPulse.com.au  Fact Sheet for Healthcare Providers: IncredibleEmployment.be  This test is not yet approved or cleared by the [place] FDA and has been authorized for detection and/or diagnosis of SARS-CoV-2 by FDA under an Emergency Use Authorization (EUA). This EUA will remain in effect (meaning this test can be used) for the duration of the COVID-19 declaration under Section 564(b)(1) of the Act, 21 U.S.C. section 360bbb-3(b)(1), unless the authorization is terminated or revoked.  Performed at [facility], [place]  [address] [place], [place] [address]      Radiology Studies:  CT ANGIO HEAD W OR WO CONTRAST  Result Date: 12/07/2020 CLINICAL DATA:  Neuro deficit, acute stroke suspected. Difficulty with speech. EXAM: CT ANGIOGRAPHY HEAD AND NECK TECHNIQUE: Multidetector CT imaging of the head and neck was performed using the standard protocol during bolus administration of intravenous contrast. Multiplanar CT image reconstructions and MIPs were obtained to evaluate the vascular anatomy. Carotid stenosis measurements (when applicable) are obtained utilizing NASCET criteria, using the distal internal carotid diameter as the denominator. CONTRAST:  79mL OMNIPAQUE IOHEXOL 350 MG/ML SOLN COMPARISON:  CT head and MRI from December 06, 2020. FINDINGS: CTA NECK FINDINGS Aortic arch: Calcific atherosclerosis. [employer]  vessel origins are patent without evidence of hemodynamically significant proximal stenosis. Right carotid system: No evidence of dissection, stenosis (50% or greater) or occlusion. Tortuous ICA. Left carotid system: No evidence of dissection, stenosis (50% or greater) or occlusion. Vertebral arteries: Right dominant. Severe stenosis of the proximal left vertebral artery (series 7, image 72) with additional multifocal moderate stenosis (including at C5-C6 on series 7, image 93) and severe stenosis versus occlusion at C4 level (series 7, image 116). The dominant right vertebral artery is patent with mild narrowing secondary to impression from degenerative osteophytes. Skeleton: Severe degenerative disc disease at C4-C5 C5-C6 and C6-C7 with disc height loss, endplate sclerosis/cystic change, and posterior disc osteophyte complexes. No acute abnormality identified. Other neck: Bilateral thyroid nodules, calcified on the right, measuring up to 1.1 cm. These do not require further imaging follow-up per current guidelines. Otherwise, no masses or suspicious adenopathy. Upper chest: Visualized lung apices are clear. Review of the  MIP images confirms the above findings CTA HEAD FINDINGS Anterior circulation: Predominately calcific atherosclerosis of bilateral cavernous and paraclinoid ICAs. Moderate left paraclinoid ICA stenosis. Bilateral MCAs and ACAs are patent. Bilateral PCOM infundibula without discrete aneurysm. Posterior circulation: Small intradural left vertebral artery which makes a small contribution to the basilar artery. Dominant right intradural vertebral artery is patent. The basilar artery is patent without evidence of significant stenosis. Right fetal type PCA with small right P1 PCA. Bilateral PCAs are patent with multifocal mild stenosis. No aneurysm. Venous sinuses: As permitted by contrast timing, patent. Small left transverse sinus. Review of the MIP images confirms the above findings IMPRESSION: 1. No emergent intracranial large vessel occlusion. 2. Moderate stenosis of the left paraclinoid ICA. 3. Multifocal stenosis of the non-dominant vertebral artery including severe stenosis proximally and severe stenosis versus occlusion at the C4 level. Electronically Signed   By: Margaretha Sheffield MD   On: 12/07/2020 11:02   CT HEAD WO CONTRAST  Result Date: 12/06/2020 CLINICAL DATA:  Difficulty speaking EXAM: CT HEAD WITHOUT CONTRAST TECHNIQUE: Contiguous axial images were obtained from the base of the skull through the vertex without intravenous contrast. COMPARISON:  10/15/2017 FINDINGS: Brain: No evidence of acute infarction, hemorrhage, hydrocephalus, extra-axial collection or mass lesion/mass effect.Mild atrophic changes and chronic white matter ischemic changes are again seen and stable. Vascular: No hyperdense vessel or unexpected calcification. Skull: Normal. Negative for fracture or focal lesion. Sinuses/Orbits: No acute finding. Other: None. IMPRESSION: Chronic atrophic and ischemic changes without acute abnormality. Electronically Signed   By: Inez Catalina M.D.   On: 12/06/2020 19:39   CT ANGIO NECK W OR WO  CONTRAST  Result Date: 12/07/2020 CLINICAL DATA:  Neuro deficit, acute stroke suspected. Difficulty with speech. EXAM: CT ANGIOGRAPHY HEAD AND NECK TECHNIQUE: Multidetector CT imaging of the head and neck was performed using the standard protocol during bolus administration of intravenous contrast. Multiplanar CT image reconstructions and MIPs were obtained to evaluate the vascular anatomy. Carotid stenosis measurements (when applicable) are obtained utilizing NASCET criteria, using the distal internal carotid diameter as the denominator. CONTRAST:  25mL OMNIPAQUE IOHEXOL 350 MG/ML SOLN COMPARISON:  CT head and MRI from December 06, 2020. FINDINGS: CTA NECK FINDINGS Aortic arch: Calcific atherosclerosis. Great vessel origins are patent without evidence of hemodynamically significant proximal stenosis. Right carotid system: No evidence of dissection, stenosis (50% or greater) or occlusion. Tortuous ICA. Left carotid system: No evidence of dissection, stenosis (50% or greater) or occlusion. Vertebral arteries: Right dominant. Severe stenosis of the proximal left vertebral artery (series  7, image 72) with additional multifocal moderate stenosis (including at C5-C6 on series 7, image 93) and severe stenosis versus occlusion at C4 level (series 7, image 116). The dominant right vertebral artery is patent with mild narrowing secondary to impression from degenerative osteophytes. Skeleton: Severe degenerative disc disease at C4-C5 C5-C6 and C6-C7 with disc height loss, endplate sclerosis/cystic change, and posterior disc osteophyte complexes. No acute abnormality identified. Other neck: Bilateral thyroid nodules, calcified on the right, measuring up to 1.1 cm. These do not require further imaging follow-up per current guidelines. Otherwise, no masses or suspicious adenopathy. Upper chest: Visualized lung apices are clear. Review of the MIP images confirms the above findings CTA HEAD FINDINGS Anterior circulation:  Predominately calcific atherosclerosis of bilateral cavernous and paraclinoid ICAs. Moderate left paraclinoid ICA stenosis. Bilateral MCAs and ACAs are patent. Bilateral PCOM infundibula without discrete aneurysm. Posterior circulation: Small intradural left vertebral artery which makes a small contribution to the basilar artery. Dominant right intradural vertebral artery is patent. The basilar artery is patent without evidence of significant stenosis. Right fetal type PCA with small right P1 PCA. Bilateral PCAs are patent with multifocal mild stenosis. No aneurysm. Venous sinuses: As permitted by contrast timing, patent. Small left transverse sinus. Review of the MIP images confirms the above findings IMPRESSION: 1. No emergent intracranial large vessel occlusion. 2. Moderate stenosis of the left paraclinoid ICA. 3. Multifocal stenosis of the non-dominant vertebral artery including severe stenosis proximally and severe stenosis versus occlusion at the C4 level. Electronically Signed   By: Margaretha Sheffield MD   On: 12/07/2020 11:02   MR BRAIN WO CONTRAST  Result Date: 12/06/2020 CLINICAL DATA:  Dizziness EXAM: MRI HEAD WITHOUT CONTRAST TECHNIQUE: Multiplanar, multiecho pulse sequences of the brain and surrounding structures were obtained without intravenous contrast. COMPARISON:  None. FINDINGS: Brain: No acute infarct, mass effect or extra-axial collection. No acute or chronic hemorrhage. There is multifocal hyperintense T2-weighted signal within the white matter. Generalized volume loss without a clear lobar predilection. The midline structures are normal. Vascular: Major flow voids are preserved. Skull and upper cervical spine: Normal calvarium and skull base. Visualized upper cervical spine and soft tissues are normal. Sinuses/Orbits:No paranasal sinus fluid levels or advanced mucosal thickening. No mastoid or middle ear effusion. Normal orbits. IMPRESSION: 1. No acute intracranial abnormality. 2.  Generalized volume loss and findings of chronic small vessel disease. Electronically Signed   By: Ulyses Jarred M.D.   On: 12/06/2020 22:30   ECHOCARDIOGRAM COMPLETE  Result Date: 12/07/2020    ECHOCARDIOGRAM REPORT   Patient Name:   [name] [name] Date of Exam: 12/07/2020 Medical Rec #:  [id number]   Height:       61.0 in Accession #:    [id number]  Weight:       146.0 lb Date of Birth:  Jan 31, 1936    BSA:          1.652 m Patient Age:    49 years    BP:           199/81 mmHg Patient Gender: F           HR:           78 bpm. Exam Location:  Inpatient Procedure: 2D Echo, Cardiac Doppler and Color Doppler Indications:    TIA G45.9  History:        Patient has no prior history of Echocardiogram examinations.  Risk Factors:Hypertension, Diabetes and Dyslipidemia.  Sonographer:    Bernadene Person RDCS Referring Phys: Gu [place]  1. Left ventricular ejection fraction, by estimation, is 60 to 65%. The left ventricle has normal function. The left ventricle has no regional wall motion abnormalities. Left ventricular diastolic parameters are indeterminate.  2. Right ventricular systolic function is normal. The right ventricular size is normal. There is normal pulmonary artery systolic pressure. The estimated right ventricular systolic pressure is 22.2 mmHg.  3. The mitral valve is normal in structure. No evidence of mitral valve regurgitation. No evidence of mitral stenosis.  4. The aortic valve is grossly normal. Aortic valve regurgitation is not visualized. No aortic stenosis is present.  5. The inferior vena cava is normal in size with greater than 50% respiratory variability, suggesting right atrial pressure of 3 mmHg. Conclusion(s)/Recommendation(s): No intracardiac source of embolism detected on this transthoracic study. A transesophageal echocardiogram can be considered to exclude cardiac source of embolism if clinically indicated. FINDINGS  Left Ventricle: Left ventricular ejection  fraction, by estimation, is 60 to 65%. The left ventricle has normal function. The left ventricle has no regional wall motion abnormalities. The left ventricular internal cavity size was normal in size. There is  no left ventricular hypertrophy. Left ventricular diastolic parameters are indeterminate. Right Ventricle: The right ventricular size is normal. No increase in right ventricular wall thickness. Right ventricular systolic function is normal. There is normal pulmonary artery systolic pressure. The tricuspid regurgitant velocity is 2.45 m/s, and  with an assumed right atrial pressure of 3 mmHg, the estimated right ventricular systolic pressure is 97.9 mmHg. Left Atrium: Left atrial size was normal in size. Right Atrium: Right atrial size was normal in size. Pericardium: There is no evidence of pericardial effusion. Mitral Valve: The mitral valve is normal in structure. No evidence of mitral valve regurgitation. No evidence of mitral valve stenosis. Tricuspid Valve: The tricuspid valve is normal in structure. Tricuspid valve regurgitation is trivial. No evidence of tricuspid stenosis. Aortic Valve: The aortic valve is grossly normal. Aortic valve regurgitation is not visualized. No aortic stenosis is present. Pulmonic Valve: The pulmonic valve was grossly normal. Pulmonic valve regurgitation is trivial. No evidence of pulmonic stenosis. Aorta: The aortic root is normal in size and structure. Venous: The inferior vena cava is normal in size with greater than 50% respiratory variability, suggesting right atrial pressure of 3 mmHg. IAS/Shunts: No atrial level shunt detected by color flow Doppler.  LEFT VENTRICLE PLAX 2D LVIDd:         4.40 cm  Diastology LVIDs:         2.80 cm  LV e' medial:    6.81 cm/s LV PW:         0.90 cm  LV E/e' medial:  17.3 LV IVS:        0.90 cm  LV e' lateral:   7.38 cm/s LVOT diam:     1.70 cm  LV E/e' lateral: 16.0 LV SV:         67 LV SV Index:   41 LVOT Area:     2.27 cm  RIGHT  VENTRICLE RV S prime:     14.50 cm/s TAPSE (M-mode): 2.0 cm LEFT ATRIUM             Index       RIGHT ATRIUM           Index LA diam:        4.40 cm 2.66  cm/m  RA Area:     12.40 cm LA Vol (A2C):   43.6 ml 26.39 ml/m RA Volume:   23.10 ml  13.98 ml/m LA Vol (A4C):   43.6 ml 26.39 ml/m LA Biplane Vol: 44.3 ml 26.81 ml/m  AORTIC VALVE LVOT Vmax:   129.00 cm/s LVOT Vmean:  89.500 cm/s LVOT VTI:    0.295 m  AORTA Ao Root diam: 2.50 cm Ao Asc diam:  3.10 cm MITRAL VALVE                TRICUSPID VALVE MV Area (PHT): 2.73 cm     TR Peak grad:   24.0 mmHg MV Decel Time: 278 msec     TR Vmax:        245.00 cm/s MV E velocity: 118.00 cm/s MV A velocity: 123.00 cm/s  SHUNTS MV E/A ratio:  0.96         Systemic VTI:  0.30 m                             Systemic Diam: 1.70 cm Cherlynn Kaiser MD Electronically signed by Cherlynn Kaiser MD Signature Date/Time: 12/07/2020/2:44:47 PM    Final    VAS US CAROTID (at [facility] and [facility] only)  Result Date: 12/07/2020 Carotid Arterial Duplex Study Indications:       TIA. Risk Factors:      Hypertension, hyperlipidemia. Limitations        Today's exam was limited due to the patient's respiratory                    variation. Comparison Study:  No prior studies. Performing Technologist: Oliver Hum RVT  Examination Guidelines: A complete evaluation includes B-mode imaging, spectral Doppler, color Doppler, and power Doppler as needed of all accessible portions of each vessel. Bilateral testing is considered an integral part of a complete examination. Limited examinations for reoccurring indications may be performed as noted.  Right Carotid Findings: +----------+--------+--------+--------+-----------------------+--------+           PSV cm/sEDV cm/sStenosisPlaque Description     Comments +----------+--------+--------+--------+-----------------------+--------+ CCA Prox  89      29              smooth and heterogenous          +----------+--------+--------+--------+-----------------------+--------+ CCA Distal82      21              smooth and heterogenous         +----------+--------+--------+--------+-----------------------+--------+ ICA Prox  80      17              smooth and heterogenoustortuous +----------+--------+--------+--------+-----------------------+--------+ ICA Distal93      25                                     tortuous +----------+--------+--------+--------+-----------------------+--------+ ECA       66      7                                               +----------+--------+--------+--------+-----------------------+--------+ +----------+--------+-------+--------+-------------------+           PSV cm/sEDV cmsDescribeArm Pressure (mmHG) +----------+--------+-------+--------+-------------------+ [id number]                                         +----------+--------+-------+--------+-------------------+ +---------+--------+--+--------+--+---------+  VertebralPSV cm/s94EDV cm/s20Antegrade +---------+--------+--+--------+--+---------+  Left Carotid Findings: +----------+--------+--------+--------+-----------------------+--------+           PSV cm/sEDV cm/sStenosisPlaque Description     Comments +----------+--------+--------+--------+-----------------------+--------+ CCA Prox  93      14              smooth and heterogenous         +----------+--------+--------+--------+-----------------------+--------+ CCA Distal74      16              smooth and heterogenous         +----------+--------+--------+--------+-----------------------+--------+ ICA Prox  75      14              smooth and heterogenous         +----------+--------+--------+--------+-----------------------+--------+ ICA Distal56      20                                     tortuous +----------+--------+--------+--------+-----------------------+--------+ ECA       90      10                                               +----------+--------+--------+--------+-----------------------+--------+ +----------+--------+--------+--------+-------------------+           PSV cm/sEDV cm/sDescribeArm Pressure (mmHG) +----------+--------+--------+--------+-------------------+ [id number]                                         +----------+--------+--------+--------+-------------------+ +---------+--------+--+--------+--+---------+ VertebralPSV cm/s55EDV cm/s11Antegrade +---------+--------+--+--------+--+---------+   Summary: Right Carotid: Velocities in the right ICA are consistent with a 1-39% stenosis. Left Carotid: Velocities in the left ICA are consistent with a 1-39% stenosis. Vertebrals: Bilateral vertebral arteries demonstrate antegrade flow. *See table(s) above for measurements and observations.     Preliminary      Scheduled Meds:   . acidophilus   Oral Daily  . aspirin EC  81 mg Oral Daily  . atorvastatin  20 mg Oral Daily  . clopidogrel  75 mg Oral Daily  . colestipol  1 g Oral BID  . diclofenac sodium  2 g Topical QHS  . DULoxetine  60 mg Oral Daily  . enoxaparin (LOVENOX) injection  40 mg Subcutaneous Q24H  . famotidine  40 mg Oral Daily  . magnesium oxide  400 mg Oral Daily  . sertraline  50 mg Oral Daily  . sulfamethoxazole-trimethoprim  1 tablet Oral Q12H    Continuous Infusions:   . sodium chloride 100 mL/hr (12/07/20 1113)     LOS: 0 days     Vernell Leep, MD, [place], [facility]. [facility]    To contact the attending provider between 7A-7P or the covering provider during after hours 7P-7A, please log into the web site [email or web address] and access using universal [place] password for that web site. If you do not have the password, please call the hospital operator.  12/07/2020, 4:19 PM

## 2020-12-07 NOTE — Progress Notes (Signed)
PT Cancellation Note  Patient Details Name: [name] [name] MRN: [id number] DOB: [birth year]   Cancelled Treatment:    Reason Eval/Treat Not Completed: Patient at procedure or test/unavailable, will check back later.   Claretha Cooper 12/07/2020, 11:57 AM  [place] Pager [phone] Office [phone]

## 2020-12-07 NOTE — Progress Notes (Signed)
SLP Cancellation Note  Patient Details Name: [name] [name] MRN: [id number] DOB: 1936-08-31   Cancelled treatment:       Reason Eval/Treat Not Completed: Patient at procedure or test/unavailable. RN reports pt has just been transferred to the unit from ED, and is currently unavailable. Will continue efforts to complete cognitive linguistic evaluation.  Shekia Kuper B. Quentin Ore, [facility], [place] Speech Language Pathologist Office: [phone] Pager: [phone]  Shonna Chock 12/07/2020, 1:04 PM

## 2020-12-07 NOTE — Progress Notes (Signed)
Patient arrives from the ED via stretcher. Patient ambulatory with 1 assist from stretcher to bed.  Husband and daughter with patient at this time

## 2020-12-07 NOTE — Progress Notes (Addendum)
Stroke Neurology Progress Note   S:// Patient was seen at [facility]. Received antibiotics for presumed UTI in the ER-feeling better after that. Also blood pressures have been high overnight-systolic as high as 628. Reports resolution of neurological symptoms.   O:// Current vital signs: BP (!) 189/73   Pulse 73   Temp (!) 97.4 F (36.3 C) (Oral)   Resp 15   SpO2 95%  Vital signs in last 24 hours: Temp:  [97.4 F (36.3 C)-98.4 F (36.9 C)] 97.4 F (36.3 C) (03/11 1314) Pulse Rate:  [69-92] 73 (03/11 1314) Resp:  [15-24] 15 (03/11 1314) BP: (152-242)/(69-140) 189/73 (03/11 1314) SpO2:  [93 %-100 %] 95 % (03/11 1314) General: Awake alert in no distress HEENT: Normocephalic, atraumatic CVs: Regular rate rhythm Respiratory: Breathing well saturating normally on room air Extremities warm well perfused Neurological exam Awake alert oriented to self, place and time No dysarthria No aphasia Cranial nerves: Pupils equal round react light, extraocular movements intact, visual fields are full, facial sensation intact, face appears symmetric, auditory acuity mildly reduced bilaterally, tongue and palate midline.  Shoulder shrug intact. Motor exam: No drift in any of the 4 extremities Sensation intact light touch all over No dysmetria noticed on coordination exam. NIH stroke scale-0  Medications  Current Facility-Administered Medications:  .  acetaminophen (TYLENOL) tablet 650 mg, 650 mg, Oral, Q4H PRN, 650 mg at 12/07/20 0335 **OR** acetaminophen (TYLENOL) 160 MG/5ML solution 650 mg, 650 mg, Per Tube, Q4H PRN **OR** acetaminophen (TYLENOL) suppository 650 mg, 650 mg, Rectal, Q4H PRN, Marcelyn Bruins, MD .  acidophilus (RISAQUAD) capsule, , Oral, Daily, Marcelyn Bruins, MD .  aspirin EC tablet 81 mg, 81 mg, Oral, Daily, Marcelyn Bruins, MD, 81 mg at 12/07/20 0945 .  atorvastatin (LIPITOR) tablet 20 mg, 20 mg, Oral, Daily, Marcelyn Bruins, MD, 20 mg at  12/07/20 0946 .  [START ON 12/08/2020] calcium-vitamin D (OSCAL WITH D) 500-200 MG-UNIT per tablet 1 tablet, 1 tablet, Oral, Q breakfast, Hongalgi, Anand D, MD .  colestipol (COLESTID) tablet 1 g, 1 g, Oral, BID, Marcelyn Bruins, MD .  diclofenac sodium (VOLTAREN) 1 % transdermal gel 2 g, 2 g, Topical, QHS, Marcelyn Bruins, MD, 2 g at 12/06/20 2310 .  DULoxetine (CYMBALTA) DR capsule 60 mg, 60 mg, Oral, Daily, Marcelyn Bruins, MD, 60 mg at 12/07/20 0945 .  enoxaparin (LOVENOX) injection 40 mg, 40 mg, Subcutaneous, Q24H, Marcelyn Bruins, MD, 40 mg at 12/06/20 2303 .  famotidine (PEPCID) tablet 40 mg, 40 mg, Oral, Daily, Marcelyn Bruins, MD, 40 mg at 12/07/20 0946 .  [START ON 12/08/2020] hydrochlorothiazide (HYDRODIURIL) tablet 25 mg, 25 mg, Oral, q morning, Hongalgi, Anand D, MD .  insulin aspart (novoLOG) injection 0-5 Units, 0-5 Units, Subcutaneous, QHS, Hongalgi, Anand D, MD .  Derrill Memo ON 12/08/2020] insulin aspart (novoLOG) injection 0-9 Units, 0-9 Units, Subcutaneous, TID WC, Hongalgi, Lenis Dickinson, MD .  Derrill Memo ON 12/08/2020] insulin glargine (LANTUS) injection 15 Units, 15 Units, Subcutaneous, Daily, Hongalgi, Anand D, MD .  labetalol (NORMODYNE) injection 5 mg, 5 mg, Intravenous, Q2H PRN, Hongalgi, Anand D, MD .  labetalol (NORMODYNE) tablet 200 mg, 200 mg, Oral, BID, Hongalgi, Anand D, MD .  magnesium oxide (MAG-OX) tablet 400 mg, 400 mg, Oral, Daily, Marcelyn Bruins, MD, 400 mg at 12/07/20 0945 .  [START ON 12/08/2020] multivitamin with minerals tablet 1 tablet, 1 tablet, Oral, Daily, Hongalgi, Anand D, MD .  senna-docusate (Senokot-S) tablet 1 tablet,  1 tablet, Oral, QHS PRN, Marcelyn Bruins, MD .  sertraline (ZOLOFT) tablet 50 mg, 50 mg, Oral, Daily, Marcelyn Bruins, MD, 50 mg at 12/07/20 0947 .  sulfamethoxazole-trimethoprim (BACTRIM DS) 800-160 MG per tablet 1 tablet, 1 tablet, Oral, Q12H, Marcelyn Bruins, MD, 1 tablet at 12/07/20 0945 .  [START ON 12/08/2020]  vitamin B-12 (CYANOCOBALAMIN) tablet 1,000 mcg, 1,000 mcg, Oral, Daily, Modena Jansky, MD Labs CBC    Component Value Date/Time   WBC 8.6 12/06/2020 1802   RBC 3.98 12/06/2020 1802   HGB 11.9 (L) 12/06/2020 1823   HCT 35.0 (L) 12/06/2020 1823   PLT 206 12/06/2020 1802   MCV 88.7 12/06/2020 1802   MCH 28.9 12/06/2020 1802   MCHC 32.6 12/06/2020 1802   RDW 13.5 12/06/2020 1802   LYMPHSABS 1.6 12/06/2020 1802   MONOABS 0.8 12/06/2020 1802   EOSABS 0.1 12/06/2020 1802   BASOSABS 0.1 12/06/2020 1802    CMP     Component Value Date/Time   NA 141 12/06/2020 1823   K 4.2 12/06/2020 1823   CL 105 12/06/2020 1823   CO2 23 12/06/2020 1802   GLUCOSE 101 (H) 12/06/2020 1823   BUN 28 (H) 12/06/2020 1823   CREATININE 0.90 12/06/2020 1823   CREATININE 1.07 (H) 12/02/2017 1211   CALCIUM 9.8 12/06/2020 1802   PROT 7.8 12/06/2020 1802   ALBUMIN 4.7 12/06/2020 1802   AST 20 12/06/2020 1802   ALT 21 12/06/2020 1802   ALKPHOS 91 12/06/2020 1802   BILITOT 0.6 12/06/2020 1802   GFRNONAA >60 12/06/2020 1802   GFRAA >60 10/16/2017 0730    lipid Panel     Component Value Date/Time   CHOL 114 12/07/2020 0430   TRIG 129 12/07/2020 0430   HDL 49 12/07/2020 0430   CHOLHDL 2.3 12/07/2020 0430   VLDL 26 12/07/2020 0430   LDLCALC 39 12/07/2020 0430   LDL 39-on atorvastatin at home A1c 7.4 2D echo LVEF 60 to 65% with no regional wall motion abnormalities.  Normal LA size.  Normal valves.   Imaging I have reviewed images in epic and the results pertinent to this consultation are: CT head no acute changes MRI brain-no acute changes.  Generalized volume loss CTA head and neck with no emergent LVO.  Moderate stenosis of the left paraclinoid ICA.  Multifocal stenosis of the nondominant vertebral artery.  Assessment:  85 year old presented with abnormal speech and confusion-initial exam also nonlateralizing. Admitted for work-up as a left hemispheric TIA versus confusion in the setting  of volume depletion. Also had extremely high blood pressures My impression is that she likely had hypertensive emergency v UTI related encephalopathy.   TIA can never be completely ruled out and always has to stay in the differentials.    Imaging negative for stroke.  There is some stenosis of the left paraclinoid ICA on CTA but given that I am not convinced that this truly was a TIA, and hypertensive emergency versus UTI are more plausible-I would steer away from dual antiplatelets at this time due to increased risk of bleeding with dual antiplatelets.  She is supposed to be on aspirin-which she is inconsistently taking.  IMPRESSION Hypertensive emergency versus toxic encephalopathy from UTI versus left hemispheric TIA.  Recommendations:  Blood pressure goal: I would treat this as a hypertensive emergency and normalize blood pressure gradually.  Today's goal should be 626 or less systolic.  Tomorrow's goal gradually should be 160 or less and upon discharge showed normalized to 140/90  or below.  Aspirin daily  Continue atorvastatin-goal LDL less than 70.  Diabetes management per primary team-goal A1c less than 7.  She follows with Dr. Dwyane Dee in endocrinology-defer treatment to the endocrinologist.  Treatment of UTI per primary team as you are  PT OT recommendations  Can follow-up with outpatient neurology in 6 to 8 weeks if desired.  Discussed my plan in detail with attending hospitalist-Dr. [facility]  Stroke neurology will be available as needed-please call with questions.  -- Amie Portland, MD Stroke Neurology Pager: [phone]

## 2020-12-07 NOTE — ED Notes (Signed)
Breakfast tray ordered at this time.

## 2020-12-07 NOTE — ED Notes (Signed)
Echo at bedside

## 2020-12-07 NOTE — ED Notes (Signed)
Pt ambulatory to restroom with cane and minimal assistance.

## 2020-12-07 NOTE — Evaluation (Signed)
Physical Therapy Evaluation Patient Details Name: [name] [name] MRN: [id number] DOB: 11/22/1935 Today's Date: 12/07/2020   History of Present Illness  [name] [name] is a 85 y.o. female with medical history significant of anemia, chronic diarrhea, hypertension, GERD, hyperlipidemia, anxiety, depression, diabetes, diverticulosis who presents after episode of difficulty articulating her words   and also having some trouble with ambulation, feeling unsteady as well as with dizziness. being w/U for tIA.  Clinical Impression  The patient  Required steady min assistance while standing for dynamic activity during toileting managing briefs, tendency for posterior lean and  Ambulating with RW. Patient uses rollator at home.  Spouse and daughter present.  Patient will benefit from [place]. Patient/family reports has had falls at home, impaired sensation in feet. Pt admitted with above diagnosis.   Pt currently with functional limitations due to the deficits listed below (see PT Problem List). Pt will benefit from skilled PT to increase their independence and safety with mobility to allow discharge to the venue listed below.     Follow Up Recommendations Home health PT    Equipment Recommendations  None recommended by PT    Recommendations for Other Services       Precautions / Restrictions Precautions Precautions: Fall Precaution Comments: hx of falls, neuropathy in bilateral lower legs Restrictions Weight Bearing Restrictions: No      Mobility  Bed Mobility   Bed Mobility: Supine to Sit;Sit to Supine     Supine to sit: Supervision Sit to supine: HOB elevated;Supervision   General bed mobility comments: extra time" I am stiff, I broke both my shoulders"    Transfers Overall transfer level: Needs assistance Equipment used: Rolling walker (2 wheeled) Transfers: Sit to/from Stand Sit to Stand: Min assist         General transfer comment: steady assist to rise from bed, cues for  hand placement to push up  Ambulation/Gait Ambulation/Gait assistance: Min assist Gait Distance (Feet): 20 Feet (then 60') Assistive device: Rolling walker (2 wheeled) Gait Pattern/deviations: Step-through pattern;Shuffle Gait velocity: decr   General Gait Details: noted to  run into objects mostly on left but could be RW pulling.  Stairs            Wheelchair Mobility    Modified Rankin (Stroke Patients Only)       Balance Overall balance assessment: History of Falls;Needs assistance Sitting-balance support: No upper extremity supported;Feet supported Sitting balance-Leahy Scale: Good     Standing balance support: During functional activity;No upper extremity supported Standing balance-Leahy Scale: Poor Standing balance comment: noted posterior leaning when pulling up briefs, tendending to lean posterior.                             Pertinent Vitals/Pain Pain Assessment: No/denies pain    Home Living Family/patient expects to be discharged to:: Private residence Living Arrangements: Spouse/significant other;Children Available Help at Discharge: Family;Available 24 hours/day Type of Home: House Home Access: Level entry     Home Layout: One level Home Equipment: Cane - single point;Bedside commode;Shower seat - built in;Grab bars - tub/shower;Grab bars - toilet;Walker - 4 wheels      Prior Function Level of Independence: Independent with assistive device(s)         Comments: has been bathing at the sink otherwise independent.     Hand Dominance   Dominant Hand: Right    Extremity/Trunk Assessment  Lower Extremity Assessment Lower Extremity Assessment: Generalized weakness;LLE deficits/detail RLE Sensation: history of peripheral neuropathy LLE Sensation: history of peripheral neuropathy    Cervical / Trunk Assessment Cervical / Trunk Assessment: Normal  Communication   Communication: No difficulties  Cognition  Arousal/Alertness: Awake/alert Behavior During Therapy: WFL for tasks assessed/performed Overall Cognitive Status: Impaired/Different from baseline                                 General Comments: oriented to place and date, followed directions, at times looked to spouse or daughter for confirmation      General Comments      Exercises     Assessment/Plan    PT Assessment Patient needs continued PT services  PT Problem List Decreased strength;Decreased mobility;Decreased safety awareness;Decreased activity tolerance;Decreased balance;Impaired sensation;Decreased knowledge of precautions       PT Treatment Interventions DME instruction;Therapeutic activities;Gait training;Therapeutic exercise;Patient/family education;Functional mobility training;Balance training    PT Goals (Current goals can be found in the Care Plan section)  Acute Rehab PT Goals Patient Stated Goal: to go home PT Goal Formulation: With patient/family Time For Goal Achievement: 12/21/20 Potential to Achieve Goals: Good    Frequency Min 3X/week   Barriers to discharge        Co-evaluation               AM-PAC PT "6 Clicks" Mobility  Outcome Measure Help needed turning from your back to your side while in a flat bed without using bedrails?: A Little Help needed moving from lying on your back to sitting on the side of a flat bed without using bedrails?: A Little Help needed moving to and from a bed to a chair (including a wheelchair)?: A Little Help needed standing up from a chair using your arms (e.g., wheelchair or bedside chair)?: A Little Help needed to walk in hospital room?: A Little Help needed climbing 3-5 steps with a railing? : A Lot 6 Click Score: 17    End of Session Equipment Utilized During Treatment: Gait belt Activity Tolerance: Patient tolerated treatment well Patient left: in bed;with call bell/phone within reach;with bed alarm set;with family/visitor  present Nurse Communication: Mobility status PT Visit Diagnosis: Unsteadiness on feet (R26.81);Difficulty in walking, not elsewhere classified (R26.2);History of falling (Z91.81);Repeated falls (R29.6)    Time: 8588-5027 PT Time Calculation (min) (ACUTE ONLY): 21 min   Charges:   PT Evaluation $PT Eval Low Complexity: [place] PT Acute Rehabilitation Services Pager [phone] Office [phone]   Claretha Cooper 12/07/2020, 4:39 PM

## 2020-12-07 NOTE — ED Notes (Signed)
Per MD: Complete stroke swallow screen. If pt passes swallow screen- Pt may have a heart healthy diabetic diet.

## 2020-12-07 NOTE — Telephone Encounter (Signed)
-----   Message from Elayne Snare, MD sent at 12/06/2020  9:07 PM EST ----- Please call to let patient know that the lab results are normal including cholesterol and thyroid, labs have been sent to PCP

## 2020-12-07 NOTE — ED Notes (Signed)
Once Echo and PT evaluation completed pt will be transported to inpatient room

## 2020-12-07 NOTE — Progress Notes (Signed)
  Echocardiogram 2D Echocardiogram has been performed.  Fidel Levy 12/07/2020, 12:19 PM

## 2020-12-07 NOTE — Telephone Encounter (Signed)
Called and left a message for pt advising labs came back normal and have been forwarded to PCP.

## 2020-12-07 NOTE — Progress Notes (Signed)
Carotid artery duplex has been completed. Preliminary results can be found in CV Proc through chart review.   12/07/20 8:54 AM [name] [name] RVT

## 2020-12-07 NOTE — ED Notes (Signed)
Pt transported to CT at this time.

## 2020-12-08 DIAGNOSIS — I160 Hypertensive urgency: Secondary | ICD-10-CM

## 2020-12-08 DIAGNOSIS — E785 Hyperlipidemia, unspecified: Secondary | ICD-10-CM | POA: Diagnosis not present

## 2020-12-08 LAB — GLUCOSE, CAPILLARY
Glucose-Capillary: 122 mg/dL — ABNORMAL HIGH (ref 70–99)
Glucose-Capillary: 212 mg/dL — ABNORMAL HIGH (ref 70–99)

## 2020-12-08 MED ORDER — SOLIQUA 100-33 UNT-MCG/ML ~~LOC~~ SOPN: 18.0000 [IU] | PEN_INJECTOR | Freq: Every morning | SUBCUTANEOUS | Status: DC

## 2020-12-08 MED ORDER — SULFAMETHOXAZOLE-TRIMETHOPRIM 800-160 MG PO TABS: 1.0000 | ORAL_TABLET | Freq: Two times a day (BID) | ORAL | 0 refills | Status: AC

## 2020-12-08 MED ORDER — ENALAPRIL MALEATE 10 MG PO TABS
20.0000 mg | ORAL_TABLET | Freq: Every day | ORAL | Status: DC
  Administered 2020-12-08: 20 mg via ORAL
  Filled 2020-12-08: qty 2

## 2020-12-08 NOTE — Discharge Instructions (Signed)

## 2020-12-08 NOTE — Discharge Summary (Addendum)
Physician Discharge Summary  [name] [name]:[id number] DOB: Jul 12, 1936  PCP: [facility]  Admitted from: Home Discharged to: Home  Admit date: 12/06/2020 Discharge date: 12/08/2020  Recommendations for Outpatient Follow-up:    Follow-up Information    [facility]. Schedule an appointment as soon as possible for a visit in 1 week(s).   Why: To be seen with repeat labs (CBC & BMP). Contact information: [place] [address] [phone]        Garvin Fila, MD. Schedule an appointment as soon as possible for a visit in 2 week(s).   Specialties: Neurology, Radiology Contact information: [address] [place] [address] [place], [facility] Follow up.   Specialty: Home Health Services Why: agency will provide home health physical therapy. Contact information: [address] [place] [address] [phone]                Home Health: PT, ST    Equipment/Devices: None    Discharge Condition: Improved and stable.   Code Status: Full Code Diet recommendation:  Discharge Diet Orders (From admission, onward)    Start     Ordered   12/08/20 0000  Diet - low sodium heart healthy        12/08/20 1102   12/08/20 0000  Diet Carb Modified        12/08/20 1102           Discharge Diagnoses:  Principal Problem:   TIA (transient ischemic attack) Active Problems:   Essential hypertension   Hyperlipidemia   GERD (gastroesophageal reflux disease)   UTI (urinary tract infection)   Anemia   Brief Summary: 85 year old married female, lives with spouse, ambulates with the help of a cane, medical history significant for and not limited to type II DM with peripheral neuropathy, HTN, HLD, GERD, anxiety and depression, recurrent UTI, presented to the ED with complaints of word finding difficulty, difficulty articulating her words that was noted on morning of ED visit.  This  reportedly lasted for about 10 to 20 minutes but even later she was talking abnormally and seemed confused.  She also reported decreased sensation in both her hands.  She was admitted for suspected TIA and acute cystitis.  Neurology was consulted.  Assessment & Plan:   TIA  CT head: Chronic atrophic and ischemic changes without acute abnormality.  MRI brain: No acute intracranial abnormality.  Generalized volume loss and findings of chronic small vessel disease.  CTA head and neck: No emergent large vessel occlusion.  Moderate stenosis of the left paraclinoid ICA.  Multifocal stenosis of the nondominant vertebral artery including severe stenosis proximally and severe stenosis versus occlusion at the C4 level.  Carotid Dopplers: Velocities in left and right ICA consistent with 1-39% stenosis.  Bilateral vertebral arteries demonstrate antegrade flow.  2D echo: LVEF 60-65%.  No intracardiac source of embolism detected.  Bilateral atrium appeared normal in size.  LDL 39  A1c 7.4  Noncompliant with aspirin 81 mg daily PTA.  OT recommends no follow-up.  PT recommends home health PT.   SLP input pending at time of this note.  Neurology consultation appreciated.  I discussed in detail with rounding stroke MD Dr. Rory Percy on 3/11, indicated that he is not convinced that the patient had a TIA but it still remains on the differentials.  [name] for presentation includes TIA versus hypertensive urgency versus acute encephalopathy  from acute cystitis.  He recommends aspirin 81 mg daily alone and not dual antiplatelets, blood pressure control to 140/90 or below in the next couple of days.  Outpatient follow-up with Neurology.  Improved and back to baseline per family.  [name], suspect that she may have an element of underlying mild cognitive impairment/dementia.  Acute cystitis  Patient had symptoms and urine microscopy suggestive of UTI.  Has completed 2 days of oral Bactrim and  will be discharged on Bactrim to complete total 3 days course  It does not appear that the urine culture was sent unfortunately.  This could have also contributed to patient's confusion on admission.  Daughter indicated that patient has recurrent UTIs.  Recommended considering cranberry juice but noncaloric if available due to history of DM, local hygiene, follow-up with PCP to consider suppressive antibiotics however that runs the risk of side effects including resistant bugs in the future.  Hypertensive urgency/essential hypertension  Blood pressures peaked to as high as 242/98 in ED.  Blood pressure management as above per neurology recommendations.  Gradually resumed home antihypertensives as noted.  BP control has improved as below but not optimal.  Discussed with daughter regarding maintaining home BP log and close follow-up with PCP to titrate meds.  Uncontrolled type II DM with peripheral neuropathy  A1c 7.4, goal <7.  Follows with Dr. Dwyane Dee, Endocrinology  Continue prior home regimen.  Hyperlipidemia  LDL 31, goal <70  Continue statins and colestipol.  Anemia  Stable  GERD  Continue Pepcid  Anxiety and depression  Patient MAR shows both Cymbalta and Zoloft.  I discussed with the daughter who thinks that patient probably is not on Zoloft-which she was on temporarily in the past.  She will confirm.  I advised her to follow-up with PCP closely regarding the above medicines and optimize as needed.      Consultants:   Neurology  Procedures:   None   Discharge Instructions  Discharge Instructions    Ambulatory referral to Neurology   Complete by: As directed    An appointment is requested in approximately: 2 weeks   Call MD for:   Complete by: As directed    Recurrent strokelike symptoms or confusion or mental status changes.   Call MD for:  difficulty breathing, headache or visual disturbances   Complete by: As directed    Call MD  for:  extreme fatigue   Complete by: As directed    Call MD for:  persistant dizziness or light-headedness   Complete by: As directed    Call MD for:  persistant nausea and vomiting   Complete by: As directed    Call MD for:  severe uncontrolled pain   Complete by: As directed    Call MD for:  temperature >100.4   Complete by: As directed    Diet - low sodium heart healthy   Complete by: As directed    Diet Carb Modified   Complete by: As directed    Increase activity slowly   Complete by: As directed        Medication List    TAKE these medications   Accu-Chek Guide test strip Generic drug: glucose blood USE AS DIRECTED THREE TIMES DAILY   aspirin EC 81 MG tablet Take 81 mg by mouth daily.   atorvastatin 40 MG tablet Commonly known as: LIPITOR Take 20 mg by mouth daily.   B-12 1000 MCG Tabs Take 1,000 mcg by mouth daily.   Calcium Citrate-Vitamin  D 200-250 MG-UNIT Tabs Take 1 tablet by mouth daily.   Centrum tablet Take 1 tablet by mouth daily.   colestipol 1 g tablet Commonly known as: COLESTID TAKE 1 TABLET BY MOUTH TWICE DAILY   Comfort EZ Pen Needles 31G X 5 MM Misc Generic drug: Insulin Pen Needle   diclofenac sodium 1 % Gel Commonly known as: VOLTAREN Apply 2 g topically at bedtime.   DULoxetine 60 MG capsule Commonly known as: CYMBALTA Take 60 mg by mouth daily.   enalapril 20 MG tablet Commonly known as: VASOTEC Take 20 mg by mouth daily.   famotidine 20 MG tablet Commonly known as: PEPCID Take 40 mg by mouth daily.   hydrochlorothiazide 25 MG tablet Commonly known as: HYDRODIURIL Take 25 mg by mouth every morning.   labetalol 200 MG tablet Commonly known as: NORMODYNE Take 200 mg by mouth 2 (two) times daily.   magnesium oxide 400 MG tablet Commonly known as: MAG-OX Take 400 mg by mouth daily.   RA PROBIOTIC DIGESTIVE CARE PO Take 1 capsule by mouth daily.   sertraline 50 MG tablet Commonly known as: ZOLOFT   [place] 100-33  UNT-MCG/ML Sopn Generic drug: Insulin Glargine-Lixisenatide Inject 18 Units into the skin every morning.   sulfamethoxazole-trimethoprim 800-160 MG tablet Commonly known as: BACTRIM DS Take 1 tablet by mouth 2 (two) times daily for 1 day.      No Known Allergies    Procedures/Studies: CT ANGIO HEAD W OR WO CONTRAST  Result Date: 12/07/2020 CLINICAL DATA:  Neuro deficit, acute stroke suspected. Difficulty with speech. EXAM: CT ANGIOGRAPHY HEAD AND NECK TECHNIQUE: Multidetector CT imaging of the head and neck was performed using the standard protocol during bolus administration of intravenous contrast. Multiplanar CT image reconstructions and MIPs were obtained to evaluate the vascular anatomy. Carotid stenosis measurements (when applicable) are obtained utilizing NASCET criteria, using the distal internal carotid diameter as the denominator. CONTRAST:  74mL OMNIPAQUE IOHEXOL 350 MG/ML SOLN COMPARISON:  CT head and MRI from December 06, 2020. FINDINGS: CTA NECK FINDINGS Aortic arch: Calcific atherosclerosis. Great vessel origins are patent without evidence of hemodynamically significant proximal stenosis. Right carotid system: No evidence of dissection, stenosis (50% or greater) or occlusion. Tortuous ICA. Left carotid system: No evidence of dissection, stenosis (50% or greater) or occlusion. Vertebral arteries: Right dominant. Severe stenosis of the proximal left vertebral artery (series 7, image 72) with additional multifocal moderate stenosis (including at C5-C6 on series 7, image 93) and severe stenosis versus occlusion at C4 level (series 7, image 116). The dominant right vertebral artery is patent with mild narrowing secondary to impression from degenerative osteophytes. Skeleton: Severe degenerative disc disease at C4-C5 C5-C6 and C6-C7 with disc height loss, endplate sclerosis/cystic change, and posterior disc osteophyte complexes. No acute abnormality identified. Other neck: Bilateral thyroid  nodules, calcified on the right, measuring up to 1.1 cm. These do not require further imaging follow-up per current guidelines. Otherwise, no masses or suspicious adenopathy. Upper chest: Visualized lung apices are clear. Review of the MIP images confirms the above findings CTA HEAD FINDINGS Anterior circulation: Predominately calcific atherosclerosis of bilateral cavernous and paraclinoid ICAs. Moderate left paraclinoid ICA stenosis. Bilateral MCAs and ACAs are patent. Bilateral PCOM infundibula without discrete aneurysm. Posterior circulation: Small intradural left vertebral artery which makes a small contribution to the basilar artery. Dominant right intradural vertebral artery is patent. The basilar artery is patent without evidence of significant stenosis. Right fetal type PCA with small right P1 PCA. Bilateral PCAs  are patent with multifocal mild stenosis. No aneurysm. Venous sinuses: As permitted by contrast timing, patent. Small left transverse sinus. Review of the MIP images confirms the above findings IMPRESSION: 1. No emergent intracranial large vessel occlusion. 2. Moderate stenosis of the left paraclinoid ICA. 3. Multifocal stenosis of the non-dominant vertebral artery including severe stenosis proximally and severe stenosis versus occlusion at the C4 level. Electronically Signed   By: Margaretha Sheffield MD   On: 12/07/2020 11:02   CT HEAD WO CONTRAST  Result Date: 12/06/2020 CLINICAL DATA:  Difficulty speaking EXAM: CT HEAD WITHOUT CONTRAST TECHNIQUE: Contiguous axial images were obtained from the base of the skull through the vertex without intravenous contrast. COMPARISON:  10/15/2017 FINDINGS: Brain: No evidence of acute infarction, hemorrhage, hydrocephalus, extra-axial collection or mass lesion/mass effect.Mild atrophic changes and chronic white matter ischemic changes are again seen and stable. Vascular: No hyperdense vessel or unexpected calcification. Skull: Normal. Negative for fracture or  focal lesion. Sinuses/Orbits: No acute finding. Other: None. IMPRESSION: Chronic atrophic and ischemic changes without acute abnormality. Electronically Signed   By: Inez Catalina M.D.   On: 12/06/2020 19:39   CT ANGIO NECK W OR WO CONTRAST  Result Date: 12/07/2020 CLINICAL DATA:  Neuro deficit, acute stroke suspected. Difficulty with speech. EXAM: CT ANGIOGRAPHY HEAD AND NECK TECHNIQUE: Multidetector CT imaging of the head and neck was performed using the standard protocol during bolus administration of intravenous contrast. Multiplanar CT image reconstructions and MIPs were obtained to evaluate the vascular anatomy. Carotid stenosis measurements (when applicable) are obtained utilizing NASCET criteria, using the distal internal carotid diameter as the denominator. CONTRAST:  32mL OMNIPAQUE IOHEXOL 350 MG/ML SOLN COMPARISON:  CT head and MRI from December 06, 2020. FINDINGS: CTA NECK FINDINGS Aortic arch: Calcific atherosclerosis. Great vessel origins are patent without evidence of hemodynamically significant proximal stenosis. Right carotid system: No evidence of dissection, stenosis (50% or greater) or occlusion. Tortuous ICA. Left carotid system: No evidence of dissection, stenosis (50% or greater) or occlusion. Vertebral arteries: Right dominant. Severe stenosis of the proximal left vertebral artery (series 7, image 72) with additional multifocal moderate stenosis (including at C5-C6 on series 7, image 93) and severe stenosis versus occlusion at C4 level (series 7, image 116). The dominant right vertebral artery is patent with mild narrowing secondary to impression from degenerative osteophytes. Skeleton: Severe degenerative disc disease at C4-C5 C5-C6 and C6-C7 with disc height loss, endplate sclerosis/cystic change, and posterior disc osteophyte complexes. No acute abnormality identified. Other neck: Bilateral thyroid nodules, calcified on the right, measuring up to 1.1 cm. These do not require further  imaging follow-up per current guidelines. Otherwise, no masses or suspicious adenopathy. Upper chest: Visualized lung apices are clear. Review of the MIP images confirms the above findings CTA HEAD FINDINGS Anterior circulation: Predominately calcific atherosclerosis of bilateral cavernous and paraclinoid ICAs. Moderate left paraclinoid ICA stenosis. Bilateral MCAs and ACAs are patent. Bilateral PCOM infundibula without discrete aneurysm. Posterior circulation: Small intradural left vertebral artery which makes a small contribution to the basilar artery. Dominant right intradural vertebral artery is patent. The basilar artery is patent without evidence of significant stenosis. Right fetal type PCA with small right P1 PCA. Bilateral PCAs are patent with multifocal mild stenosis. No aneurysm. Venous sinuses: As permitted by contrast timing, patent. Small left transverse sinus. Review of the MIP images confirms the above findings IMPRESSION: 1. No emergent intracranial large vessel occlusion. 2. Moderate stenosis of the left paraclinoid ICA. 3. Multifocal stenosis of the non-dominant vertebral  artery including severe stenosis proximally and severe stenosis versus occlusion at the C4 level. Electronically Signed   By: Margaretha Sheffield MD   On: 12/07/2020 11:02   MR BRAIN WO CONTRAST  Result Date: 12/06/2020 CLINICAL DATA:  Dizziness EXAM: MRI HEAD WITHOUT CONTRAST TECHNIQUE: Multiplanar, multiecho pulse sequences of the brain and surrounding structures were obtained without intravenous contrast. COMPARISON:  None. FINDINGS: Brain: No acute infarct, mass effect or extra-axial collection. No acute or chronic hemorrhage. There is multifocal hyperintense T2-weighted signal within the white matter. Generalized volume loss without a clear lobar predilection. The midline structures are normal. Vascular: Major flow voids are preserved. Skull and upper cervical spine: Normal calvarium and skull base. Visualized upper  cervical spine and soft tissues are normal. Sinuses/Orbits:No paranasal sinus fluid levels or advanced mucosal thickening. No mastoid or middle ear effusion. Normal orbits. IMPRESSION: 1. No acute intracranial abnormality. 2. Generalized volume loss and findings of chronic small vessel disease. Electronically Signed   By: Ulyses Jarred M.D.   On: 12/06/2020 22:30   ECHOCARDIOGRAM COMPLETE  Result Date: 12/07/2020    ECHOCARDIOGRAM REPORT   Patient Name:   [name] [name] Date of Exam: 12/07/2020 Medical Rec #:  [id number]   Height:       61.0 in Accession #:    [id number]  Weight:       146.0 lb Date of Birth:  [birth year]    BSA:          1.652 m Patient Age:    59 years    BP:           199/81 mmHg Patient Gender: F           HR:           78 bpm. Exam Location:  Inpatient Procedure: 2D Echo, Cardiac Doppler and Color Doppler Indications:    TIA G45.9  History:        Patient has no prior history of Echocardiogram examinations.                 Risk Factors:Hypertension, Diabetes and Dyslipidemia.  Sonographer:    Bernadene Person RDCS Referring Phys: [place]  1. Left ventricular ejection fraction, by estimation, is 60 to 65%. The left ventricle has normal function. The left ventricle has no regional wall motion abnormalities. Left ventricular diastolic parameters are indeterminate.  2. Right ventricular systolic function is normal. The right ventricular size is normal. There is normal pulmonary artery systolic pressure. The estimated right ventricular systolic pressure is 50.2 mmHg.  3. The mitral valve is normal in structure. No evidence of mitral valve regurgitation. No evidence of mitral stenosis.  4. The aortic valve is grossly normal. Aortic valve regurgitation is not visualized. No aortic stenosis is present.  5. The inferior vena cava is normal in size with greater than 50% respiratory variability, suggesting right atrial pressure of 3 mmHg. Conclusion(s)/Recommendation(s): No  intracardiac source of embolism detected on this transthoracic study. A transesophageal echocardiogram can be considered to exclude cardiac source of embolism if clinically indicated. FINDINGS  Left Ventricle: Left ventricular ejection fraction, by estimation, is 60 to 65%. The left ventricle has normal function. The left ventricle has no regional wall motion abnormalities. The left ventricular internal cavity size was normal in size. There is  no left ventricular hypertrophy. Left ventricular diastolic parameters are indeterminate. Right Ventricle: The right ventricular size is normal. No increase in right ventricular wall thickness. Right ventricular systolic function is  normal. There is normal pulmonary artery systolic pressure. The tricuspid regurgitant velocity is 2.45 m/s, and  with an assumed right atrial pressure of 3 mmHg, the estimated right ventricular systolic pressure is 16.0 mmHg. Left Atrium: Left atrial size was normal in size. Right Atrium: Right atrial size was normal in size. Pericardium: There is no evidence of pericardial effusion. Mitral Valve: The mitral valve is normal in structure. No evidence of mitral valve regurgitation. No evidence of mitral valve stenosis. Tricuspid Valve: The tricuspid valve is normal in structure. Tricuspid valve regurgitation is trivial. No evidence of tricuspid stenosis. Aortic Valve: The aortic valve is grossly normal. Aortic valve regurgitation is not visualized. No aortic stenosis is present. Pulmonic Valve: The pulmonic valve was grossly normal. Pulmonic valve regurgitation is trivial. No evidence of pulmonic stenosis. Aorta: The aortic root is normal in size and structure. Venous: The inferior vena cava is normal in size with greater than 50% respiratory variability, suggesting right atrial pressure of 3 mmHg. IAS/Shunts: No atrial level shunt detected by color flow Doppler.  LEFT VENTRICLE PLAX 2D LVIDd:         4.40 cm  Diastology LVIDs:         2.80 cm  LV  e' medial:    6.81 cm/s LV PW:         0.90 cm  LV E/e' medial:  17.3 LV IVS:        0.90 cm  LV e' lateral:   7.38 cm/s LVOT diam:     1.70 cm  LV E/e' lateral: 16.0 LV SV:         67 LV SV Index:   41 LVOT Area:     2.27 cm  RIGHT VENTRICLE RV S prime:     14.50 cm/s TAPSE (M-mode): 2.0 cm LEFT ATRIUM             Index       RIGHT ATRIUM           Index LA diam:        4.40 cm 2.66 cm/m  RA Area:     12.40 cm LA Vol (A2C):   43.6 ml 26.39 ml/m RA Volume:   23.10 ml  13.98 ml/m LA Vol (A4C):   43.6 ml 26.39 ml/m LA Biplane Vol: 44.3 ml 26.81 ml/m  AORTIC VALVE LVOT Vmax:   129.00 cm/s LVOT Vmean:  89.500 cm/s LVOT VTI:    0.295 m  AORTA Ao Root diam: 2.50 cm Ao Asc diam:  3.10 cm MITRAL VALVE                TRICUSPID VALVE MV Area (PHT): 2.73 cm     TR Peak grad:   24.0 mmHg MV Decel Time: 278 msec     TR Vmax:        245.00 cm/s MV E velocity: 118.00 cm/s MV A velocity: 123.00 cm/s  SHUNTS MV E/A ratio:  0.96         Systemic VTI:  0.30 m                             Systemic Diam: 1.70 cm Cherlynn Kaiser MD Electronically signed by Cherlynn Kaiser MD Signature Date/Time: 12/07/2020/2:44:47 PM    Final    VAS US CAROTID (at [facility] and [facility] only)  Result Date: 12/08/2020 Carotid Arterial Duplex Study Indications:       TIA. Risk Factors:  Hypertension, hyperlipidemia. Limitations        Today's exam was limited due to the patient's respiratory                    variation. Comparison Study:  No prior studies. Performing Technologist: Oliver Hum RVT  Examination Guidelines: A complete evaluation includes B-mode imaging, spectral Doppler, color Doppler, and power Doppler as needed of all accessible portions of each vessel. Bilateral testing is considered an integral part of a complete examination. Limited examinations for reoccurring indications may be performed as noted.  Right Carotid Findings: +----------+--------+--------+--------+-----------------------+--------+           PSV cm/sEDV  cm/sStenosisPlaque Description     Comments +----------+--------+--------+--------+-----------------------+--------+ CCA Prox  89      29              smooth and heterogenous         +----------+--------+--------+--------+-----------------------+--------+ CCA Distal82      21              smooth and heterogenous         +----------+--------+--------+--------+-----------------------+--------+ ICA Prox  80      17              smooth and heterogenoustortuous +----------+--------+--------+--------+-----------------------+--------+ ICA Distal93      25                                     tortuous +----------+--------+--------+--------+-----------------------+--------+ ECA       66      7                                               +----------+--------+--------+--------+-----------------------+--------+ +----------+--------+-------+--------+-------------------+           PSV cm/sEDV cmsDescribeArm Pressure (mmHG) +----------+--------+-------+--------+-------------------+ [id number]                                         +----------+--------+-------+--------+-------------------+ +---------+--------+--+--------+--+---------+ VertebralPSV cm/s94EDV cm/s20Antegrade +---------+--------+--+--------+--+---------+  Left Carotid Findings: +----------+--------+--------+--------+-----------------------+--------+           PSV cm/sEDV cm/sStenosisPlaque Description     Comments +----------+--------+--------+--------+-----------------------+--------+ CCA Prox  93      14              smooth and heterogenous         +----------+--------+--------+--------+-----------------------+--------+ CCA Distal74      16              smooth and heterogenous         +----------+--------+--------+--------+-----------------------+--------+ ICA Prox  75      14              smooth and heterogenous          +----------+--------+--------+--------+-----------------------+--------+ ICA Distal56      20                                     tortuous +----------+--------+--------+--------+-----------------------+--------+ ECA       90      10                                              +----------+--------+--------+--------+-----------------------+--------+ +----------+--------+--------+--------+-------------------+  PSV cm/sEDV cm/sDescribeArm Pressure (mmHG) +----------+--------+--------+--------+-------------------+ [id number]                                         +----------+--------+--------+--------+-------------------+ +---------+--------+--+--------+--+---------+ VertebralPSV cm/s55EDV cm/s11Antegrade +---------+--------+--+--------+--+---------+   Summary: Right Carotid: Velocities in the right ICA are consistent with a 1-39% stenosis. Left Carotid: Velocities in the left ICA are consistent with a 1-39% stenosis. Vertebrals: Bilateral vertebral arteries demonstrate antegrade flow. *See table(s) above for measurements and observations.  Electronically signed by Antony Contras MD on 12/08/2020 at 12:59:31 PM.    Final       Subjective: Poor historian.  Some confusion.  Denies complaints.  Spouse at bedside.  He indicates that she has some baseline confusion and he feels that her mental status is almost back to baseline.  As per RN, no acute issues noted.  Daughter subsequently also felt like the patient was back to her baseline.  Discharge Exam:  Vitals:   12/08/20 0106 12/08/20 0555 12/08/20 1013 12/08/20 1348  BP: (!) 169/71 (!) 185/84 (!) 150/73 (!) 152/74  Pulse: 75 74  69  Resp: 16 20  16   Temp: 98.3 F (36.8 C) 98.8 F (37.1 C)  97.7 F (36.5 C)  TempSrc: Oral   Oral  SpO2: 95% 95%  96%    General exam: Elderly female, small built and moderately nourished lying comfortably propped up in bed without distress.  Oral mucosa moist. Respiratory  system: Clear to auscultation. Respiratory effort normal. Cardiovascular system: S1 & S2 heard, RRR. No JVD, murmurs, rubs, gallops or clicks. No pedal edema.  Telemetry personally reviewed: Sinus rhythm. Gastrointestinal system: Abdomen is nondistended, soft and nontender. No organomegaly or masses felt. Normal bowel sounds heard. Central nervous system: Alert and oriented x2.  No dysarthria.  No facial asymmetry.  Tongue midline with normal movements. No focal neurological deficits.  Seems more like mild confusion at this time rather than word finding difficulties. Extremities: Symmetric 5 x 5 power.  No pronator drift. Skin: No rashes, lesions or ulcers Psychiatry: Judgement and insight appear impaired. Mood & affect appropriate.     The results of significant diagnostics from this hospitalization (including imaging, microbiology, ancillary and laboratory) are listed below for reference.     Microbiology: Recent Results (from the past 240 hour(s))  Resp Panel by RT-PCR (Flu A&B, Covid) Nasopharyngeal Swab     Status: None   Collection Time: 12/06/20  7:00 PM   Specimen: Nasopharyngeal Swab; Nasopharyngeal(NP) swabs in vial transport medium  Result Value Ref Range Status   SARS Coronavirus 2 by RT PCR NEGATIVE NEGATIVE Final    Comment: (NOTE) SARS-CoV-2 target nucleic acids are NOT DETECTED.  The SARS-CoV-2 RNA is generally detectable in upper respiratory specimens during the acute phase of infection. The lowest concentration of SARS-CoV-2 viral copies this assay can detect is 138 copies/mL. A negative result does not preclude SARS-Cov-2 infection and should not be used as the sole basis for treatment or other patient management decisions. A negative result may occur with  improper specimen collection/handling, submission of specimen other than nasopharyngeal swab, presence of viral mutation(s) within the areas targeted by this assay, and inadequate number of viral copies(<138  copies/mL). A negative result must be combined with clinical observations, patient history, and epidemiological information. The expected result is Negative.  Fact Sheet for Patients:  EntrepreneurPulse.com.au  Fact Sheet for Healthcare Providers:  IncredibleEmployment.be  This  test is no t yet approved or cleared by the [place] and  has been authorized for detection and/or diagnosis of SARS-CoV-2 by FDA under an Emergency Use Authorization (EUA). This EUA will remain  in effect (meaning this test can be used) for the duration of the COVID-19 declaration under Section 564(b)(1) of the Act, 21 U.S.C.section 360bbb-3(b)(1), unless the authorization is terminated  or revoked sooner.       Influenza A by PCR NEGATIVE NEGATIVE Final   Influenza B by PCR NEGATIVE NEGATIVE Final    Comment: (NOTE) The Xpert Xpress SARS-CoV-2/FLU/RSV plus assay is intended as an aid in the diagnosis of influenza from Nasopharyngeal swab specimens and should not be used as a sole basis for treatment. Nasal washings and aspirates are unacceptable for Xpert Xpress SARS-CoV-2/FLU/RSV testing.  Fact Sheet for Patients: EntrepreneurPulse.com.au  Fact Sheet for Healthcare Providers: IncredibleEmployment.be  This test is not yet approved or cleared by the [place] FDA and has been authorized for detection and/or diagnosis of SARS-CoV-2 by FDA under an Emergency Use Authorization (EUA). This EUA will remain in effect (meaning this test can be used) for the duration of the COVID-19 declaration under Section 564(b)(1) of the Act, 21 U.S.C. section 360bbb-3(b)(1), unless the authorization is terminated or revoked.  Performed at [facility], [place] [address], [place], [place] [address]      Labs: CBC: Recent Labs  Lab 12/06/20 1802 12/06/20 1823  WBC 8.6  --   NEUTROABS 6.1  --   HGB 11.5* 11.9*   HCT 35.3* 35.0*  MCV 88.7  --   PLT 206  --     Basic Metabolic Panel: Recent Labs  Lab 12/05/20 1100 12/06/20 1802 12/06/20 1823  NA 138 140 141  K 4.4 4.2 4.2  CL 106 106 105  CO2 26 23  --   GLUCOSE 97 103* 101*  BUN 30* 27* 28*  CREATININE 1.13 0.87 0.90  CALCIUM 9.6 9.8  --     Liver Function Tests: Recent Labs  Lab 12/05/20 1100 12/06/20 1802  AST 19 20  ALT 18 21  ALKPHOS 77 91  BILITOT 0.5 0.6  PROT 6.9 7.8  ALBUMIN 4.0 4.7    CBG: Recent Labs  Lab 12/07/20 1725 12/07/20 2051 12/08/20 0725 12/08/20 1122  GLUCAP 128* 123* 122* 212*    Hgb A1c Recent Labs    12/07/20 0430  HGBA1C 7.4*    Lipid Profile Recent Labs    12/07/20 0430  CHOL 114  HDL 49  LDLCALC 39  TRIG 129  CHOLHDL 2.3   Urinalysis    Component Value Date/Time   COLORURINE STRAW (A) 12/06/2020 1825   APPEARANCEUR HAZY (A) 12/06/2020 1825   LABSPEC 1.009 12/06/2020 1825   PHURINE 5.0 12/06/2020 1825   GLUCOSEU NEGATIVE 12/06/2020 1825   GLUCOSEU NEGATIVE 11/16/2018 1219   HGBUR SMALL (A) 12/06/2020 1825   BILIRUBINUR NEGATIVE 12/06/2020 1825   [place] 12/06/2020 1825   PROTEINUR 100 (A) 12/06/2020 1825   UROBILINOGEN 0.2 11/16/2018 1219   NITRITE NEGATIVE 12/06/2020 1825   LEUKOCYTESUR TRACE (A) 12/06/2020 1825    Discussed in detail with patient's spouse at bedside this morning, updated care and answered all questions.  I subsequently spoke with patient's daughter via phone, again discussed in detail.  Time coordinating discharge: 35 minutes  SIGNED:  Vernell Leep, MD, [place], [facility]. [facility]  To contact the attending provider between 7A-7P or the covering provider during after hours 7P-7A,  please log into the web site [email or web address] and access using universal [place] password for that web site. If you do not have the password, please call the hospital operator.

## 2020-12-08 NOTE — Evaluation (Signed)
Speech Language Pathology Evaluation Patient Details Name: CHRISTIA DOMKE MRN: [id number] DOB: 1936/03/29 Today's Date: 12/08/2020 Time: 4562-5638 SLP Time Calculation (min) (ACUTE ONLY): 35 min  Problem List:  Patient Active Problem List   Diagnosis Date Noted  . TIA (transient ischemic attack) 12/06/2020  . Microalbuminuria 02/23/2019  . Anemia 01/12/2018  . Hyperlipidemia 10/16/2017  . GERD (gastroesophageal reflux disease) 10/16/2017  . UTI (urinary tract infection) 10/16/2017  . Hypokalemia 10/16/2017  . Hypomagnesemia 10/16/2017  . Acute metabolic encephalopathy 93/73/4287  . Chronic diarrhea 08/26/2017  . Essential hypertension 02/20/2016  . Dyspnea 02/19/2016   Past Medical History:  Past Medical History:  Diagnosis Date  . Anemia   . Anxiety   . Arthritis   . Chronic back pain   . Chronic diarrhea   . Diabetes ([place])   . Diverticulosis   . GERD (gastroesophageal reflux disease)   . Hyperlipidemia   . Hypertension   . Internal hemorrhoids   . UTI (urinary tract infection)    Past Surgical History:  Past Surgical History:  Procedure Laterality Date  . ABDOMINAL HYSTERECTOMY    . CARDIAC CATHETERIZATION    . CATARACT EXTRACTION, BILATERAL    . CHOLECYSTECTOMY    . COLONOSCOPY  11/2015   Dr. Britta Mccreedy: hyperplastic polyp from sigmoid colon. biopsies from ascending colon were negative for microscopic colitis.   . COLONOSCOPY N/A 03/31/2018   Dr. Gala Romney: Diverticulosis, random colon biopsies negative, hemorrhoids.  No future screening or surveillance colonoscopy is recommended.  . TONSILLECTOMY    . TRIGGER FINGER RELEASE     HPI:  85yo female admitted 12/06/20 with dysarthria and decreased sensation. PMH: anemia, chronic diarrhea, HTN, GERD, HLD, anxiety/depression, DM, diverticulosis. MRI = no acute abnormality   Assessment / Plan / Recommendation Clinical Impression  Pt demonstrates adequate speech and language. Her primary impairments are in working memory and  storage and verbal processing. Though pts language is intact and she can repeat sentences and phrases, when given a complex phrase with little discernible meaning pt is unable to store words to repeat. Likewise she needs max cueing to repeat four random words and to store them, though after teaching her recall of words are excellent. She cannot do a verbally administered mathematical task though her ability improves significantly when provided in writing. She cannot coherently explain the reason for her admission (new information with memory and verbal reasoning) but can describe her method for making spaghetti step by step (long term verbal procedural memory). Discussed need for f/u with Home health SLP and basic strategies to improve pts ability to communicate (using visual imformation and cueing, providing meaningful context and repetition of new information). Emphasized need for pt to participate in her typical ADL's (cooking/medication) with full supervision from family with assist as needed.    SLP Assessment  SLP Recommendation/Assessment: All further Speech Lanaguage Pathology  needs can be addressed in the next venue of care SLP Visit Diagnosis: Attention and concentration deficit    Follow Up Recommendations  Home health SLP    Frequency and Duration           SLP Evaluation Cognition  Overall Cognitive Status: Impaired/Different from baseline Arousal/Alertness: Awake/alert Orientation Level: Oriented to person;Oriented to place;Oriented to time;Oriented to situation Attention: Focused;Sustained;Selective Focused Attention: Appears intact Sustained Attention: Appears intact Selective Attention: Impaired Selective Attention Impairment: Verbal basic;Functional basic Memory: Impaired Memory Impairment: Storage deficit Awareness: Appears intact Problem Solving: Impaired Problem Solving Impairment: Verbal basic;Functional basic  Comprehension  Auditory Comprehension Overall  Auditory Comprehension: Appears within functional limits for tasks assessed Reading Comprehension Reading Status: Within funtional limits    Expression Verbal Expression Overall Verbal Expression: Appears within functional limits for tasks assessed   Oral / Motor  Oral Motor/Sensory Function Overall Oral Motor/Sensory Function: Within functional limits Motor Speech Overall Motor Speech: Appears within functional limits for tasks assessed   GO                   Herbie Baltimore, MA [place] Pager [phone] Office [phone]  Lynann Beaver 12/08/2020, 2:11 PM

## 2020-12-08 NOTE — TOC Progression Note (Signed)
Transition of Care [facility]) - Progression Note    Patient Details  Name: [name] [name] MRN: [id number] Date of Birth: March 26, 1936  [facility] CM/SW Contact  Joaquin Courts, RN Phone Number: 12/08/2020, 12:40 PM  Clinical Narrative:    Alvis Lemmings for [place].   Expected Discharge Plan: [place] Barriers to Discharge: No Barriers Identified  Expected Discharge Plan and Services Expected Discharge Plan: [place]   Discharge Planning Services: CM Consult Post Acute Care Choice: [place] arrangements for the past 2 months: Single Family Home Expected Discharge Date: 12/08/20                         HH Arranged: PT HH Agency: [place] Date [place]: 12/08/20 Time [place]: 1239 Representative spoke with at [place]: [place] ([place]) Interventions    Readmission Risk Interventions No flowsheet data found.

## 2020-12-08 NOTE — TOC Progression Note (Signed)
Transition of Care [facility]) - Progression Note    Patient Details  Name: [name] [name] MRN: [id number] Date of Birth: 10-15-1935  Transition of Care [facility]) CM/SW Contact  Joaquin Courts, RN Phone Number: 12/08/2020, 2:15 PM  Clinical Narrative:    Alvis Lemmings rep notified patient will also need [facility] SLP services.  Expected Discharge Plan: [place] Barriers to Discharge: No Barriers Identified  Expected Discharge Plan and Services Expected Discharge Plan: [place]   Discharge Planning Services: CM Consult Post Acute Care Choice: [place] arrangements for the past 2 months: Single Family Home Expected Discharge Date: 12/08/20                         HH Arranged: PT HH Agency: [place] Date [place]: 12/08/20 Time [place]: 1239 Representative spoke with at [place]: [place] ([place]) Interventions    Readmission Risk Interventions No flowsheet data found.

## 2020-12-08 NOTE — Progress Notes (Signed)
Pt has left the hospital with her daughter and her spouse.

## 2020-12-08 NOTE — Progress Notes (Signed)
Pt will be discharging home this afternoon.  She is currently receiving a Speech Therapy Evaluation. She will be going home with her spouse and their daughter. Discharge instructions given/explained with pt and family verbalizing understanding.  Pt and family aware to followup with PCP.

## 2020-12-10 ENCOUNTER — Other Ambulatory Visit: Payer: Self-pay | Admitting: Gastroenterology

## 2020-12-11 DIAGNOSIS — Z6826 Body mass index (BMI) 26.0-26.9, adult: Secondary | ICD-10-CM | POA: Diagnosis not present

## 2020-12-11 DIAGNOSIS — G459 Transient cerebral ischemic attack, unspecified: Secondary | ICD-10-CM | POA: Diagnosis not present

## 2020-12-11 DIAGNOSIS — Z6825 Body mass index (BMI) 25.0-25.9, adult: Secondary | ICD-10-CM | POA: Diagnosis not present

## 2020-12-12 DIAGNOSIS — I10 Essential (primary) hypertension: Secondary | ICD-10-CM | POA: Diagnosis not present

## 2020-12-12 DIAGNOSIS — E1165 Type 2 diabetes mellitus with hyperglycemia: Secondary | ICD-10-CM | POA: Diagnosis not present

## 2020-12-12 DIAGNOSIS — K219 Gastro-esophageal reflux disease without esophagitis: Secondary | ICD-10-CM | POA: Diagnosis not present

## 2020-12-12 DIAGNOSIS — F419 Anxiety disorder, unspecified: Secondary | ICD-10-CM | POA: Diagnosis not present

## 2020-12-12 DIAGNOSIS — E785 Hyperlipidemia, unspecified: Secondary | ICD-10-CM | POA: Diagnosis not present

## 2020-12-12 DIAGNOSIS — N3000 Acute cystitis without hematuria: Secondary | ICD-10-CM | POA: Diagnosis not present

## 2020-12-12 DIAGNOSIS — I160 Hypertensive urgency: Secondary | ICD-10-CM | POA: Diagnosis not present

## 2020-12-12 DIAGNOSIS — D649 Anemia, unspecified: Secondary | ICD-10-CM | POA: Diagnosis not present

## 2020-12-12 DIAGNOSIS — E1142 Type 2 diabetes mellitus with diabetic polyneuropathy: Secondary | ICD-10-CM | POA: Diagnosis not present

## 2020-12-13 DIAGNOSIS — I160 Hypertensive urgency: Secondary | ICD-10-CM | POA: Diagnosis not present

## 2020-12-13 DIAGNOSIS — D649 Anemia, unspecified: Secondary | ICD-10-CM | POA: Diagnosis not present

## 2020-12-13 DIAGNOSIS — N3000 Acute cystitis without hematuria: Secondary | ICD-10-CM | POA: Diagnosis not present

## 2020-12-13 DIAGNOSIS — E1142 Type 2 diabetes mellitus with diabetic polyneuropathy: Secondary | ICD-10-CM | POA: Diagnosis not present

## 2020-12-13 DIAGNOSIS — E1165 Type 2 diabetes mellitus with hyperglycemia: Secondary | ICD-10-CM | POA: Diagnosis not present

## 2020-12-13 DIAGNOSIS — K219 Gastro-esophageal reflux disease without esophagitis: Secondary | ICD-10-CM | POA: Diagnosis not present

## 2020-12-13 DIAGNOSIS — E785 Hyperlipidemia, unspecified: Secondary | ICD-10-CM | POA: Diagnosis not present

## 2020-12-13 DIAGNOSIS — F419 Anxiety disorder, unspecified: Secondary | ICD-10-CM | POA: Diagnosis not present

## 2020-12-13 DIAGNOSIS — I10 Essential (primary) hypertension: Secondary | ICD-10-CM | POA: Diagnosis not present

## 2020-12-17 ENCOUNTER — Encounter: Payer: Self-pay | Admitting: Neurology

## 2020-12-17 ENCOUNTER — Ambulatory Visit: Payer: Medicare PPO | Admitting: Neurology

## 2020-12-17 VITALS — BP 177/68 | HR 75 | Ht 62.0 in | Wt 143.4 lb

## 2020-12-17 DIAGNOSIS — E785 Hyperlipidemia, unspecified: Secondary | ICD-10-CM | POA: Diagnosis not present

## 2020-12-17 DIAGNOSIS — R410 Disorientation, unspecified: Secondary | ICD-10-CM

## 2020-12-17 DIAGNOSIS — G309 Alzheimer's disease, unspecified: Secondary | ICD-10-CM | POA: Diagnosis not present

## 2020-12-17 DIAGNOSIS — F0280 Dementia in other diseases classified elsewhere without behavioral disturbance: Secondary | ICD-10-CM

## 2020-12-17 DIAGNOSIS — I10 Essential (primary) hypertension: Secondary | ICD-10-CM | POA: Diagnosis not present

## 2020-12-17 DIAGNOSIS — E1142 Type 2 diabetes mellitus with diabetic polyneuropathy: Secondary | ICD-10-CM | POA: Diagnosis not present

## 2020-12-17 DIAGNOSIS — K219 Gastro-esophageal reflux disease without esophagitis: Secondary | ICD-10-CM | POA: Diagnosis not present

## 2020-12-17 DIAGNOSIS — E1165 Type 2 diabetes mellitus with hyperglycemia: Secondary | ICD-10-CM | POA: Diagnosis not present

## 2020-12-17 DIAGNOSIS — N3000 Acute cystitis without hematuria: Secondary | ICD-10-CM | POA: Diagnosis not present

## 2020-12-17 DIAGNOSIS — I160 Hypertensive urgency: Secondary | ICD-10-CM | POA: Diagnosis not present

## 2020-12-17 DIAGNOSIS — D649 Anemia, unspecified: Secondary | ICD-10-CM | POA: Diagnosis not present

## 2020-12-17 DIAGNOSIS — F419 Anxiety disorder, unspecified: Secondary | ICD-10-CM | POA: Diagnosis not present

## 2020-12-17 MED ORDER — MEMANTINE HCL 28 X 5 MG & 21 X 10 MG PO TABS: ORAL_TABLET | ORAL | 12 refills | Status: DC

## 2020-12-17 MED ORDER — MEMANTINE HCL 10 MG PO TABS: 10.0000 mg | ORAL_TABLET | Freq: Two times a day (BID) | ORAL | 3 refills | Status: DC

## 2020-12-17 NOTE — Progress Notes (Signed)
[facility] [address] [place]. [place] [address] [phone]       OFFICE CONSULT NOTE  Ms. [name] [name] Date of Birth:  November 16, 1935 Medical Record Number:  [id number]   Referring MD: Vernell Leep  Reason for Referral: Confusion HPI: [name] [name] is a pleasant 85 year old Caucasian [name] seen today for initial office consultation visit for confusion and memory loss.  History is obtained from the patient and her husband and daughter were accompanying her as well as review of electronic medical records.  I personally reviewed pertinent available imaging films in PACS.  She has past medical history of diabetes, hypertension, hyperlipidemia, anemia who presented on 12/06/2020 to [facility] long emergency room with sudden onset of confusion described had trouble thinking and using the correct words for articulation.  She also had a blank look at speaking to her husband did not seem to understand them or had trouble finding words.  This lasted about 20 minutes and gradually started improving.  She was found to have urine tract infection for which she is started on antibiotics.  She complained of subsequent headache later as well.  Patient also had significantly elevated blood pressure of 333 systolic on admission.  She gradually improved.  Patient apparently and had a somewhat similar episode when she had a previous bout of urinary tract infection as well.  On inquiry the patient's husband states that she has noticed that she is having mild short-term memory difficulties for the last few years.  She is requiring more help with activities of daily living like dressing herself.  She remains a fall risk because of shoulder fracture as well as the left peripheral neuropathy.  She has recently started home physical outpatient speech therapy following recent hospital discharge.  She does have a strong family history of dementia in her sister as well as her mother.  She denies any hallucinations,  delusions, unsafe behavior or agitation.  She did have MRI scan of the brain done on 12/06/2020 which showed mild generalized atrophy and changes of small vessel disease no acute abnormality.  CT angiogram of the brain and neck, showed moderate left paraclinoid carotid stenosis as well as multifocal stenosis of the nondominant right vertebral artery.  Echocardiogram showed normal ejection fraction of 60 to 65%.  Carotid ultrasound was unremarkable.  LDL cholesterol 39 mg percent and hemoglobin A1c of 7.4.  She did not have an EEG done.  Lab work for reversible causes of memory loss was also not done.  ROS:   14 system review of systems is positive for confusion, memory loss, word finding difficulty, UTI, and incoherent speech all other systems negative  PMH:  Past Medical History:  Diagnosis Date  . Anemia   . Anxiety   . Arthritis   . Chronic back pain   . Chronic diarrhea   . Diabetes ([place])   . Diverticulosis   . GERD (gastroesophageal reflux disease)   . Hyperlipidemia   . Hypertension   . Internal hemorrhoids   . UTI (urinary tract infection)     Social History:  Social History   Socioeconomic History  . Marital status: Married    Spouse name: Ashok Croon  . Number of children: 3  . Years of education: Not on file  . Highest education level: Not on file  Occupational History  . Not on file  Tobacco Use  . Smoking status: Never Smoker  . Smokeless tobacco: Never Used  Vaping Use  . Vaping Use: Never used  Substance and Sexual Activity  . Alcohol use: No    Alcohol/week: 0.0 standard drinks  . Drug use: No  . Sexual activity: Not on file  Other Topics Concern  . Not on file  Social History Narrative   Lives with husband   Right Handed   Drinks 1-2 cups caffeine daily   Social Determinants of Health   Financial Resource Strain: Not on file  Food Insecurity: Not on file  Transportation Needs: Not on file  Physical Activity: Not on file  Stress: Not on file  Social  Connections: Not on file  Intimate Partner Violence: Not on file    Medications:   Current Outpatient Medications on File Prior to Visit  Medication Sig Dispense Refill  . ACCU-CHEK GUIDE test strip USE AS DIRECTED THREE TIMES DAILY 150 strip 3  . aspirin EC 81 MG tablet Take 81 mg by mouth daily.    Marland Kitchen atorvastatin (LIPITOR) 40 MG tablet Take 20 mg by mouth daily.     . Calcium Citrate-Vitamin D 200-250 MG-UNIT TABS Take 1 tablet by mouth daily.    . colestipol (COLESTID) 1 g tablet TAKE 1 TABLET BY MOUTH TWICE DAILY 60 tablet 0  . COMFORT EZ PEN NEEDLES 31G X 5 MM MISC     . Cyanocobalamin (B-12) 1000 MCG TABS Take 1,000 mcg by mouth daily.     . diclofenac sodium (VOLTAREN) 1 % GEL Apply 2 g topically at bedtime.    . DULoxetine (CYMBALTA) 60 MG capsule Take 60 mg by mouth daily.    . enalapril (VASOTEC) 20 MG tablet Take 20 mg by mouth daily.    . famotidine (PEPCID) 20 MG tablet Take 40 mg by mouth daily.    . Insulin Glargine-Lixisenatide (SOLIQUA) 100-33 UNT-MCG/ML SOPN Inject 18 Units into the skin every morning.    . labetalol (NORMODYNE) 200 MG tablet Take 200 mg by mouth 2 (two) times daily.    . Lactobacillus Rhamnosus, GG, (RA PROBIOTIC DIGESTIVE CARE PO) Take 1 capsule by mouth daily.    . magnesium oxide (MAG-OX) 400 MG tablet Take 400 mg by mouth daily.    . Multiple Vitamins-Minerals (CENTRUM) tablet Take 1 tablet by mouth daily.    . sertraline (ZOLOFT) 50 MG tablet 25 mg daily.    . hydrochlorothiazide (HYDRODIURIL) 25 MG tablet Take 25 mg by mouth every morning.      No current facility-administered medications on file prior to visit.    Allergies:  No Known Allergies  Physical Exam General: well developed, well nourished pleasant elderly Caucasian [name], seated, in no evident distress Head: head normocephalic and atraumatic.   Neck: supple with no carotid or supraclavicular bruits Cardiovascular: regular rate and rhythm, no murmurs Musculoskeletal: no  deformity Skin:  no rash/petichiae Vascular:  Normal pulses all extremities  Neurologic Exam Mental Status: Awake and fully alert. Oriented to place and time. Recent and remote memory intact. Attention span, concentration and fund of knowledge appropriate. Mood and affect appropriate.  Mini-Mental status exam score 28/30 diminished recall 2/3.  Clock drawing 3/4.  Difficulty with copying intersecting pentagons.  Able to name only 5 animals which can walk on 4 legs Cranial Nerves: Fundoscopic exam reveals sharp disc margins. Pupils equal, briskly reactive to light. Extraocular movements full without nystagmus. Visual fields full to confrontation. Hearing intact. Facial sensation intact. Face, tongue, palate moves normally and symmetrically.  Motor: Normal bulk and tone. Normal strength in all tested extremity muscles except mild ankle dorsiflexor weakness bilaterally.Marland Kitchen  Sensory.: i diminished touch , pinprick , position and vibratory sensation from ankle down bilaterally.  Positive Romberg's test. Coordination: Rapid alternating movements normal in all extremities. Finger-to-nose and heel-to-shin performed accurately bilaterally. Gait and Station: Arises from chair without difficulty. Stance is normal. Gait demonstrates slight broad-based and mild ataxia and uses a cane.  Unsteady while standing on either foot unsupported.. Able to heel, toe and tandem walk with moderate difficulty.  Reflexes: 1+ and symmetric except ankle jerks are depressed. Toes downgoing.     MMSE - Mini Mental State Exam 12/17/2020  Orientation to time 5  Orientation to Place 5  Registration 3  Attention/ Calculation 5  Recall 2  Language- name 2 objects 2  Language- repeat 0  Language- follow 3 step command 3  Language- read & follow direction 1  Write a sentence 1  Copy design 1  Total score 28     ASSESSMENT: 85 year old [name] with episode of transient confusion in the setting of urinary tract infection likely  decompensation in a patient with mild baseline dementia.  Strong family history of Alzheimer's     PLAN: I had a long discussion with the patient, her husband and daughter regarding her episode of transient confusion and memory loss and she likely has underlying mild Alzheimer's dementia given her strong family history and supporting neuroimaging findings.  I recommend checking lab work for reversible causes of memory loss and EEG.  Trial of Namenda starter pack for 1 month and if tolerated then 10 mg twice daily.  She will return for follow-up in 2 months or call earlier if necessary.  Greater than 50% time during this 45-minute consultation visit was spent on counseling and coordination of care about her episodes of confusion and baseline memory loss and dementia and answering questions Antony Contras, MD  [facility] [address] [place], [place] [address]  Phone [phone] Fax [phone] Note: This document was prepared with digital dictation and possible smart phrase technology. Any transcriptional errors that result from this process are unintentional.

## 2020-12-17 NOTE — Patient Instructions (Addendum)
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  I recommend checking lab work for reversible causes of memory loss and EEG.  Trial of Namenda starter pack for 1 month and if tolerated then 10 mg twice daily.  She will return for follow-up in 2 months or call earlier if necessary.  Memantine Tablets What is this medicine? MEMANTINE (MEM an teen) is used to treat dementia caused by Alzheimer's disease. This medicine may be used for other purposes; ask your health care provider or pharmacist if you have questions. COMMON BRAND NAME(S): Namenda What should I tell my health care provider before I take this medicine? They need to know if you have any of these conditions:  difficulty passing urine  kidney disease  liver disease  seizures  an unusual or allergic reaction to memantine, other medicines, foods, dyes, or preservatives  pregnant or trying to get pregnant  breast-feeding How should I use this medicine? Take this medicine by mouth with a glass of water. Follow the directions on the prescription label. You may take this medicine with or without food. Take your doses at regular intervals. Do not take your medicine more often than directed. Continue to take your medicine even if you feel better. Do not stop taking except on the advice of your doctor or health care professional. Talk to your pediatrician regarding the use of this medicine in children. Special care may be needed. Overdosage: If you think you have taken too much of this medicine contact a poison control center or emergency room at once. NOTE: This medicine is only for you. Do not share this medicine with others. What if I miss a dose? If you miss a dose, take it as soon as you can. If it is almost time for your next dose,  take only that dose. Do not take double or extra doses. If you do not take your medicine for several days, contact your health care provider. Your dose may need to be changed. What may interact with this medicine?  acetazolamide  amantadine  cimetidine  dextromethorphan  dofetilide  hydrochlorothiazide  ketamine  metformin  methazolamide  quinidine  ranitidine  sodium bicarbonate  triamterene This list may not describe all possible interactions. Give your health care provider a list of all the medicines, herbs, non-prescription drugs, or dietary supplements you use. Also tell them if you smoke, drink alcohol, or use illegal drugs. Some items may interact with your medicine. What should I watch for while using this medicine? Visit your doctor or health care professional for regular checks on your progress. Check with your doctor or health care professional if there is no improvement in your symptoms or if they get worse. You may get drowsy or dizzy. Do not drive, use machinery, or do anything that needs mental alertness until you know how this drug affects you. Do not stand or sit up quickly, especially if you are an older patient. This reduces the risk of dizzy or fainting spells. Alcohol can make you more drowsy and dizzy. Avoid alcoholic drinks. What side effects may I notice from receiving this medicine? Side effects that you should report to your doctor or health care professional as soon as possible:  allergic reactions like skin rash, itching or hives, swelling of the face, lips, or tongue  agitation or a  feeling of restlessness  depressed mood  dizziness  hallucinations  redness, blistering, peeling or loosening of the skin, including inside the mouth  seizures  vomiting Side effects that usually do not require medical attention (report to your doctor or health care professional if they continue or are  bothersome):  constipation  diarrhea  headache  nausea  trouble sleeping This list may not describe all possible side effects. Call your doctor for medical advice about side effects. You may report side effects to FDA at 1-800-FDA-1088. Where should I keep my medicine? Keep out of the reach of children. Store at room temperature between 15 degrees and 30 degrees C (59 degrees and 86 degrees F). Throw away any unused medicine after the expiration date. NOTE: This sheet is a summary. It may not cover all possible information. If you have questions about this medicine, talk to your doctor, pharmacist, or health care provider.  2021 Elsevier/Gold Standard (2013-07-04 14:10:42)

## 2020-12-18 DIAGNOSIS — I160 Hypertensive urgency: Secondary | ICD-10-CM | POA: Diagnosis not present

## 2020-12-18 DIAGNOSIS — F419 Anxiety disorder, unspecified: Secondary | ICD-10-CM | POA: Diagnosis not present

## 2020-12-18 DIAGNOSIS — D649 Anemia, unspecified: Secondary | ICD-10-CM | POA: Diagnosis not present

## 2020-12-18 DIAGNOSIS — E785 Hyperlipidemia, unspecified: Secondary | ICD-10-CM | POA: Diagnosis not present

## 2020-12-18 DIAGNOSIS — E1165 Type 2 diabetes mellitus with hyperglycemia: Secondary | ICD-10-CM | POA: Diagnosis not present

## 2020-12-18 DIAGNOSIS — G459 Transient cerebral ischemic attack, unspecified: Secondary | ICD-10-CM | POA: Diagnosis not present

## 2020-12-18 DIAGNOSIS — Z6825 Body mass index (BMI) 25.0-25.9, adult: Secondary | ICD-10-CM | POA: Diagnosis not present

## 2020-12-18 DIAGNOSIS — K219 Gastro-esophageal reflux disease without esophagitis: Secondary | ICD-10-CM | POA: Diagnosis not present

## 2020-12-18 DIAGNOSIS — E1142 Type 2 diabetes mellitus with diabetic polyneuropathy: Secondary | ICD-10-CM | POA: Diagnosis not present

## 2020-12-18 DIAGNOSIS — I10 Essential (primary) hypertension: Secondary | ICD-10-CM | POA: Diagnosis not present

## 2020-12-18 DIAGNOSIS — N3000 Acute cystitis without hematuria: Secondary | ICD-10-CM | POA: Diagnosis not present

## 2020-12-18 LAB — DEMENTIA PANEL
Homocysteine: 15.7 umol/L (ref 0.0–21.3)
RPR Ser Ql: NONREACTIVE
TSH: 6.86 u[IU]/mL — ABNORMAL HIGH (ref 0.450–4.500)
Vitamin B-12: 757 pg/mL (ref 232–1245)

## 2020-12-20 DIAGNOSIS — I160 Hypertensive urgency: Secondary | ICD-10-CM | POA: Diagnosis not present

## 2020-12-20 DIAGNOSIS — D649 Anemia, unspecified: Secondary | ICD-10-CM | POA: Diagnosis not present

## 2020-12-20 DIAGNOSIS — I10 Essential (primary) hypertension: Secondary | ICD-10-CM | POA: Diagnosis not present

## 2020-12-20 DIAGNOSIS — E785 Hyperlipidemia, unspecified: Secondary | ICD-10-CM | POA: Diagnosis not present

## 2020-12-20 DIAGNOSIS — N3000 Acute cystitis without hematuria: Secondary | ICD-10-CM | POA: Diagnosis not present

## 2020-12-20 DIAGNOSIS — E1142 Type 2 diabetes mellitus with diabetic polyneuropathy: Secondary | ICD-10-CM | POA: Diagnosis not present

## 2020-12-20 DIAGNOSIS — K219 Gastro-esophageal reflux disease without esophagitis: Secondary | ICD-10-CM | POA: Diagnosis not present

## 2020-12-20 DIAGNOSIS — F419 Anxiety disorder, unspecified: Secondary | ICD-10-CM | POA: Diagnosis not present

## 2020-12-20 DIAGNOSIS — E1165 Type 2 diabetes mellitus with hyperglycemia: Secondary | ICD-10-CM | POA: Diagnosis not present

## 2020-12-21 NOTE — Progress Notes (Signed)
Kindly inform the patient that lab work for reversible causes of memory loss was mostly okay except for underactive thyroid and I recommend she see primary care physician for correction of her thyroid status

## 2020-12-24 DIAGNOSIS — E1142 Type 2 diabetes mellitus with diabetic polyneuropathy: Secondary | ICD-10-CM | POA: Diagnosis not present

## 2020-12-24 DIAGNOSIS — I160 Hypertensive urgency: Secondary | ICD-10-CM | POA: Diagnosis not present

## 2020-12-24 DIAGNOSIS — I10 Essential (primary) hypertension: Secondary | ICD-10-CM | POA: Diagnosis not present

## 2020-12-24 DIAGNOSIS — F419 Anxiety disorder, unspecified: Secondary | ICD-10-CM | POA: Diagnosis not present

## 2020-12-24 DIAGNOSIS — E785 Hyperlipidemia, unspecified: Secondary | ICD-10-CM | POA: Diagnosis not present

## 2020-12-24 DIAGNOSIS — E1165 Type 2 diabetes mellitus with hyperglycemia: Secondary | ICD-10-CM | POA: Diagnosis not present

## 2020-12-24 DIAGNOSIS — N3000 Acute cystitis without hematuria: Secondary | ICD-10-CM | POA: Diagnosis not present

## 2020-12-24 DIAGNOSIS — K219 Gastro-esophageal reflux disease without esophagitis: Secondary | ICD-10-CM | POA: Diagnosis not present

## 2020-12-24 DIAGNOSIS — D649 Anemia, unspecified: Secondary | ICD-10-CM | POA: Diagnosis not present

## 2020-12-25 ENCOUNTER — Telehealth: Payer: Self-pay | Admitting: *Deleted

## 2020-12-25 NOTE — Telephone Encounter (Signed)
Called and LVM for pt about lab results (ok per DPR) per Dr. [name] note. Advised her to call back if she has any further questions/concerns. I forwarded lab results to PCP. Advised pt to follow up with PCP about abnormal thyroid function.

## 2020-12-25 NOTE — Telephone Encounter (Signed)
-----   Message from Wyvonnia Lora, RN sent at 12/24/2020  5:15 PM EDT -----  ----- Message ----- From: Garvin Fila, MD Sent: 12/21/2020   3:11 PM EDT To: Daneil Dolin, MD, Baldomero Lamy, RN  Kindly inform the patient that lab work for reversible causes of memory loss was mostly okay except for underactive thyroid and I recommend she see primary care physician for correction of her thyroid status

## 2020-12-26 DIAGNOSIS — K219 Gastro-esophageal reflux disease without esophagitis: Secondary | ICD-10-CM | POA: Diagnosis not present

## 2020-12-26 DIAGNOSIS — I10 Essential (primary) hypertension: Secondary | ICD-10-CM | POA: Diagnosis not present

## 2020-12-26 DIAGNOSIS — F419 Anxiety disorder, unspecified: Secondary | ICD-10-CM | POA: Diagnosis not present

## 2020-12-26 DIAGNOSIS — N3000 Acute cystitis without hematuria: Secondary | ICD-10-CM | POA: Diagnosis not present

## 2020-12-26 DIAGNOSIS — E785 Hyperlipidemia, unspecified: Secondary | ICD-10-CM | POA: Diagnosis not present

## 2020-12-26 DIAGNOSIS — E1165 Type 2 diabetes mellitus with hyperglycemia: Secondary | ICD-10-CM | POA: Diagnosis not present

## 2020-12-26 DIAGNOSIS — D649 Anemia, unspecified: Secondary | ICD-10-CM | POA: Diagnosis not present

## 2020-12-26 DIAGNOSIS — I160 Hypertensive urgency: Secondary | ICD-10-CM | POA: Diagnosis not present

## 2020-12-26 DIAGNOSIS — E1142 Type 2 diabetes mellitus with diabetic polyneuropathy: Secondary | ICD-10-CM | POA: Diagnosis not present

## 2020-12-28 DIAGNOSIS — E785 Hyperlipidemia, unspecified: Secondary | ICD-10-CM | POA: Diagnosis not present

## 2020-12-28 DIAGNOSIS — D649 Anemia, unspecified: Secondary | ICD-10-CM | POA: Diagnosis not present

## 2020-12-28 DIAGNOSIS — E1165 Type 2 diabetes mellitus with hyperglycemia: Secondary | ICD-10-CM | POA: Diagnosis not present

## 2020-12-28 DIAGNOSIS — F419 Anxiety disorder, unspecified: Secondary | ICD-10-CM | POA: Diagnosis not present

## 2020-12-28 DIAGNOSIS — E1142 Type 2 diabetes mellitus with diabetic polyneuropathy: Secondary | ICD-10-CM | POA: Diagnosis not present

## 2020-12-28 DIAGNOSIS — K219 Gastro-esophageal reflux disease without esophagitis: Secondary | ICD-10-CM | POA: Diagnosis not present

## 2020-12-28 DIAGNOSIS — I10 Essential (primary) hypertension: Secondary | ICD-10-CM | POA: Diagnosis not present

## 2020-12-28 DIAGNOSIS — I160 Hypertensive urgency: Secondary | ICD-10-CM | POA: Diagnosis not present

## 2020-12-28 DIAGNOSIS — N3000 Acute cystitis without hematuria: Secondary | ICD-10-CM | POA: Diagnosis not present

## 2020-12-31 DIAGNOSIS — D649 Anemia, unspecified: Secondary | ICD-10-CM | POA: Diagnosis not present

## 2020-12-31 DIAGNOSIS — N3000 Acute cystitis without hematuria: Secondary | ICD-10-CM | POA: Diagnosis not present

## 2020-12-31 DIAGNOSIS — G459 Transient cerebral ischemic attack, unspecified: Secondary | ICD-10-CM | POA: Diagnosis not present

## 2020-12-31 DIAGNOSIS — F419 Anxiety disorder, unspecified: Secondary | ICD-10-CM | POA: Diagnosis not present

## 2020-12-31 DIAGNOSIS — Z6826 Body mass index (BMI) 26.0-26.9, adult: Secondary | ICD-10-CM | POA: Diagnosis not present

## 2020-12-31 DIAGNOSIS — E785 Hyperlipidemia, unspecified: Secondary | ICD-10-CM | POA: Diagnosis not present

## 2020-12-31 DIAGNOSIS — K219 Gastro-esophageal reflux disease without esophagitis: Secondary | ICD-10-CM | POA: Diagnosis not present

## 2020-12-31 DIAGNOSIS — I160 Hypertensive urgency: Secondary | ICD-10-CM | POA: Diagnosis not present

## 2020-12-31 DIAGNOSIS — E1165 Type 2 diabetes mellitus with hyperglycemia: Secondary | ICD-10-CM | POA: Diagnosis not present

## 2020-12-31 DIAGNOSIS — E1142 Type 2 diabetes mellitus with diabetic polyneuropathy: Secondary | ICD-10-CM | POA: Diagnosis not present

## 2020-12-31 DIAGNOSIS — I10 Essential (primary) hypertension: Secondary | ICD-10-CM | POA: Diagnosis not present

## 2021-01-02 DIAGNOSIS — I160 Hypertensive urgency: Secondary | ICD-10-CM | POA: Diagnosis not present

## 2021-01-02 DIAGNOSIS — I10 Essential (primary) hypertension: Secondary | ICD-10-CM | POA: Diagnosis not present

## 2021-01-02 DIAGNOSIS — N3000 Acute cystitis without hematuria: Secondary | ICD-10-CM | POA: Diagnosis not present

## 2021-01-02 DIAGNOSIS — E1142 Type 2 diabetes mellitus with diabetic polyneuropathy: Secondary | ICD-10-CM | POA: Diagnosis not present

## 2021-01-02 DIAGNOSIS — E1165 Type 2 diabetes mellitus with hyperglycemia: Secondary | ICD-10-CM | POA: Diagnosis not present

## 2021-01-02 DIAGNOSIS — D649 Anemia, unspecified: Secondary | ICD-10-CM | POA: Diagnosis not present

## 2021-01-02 DIAGNOSIS — F419 Anxiety disorder, unspecified: Secondary | ICD-10-CM | POA: Diagnosis not present

## 2021-01-02 DIAGNOSIS — E785 Hyperlipidemia, unspecified: Secondary | ICD-10-CM | POA: Diagnosis not present

## 2021-01-02 DIAGNOSIS — K219 Gastro-esophageal reflux disease without esophagitis: Secondary | ICD-10-CM | POA: Diagnosis not present

## 2021-01-04 DIAGNOSIS — E1142 Type 2 diabetes mellitus with diabetic polyneuropathy: Secondary | ICD-10-CM | POA: Diagnosis not present

## 2021-01-04 DIAGNOSIS — D649 Anemia, unspecified: Secondary | ICD-10-CM | POA: Diagnosis not present

## 2021-01-04 DIAGNOSIS — E785 Hyperlipidemia, unspecified: Secondary | ICD-10-CM | POA: Diagnosis not present

## 2021-01-04 DIAGNOSIS — E1165 Type 2 diabetes mellitus with hyperglycemia: Secondary | ICD-10-CM | POA: Diagnosis not present

## 2021-01-04 DIAGNOSIS — N3000 Acute cystitis without hematuria: Secondary | ICD-10-CM | POA: Diagnosis not present

## 2021-01-04 DIAGNOSIS — F419 Anxiety disorder, unspecified: Secondary | ICD-10-CM | POA: Diagnosis not present

## 2021-01-04 DIAGNOSIS — I10 Essential (primary) hypertension: Secondary | ICD-10-CM | POA: Diagnosis not present

## 2021-01-04 DIAGNOSIS — K219 Gastro-esophageal reflux disease without esophagitis: Secondary | ICD-10-CM | POA: Diagnosis not present

## 2021-01-04 DIAGNOSIS — I160 Hypertensive urgency: Secondary | ICD-10-CM | POA: Diagnosis not present

## 2021-01-06 ENCOUNTER — Other Ambulatory Visit: Payer: Self-pay | Admitting: Gastroenterology

## 2021-01-06 ENCOUNTER — Other Ambulatory Visit: Payer: Self-pay | Admitting: Endocrinology

## 2021-01-06 ENCOUNTER — Encounter (HOSPITAL_COMMUNITY): Payer: Self-pay | Admitting: Emergency Medicine

## 2021-01-06 ENCOUNTER — Other Ambulatory Visit: Payer: Self-pay

## 2021-01-06 ENCOUNTER — Emergency Department (HOSPITAL_COMMUNITY)
Admission: EM | Admit: 2021-01-06 | Discharge: 2021-01-06 | Disposition: A | Discharge status: Home / Self Care | Payer: Medicare PPO | Attending: Emergency Medicine | Admitting: Emergency Medicine

## 2021-01-06 DIAGNOSIS — Z79899 Other long term (current) drug therapy: Secondary | ICD-10-CM | POA: Insufficient documentation

## 2021-01-06 DIAGNOSIS — E119 Type 2 diabetes mellitus without complications: Secondary | ICD-10-CM | POA: Diagnosis not present

## 2021-01-06 DIAGNOSIS — L0390 Cellulitis, unspecified: Secondary | ICD-10-CM

## 2021-01-06 DIAGNOSIS — Z794 Long term (current) use of insulin: Secondary | ICD-10-CM | POA: Diagnosis not present

## 2021-01-06 DIAGNOSIS — R300 Dysuria: Secondary | ICD-10-CM

## 2021-01-06 DIAGNOSIS — Z7982 Long term (current) use of aspirin: Secondary | ICD-10-CM | POA: Insufficient documentation

## 2021-01-06 DIAGNOSIS — I10 Essential (primary) hypertension: Secondary | ICD-10-CM

## 2021-01-06 DIAGNOSIS — L03114 Cellulitis of left upper limb: Secondary | ICD-10-CM | POA: Insufficient documentation

## 2021-01-06 MED ORDER — LABETALOL HCL 200 MG PO TABS
200.0000 mg | ORAL_TABLET | Freq: Once | ORAL | Status: AC
  Administered 2021-01-06: 200 mg via ORAL
  Filled 2021-01-06: qty 1

## 2021-01-06 MED ORDER — CEPHALEXIN 500 MG PO CAPS
500.0000 mg | ORAL_CAPSULE | Freq: Once | ORAL | Status: AC
  Administered 2021-01-06: 500 mg via ORAL
  Filled 2021-01-06: qty 1

## 2021-01-06 MED ORDER — CEPHALEXIN 500 MG PO CAPS: 500.0000 mg | ORAL_CAPSULE | Freq: Two times a day (BID) | ORAL | 0 refills | Status: AC

## 2021-01-06 NOTE — Discharge Instructions (Signed)
Please follow-up with your doctor this week to take another look at your elbow.  If it is not improving on this antibiotic Keflex, you may need to be switched to a different antibiotic.  Your doctor's office can also follow-up on your urine culture.

## 2021-01-06 NOTE — ED Provider Notes (Signed)
[facility] EMERGENCY DEPARTMENT Provider Note   CSN: [id number] Arrival date & time: 01/06/21  1742     History Chief Complaint  Patient presents with  . Wound Check    [name] [name] is a 85 y.o. female here with left elbow wound and dysuria.  Hx of UTI's, treated during last hospitalization 3 weeks ago for UTI, although no culture sent at the time, with bactrim and symptom improvement.  Now with dysuria again.  Also has a small lesion on left elbow with worsening redness and swelling.  Here with daughter.  HPI     Past Medical History:  Diagnosis Date  . Anemia   . Anxiety   . Arthritis   . Chronic back pain   . Chronic diarrhea   . Diabetes ([place])   . Diverticulosis   . GERD (gastroesophageal reflux disease)   . Hyperlipidemia   . Hypertension   . Internal hemorrhoids   . UTI (urinary tract infection)     Patient Active Problem List   Diagnosis Date Noted  . TIA (transient ischemic attack) 12/06/2020  . Microalbuminuria 02/23/2019  . Anemia 01/12/2018  . Spondylolisthesis of lumbar region 12/08/2017  . Hyperlipidemia 10/16/2017  . GERD (gastroesophageal reflux disease) 10/16/2017  . UTI (urinary tract infection) 10/16/2017  . Hypokalemia 10/16/2017  . Hypomagnesemia 10/16/2017  . Acute metabolic encephalopathy 70/35/0093  . 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 Chronic diarrhea 08/26/2017  . Essential hypertension 02/20/2016  . Dyspnea 02/19/2016    Past Surgical History:  Procedure Laterality Date  . ABDOMINAL HYSTERECTOMY    . CARDIAC CATHETERIZATION    . CATARACT EXTRACTION, BILATERAL    . CHOLECYSTECTOMY    . COLONOSCOPY  11/2015   Dr. Britta Mccreedy: hyperplastic polyp from sigmoid colon. biopsies from ascending colon were negative for microscopic colitis.   . COLONOSCOPY N/A 03/31/2018   Dr. Gala Romney: Diverticulosis, random colon biopsies negative, hemorrhoids.  No future screening or surveillance colonoscopy is recommended.  . TONSILLECTOMY    . TRIGGER  FINGER RELEASE       OB History   No obstetric history on file.     Family History  Problem Relation Age of Onset  . Heart attack Sister   . Transient ischemic attack Sister   . Heart attack Mother   . Diabetes Mother   . Transient ischemic attack Mother   . Heart attack Father   . Diabetes Son   . Diabetes Maternal Aunt   . Rectal cancer Other        anal CA  . Colon cancer Neg Hx   . Celiac disease Neg Hx   . Inflammatory bowel disease Neg Hx   . Stomach cancer Neg Hx     Social History   Tobacco Use  . Smoking status: Never Smoker  . Smokeless tobacco: Never Used  Vaping Use  . Vaping Use: Never used  Substance Use Topics  . Alcohol use: No    Alcohol/week: 0.0 standard drinks  . Drug use: No    Home Medications Prior to Admission medications   Medication Sig Start Date End Date Taking? Authorizing Provider  cephALEXin (KEFLEX) 500 MG capsule Take 1 capsule (500 mg total) by mouth 2 (two) times daily for 5 days. 01/06/21 01/11/21 Yes Nazaire Cordial, Carola Rhine, MD  ACCU-CHEK GUIDE test strip USE AS DIRECTED THREE TIMES DAILY 07/17/20   Elayne Snare, MD  aspirin EC 81 MG tablet Take 81 mg by mouth daily.    [provider]  atorvastatin (LIPITOR) 40 MG tablet Take 20 mg by mouth daily.     [provider]  Calcium Citrate-Vitamin D 200-250 MG-UNIT TABS Take 1 tablet by mouth daily.    [provider]  colestipol (COLESTID) 1 g tablet TAKE 1 TABLET BY MOUTH TWICE DAILY 12/10/20   Thornton Park, MD  COMFORT EZ PEN NEEDLES 31G X 5 MM [place]  08/22/20   [provider]  Cyanocobalamin (B-12) 1000 MCG TABS Take 1,000 mcg by mouth daily.     [provider]  diclofenac sodium (VOLTAREN) 1 % GEL Apply 2 g topically at bedtime.    [provider]  DULoxetine (CYMBALTA) 60 MG capsule Take 60 mg by mouth daily.    [provider]  enalapril (VASOTEC) 20 MG tablet Take 20 mg by mouth daily.    [provider]   famotidine (PEPCID) 20 MG tablet Take 40 mg by mouth daily. 11/20/20   [provider]  Insulin Glargine-Lixisenatide (SOLIQUA) 100-33 UNT-MCG/ML SOPN Inject 18 Units into the skin every morning. 12/08/20   Hongalgi, Lenis Dickinson, MD  labetalol (NORMODYNE) 200 MG tablet Take 200 mg by mouth 2 (two) times daily.    [provider]  Lactobacillus Rhamnosus, GG, (RA PROBIOTIC DIGESTIVE CARE PO) Take 1 capsule by mouth daily.    [provider]  magnesium oxide (MAG-OX) 400 MG tablet Take 400 mg by mouth daily.    [provider]  memantine (NAMENDA TITRATION PAK) tablet pack 5 mg/day for =1 week; 5 mg twice daily for =1 week; 15 mg/day given in 5 mg and 10 mg separated doses for =1 week; then 10 mg twice daily 12/17/20   Garvin Fila, MD  memantine (NAMENDA) 10 MG tablet Take 1 tablet (10 mg total) by mouth 2 (two) times daily. Start after finishing namenda starter pack 12/17/20   Garvin Fila, MD  Multiple Vitamins-Minerals (CENTRUM) tablet Take 1 tablet by mouth daily.    [provider]  sertraline (ZOLOFT) 50 MG tablet 25 mg daily. 08/27/20   [provider]    Allergies    Patient has no known allergies.  Review of Systems   Review of Systems  Genitourinary: Positive for dysuria and frequency.  Musculoskeletal: Positive for arthralgias and myalgias.  Skin: Positive for rash and wound.    Physical Exam Updated Vital Signs BP (!) 239/84 (BP Location: Right Arm)   Pulse 88   Temp 98.8 F (37.1 C) (Oral)   Resp 18   Ht 5\' 2"  (2.751 m)   Wt 64.4 kg   SpO2 100%   BMI 25.97 kg/m   Physical Exam Constitutional:      General: She is not in acute distress. HENT:     Head: Normocephalic and atraumatic.  Eyes:     Conjunctiva/sclera: Conjunctivae normal.     Pupils: Pupils are equal, round, and reactive to light.  Cardiovascular:     Rate and Rhythm: Normal rate and regular rhythm.  Pulmonary:     Effort: Pulmonary effort is  normal. No respiratory distress.  Abdominal:     General: There is no distension.     Tenderness: There is no abdominal tenderness.  Musculoskeletal:     Comments: Small lesion on left elbow overlying olecranon with small minimally tender underlying effusion, redness of the skin, no significant warmth of the joint, excellent ROM with minimal joint pain.  Skin:    General: Skin is warm and dry.  Neurological:  General: No focal deficit present.     Mental Status: She is alert. Mental status is at baseline.     ED Results / Procedures / Treatments   Labs (all labs ordered are listed, but only abnormal results are displayed) Labs Reviewed  URINE CULTURE    EKG None  Radiology No results found.  Procedures Procedures   Medications Ordered in ED Medications  cephALEXin (KEFLEX) capsule 500 mg (500 mg Oral Given 01/06/21 2221)  labetalol (NORMODYNE) tablet 200 mg (200 mg Oral Given 01/06/21 2221)    ED Course  I have reviewed the triage vital signs and the nursing notes.  Pertinent labs & imaging results that were available during my care of the patient were reviewed by me and considered in my medical decision making (see chart for details).  Skin lesion - may be early cellulitis.  She is diabetic.  Will start on keflex for skin cellulitis coverage and also UTI coverage.  Urine cx sent.  Very low suspicion for septic joint per clinical exam No systemic symptoms - doubt urosepsis. She is well appearing.  Will treat empirically for UTI per these symptoms.  Advised PCP f/u this week.  Okay for discharge.  Clinical Course as of 01/07/21 0129  Nancy Fetter Jan 06, 2021  2210 She is not taking her evening labetalol.  Her daughter reports that the patient be agitated from the sound field crying in pain.  I think is reasonable for her to take her medication at home.  We will send off urine culture and discharge. [MT]    Clinical Course User Index [MT] Wyvonnia Dusky, MD    Final  Clinical Impression(s) / ED Diagnoses Final diagnoses:  Dysuria  Hypertension, unspecified type  Cellulitis, unspecified cellulitis site    Rx / DC Orders ED Discharge Orders         Ordered    cephALEXin (KEFLEX) 500 MG capsule  2 times daily        01/06/21 2219           Wyvonnia Dusky, MD 01/07/21 [phone]

## 2021-01-06 NOTE — ED Triage Notes (Signed)
Pt c/o a wound on her left elbow for the past 4 weeks.  Pt is also having UTI symptoms.

## 2021-01-07 DIAGNOSIS — I10 Essential (primary) hypertension: Secondary | ICD-10-CM | POA: Diagnosis not present

## 2021-01-07 DIAGNOSIS — Z6825 Body mass index (BMI) 25.0-25.9, adult: Secondary | ICD-10-CM | POA: Diagnosis not present

## 2021-01-07 DIAGNOSIS — G459 Transient cerebral ischemic attack, unspecified: Secondary | ICD-10-CM | POA: Diagnosis not present

## 2021-01-08 ENCOUNTER — Telehealth: Payer: Self-pay | Admitting: Neurology

## 2021-01-08 DIAGNOSIS — Z6826 Body mass index (BMI) 26.0-26.9, adult: Secondary | ICD-10-CM | POA: Diagnosis not present

## 2021-01-08 DIAGNOSIS — M71029 Abscess of bursa, unspecified elbow: Secondary | ICD-10-CM | POA: Diagnosis not present

## 2021-01-08 DIAGNOSIS — L0390 Cellulitis, unspecified: Secondary | ICD-10-CM | POA: Diagnosis not present

## 2021-01-08 NOTE — Telephone Encounter (Signed)
Pt's daughter is needing to speak to the RN regarding the pt's EEG. There is a confusion on if and when pt had it. Please advise.

## 2021-01-08 NOTE — Telephone Encounter (Signed)
Called patient's daughter and she said she has not been contacted for EEG scheduling.  Messaged Dana and awaiting reply.  Told daughter that we would follow up with her.

## 2021-01-09 DIAGNOSIS — I160 Hypertensive urgency: Secondary | ICD-10-CM | POA: Diagnosis not present

## 2021-01-09 DIAGNOSIS — L0390 Cellulitis, unspecified: Secondary | ICD-10-CM | POA: Diagnosis not present

## 2021-01-09 DIAGNOSIS — E785 Hyperlipidemia, unspecified: Secondary | ICD-10-CM | POA: Diagnosis not present

## 2021-01-09 DIAGNOSIS — N3000 Acute cystitis without hematuria: Secondary | ICD-10-CM | POA: Diagnosis not present

## 2021-01-09 DIAGNOSIS — F419 Anxiety disorder, unspecified: Secondary | ICD-10-CM | POA: Diagnosis not present

## 2021-01-09 DIAGNOSIS — E1142 Type 2 diabetes mellitus with diabetic polyneuropathy: Secondary | ICD-10-CM | POA: Diagnosis not present

## 2021-01-09 DIAGNOSIS — I10 Essential (primary) hypertension: Secondary | ICD-10-CM | POA: Diagnosis not present

## 2021-01-09 DIAGNOSIS — E1165 Type 2 diabetes mellitus with hyperglycemia: Secondary | ICD-10-CM | POA: Diagnosis not present

## 2021-01-09 DIAGNOSIS — K219 Gastro-esophageal reflux disease without esophagitis: Secondary | ICD-10-CM | POA: Diagnosis not present

## 2021-01-09 DIAGNOSIS — D649 Anemia, unspecified: Secondary | ICD-10-CM | POA: Diagnosis not present

## 2021-01-09 DIAGNOSIS — M71029 Abscess of bursa, unspecified elbow: Secondary | ICD-10-CM | POA: Diagnosis not present

## 2021-01-10 DIAGNOSIS — F419 Anxiety disorder, unspecified: Secondary | ICD-10-CM | POA: Diagnosis not present

## 2021-01-10 DIAGNOSIS — I10 Essential (primary) hypertension: Secondary | ICD-10-CM | POA: Diagnosis not present

## 2021-01-10 DIAGNOSIS — M71029 Abscess of bursa, unspecified elbow: Secondary | ICD-10-CM | POA: Diagnosis not present

## 2021-01-10 DIAGNOSIS — Z6826 Body mass index (BMI) 26.0-26.9, adult: Secondary | ICD-10-CM | POA: Diagnosis not present

## 2021-01-10 DIAGNOSIS — I160 Hypertensive urgency: Secondary | ICD-10-CM | POA: Diagnosis not present

## 2021-01-10 DIAGNOSIS — E1142 Type 2 diabetes mellitus with diabetic polyneuropathy: Secondary | ICD-10-CM | POA: Diagnosis not present

## 2021-01-10 DIAGNOSIS — N3000 Acute cystitis without hematuria: Secondary | ICD-10-CM | POA: Diagnosis not present

## 2021-01-10 DIAGNOSIS — K219 Gastro-esophageal reflux disease without esophagitis: Secondary | ICD-10-CM | POA: Diagnosis not present

## 2021-01-10 DIAGNOSIS — L0390 Cellulitis, unspecified: Secondary | ICD-10-CM | POA: Diagnosis not present

## 2021-01-10 DIAGNOSIS — E785 Hyperlipidemia, unspecified: Secondary | ICD-10-CM | POA: Diagnosis not present

## 2021-01-10 DIAGNOSIS — E1165 Type 2 diabetes mellitus with hyperglycemia: Secondary | ICD-10-CM | POA: Diagnosis not present

## 2021-01-10 DIAGNOSIS — D649 Anemia, unspecified: Secondary | ICD-10-CM | POA: Diagnosis not present

## 2021-01-11 DIAGNOSIS — N3000 Acute cystitis without hematuria: Secondary | ICD-10-CM | POA: Diagnosis not present

## 2021-01-11 DIAGNOSIS — D649 Anemia, unspecified: Secondary | ICD-10-CM | POA: Diagnosis not present

## 2021-01-11 DIAGNOSIS — E1142 Type 2 diabetes mellitus with diabetic polyneuropathy: Secondary | ICD-10-CM | POA: Diagnosis not present

## 2021-01-11 DIAGNOSIS — E1165 Type 2 diabetes mellitus with hyperglycemia: Secondary | ICD-10-CM | POA: Diagnosis not present

## 2021-01-11 DIAGNOSIS — I160 Hypertensive urgency: Secondary | ICD-10-CM | POA: Diagnosis not present

## 2021-01-11 DIAGNOSIS — E785 Hyperlipidemia, unspecified: Secondary | ICD-10-CM | POA: Diagnosis not present

## 2021-01-11 DIAGNOSIS — F419 Anxiety disorder, unspecified: Secondary | ICD-10-CM | POA: Diagnosis not present

## 2021-01-11 DIAGNOSIS — K219 Gastro-esophageal reflux disease without esophagitis: Secondary | ICD-10-CM | POA: Diagnosis not present

## 2021-01-11 DIAGNOSIS — I10 Essential (primary) hypertension: Secondary | ICD-10-CM | POA: Diagnosis not present

## 2021-01-12 DIAGNOSIS — E1142 Type 2 diabetes mellitus with diabetic polyneuropathy: Secondary | ICD-10-CM | POA: Diagnosis not present

## 2021-01-12 DIAGNOSIS — I10 Essential (primary) hypertension: Secondary | ICD-10-CM | POA: Diagnosis not present

## 2021-01-12 DIAGNOSIS — E785 Hyperlipidemia, unspecified: Secondary | ICD-10-CM | POA: Diagnosis not present

## 2021-01-12 DIAGNOSIS — I160 Hypertensive urgency: Secondary | ICD-10-CM | POA: Diagnosis not present

## 2021-01-12 DIAGNOSIS — K219 Gastro-esophageal reflux disease without esophagitis: Secondary | ICD-10-CM | POA: Diagnosis not present

## 2021-01-12 DIAGNOSIS — D649 Anemia, unspecified: Secondary | ICD-10-CM | POA: Diagnosis not present

## 2021-01-12 DIAGNOSIS — N3000 Acute cystitis without hematuria: Secondary | ICD-10-CM | POA: Diagnosis not present

## 2021-01-12 DIAGNOSIS — E1165 Type 2 diabetes mellitus with hyperglycemia: Secondary | ICD-10-CM | POA: Diagnosis not present

## 2021-01-12 DIAGNOSIS — F419 Anxiety disorder, unspecified: Secondary | ICD-10-CM | POA: Diagnosis not present

## 2021-01-14 DIAGNOSIS — L0390 Cellulitis, unspecified: Secondary | ICD-10-CM | POA: Diagnosis not present

## 2021-01-14 DIAGNOSIS — M71029 Abscess of bursa, unspecified elbow: Secondary | ICD-10-CM | POA: Diagnosis not present

## 2021-01-14 DIAGNOSIS — Z6825 Body mass index (BMI) 25.0-25.9, adult: Secondary | ICD-10-CM | POA: Diagnosis not present

## 2021-01-14 NOTE — Telephone Encounter (Signed)
Spoke to Pt's daughter EEG Scheduled . Thanks Jillian .

## 2021-01-15 DIAGNOSIS — D649 Anemia, unspecified: Secondary | ICD-10-CM | POA: Diagnosis not present

## 2021-01-15 DIAGNOSIS — E785 Hyperlipidemia, unspecified: Secondary | ICD-10-CM | POA: Diagnosis not present

## 2021-01-15 DIAGNOSIS — E1142 Type 2 diabetes mellitus with diabetic polyneuropathy: Secondary | ICD-10-CM | POA: Diagnosis not present

## 2021-01-15 DIAGNOSIS — F419 Anxiety disorder, unspecified: Secondary | ICD-10-CM | POA: Diagnosis not present

## 2021-01-15 DIAGNOSIS — N3000 Acute cystitis without hematuria: Secondary | ICD-10-CM | POA: Diagnosis not present

## 2021-01-15 DIAGNOSIS — I160 Hypertensive urgency: Secondary | ICD-10-CM | POA: Diagnosis not present

## 2021-01-15 DIAGNOSIS — E1165 Type 2 diabetes mellitus with hyperglycemia: Secondary | ICD-10-CM | POA: Diagnosis not present

## 2021-01-15 DIAGNOSIS — I10 Essential (primary) hypertension: Secondary | ICD-10-CM | POA: Diagnosis not present

## 2021-01-15 DIAGNOSIS — K219 Gastro-esophageal reflux disease without esophagitis: Secondary | ICD-10-CM | POA: Diagnosis not present

## 2021-01-16 DIAGNOSIS — D649 Anemia, unspecified: Secondary | ICD-10-CM | POA: Diagnosis not present

## 2021-01-16 DIAGNOSIS — K219 Gastro-esophageal reflux disease without esophagitis: Secondary | ICD-10-CM | POA: Diagnosis not present

## 2021-01-16 DIAGNOSIS — I160 Hypertensive urgency: Secondary | ICD-10-CM | POA: Diagnosis not present

## 2021-01-16 DIAGNOSIS — F419 Anxiety disorder, unspecified: Secondary | ICD-10-CM | POA: Diagnosis not present

## 2021-01-16 DIAGNOSIS — E1165 Type 2 diabetes mellitus with hyperglycemia: Secondary | ICD-10-CM | POA: Diagnosis not present

## 2021-01-16 DIAGNOSIS — E1142 Type 2 diabetes mellitus with diabetic polyneuropathy: Secondary | ICD-10-CM | POA: Diagnosis not present

## 2021-01-16 DIAGNOSIS — I10 Essential (primary) hypertension: Secondary | ICD-10-CM | POA: Diagnosis not present

## 2021-01-16 DIAGNOSIS — E785 Hyperlipidemia, unspecified: Secondary | ICD-10-CM | POA: Diagnosis not present

## 2021-01-16 DIAGNOSIS — N3000 Acute cystitis without hematuria: Secondary | ICD-10-CM | POA: Diagnosis not present

## 2021-01-18 DIAGNOSIS — K219 Gastro-esophageal reflux disease without esophagitis: Secondary | ICD-10-CM | POA: Diagnosis not present

## 2021-01-18 DIAGNOSIS — N3000 Acute cystitis without hematuria: Secondary | ICD-10-CM | POA: Diagnosis not present

## 2021-01-18 DIAGNOSIS — E1142 Type 2 diabetes mellitus with diabetic polyneuropathy: Secondary | ICD-10-CM | POA: Diagnosis not present

## 2021-01-18 DIAGNOSIS — E1165 Type 2 diabetes mellitus with hyperglycemia: Secondary | ICD-10-CM | POA: Diagnosis not present

## 2021-01-18 DIAGNOSIS — F419 Anxiety disorder, unspecified: Secondary | ICD-10-CM | POA: Diagnosis not present

## 2021-01-18 DIAGNOSIS — E785 Hyperlipidemia, unspecified: Secondary | ICD-10-CM | POA: Diagnosis not present

## 2021-01-18 DIAGNOSIS — I10 Essential (primary) hypertension: Secondary | ICD-10-CM | POA: Diagnosis not present

## 2021-01-18 DIAGNOSIS — I160 Hypertensive urgency: Secondary | ICD-10-CM | POA: Diagnosis not present

## 2021-01-18 DIAGNOSIS — D649 Anemia, unspecified: Secondary | ICD-10-CM | POA: Diagnosis not present

## 2021-01-21 DIAGNOSIS — K219 Gastro-esophageal reflux disease without esophagitis: Secondary | ICD-10-CM | POA: Diagnosis not present

## 2021-01-21 DIAGNOSIS — I10 Essential (primary) hypertension: Secondary | ICD-10-CM | POA: Diagnosis not present

## 2021-01-21 DIAGNOSIS — E7801 Familial hypercholesterolemia: Secondary | ICD-10-CM | POA: Diagnosis not present

## 2021-01-21 DIAGNOSIS — E1140 Type 2 diabetes mellitus with diabetic neuropathy, unspecified: Secondary | ICD-10-CM | POA: Diagnosis not present

## 2021-01-21 DIAGNOSIS — E039 Hypothyroidism, unspecified: Secondary | ICD-10-CM | POA: Diagnosis not present

## 2021-01-22 DIAGNOSIS — E785 Hyperlipidemia, unspecified: Secondary | ICD-10-CM | POA: Diagnosis not present

## 2021-01-22 DIAGNOSIS — N3000 Acute cystitis without hematuria: Secondary | ICD-10-CM | POA: Diagnosis not present

## 2021-01-22 DIAGNOSIS — I160 Hypertensive urgency: Secondary | ICD-10-CM | POA: Diagnosis not present

## 2021-01-22 DIAGNOSIS — D649 Anemia, unspecified: Secondary | ICD-10-CM | POA: Diagnosis not present

## 2021-01-22 DIAGNOSIS — I10 Essential (primary) hypertension: Secondary | ICD-10-CM | POA: Diagnosis not present

## 2021-01-22 DIAGNOSIS — E1165 Type 2 diabetes mellitus with hyperglycemia: Secondary | ICD-10-CM | POA: Diagnosis not present

## 2021-01-22 DIAGNOSIS — E1142 Type 2 diabetes mellitus with diabetic polyneuropathy: Secondary | ICD-10-CM | POA: Diagnosis not present

## 2021-01-22 DIAGNOSIS — K219 Gastro-esophageal reflux disease without esophagitis: Secondary | ICD-10-CM | POA: Diagnosis not present

## 2021-01-22 DIAGNOSIS — F419 Anxiety disorder, unspecified: Secondary | ICD-10-CM | POA: Diagnosis not present

## 2021-01-23 DIAGNOSIS — E7801 Familial hypercholesterolemia: Secondary | ICD-10-CM | POA: Diagnosis not present

## 2021-01-23 DIAGNOSIS — E1140 Type 2 diabetes mellitus with diabetic neuropathy, unspecified: Secondary | ICD-10-CM | POA: Diagnosis not present

## 2021-01-23 DIAGNOSIS — K219 Gastro-esophageal reflux disease without esophagitis: Secondary | ICD-10-CM | POA: Diagnosis not present

## 2021-01-23 DIAGNOSIS — E039 Hypothyroidism, unspecified: Secondary | ICD-10-CM | POA: Diagnosis not present

## 2021-01-23 DIAGNOSIS — I10 Essential (primary) hypertension: Secondary | ICD-10-CM | POA: Diagnosis not present

## 2021-01-28 ENCOUNTER — Other Ambulatory Visit: Payer: Medicare PPO | Admitting: Neurology

## 2021-01-28 DIAGNOSIS — R410 Disorientation, unspecified: Secondary | ICD-10-CM

## 2021-01-29 DIAGNOSIS — E785 Hyperlipidemia, unspecified: Secondary | ICD-10-CM | POA: Diagnosis not present

## 2021-01-29 DIAGNOSIS — G459 Transient cerebral ischemic attack, unspecified: Secondary | ICD-10-CM | POA: Diagnosis not present

## 2021-01-29 DIAGNOSIS — I10 Essential (primary) hypertension: Secondary | ICD-10-CM | POA: Diagnosis not present

## 2021-01-29 DIAGNOSIS — Z6825 Body mass index (BMI) 25.0-25.9, adult: Secondary | ICD-10-CM | POA: Diagnosis not present

## 2021-01-29 DIAGNOSIS — N3000 Acute cystitis without hematuria: Secondary | ICD-10-CM | POA: Diagnosis not present

## 2021-01-29 DIAGNOSIS — K219 Gastro-esophageal reflux disease without esophagitis: Secondary | ICD-10-CM | POA: Diagnosis not present

## 2021-01-29 DIAGNOSIS — D649 Anemia, unspecified: Secondary | ICD-10-CM | POA: Diagnosis not present

## 2021-01-29 DIAGNOSIS — Z6826 Body mass index (BMI) 26.0-26.9, adult: Secondary | ICD-10-CM | POA: Diagnosis not present

## 2021-01-29 DIAGNOSIS — I160 Hypertensive urgency: Secondary | ICD-10-CM | POA: Diagnosis not present

## 2021-01-29 DIAGNOSIS — F419 Anxiety disorder, unspecified: Secondary | ICD-10-CM | POA: Diagnosis not present

## 2021-01-29 DIAGNOSIS — D529 Folate deficiency anemia, unspecified: Secondary | ICD-10-CM | POA: Diagnosis not present

## 2021-01-29 DIAGNOSIS — D519 Vitamin B12 deficiency anemia, unspecified: Secondary | ICD-10-CM | POA: Diagnosis not present

## 2021-01-29 DIAGNOSIS — E1142 Type 2 diabetes mellitus with diabetic polyneuropathy: Secondary | ICD-10-CM | POA: Diagnosis not present

## 2021-01-29 DIAGNOSIS — E7801 Familial hypercholesterolemia: Secondary | ICD-10-CM | POA: Diagnosis not present

## 2021-01-29 DIAGNOSIS — R413 Other amnesia: Secondary | ICD-10-CM | POA: Diagnosis not present

## 2021-01-29 DIAGNOSIS — E1165 Type 2 diabetes mellitus with hyperglycemia: Secondary | ICD-10-CM | POA: Diagnosis not present

## 2021-01-30 DIAGNOSIS — I10 Essential (primary) hypertension: Secondary | ICD-10-CM | POA: Diagnosis not present

## 2021-01-30 DIAGNOSIS — D649 Anemia, unspecified: Secondary | ICD-10-CM | POA: Diagnosis not present

## 2021-01-30 DIAGNOSIS — K219 Gastro-esophageal reflux disease without esophagitis: Secondary | ICD-10-CM | POA: Diagnosis not present

## 2021-01-30 DIAGNOSIS — F419 Anxiety disorder, unspecified: Secondary | ICD-10-CM | POA: Diagnosis not present

## 2021-01-30 DIAGNOSIS — E1165 Type 2 diabetes mellitus with hyperglycemia: Secondary | ICD-10-CM | POA: Diagnosis not present

## 2021-01-30 DIAGNOSIS — E1142 Type 2 diabetes mellitus with diabetic polyneuropathy: Secondary | ICD-10-CM | POA: Diagnosis not present

## 2021-01-30 DIAGNOSIS — I160 Hypertensive urgency: Secondary | ICD-10-CM | POA: Diagnosis not present

## 2021-01-30 DIAGNOSIS — N3000 Acute cystitis without hematuria: Secondary | ICD-10-CM | POA: Diagnosis not present

## 2021-01-30 DIAGNOSIS — E785 Hyperlipidemia, unspecified: Secondary | ICD-10-CM | POA: Diagnosis not present

## 2021-01-30 NOTE — Progress Notes (Signed)
Kindly inform the patient that EEG study was normal

## 2021-01-31 ENCOUNTER — Telehealth: Payer: Self-pay | Admitting: Emergency Medicine

## 2021-01-31 DIAGNOSIS — I10 Essential (primary) hypertension: Secondary | ICD-10-CM | POA: Diagnosis not present

## 2021-01-31 DIAGNOSIS — K219 Gastro-esophageal reflux disease without esophagitis: Secondary | ICD-10-CM | POA: Diagnosis not present

## 2021-01-31 DIAGNOSIS — E1165 Type 2 diabetes mellitus with hyperglycemia: Secondary | ICD-10-CM | POA: Diagnosis not present

## 2021-01-31 DIAGNOSIS — N3000 Acute cystitis without hematuria: Secondary | ICD-10-CM | POA: Diagnosis not present

## 2021-01-31 DIAGNOSIS — E785 Hyperlipidemia, unspecified: Secondary | ICD-10-CM | POA: Diagnosis not present

## 2021-01-31 DIAGNOSIS — I160 Hypertensive urgency: Secondary | ICD-10-CM | POA: Diagnosis not present

## 2021-01-31 DIAGNOSIS — F419 Anxiety disorder, unspecified: Secondary | ICD-10-CM | POA: Diagnosis not present

## 2021-01-31 DIAGNOSIS — D649 Anemia, unspecified: Secondary | ICD-10-CM | POA: Diagnosis not present

## 2021-01-31 DIAGNOSIS — E1142 Type 2 diabetes mellitus with diabetic polyneuropathy: Secondary | ICD-10-CM | POA: Diagnosis not present

## 2021-01-31 NOTE — Telephone Encounter (Signed)
Called patient and discussed Dr. Clydene Fake review and findings of patient's EEG.  Patient denied further questions, verbalized understanding and expressed appreciation for the phone call.

## 2021-01-31 NOTE — Telephone Encounter (Signed)
-----   Message from Garvin Fila, MD sent at 01/30/2021  7:01 AM EDT ----- [name] [name] inform the patient that EEG study was normal

## 2021-02-04 DIAGNOSIS — N3000 Acute cystitis without hematuria: Secondary | ICD-10-CM | POA: Diagnosis not present

## 2021-02-04 DIAGNOSIS — D649 Anemia, unspecified: Secondary | ICD-10-CM | POA: Diagnosis not present

## 2021-02-04 DIAGNOSIS — I10 Essential (primary) hypertension: Secondary | ICD-10-CM | POA: Diagnosis not present

## 2021-02-04 DIAGNOSIS — E1165 Type 2 diabetes mellitus with hyperglycemia: Secondary | ICD-10-CM | POA: Diagnosis not present

## 2021-02-04 DIAGNOSIS — K219 Gastro-esophageal reflux disease without esophagitis: Secondary | ICD-10-CM | POA: Diagnosis not present

## 2021-02-04 DIAGNOSIS — E1142 Type 2 diabetes mellitus with diabetic polyneuropathy: Secondary | ICD-10-CM | POA: Diagnosis not present

## 2021-02-04 DIAGNOSIS — R300 Dysuria: Secondary | ICD-10-CM | POA: Diagnosis not present

## 2021-02-04 DIAGNOSIS — I160 Hypertensive urgency: Secondary | ICD-10-CM | POA: Diagnosis not present

## 2021-02-04 DIAGNOSIS — F419 Anxiety disorder, unspecified: Secondary | ICD-10-CM | POA: Diagnosis not present

## 2021-02-04 DIAGNOSIS — E785 Hyperlipidemia, unspecified: Secondary | ICD-10-CM | POA: Diagnosis not present

## 2021-02-06 ENCOUNTER — Ambulatory Visit: Payer: Medicare PPO | Admitting: Physician Assistant

## 2021-02-06 ENCOUNTER — Other Ambulatory Visit (INDEPENDENT_AMBULATORY_CARE_PROVIDER_SITE_OTHER): Payer: Medicare PPO

## 2021-02-06 ENCOUNTER — Encounter: Payer: Self-pay | Admitting: Physician Assistant

## 2021-02-06 VITALS — BP 174/70 | HR 51 | Ht 62.0 in | Wt 143.0 lb

## 2021-02-06 DIAGNOSIS — K529 Noninfective gastroenteritis and colitis, unspecified: Secondary | ICD-10-CM | POA: Diagnosis not present

## 2021-02-06 DIAGNOSIS — D649 Anemia, unspecified: Secondary | ICD-10-CM | POA: Diagnosis not present

## 2021-02-06 LAB — CBC WITH DIFFERENTIAL/PLATELET
Basophils Absolute: 0.1 10*3/uL (ref 0.0–0.1)
Basophils Relative: 1.1 % (ref 0.0–3.0)
Eosinophils Absolute: 0.3 10*3/uL (ref 0.0–0.7)
Eosinophils Relative: 4.3 % (ref 0.0–5.0)
HCT: 26.7 % — ABNORMAL LOW (ref 36.0–46.0)
Hemoglobin: 9 g/dL — ABNORMAL LOW (ref 12.0–15.0)
Lymphocytes Relative: 27.2 % (ref 12.0–46.0)
Lymphs Abs: 1.8 10*3/uL (ref 0.7–4.0)
MCHC: 33.5 g/dL (ref 30.0–36.0)
MCV: 85.9 fl (ref 78.0–100.0)
Monocytes Absolute: 0.7 10*3/uL (ref 0.1–1.0)
Monocytes Relative: 11.4 % (ref 3.0–12.0)
Neutro Abs: 3.6 10*3/uL (ref 1.4–7.7)
Neutrophils Relative %: 56 % (ref 43.0–77.0)
Platelets: 203 10*3/uL (ref 150.0–400.0)
RBC: 3.11 Mil/uL — ABNORMAL LOW (ref 3.87–5.11)
RDW: 14.7 % (ref 11.5–15.5)
WBC: 6.5 10*3/uL (ref 4.0–10.5)

## 2021-02-06 MED ORDER — COLESTIPOL HCL 1 G PO TABS: 1.0000 g | ORAL_TABLET | Freq: Two times a day (BID) | ORAL | 3 refills | Status: DC

## 2021-02-06 NOTE — Patient Instructions (Signed)
If you are age 83 or older, your body mass index should be between 23-30. Your Body mass index is 26.16 kg/m. If this is out of the aforementioned range listed, please consider follow up with your Primary Care Provider.  If you are age 70 or younger, your body mass index should be between 19-25. Your Body mass index is 26.16 kg/m. If this is out of the aformentioned range listed, please consider follow up with your Primary Care Provider.   We have sent the following medications to your pharmacy for you to pick up at your convenience: Colestipol   Your provider has requested that you go to the basement level for lab work before leaving today. Press "B" on the elevator. The lab is located at the first door on the left as you exit the elevator.  Due to recent changes in healthcare laws, you may see the results of your imaging and laboratory studies on MyChart before your provider has had a chance to review them.  We understand that in some cases there may be results that are confusing or concerning to you. Not all laboratory results come back in the same time frame and the provider may be waiting for multiple results in order to interpret others.  Please give [place] 48 hours in order for your provider to thoroughly review all the results before contacting the office for clarification of your results.   Thank you for choosing me and [facility].  Ellouise Newer, PA-C

## 2021-02-06 NOTE — Progress Notes (Signed)
Chief Complaint: Anemia  HPI:    [name] [name] is an 85 year old female with a past medical history as listed below including anemia, known to Dr. Tarri Glenn, who was referred to me by [facility] for a complaint of anemia.      08/01/2020 EGD with gastritis, duodenal diverticulum.  Biopsies showed reactive gastropathy with mild chronic inflammation.    08/20/2020 stable small cystic lesion in the pancreatic head, unchanged from recent CT.    09/11/2020 patient seen in clinic for follow-up of chronic diarrhea.  Work-up at that time included normal TSH, normal celiac testing, pancreatic elastase and GI pathogen panel, CBC with hemoglobin of 10.5.  CT abdomen pelvis with contrast 07/18/20 with prominent soft tissue of the posterior aspect of the upper to mid stomach, small right renal angiomyolipoma, minimal intrahepatic biliary dilation, 11 x 8 x 8 mm cystic lesion of the pancreatic head, recommend follow-up pre and postcontrast MRI/MRCP or pancreatic protocol CT in 2 years.  At that time diarrhea had resolved after completing 14 days of Xifaxan using colestipol 1 g twice daily.  At that time as discussed she had normocytic anemia without overt bleeding.  Labs 07/09/2020 iron 56, ferritin 47.5.    01/29/2021 patient saw PCP and at that time was discussed she had chronic anemia.  It was noted that her hemoglobin was 9.3 on 4/22 and a year ago it was 10.    01/23/2021 CMP with a glucose elevated at 140, creatinine 1.04, normal BUN.  CBC with a hemoglobin of 9.3, MCV normal.    01/29/2021 iron panel was normal.  Repeat CBC with hemoglobin of 8.6, MCV normal.  Ferritin 58.  Reticulocyte count normal.  At that time they were told to stop aspirin for the patient and be referred to our office.    Today, the patient presents to clinic accompanied by her husband and daughter who do assist with her history.  They explain that her hemoglobin has been dropping and that is why she is here.  Patient explains that she  really has no symptoms at all, maybe a slight bit of increased fatigue, but no GI changes.    Denies fever, chills, weight loss, change in bowel habits, abdominal pain, heartburn, reflux, nausea, vomiting or blood in her stool.  Prior endoscopic history: - Colonoscopy with Dr. Britta Mccreedy 11/28/2015 revealed a sigmoid colon semipedunculated hyperplastic polyp.  Random colon biopsies were normal.  There was no evidence of lymphocytic or collagenous colitis - Colonoscopy with Dr. Gala Romney in 2019 revealed diverticulosis and internal hemorrhoids.  Biopsies from the right and left colon were normal. - EGD 08/01/20: Gastritis, duodenal diverticulum. Biopsies showed reactive gastropathy with mild chronic inflammation. No H pylori.   Past Medical History:  Diagnosis Date  . Anemia   . Anxiety   . Arthritis   . Chronic back pain   . Chronic diarrhea   . Diabetes ([place])   . Diverticulosis   . GERD (gastroesophageal reflux disease)   . Hyperlipidemia   . Hypertension   . Internal hemorrhoids   . UTI (urinary tract infection)     Past Surgical History:  Procedure Laterality Date  . ABDOMINAL HYSTERECTOMY    . CARDIAC CATHETERIZATION    . CATARACT EXTRACTION, BILATERAL    . CHOLECYSTECTOMY    . COLONOSCOPY  11/2015   Dr. Britta Mccreedy: hyperplastic polyp from sigmoid colon. biopsies from ascending colon were negative for microscopic colitis.   . COLONOSCOPY N/A 03/31/2018   Dr. Gala Romney: Diverticulosis, random  colon biopsies negative, hemorrhoids.  No future screening or surveillance colonoscopy is recommended.  . TONSILLECTOMY    . TRIGGER FINGER RELEASE      Current Outpatient Medications  Medication Sig Dispense Refill  . ACCU-CHEK GUIDE test strip USE AS DIRECTED THREE TIMES DAILY 150 strip 3  . aspirin EC 81 MG tablet Take 81 mg by mouth daily.    Marland Kitchen atorvastatin (LIPITOR) 40 MG tablet Take 20 mg by mouth daily.     . Calcium Citrate-Vitamin D 200-250 MG-UNIT TABS Take 1 tablet by mouth daily.    .  colestipol (COLESTID) 1 g tablet TAKE 1 TABLET BY MOUTH TWICE DAILY 60 tablet 0  . COMFORT EZ PEN NEEDLES 31G X 5 MM MISC     . Cyanocobalamin (B-12) 1000 MCG TABS Take 1,000 mcg by mouth daily.     . diclofenac sodium (VOLTAREN) 1 % GEL Apply 2 g topically at bedtime.    . DULoxetine (CYMBALTA) 60 MG capsule Take 60 mg by mouth daily.    . enalapril (VASOTEC) 20 MG tablet Take 20 mg by mouth daily.    . famotidine (PEPCID) 20 MG tablet Take 40 mg by mouth daily.    . Insulin Glargine-Lixisenatide (SOLIQUA) 100-33 UNT-MCG/ML SOPN Inject 18 Units into the skin every morning.    . labetalol (NORMODYNE) 200 MG tablet Take 200 mg by mouth 2 (two) times daily.    . Lactobacillus Rhamnosus, GG, (RA PROBIOTIC DIGESTIVE CARE PO) Take 1 capsule by mouth daily.    . magnesium oxide (MAG-OX) 400 MG tablet Take 400 mg by mouth daily.    . memantine (NAMENDA TITRATION PAK) tablet pack 5 mg/day for =1 week; 5 mg twice daily for =1 week; 15 mg/day given in 5 mg and 10 mg separated doses for =1 week; then 10 mg twice daily 49 tablet 12  . memantine (NAMENDA) 10 MG tablet Take 1 tablet (10 mg total) by mouth 2 (two) times daily. Start after finishing namenda starter pack 60 tablet 3  . Multiple Vitamins-Minerals (CENTRUM) tablet Take 1 tablet by mouth daily.    . sertraline (ZOLOFT) 50 MG tablet 25 mg daily.     No current facility-administered medications for this visit.    Allergies as of 02/06/2021  . (No Known Allergies)    Family History  Problem Relation Age of Onset  . Heart attack Sister   . Transient ischemic attack Sister   . Heart attack Mother   . Diabetes Mother   . Transient ischemic attack Mother   . Heart attack Father   . Diabetes Son   . Diabetes Maternal Aunt   . Rectal cancer Other        anal CA  . Colon cancer Neg Hx   . Celiac disease Neg Hx   . Inflammatory bowel disease Neg Hx   . Stomach cancer Neg Hx     Social History   Socioeconomic History  . Marital status:  Married    Spouse name: Ashok Croon  . Number of children: 3  . Years of education: Not on file  . Highest education level: Not on file  Occupational History  . Not on file  Tobacco Use  . Smoking status: Never Smoker  . Smokeless tobacco: Never Used  Vaping Use  . Vaping Use: Never used  Substance and Sexual Activity  . Alcohol use: No    Alcohol/week: 0.0 standard drinks  . Drug use: No  . Sexual activity: Not on file  Other Topics Concern  . Not on file  Social History Narrative   Lives with husband   Right Handed   Drinks 1-2 cups caffeine daily   Social Determinants of Health   Financial Resource Strain: Not on file  Food Insecurity: Not on file  Transportation Needs: Not on file  Physical Activity: Not on file  Stress: Not on file  Social Connections: Not on file  Intimate Partner Violence: Not on file    Review of Systems:    Constitutional: No weight loss, fever or chills Cardiovascular: No chest pain Respiratory: No SOB Gastrointestinal: See HPI and otherwise negative   Physical Exam:  Vital signs: BP (!) 174/70   Pulse (!) 51   Ht 5\' 2"  (1.575 m)   Wt 143 lb (64.9 kg)   SpO2 99%   BMI 26.16 kg/m   Constitutional:   Pleasant Elderly Caucasian female appears to be in NAD, Well developed, Well nourished, alert and cooperative Respiratory: Respirations even and unlabored. Lungs clear to auscultation bilaterally.   No wheezes, crackles, or rhonchi.  Cardiovascular: Normal S1, S2. No MRG. Regular rate and rhythm. No peripheral edema, cyanosis or pallor.  Gastrointestinal:  Soft, nondistended, nontender. No rebound or guarding. Normal bowel sounds. No appreciable masses or hepatomegaly. Rectal:  Not performed.  Msk:  Symmetrical without gross deformities. Without edema, no deformity or joint abnormality. +ambulates with cane Psychiatric: Oriented to person, place and time. Demonstrates good judgement and reason without abnormal affect or behaviors.  RELEVANT  LABS AND IMAGING (and see HPI): CBC    Component Value Date/Time   WBC 8.6 12/06/2020 1802   RBC 3.98 12/06/2020 1802   HGB 11.9 (L) 12/06/2020 1823   HCT 35.0 (L) 12/06/2020 1823   PLT 206 12/06/2020 1802   MCV 88.7 12/06/2020 1802   MCH 28.9 12/06/2020 1802   MCHC 32.6 12/06/2020 1802   RDW 13.5 12/06/2020 1802   LYMPHSABS 1.6 12/06/2020 1802   MONOABS 0.8 12/06/2020 1802   EOSABS 0.1 12/06/2020 1802   BASOSABS 0.1 12/06/2020 1802    CMP     Component Value Date/Time   NA 141 12/06/2020 1823   K 4.2 12/06/2020 1823   CL 105 12/06/2020 1823   CO2 23 12/06/2020 1802   GLUCOSE 101 (H) 12/06/2020 1823   BUN 28 (H) 12/06/2020 1823   CREATININE 0.90 12/06/2020 1823   CREATININE 1.07 (H) 12/02/2017 1211   CALCIUM 9.8 12/06/2020 1802   PROT 7.8 12/06/2020 1802   ALBUMIN 4.7 12/06/2020 1802   AST 20 12/06/2020 1802   ALT 21 12/06/2020 1802   ALKPHOS 91 12/06/2020 1802   BILITOT 0.6 12/06/2020 1802   GFRNONAA >60 12/06/2020 1802   GFRAA >60 10/16/2017 0730    Assessment: 1.  Normocytic anemia: Hemoglobin on 12/06/2020 11.9--> 01/23/2021 9.3--> 01/29/2021 8.6, iron levels and ferritin as well as B12 low normal, patient uninterested in further GI work-up; consider hematologic cause versus GI source of blood loss versus other 2.  Chronic diarrhea: Resolved after a dose of Xifaxan and now colestipol  Plan: 1.  Discussed with patient and family that the next step in work-up would be a colonoscopy given that she recently had an EGD with no abnormalities.  The patient declines at this point.  Tells me that she really does not want to have another colonoscopy.  Explained reasons behind wanting to do this procedure and she still declines. 2.  At this point we will recheck CBC today. 3.  Referred patient  to a hematologist given drop in hemoglobin. 4.  Recommend that even though B12 and iron levels are normal mole they are towards the low side, would recommend they go ahead and pick up  over-the-counter B12 and iron supplementation to start daily until they see hematology. 5.  Refilled Colestipol with a 90-day supply and 3 refills 6.  Patient to follow in clinic with [place] as needed in the future.  Ellouise Newer, PA-C [facility] 02/06/2021, 3:36 PM  Cc: [facility]

## 2021-02-07 ENCOUNTER — Telehealth: Payer: Self-pay | Admitting: Hematology and Oncology

## 2021-02-07 NOTE — Telephone Encounter (Signed)
Received a new hem referral from [facility] for normocytic anemia. Ms. [name] [name] has been cld and scheduled to see Dr. Chryl Heck on 5/16 at 1040am. Pt aware to arrive 20 minutes early.

## 2021-02-09 ENCOUNTER — Other Ambulatory Visit: Payer: Self-pay | Admitting: Endocrinology

## 2021-02-11 ENCOUNTER — Inpatient Hospital Stay: Payer: Medicare PPO

## 2021-02-11 ENCOUNTER — Inpatient Hospital Stay: Payer: Medicare PPO | Attending: Hematology and Oncology | Admitting: Hematology and Oncology

## 2021-02-11 ENCOUNTER — Telehealth: Payer: Self-pay

## 2021-02-11 ENCOUNTER — Other Ambulatory Visit: Payer: Self-pay

## 2021-02-11 ENCOUNTER — Encounter: Payer: Self-pay | Admitting: Hematology and Oncology

## 2021-02-11 VITALS — BP 198/76 | HR 70 | Temp 97.7°F | Resp 18

## 2021-02-11 DIAGNOSIS — Z8719 Personal history of other diseases of the digestive system: Secondary | ICD-10-CM | POA: Insufficient documentation

## 2021-02-11 DIAGNOSIS — E119 Type 2 diabetes mellitus without complications: Secondary | ICD-10-CM | POA: Diagnosis not present

## 2021-02-11 DIAGNOSIS — Z9049 Acquired absence of other specified parts of digestive tract: Secondary | ICD-10-CM | POA: Insufficient documentation

## 2021-02-11 DIAGNOSIS — Z79899 Other long term (current) drug therapy: Secondary | ICD-10-CM | POA: Diagnosis not present

## 2021-02-11 DIAGNOSIS — Z800 Family history of malignant neoplasm of digestive organs: Secondary | ICD-10-CM | POA: Insufficient documentation

## 2021-02-11 DIAGNOSIS — E538 Deficiency of other specified B group vitamins: Secondary | ICD-10-CM

## 2021-02-11 DIAGNOSIS — E785 Hyperlipidemia, unspecified: Secondary | ICD-10-CM | POA: Diagnosis not present

## 2021-02-11 DIAGNOSIS — Z833 Family history of diabetes mellitus: Secondary | ICD-10-CM | POA: Insufficient documentation

## 2021-02-11 DIAGNOSIS — Z87440 Personal history of urinary (tract) infections: Secondary | ICD-10-CM | POA: Diagnosis not present

## 2021-02-11 DIAGNOSIS — R630 Anorexia: Secondary | ICD-10-CM | POA: Diagnosis not present

## 2021-02-11 DIAGNOSIS — I10 Essential (primary) hypertension: Secondary | ICD-10-CM | POA: Diagnosis not present

## 2021-02-11 DIAGNOSIS — D649 Anemia, unspecified: Secondary | ICD-10-CM | POA: Diagnosis not present

## 2021-02-11 DIAGNOSIS — Z8249 Family history of ischemic heart disease and other diseases of the circulatory system: Secondary | ICD-10-CM | POA: Insufficient documentation

## 2021-02-11 LAB — CBC WITH DIFFERENTIAL/PLATELET
Abs Immature Granulocytes: 0.02 10*3/uL (ref 0.00–0.07)
Basophils Absolute: 0 10*3/uL (ref 0.0–0.1)
Basophils Relative: 1 %
Eosinophils Absolute: 0.2 10*3/uL (ref 0.0–0.5)
Eosinophils Relative: 4 %
HCT: 27.5 % — ABNORMAL LOW (ref 36.0–46.0)
Hemoglobin: 8.8 g/dL — ABNORMAL LOW (ref 12.0–15.0)
Immature Granulocytes: 0 %
Lymphocytes Relative: 29 %
Lymphs Abs: 1.7 10*3/uL (ref 0.7–4.0)
MCH: 28.7 pg (ref 26.0–34.0)
MCHC: 32 g/dL (ref 30.0–36.0)
MCV: 89.6 fL (ref 80.0–100.0)
Monocytes Absolute: 0.7 10*3/uL (ref 0.1–1.0)
Monocytes Relative: 12 %
Neutro Abs: 3.3 10*3/uL (ref 1.7–7.7)
Neutrophils Relative %: 54 %
Platelets: 203 10*3/uL (ref 150–400)
RBC: 3.07 MIL/uL — ABNORMAL LOW (ref 3.87–5.11)
RDW: 14.4 % (ref 11.5–15.5)
WBC: 5.9 10*3/uL (ref 4.0–10.5)
nRBC: 0 % (ref 0.0–0.2)

## 2021-02-11 LAB — IRON AND TIBC
Iron: 40 ug/dL — ABNORMAL LOW (ref 41–142)
Saturation Ratios: 11 % — ABNORMAL LOW (ref 21–57)
TIBC: 370 ug/dL (ref 236–444)
UIBC: 330 ug/dL (ref 120–384)

## 2021-02-11 LAB — VITAMIN B12: Vitamin B-12: 620 pg/mL (ref 180–914)

## 2021-02-11 LAB — FERRITIN: Ferritin: 37 ng/mL (ref 11–307)

## 2021-02-11 NOTE — Progress Notes (Signed)
[place] CONSULT NOTE  Patient Care Team: [facility] as PCP - General Rourk, Cristopher Estimable, MD as Consulting Physician (Gastroenterology)  CHIEF COMPLAINTS/PURPOSE OF CONSULTATION:  Normocytic normochromic anemia.  ASSESSMENT & PLAN:   Normocytic normochromic anemia This is a very pleasant 85 year old female patient with past medical history significant for type 2 diabetes, hypertension, dyslipidemia recently admitted with confusion and urinary tract infection now on antibiotics referred to hematology for further evaluation of worsening anemia.  [name] [name] denies any new complaints except some loss of appetite in the past 2 or 3 weeks while she was taking her antibiotics.  Besides loss of appetite, she feels well for her age.  She denies any change in her bowel habits or urinary habits.  She has most recently seen her gastroenterologist, had colonoscopy last in 2019 without any obvious findings, had endoscopy recently in November with no obvious findings. She has most recently started her iron and B12 supplementation back.  She is very reluctant to do another colonoscopy at this time.  We have discussed about common causes of anemia including but not limited to nutritional deficiencies, blood loss anemia, bone marrow disorder such as myelodysplasia, occasionally multiple myeloma.  I recommended further lab investigation today, continue oral iron and B12 supplementation and return to clinic in about 8 weeks for follow-up. I will give her daughter a call and recommend to consider colonoscopy since her hemoglobin is slowly dropping.   Orders Placed This Encounter  Procedures  . CBC with Differential/Platelet    Standing Status:   Standing    Number of Occurrences:   22    Standing Expiration Date:   02/11/2022  . Iron and TIBC    Standing Status:   Future    Number of Occurrences:   1    Standing Expiration Date:   02/11/2022  . Ferritin    Standing Status:   Future     Number of Occurrences:   1    Standing Expiration Date:   02/11/2022  . Vitamin B12    Standing Status:   Future    Number of Occurrences:   1    Standing Expiration Date:   02/11/2022  . Folate RBC    Standing Status:   Future    Number of Occurrences:   1    Standing Expiration Date:   02/11/2022  . SPEP with reflex to IFE    Standing Status:   Future    Number of Occurrences:   1    Standing Expiration Date:   02/11/2022  . Ambulatory referral to Social Work    Referral Priority:   Routine    Referral Type:   Consultation    Referral Reason:   Specialty Services Required    Number of Visits Requested:   1     HISTORY OF PRESENTING ILLNESS:  [name] [name] 85 y.o. female is here because of normocytic normochromic anemia.  [name] [name] is a very pleasant 85 year old female patient who arrived to the appointment today with her daughter for evaluation of anemia.  According to patient and her daughter, her anemia is longstanding and she has always had issues with anemia.  She most recently she switched her primary care physicians and she was recommended to have further evaluation for her worsening anemia hence went to see her gastroenterologist as well as hematology appointment.  She denies any changes in her stools, hematochezia or melena.  She had her last colonoscopy in 2019  which showed diverticulosis and internal hemorrhoids.  Biopsies from the right and left colon were normal at that time.  She had endoscopy in November 2021 which showed gastritis, duodenal diverticulum, biopsy showed reactive gastropathy with mild chronic inflammation.  No H. pylori. She has just recently restarted iron supplementation and B12 supplementation.  She does not recollect needing any blood transfusions in the past.  She tells me that she has lost her appetite in the past 2 or 3 weeks since she had her urinary tract infection, recently completed antibiotics, otherwise she feels well.  No concerning review of  systems.  REVIEW OF SYSTEMS:   Constitutional: Denies fevers, chills  Ears, nose, mouth, throat, and face: Denies mucositis or sore throat Respiratory: Denies cough, dyspnea or wheezes Cardiovascular: Denies palpitation, chest discomfort or lower extremity swelling Gastrointestinal: She had chronic diarrhea which has resolved.   Skin: Denies abnormal skin rashes Lymphatics: Denies new lymphadenopathy or easy bruising Neurological:Denies numbness, tingling or new weaknesses Behavioral/Psych: Mood is stable, no new changes  All other systems were reviewed with the patient and are negative.  MEDICAL HISTORY:  Past Medical History:  Diagnosis Date  . Anemia   . Anxiety   . Arthritis   . Chronic back pain   . Chronic diarrhea   . Diabetes ([place])   . Diverticulosis   . GERD (gastroesophageal reflux disease)   . Hyperlipidemia   . Hypertension   . Internal hemorrhoids   . UTI (urinary tract infection)     SURGICAL HISTORY: Past Surgical History:  Procedure Laterality Date  . ABDOMINAL HYSTERECTOMY    . CARDIAC CATHETERIZATION    . CATARACT EXTRACTION, BILATERAL    . CHOLECYSTECTOMY    . COLONOSCOPY  11/2015   Dr. Britta Mccreedy: hyperplastic polyp from sigmoid colon. biopsies from ascending colon were negative for microscopic colitis.   . COLONOSCOPY N/A 03/31/2018   Dr. Gala Romney: Diverticulosis, random colon biopsies negative, hemorrhoids.  No future screening or surveillance colonoscopy is recommended.  . TONSILLECTOMY    . TRIGGER FINGER RELEASE      SOCIAL HISTORY: Social History   Socioeconomic History  . Marital status: Married    Spouse name: Ashok Croon  . Number of children: 3  . Years of education: Not on file  . Highest education level: Not on file  Occupational History  . Not on file  Tobacco Use  . Smoking status: Never Smoker  . Smokeless tobacco: Never Used  Vaping Use  . Vaping Use: Never used  Substance and Sexual Activity  . Alcohol use: No    Alcohol/week: 0.0  standard drinks  . Drug use: No  . Sexual activity: Not on file  Other Topics Concern  . Not on file  Social History Narrative   Lives with husband   Right Handed   Drinks 1-2 cups caffeine daily   Social Determinants of Health   Financial Resource Strain: Not on file  Food Insecurity: Not on file  Transportation Needs: Not on file  Physical Activity: Not on file  Stress: Not on file  Social Connections: Not on file  Intimate Partner Violence: Not on file    FAMILY HISTORY: Family History  Problem Relation Age of Onset  . Heart attack Sister   . Transient ischemic attack Sister   . Heart attack Mother   . Diabetes Mother   . Transient ischemic attack Mother   . Heart attack Father   . Diabetes Son   . Diabetes Maternal Aunt   .  Rectal cancer Other        anal CA  . Colon cancer Neg Hx   . Celiac disease Neg Hx   . Inflammatory bowel disease Neg Hx   . Stomach cancer Neg Hx     ALLERGIES:  has No Known Allergies.  MEDICATIONS:  Current Outpatient Medications  Medication Sig Dispense Refill  . ACCU-CHEK GUIDE test strip USE AS DIRECTED THREE TIMES DAILY 150 strip 3  . aspirin EC 81 MG tablet Take 81 mg by mouth daily.    Marland Kitchen atorvastatin (LIPITOR) 40 MG tablet Take 20 mg by mouth daily.     . Calcium Citrate-Vitamin D 200-250 MG-UNIT TABS Take 1 tablet by mouth daily.    . colestipol (COLESTID) 1 g tablet Take 1 tablet (1 g total) by mouth 2 (two) times daily. 180 tablet 3  . COMFORT EZ PEN NEEDLES 31G X 5 MM MISC     . Cyanocobalamin (B-12) 1000 MCG TABS Take 1,000 mcg by mouth daily.     . diclofenac sodium (VOLTAREN) 1 % GEL Apply 2 g topically at bedtime.    . DULoxetine (CYMBALTA) 60 MG capsule Take 60 mg by mouth daily.    . enalapril (VASOTEC) 20 MG tablet Take 20 mg by mouth daily.    . famotidine (PEPCID) 20 MG tablet Take 40 mg by mouth daily.    Marland Kitchen labetalol (NORMODYNE) 200 MG tablet Take 200 mg by mouth 2 (two) times daily.    . Lactobacillus Rhamnosus,  GG, (RA PROBIOTIC DIGESTIVE CARE PO) Take 1 capsule by mouth daily.    . magnesium oxide (MAG-OX) 400 MG tablet Take 400 mg by mouth daily.    . Multiple Vitamins-Minerals (CENTRUM) tablet Take 1 tablet by mouth daily.    Willeen Niece 100-33 UNT-MCG/ML SOPN INJECT 22 UNITS SQ DAILY BEFORE BREAKFast 15 mL 1   No current facility-administered medications for this visit.     PHYSICAL EXAMINATION:  ECOG PERFORMANCE STATUS: 0 - Asymptomatic  Vitals:   02/11/21 1046 02/11/21 1100  BP: (!) 205/88 (!) 198/76  Pulse: 76 70  Resp: 18   Temp: 97.7 F (36.5 C)   SpO2: 100%    There were no vitals filed for this visit.  GENERAL:alert, no distress and comfortable, appears pale, no acute distress SKIN: Some pallor noted.   EYES: normal, conjunctiva are pink and non-injected, sclera clear OROPHARYNX:no exudate, no erythema and lips, buccal mucosa, and tongue normal  NECK: supple, thyroid normal size, non-tender, without nodularity LYMPH:  no palpable lymphadenopathy in the cervical, axillary  LUNGS: clear to auscultation and percussion with normal breathing effort HEART: regular rate & rhythm and no murmurs and no lower extremity edema ABDOMEN:abdomen soft, non-tender and normal bowel sounds.  No palpable hepatosplenomegaly. Musculoskeletal:no cyanosis of digits and no clubbing, no bilateral lower extremity edema.  Right lower extremity appears slightly larger than left.  Patient states this is how it has been. PSYCH: alert & oriented x 3 with fluent speech NEURO: no focal motor/sensory deficits  LABORATORY DATA:  I have reviewed the data as listed Lab Results  Component Value Date   WBC 5.9 02/11/2021   HGB 8.8 (L) 02/11/2021   HCT 27.5 (L) 02/11/2021   MCV 89.6 02/11/2021   PLT 203 02/11/2021     Chemistry      Component Value Date/Time   NA 141 12/06/2020 1823   K 4.2 12/06/2020 1823   CL 105 12/06/2020 1823   CO2 23 12/06/2020 1802  BUN 28 (H) 12/06/2020 1823   CREATININE  0.90 12/06/2020 1823   CREATININE 1.07 (H) 12/02/2017 1211      Component Value Date/Time   CALCIUM 9.8 12/06/2020 1802   ALKPHOS 91 12/06/2020 1802   AST 20 12/06/2020 1802   ALT 21 12/06/2020 1802   BILITOT 0.6 12/06/2020 1802       RADIOGRAPHIC STUDIES: I have personally reviewed the radiological images as listed and agreed with the findings in the report. EEG adult  Result Date: 01/29/2021       [facility] [place]. [place] [address] [phone]      Electroencephalogram Procedure Note Ms. ROSHANNA CIMINO Date of Birth:  June 04, 1936 Medical Record Number:  [id number] Indications: Diagnostic Date of Procedure : 01/28/2021 Medications: none Clinical history : 41 patient being evaluated for confusion Technical Description This study was performed using 17 channel digital electroencephalographic recording equipment. International 10-20 electrode placement was used. The record was obtained with the patient awake, drowsy and asleep.  The record is of good technical quality for purposes of interpretation. Activation Procedures:  hyperventilation and photic stimulation . EEG Description Awake: Alpha Activity: The waking state record contains a well-defined bi-occipital alpha rhythm of  moderate amplitude with a dominant frequency of 8-9 Hz. Reactivity is present. No paroxsymal activity, spikes, or sharp waves are noted. Technical component of study is adequate. EKG tracing shows regular sinus rhythm Length of this recording is 22 minutes 44 seconds Sleep: With drowsiness, there is attenuation of the background alpha activity. As the patient enters into light sleep, vertex waves and symmetrical spindles are noted. K complexes are noted in sleep. Transition to the waking state is unremarkable. Result of Activation Procedures: Hyperventilation: Hyperventilation for three minutes fails to activate the recording. Photo Stimulation: Photic stimulation failed to activated the recording.  Summary Normal electroencephalogram, awake, asleep and with activation procedures. There are no focal lateralizing or epileptiform features. And and my my celebration is that me and my family survive the trip to [place] and came back without COVID and had a good time   All questions were answered. The patient knows to call the clinic with any problems, questions or concerns. I spent 45 minutes in the care of this patient including H and P, review of records, counseling and coordination of care. We have reviewed labs, endoscopy, colonoscopy report, discussed about common causes of anemia including blood loss, nutritional deficiency, myelodysplastic syndromes.  Patient is very reluctant to proceed with another colonoscopy at this time.     Benay Pike, MD 02/11/2021 1:00 PM

## 2021-02-11 NOTE — Assessment & Plan Note (Signed)
This is a very pleasant 85 year old female patient with past medical history significant for type 2 diabetes, hypertension, dyslipidemia recently admitted with confusion and urinary tract infection now on antibiotics referred to hematology for further evaluation of worsening anemia.  [name] [name] denies any new complaints except some loss of appetite in the past 2 or 3 weeks while she was taking her antibiotics.  Besides loss of appetite, she feels well for her age.  She denies any change in her bowel habits or urinary habits.  She has most recently seen her gastroenterologist, had colonoscopy last in 2019 without any obvious findings, had endoscopy recently in November with no obvious findings. She has most recently started her iron and B12 supplementation back.  She is very reluctant to do another colonoscopy at this time.  We have discussed about common causes of anemia including but not limited to nutritional deficiencies, blood loss anemia, bone marrow disorder such as myelodysplasia, occasionally multiple myeloma.  I recommended further lab investigation today, continue oral iron and B12 supplementation and return to clinic in about 8 weeks for follow-up. I will give her daughter a call and recommend to consider colonoscopy since her hemoglobin is slowly dropping.

## 2021-02-11 NOTE — Telephone Encounter (Signed)
Patient unable to come to the phone, however spoke with patient's husband regarding patient's recent lab results and recommendations. Patient is advised to follow-up with her gastroenterologist for a colonoscopy d/t decreasing hemoglobin counts. Patient also advised to continue taking her iron and B12 supplements as prescribed.  Husband verbalized and confirmed these recommendations. All questions addressed during call.

## 2021-02-11 NOTE — Telephone Encounter (Signed)
-----   Message from Benay Pike, MD sent at 02/11/2021  2:27 PM EDT ----- Please call the patient and ask her to make a FU with her gastroenterology for colonoscopy since hemoglobin continues to drop. She can take iron and B12 until she comes back to see me.  Thanks,

## 2021-02-12 ENCOUNTER — Encounter: Payer: Self-pay | Admitting: *Deleted

## 2021-02-12 LAB — FOLATE RBC
Folate, Hemolysate: 433 ng/mL
Folate, RBC: 1563 ng/mL (ref >498)
Hematocrit: 27.7 % — ABNORMAL LOW (ref 34.0–46.6)

## 2021-02-12 NOTE — Progress Notes (Signed)
[facility] Psychosocial Distress Screening Clinical Social Work  Clinical Social Work was referred by distress screening protocol.  The patient scored a 5 on the Psychosocial Distress Thermometer which indicates moderate distress. Clinical Social Worker contacted patient by phone to offer support and assess for distress and other psychosocial needs.  Patient stated she was doing "ok", and did not express any needs/concerns at this time.  CSW provided education on CSW role and support team at [facility].  CSW and patient discussed the importance of support and support services at [facility].  Patient was appriciative of CSW contact.  CSW provided contact information and encouraged patient to call with questions or concerns.      ONCBCN DISTRESS SCREENING 02/11/2021  Screening Type Initial Screening  Distress experienced in past week (1-10) 5  Emotional problem type Nervousness/Anxiety  Spiritual/Religous concerns type Facing my mortality  Physical Problem type Tingling hands/feet  Physician notified of physical symptoms Yes  Referral to clinical social work Yes      Johnnye Lana, MSW, LCSW, OSW-C Clinical Social Worker [place] [phone]

## 2021-02-13 LAB — PROTEIN ELECTROPHORESIS, SERUM, WITH REFLEX
A/G Ratio: 1.7 (ref 0.7–1.7)
Albumin ELP: 3.8 g/dL (ref 2.9–4.4)
Alpha-1-Globulin: 0.2 g/dL (ref 0.0–0.4)
Alpha-2-Globulin: 0.7 g/dL (ref 0.4–1.0)
Beta Globulin: 0.8 g/dL (ref 0.7–1.3)
Gamma Globulin: 0.6 g/dL (ref 0.4–1.8)
Globulin, Total: 2.3 g/dL (ref 2.2–3.9)
Total Protein ELP: 6.1 g/dL (ref 6.0–8.5)

## 2021-02-14 ENCOUNTER — Encounter: Payer: Self-pay | Admitting: Neurology

## 2021-02-14 ENCOUNTER — Ambulatory Visit: Payer: Medicare PPO | Admitting: Neurology

## 2021-02-14 VITALS — BP 170/70 | HR 80 | Ht 62.0 in | Wt 142.4 lb

## 2021-02-14 DIAGNOSIS — G459 Transient cerebral ischemic attack, unspecified: Secondary | ICD-10-CM | POA: Diagnosis not present

## 2021-02-14 DIAGNOSIS — R413 Other amnesia: Secondary | ICD-10-CM

## 2021-02-14 DIAGNOSIS — G3184 Mild cognitive impairment, so stated: Secondary | ICD-10-CM

## 2021-02-14 DIAGNOSIS — E7801 Familial hypercholesterolemia: Secondary | ICD-10-CM | POA: Diagnosis not present

## 2021-02-14 DIAGNOSIS — I10 Essential (primary) hypertension: Secondary | ICD-10-CM | POA: Diagnosis not present

## 2021-02-14 DIAGNOSIS — Z6825 Body mass index (BMI) 25.0-25.9, adult: Secondary | ICD-10-CM | POA: Diagnosis not present

## 2021-02-14 DIAGNOSIS — D649 Anemia, unspecified: Secondary | ICD-10-CM | POA: Diagnosis not present

## 2021-02-14 DIAGNOSIS — E1165 Type 2 diabetes mellitus with hyperglycemia: Secondary | ICD-10-CM | POA: Diagnosis not present

## 2021-02-14 NOTE — Progress Notes (Signed)
[facility] [address] [place]. [place] [address] [phone]       OFFICE FOLLOW UP VISIT NOTE  [name] [name] Date of Birth:  12/07/1935 Medical Record Number:  [id number]   Referring MD: Vernell Leep  Reason for Referral: Confusion HPI: Initial visit 12/17/2020 [name] [name] is a pleasant 85 year old Caucasian lady seen today for initial office consultation visit for confusion and memory loss.  History is obtained from the patient and her husband and daughter were accompanying her as well as review of electronic medical records.  I personally reviewed pertinent available imaging films in PACS.  She has past medical history of diabetes, hypertension, hyperlipidemia, anemia who presented on 12/06/2020 to [facility] long emergency room with sudden onset of confusion described had trouble thinking and using the correct words for articulation.  She also had a blank look at speaking to her husband did not seem to understand them or had trouble finding words.  This lasted about 20 minutes and gradually started improving.  She was found to have urine tract infection for which she is started on antibiotics.  She complained of subsequent headache later as well.  Patient also had significantly elevated blood pressure of 176 systolic on admission.  She gradually improved.  Patient apparently and had a somewhat similar episode when she had a previous bout of urinary tract infection as well.  On inquiry the patient's husband states that she has noticed that she is having mild short-term memory difficulties for the last few years.  She is requiring more help with activities of daily living like dressing herself.  She remains a fall risk because of shoulder fracture as well as the left peripheral neuropathy.  She has recently started home physical outpatient speech therapy following recent hospital discharge.  She does have a strong family history of dementia in her sister as well as her mother.  She  denies any hallucinations, delusions, unsafe behavior or agitation.  She did have MRI scan of the brain done on 12/06/2020 which showed mild generalized atrophy and changes of small vessel disease no acute abnormality.  CT angiogram of the brain and neck, showed moderate left paraclinoid carotid stenosis as well as multifocal stenosis of the nondominant right vertebral artery.  Echocardiogram showed normal ejection fraction of 60 to 65%.  Carotid ultrasound was unremarkable.  LDL cholesterol 39 mg percent and hemoglobin A1c of 7.4.  She did not have an EEG done.  Lab work for reversible causes of memory loss was also not done. Update 02/14/2021 : She returns for follow-up after last visit 2 months ago.  She is accompanied by her son and her husband provide history.  Patient continues to have short-term memory difficulties particularly remembering recent information.  Her past memory seems fine.  She is quite independent at home.  She is supposed to use a cane but can walk independently without it indoors and uses a walker for outdoors.  She has had no falls or injuries.  She does get frustrated when she cannot remember things but there is no agitation, she has no delusions hallucinations.  She needs some help with the shower but otherwise can do most of her basic needs by herself.  She had lab work done at last visit which showed elevated TSH of 6.86 but vitamin B12, homocystine and RPR were normal.  She was asked to see primary care physician who has done some additional thyroid labs and she has an appointment in fact today it to  discuss treatment with him.  She had EEG done on 01/29/2021 which was normal.  Patient has been feels that her memory difficulties are unchanged.  She has no new complaints. ROS:   14 system review of systems is positive for confusion, memory loss, word finding difficulty and all other systems negative  PMH:  Past Medical History:  Diagnosis Date  . Anemia   . Anxiety   . Arthritis    . Chronic back pain   . Chronic diarrhea   . Diabetes ([place])   . Diverticulosis   . GERD (gastroesophageal reflux disease)   . Hyperlipidemia   . Hypertension   . Internal hemorrhoids   . UTI (urinary tract infection)     Social History:  Social History   Socioeconomic History  . Marital status: Married    Spouse name: [name] [name]  . Number of children: 3  . Years of education: Not on file  . Highest education level: Not on file  Occupational History  . Not on file  Tobacco Use  . Smoking status: Never Smoker  . Smokeless tobacco: Never Used  Vaping Use  . Vaping Use: Never used  Substance and Sexual Activity  . Alcohol use: No    Alcohol/week: 0.0 standard drinks  . Drug use: No  . Sexual activity: Not on file  Other Topics Concern  . Not on file  Social History Narrative   Lives with husband   Right Handed   Drinks 1-2 cups caffeine daily   Social Determinants of Health   Financial Resource Strain: Not on file  Food Insecurity: Not on file  Transportation Needs: Not on file  Physical Activity: Not on file  Stress: Not on file  Social Connections: Not on file  Intimate Partner Violence: Not on file    Medications:   Current Outpatient Medications on File Prior to Visit  Medication Sig Dispense Refill  . ACCU-CHEK GUIDE test strip USE AS DIRECTED THREE TIMES DAILY 150 strip 3  . aspirin EC 81 MG tablet Take 81 mg by mouth daily.    Marland Kitchen atorvastatin (LIPITOR) 40 MG tablet Take 20 mg by mouth daily.     . Calcium Citrate-Vitamin D 200-250 MG-UNIT TABS Take 1 tablet by mouth daily.    . colestipol (COLESTID) 1 g tablet Take 1 tablet (1 g total) by mouth 2 (two) times daily. 180 tablet 3  . COMFORT EZ PEN NEEDLES 31G X 5 MM MISC     . Cyanocobalamin (B-12) 1000 MCG TABS Take 1,000 mcg by mouth daily.     . diclofenac sodium (VOLTAREN) 1 % GEL Apply 2 g topically at bedtime.    . DULoxetine (CYMBALTA) 60 MG capsule Take 60 mg by mouth daily.    . enalapril (VASOTEC)  20 MG tablet Take 20 mg by mouth daily.    . famotidine (PEPCID) 20 MG tablet Take 40 mg by mouth daily.    . ferrous sulfate 325 (65 FE) MG EC tablet Take 325 mg by mouth daily with breakfast.    . labetalol (NORMODYNE) 200 MG tablet Take 200 mg by mouth 2 (two) times daily.    . Lactobacillus Rhamnosus, GG, (RA PROBIOTIC DIGESTIVE CARE PO) Take 1 capsule by mouth daily.    . magnesium oxide (MAG-OX) 400 MG tablet Take 400 mg by mouth daily.    . Multiple Vitamins-Minerals (CENTRUM) tablet Take 1 tablet by mouth daily.    . SOLIQUA 100-33 UNT-MCG/ML SOPN INJECT 22 UNITS SQ DAILY BEFORE  BREAKFast 15 mL 1   No current facility-administered medications on file prior to visit.    Allergies:  No Known Allergies  Physical Exam General: well developed, well nourished pleasant elderly Caucasian lady, seated, in no evident distress Head: head normocephalic and atraumatic.   Neck: supple with no carotid or supraclavicular bruits Cardiovascular: regular rate and rhythm, no murmurs Musculoskeletal: no deformity Skin:  no rash/petichiae Vascular:  Normal pulses all extremities  Neurologic Exam Mental Status: Awake and fully alert. Oriented to place and time. Recent and remote memory intact. Attention span, concentration and fund of knowledge appropriate. Mood and affect appropriate.  Mini-Mental status exam not done this visit (last visit score 28/30 ) diminished recall 0/3.  Clock drawing 3/4.  Difficulty with copying intersecting pentagons.  Able to name only 5 animals which can walk on 4 legs Cranial Nerves: Fundoscopic exam not done pupils equal, briskly reactive to light. Extraocular movements full without nystagmus. Visual fields full to confrontation. Hearing intact. Facial sensation intact. Face, tongue, palate moves normally and symmetrically.  Motor: Normal bulk and tone. Normal strength in all tested extremity muscles except mild ankle dorsiflexor weakness bilaterally.. Sensory.: i  diminished touch , pinprick , position and vibratory sensation from ankle down bilaterally.  Positive Romberg's test. Coordination: Rapid alternating movements normal in all extremities. Finger-to-nose and heel-to-shin performed accurately bilaterally. Gait and Station: Arises from chair without difficulty. Stance is normal. Gait demonstrates slight broad-based and mild ataxia and uses a cane.  Unsteady while standing on either foot unsupported.. Able to heel, toe and tandem walk with moderate difficulty.  Reflexes: 1+ and symmetric except ankle jerks are depressed. Toes downgoing.     MMSE - Mini Mental State Exam 12/17/2020  Orientation to time 5  Orientation to Place 5  Registration 3  Attention/ Calculation 5  Recall 2  Language- name 2 objects 2  Language- repeat 0  Language- follow 3 step command 3  Language- read & follow direction 1  Write a sentence 1  Copy design 1  Total score 28     ASSESSMENT: 85 year old lady with episode of transient confusion in the setting of urinary tract infection likely decompensation in a patient with mild cognitive impairment versus mild  dementia.  Strong family history of Alzheimer's     PLAN: I had a long discussion with the patient, husband and son regarding results of lab work for reversible causes of memory loss and EEG.  She has elevated TSH and is likely hypothyroid and this needs to be replaced.  She has appointment with primary care physician today same.  I recommend she increase participation in cognitively challenging activities like solving crossword puzzles, playing bridge and sodoku.  We also discussed memory compensation strategies.  We discussed the risk of mild cognitive impairment progressing to dementia.  I recommend we hold off on medications like Aricept or Namenda at the present time unless there is clearly documented decline.  She will return for follow-up in the future in 3 months or call earlier if necessary. Greater than 50%  time during this 25-minute  visit was spent on counseling and coordination of care about her episodes of confusion and baseline memory loss and dementia and answering questions Antony Contras, MD  [facility] [address] [place], [place] [address]  Phone [phone] Fax [phone] Note: This document was prepared with digital dictation and possible smart phrase technology. Any transcriptional errors that result from this process are unintentional.

## 2021-02-14 NOTE — Patient Instructions (Signed)
I had a long discussion with the patient, husband and son regarding results of lab work for reversible causes of memory loss and EEG.  She has elevated TSH and is likely hypothyroid and this needs to be replaced.  She has appointment with primary care physician today same.  I recommend she increase participation in cognitively challenging activities like solving crossword puzzles, playing bridge and sodoku.  We also discussed memory compensation strategies.  We discussed the risk of mild cognitive impairment progressing to dementia.  I recommend we hold off on medications like Aricept or Namenda at the present time unless there is clearly documented decline.  She will return for follow-up in the future in 3 months or call earlier if necessary. Memory Compensation Strategies  1. Use "WARM" strategy.  W= write it down  A= associate it  R= repeat it  M= make a mental note  2.   You can keep a Social worker.  Use a 3-ring notebook with sections for the following: calendar, important names and phone numbers,  medications, doctors' names/phone numbers, lists/reminders, and a section to journal what you did  each day.   3.    Use a calendar to write appointments down.  4.    Write yourself a schedule for the day.  This can be placed on the calendar or in a separate section of the Memory Notebook.  Keeping a  regular schedule can help memory.  5.    Use medication organizer with sections for each day or morning/evening pills.  You may need help loading it  6.    Keep a basket, or pegboard by the door.  Place items that you need to take out with you in the basket or on the pegboard.  You may also want to  include a message board for reminders.  7.    Use sticky notes.  Place sticky notes with reminders in a place where the task is performed.  For example: " turn off the  stove" placed by the stove, "lock the door" placed on the door at eye level, " take your medications" on  the bathroom mirror or  by the place where you normally take your medications.  8.    Use alarms/timers.  Use while cooking to remind yourself to check on food or as a reminder to take your medicine, or as a  reminder to make a call, or as a reminder to perform another task, etc.

## 2021-02-26 ENCOUNTER — Ambulatory Visit: Payer: Medicare PPO | Admitting: Neurology

## 2021-03-07 ENCOUNTER — Other Ambulatory Visit: Payer: Self-pay

## 2021-03-07 ENCOUNTER — Encounter: Payer: Self-pay | Admitting: Endocrinology

## 2021-03-07 ENCOUNTER — Ambulatory Visit: Payer: Medicare PPO | Admitting: Endocrinology

## 2021-03-07 VITALS — BP 158/78 | HR 73 | Ht 62.0 in | Wt 138.2 lb

## 2021-03-07 DIAGNOSIS — E1165 Type 2 diabetes mellitus with hyperglycemia: Secondary | ICD-10-CM

## 2021-03-07 DIAGNOSIS — R7989 Other specified abnormal findings of blood chemistry: Secondary | ICD-10-CM | POA: Diagnosis not present

## 2021-03-07 DIAGNOSIS — Z794 Long term (current) use of insulin: Secondary | ICD-10-CM | POA: Diagnosis not present

## 2021-03-07 DIAGNOSIS — R630 Anorexia: Secondary | ICD-10-CM | POA: Diagnosis not present

## 2021-03-07 LAB — BASIC METABOLIC PANEL
BUN: 24 mg/dL — ABNORMAL HIGH (ref 6–23)
CO2: 28 mEq/L (ref 19–32)
Calcium: 10.1 mg/dL (ref 8.4–10.5)
Chloride: 104 mEq/L (ref 96–112)
Creatinine, Ser: 1.19 mg/dL (ref 0.40–1.20)
GFR: 42.41 mL/min — ABNORMAL LOW (ref >60.00)
Glucose, Bld: 100 mg/dL — ABNORMAL HIGH (ref 70–99)
Potassium: 4 mEq/L (ref 3.5–5.1)
Sodium: 141 mEq/L (ref 135–145)

## 2021-03-07 LAB — T4, FREE: Free T4: 1.06 ng/dL (ref 0.60–1.60)

## 2021-03-07 LAB — POCT GLYCOSYLATED HEMOGLOBIN (HGB A1C): Hemoglobin A1C: 6.4 % — AB (ref 4.0–5.6)

## 2021-03-07 LAB — TSH: TSH: 1.17 u[IU]/mL (ref 0.35–4.50)

## 2021-03-07 NOTE — Patient Instructions (Addendum)
Check blood sugars on waking up 3-4 days a week  Also check blood sugars about 2 hours after meals and do this after different meals by rotation  Recommended blood sugar levels on waking up are 90-130 and about 2 hours after meal is 130-180  Please bring your blood sugar monitor to each visit, thank you

## 2021-03-07 NOTE — Progress Notes (Signed)
Patient ID: [name] [name], female   DOB: [birth year], 85 y.o.   MRN: [id number]           Reason for Appointment: Follow-up for Type 2 Diabetes   History of Present Illness:          Date of diagnosis of type 2 diabetes mellitus: At age 23   approximately      Background history:  She is a poor historian and not clear when she was started on insulin, probably at least since 2018 She thinks she has been on metformin in the past but this was causing diarrhea She does not know what other medications she has taken No prior A1c records are available except from 11/18  Recent history:   A1c is 6.4 compared with 7.4 previously   INSULIN regimen is: Soliqua 18 units in am      Non-insulin hypoglycemic drugs the patient is taking are: None  Current management, blood sugar patterns and problems identified:   Because of her dementia she has difficulty giving a good history  She did bring her monitor for download today  As before she is somewhat unclear about her Soliqua doses but her family thinks she is taking 18 units consistently She does not think she has any episodes of shakiness or feeling bad during the night when she was having low sugars She was supposed to try taking a bedtime snack consistently but she does not occasionally only She thinks she still has low sugars overnight every 2 to 3 months including within the last month As before she is only checking blood sugars in the mornings and not consistently Generally not very active Not clear why she is having decreased appetite but she thinks this has been for the last 2 or 3 months Has lost weight.   Her blood sugars at home appear to be mildly increased overall but not consistently and highest reading of 176 may be after breakfast         Side effects from medications have been: Diarrhea from metformin  :    Typical meal intake: Breakfast is   toast, eggs and meat, lunch usually half sandwich with fruit and sweet tea,  dinner meat, potato, salad and tea.  Mostly snacks are fruits              Exercise:   she is trying to get on her exercise bike, little walking limited by balance  Glucose monitoring:  Done 1 times a day         Glucometer:  Accu-Chek     Blood Glucose readings by monitor download   Her recent range = 128-176 with AVERAGE 156   Dietician visit, most recent: Few years ago  Weight history:  Wt Readings from Last 3 Encounters:  03/07/21 138 lb 3.2 oz (62.7 kg)  02/14/21 142 lb 6.4 oz (64.6 kg)  02/06/21 143 lb (64.9 kg)    Glycemic control: A1c in 11/19 was 8.3    Lab Results  Component Value Date   HGBA1C 6.4 (A) 03/07/2021   HGBA1C 7.4 (H) 12/07/2020   HGBA1C 7.2 (A) 12/05/2020   Lab Results  Component Value Date   MICROALBUR 28.2 (H) 09/06/2020   LDLCALC 39 12/07/2020   CREATININE 0.90 12/06/2020   Lab Results  Component Value Date   MICRALBCREAT 55.6 (H) 09/06/2020    No results found for: FRUCTOSAMINE  Office Visit on 03/07/2021  Component Date Value Ref Range Status   Hemoglobin A1C 03/07/2021  6.4 (A) 4.0 - 5.6 % Final    Allergies as of 03/07/2021   No Known Allergies      Medication List        Accurate as of March 07, 2021 11:20 AM. If you have any questions, ask your nurse or doctor.          STOP taking these medications    aspirin EC 81 MG tablet Stopped by: Elayne Snare, MD       TAKE these medications    Accu-Chek Guide test strip Generic drug: glucose blood USE AS DIRECTED THREE TIMES DAILY   atorvastatin 40 MG tablet Commonly known as: LIPITOR Take 20 mg by mouth daily.   B-12 1000 MCG Tabs Take 1,000 mcg by mouth daily.   Calcium Citrate-Vitamin D 200-250 MG-UNIT Tabs Take 1 tablet by mouth daily.   Centrum tablet Take 1 tablet by mouth daily.   colestipol 1 g tablet Commonly known as: COLESTID Take 1 tablet (1 g total) by mouth 2 (two) times daily.   Comfort EZ Pen Needles 31G X 5 MM Misc Generic drug: Insulin  Pen Needle   diclofenac sodium 1 % Gel Commonly known as: VOLTAREN Apply 2 g topically at bedtime.   DULoxetine 60 MG capsule Commonly known as: CYMBALTA Take 60 mg by mouth daily.   enalapril 20 MG tablet Commonly known as: VASOTEC Take 20 mg by mouth daily.   famotidine 20 MG tablet Commonly known as: PEPCID Take 40 mg by mouth daily.   ferrous sulfate 325 (65 FE) MG EC tablet Take 325 mg by mouth daily with breakfast.   labetalol 200 MG tablet Commonly known as: NORMODYNE Take 200 mg by mouth 2 (two) times daily.   magnesium oxide 400 MG tablet Commonly known as: MAG-OX Take 400 mg by mouth daily.   RA PROBIOTIC DIGESTIVE CARE PO Take 1 capsule by mouth daily.   Soliqua 100-33 UNT-MCG/ML Sopn Generic drug: Insulin Glargine-Lixisenatide INJECT 22 UNITS SQ DAILY BEFORE BREAKFast        Allergies: No Known Allergies  Past Medical History:  Diagnosis Date   Anemia    Anxiety    Arthritis    Chronic back pain    Chronic diarrhea    Diabetes ([place])    Diverticulosis    GERD (gastroesophageal reflux disease)    Hyperlipidemia    Hypertension    Internal hemorrhoids    UTI (urinary tract infection)     Past Surgical History:  Procedure Laterality Date   ABDOMINAL HYSTERECTOMY     CARDIAC CATHETERIZATION     CATARACT EXTRACTION, BILATERAL     CHOLECYSTECTOMY     COLONOSCOPY  11/2015   Dr. Britta Mccreedy: hyperplastic polyp from sigmoid colon. biopsies from ascending colon were negative for microscopic colitis.    COLONOSCOPY N/A 03/31/2018   Dr. Gala Romney: Diverticulosis, random colon biopsies negative, hemorrhoids.  No future screening or surveillance colonoscopy is recommended.   TONSILLECTOMY     TRIGGER FINGER RELEASE      Family History  Problem Relation Age of Onset   Heart attack Sister    Transient ischemic attack Sister    Heart attack Mother    Diabetes Mother    Transient ischemic attack Mother    Heart attack Father    Diabetes Son    Diabetes  Maternal Aunt    Rectal cancer Other        anal CA   Colon cancer Neg Hx    Celiac disease Neg  Hx    Inflammatory bowel disease Neg Hx    Stomach cancer Neg Hx     Social History:  reports that she has never smoked. She has never used smokeless tobacco. She reports that she does not drink alcohol and does not use drugs.   Review of Systems   Lipid history: Treated by PCP with 40 mg Lipitor long-term She has no side effects with this    Lab Results  Component Value Date   CHOL 114 12/07/2020   HDL 49 12/07/2020   LDLCALC 39 12/07/2020   TRIG 129 12/07/2020   CHOLHDL 2.3 12/07/2020           Hypertension: Has been present for several years treated by PCP, is on enalapril 20 mg, Normodyne 200 mg and hydrochlorothiazide Blood pressure may be high on first measurement on her office visits  BP Readings from Last 3 Encounters:  03/07/21 (!) 158/78  02/14/21 (!) 170/70  02/11/21 (!) 198/76   She has had somewhat variable readings functions  Lab Results  Component Value Date   CREATININE 0.90 12/06/2020   CREATININE 0.87 12/06/2020   CREATININE 1.13 12/05/2020     Most recent eye exam was in 07/2020 but report not available, due for follow-up  Most recent foot exam: 2/20  Currently known complications of diabetes: Diabetic peripheral neuropathy  Has symptoms of numbness in feet and fingers  She previously had a TSH of 5.4 done in 01/2019, subsequently normal However in 3/22 repeat TSH within 2 weeks after normal level was 6.9 and she was started on likely 25 mcg of levothyroxine at the suggestion of the neurologist no record of this is available  The family does not think she feels any better She still complaining of feeling sleepy and has decreased appetite   Lab Results  Component Value Date   TSH 6.860 (H) 12/17/2020   TSH 2.16 12/05/2020   TSH 2.51 11/04/2019   FREET4 0.71 11/04/2019    Physical Examination:  BP (!) 158/78   Pulse 73   Ht 5\' 2"   (1.575 m)   Wt 138 lb 3.2 oz (62.7 kg)   SpO2 96%   BMI 25.28 kg/m      ASSESSMENT:  Diabetes type 2, insulin requiring, with neuropathy  See history of present illness for detailed discussion of current diabetes management, blood sugar patterns and problems identified  Her A1c is better than expected at 6.4  She is doing well on only 18 units of Soliqua as the only diabetes treatment, had been intolerant to Metformin Although she is having some recent decreased appetite and weight loss this is unlikely to be from low-dose Soliqua as she has been on this for 2 years now  Blood sugar monitoring is inadequate and doses only sporadic readings in the mornings which are averaging about 156 No history of hypoglycemia but her history is not reliable   Previous history of high TSH: Likely has subclinical hypothyroidism especially considering her age She has been started on levothyroxine, unknown dosage without any clinical benefit so far   HYPERTENSION: Blood pressure is persistently high although not clear if some of this is whitecoat syndrome To continue follow-up with PCP  Weight loss and decreased appetite: She will discuss using an antidepressant which apparently had been used long-term before and she was doing well with this  Severe anemia: Continues to be evaluated  PLAN:    Glucose monitoring: She will have her family members remind her to check readings  after lunch or dinner periodically To call if her morning sugars are unusually high or low She will have a bedtime snack daily She will discuss nutritional supplements with PCP if she is still having decreased appetite To take the [place] consistently at the same time daily Recheck thyroid levels today and if TSH is low normal may consider stopping levothyroxine, will need to check what dose she is taking also    Follow-up in about 3 months  Total visit time including counseling =30 minutes    Patient Instructions   Check blood sugars on waking up 3-4 days a week  Also check blood sugars about 2 hours after meals and do this after different meals by rotation  Recommended blood sugar levels on waking up are 90-130 and about 2 hours after meal is 130-180  Please bring your blood sugar monitor to each visit, thank you      Elayne Snare 03/07/2021, 11:20 AM   Note: This office note was prepared with Dragon voice recognition system technology. Any transcriptional errors that result from this process are unintentional.

## 2021-03-14 DIAGNOSIS — E039 Hypothyroidism, unspecified: Secondary | ICD-10-CM | POA: Diagnosis not present

## 2021-03-14 DIAGNOSIS — N183 Chronic kidney disease, stage 3 unspecified: Secondary | ICD-10-CM | POA: Diagnosis not present

## 2021-03-14 DIAGNOSIS — R5383 Other fatigue: Secondary | ICD-10-CM | POA: Diagnosis not present

## 2021-03-14 DIAGNOSIS — E1165 Type 2 diabetes mellitus with hyperglycemia: Secondary | ICD-10-CM | POA: Diagnosis not present

## 2021-03-19 DIAGNOSIS — I10 Essential (primary) hypertension: Secondary | ICD-10-CM | POA: Diagnosis not present

## 2021-03-19 DIAGNOSIS — D649 Anemia, unspecified: Secondary | ICD-10-CM | POA: Diagnosis not present

## 2021-03-19 DIAGNOSIS — G459 Transient cerebral ischemic attack, unspecified: Secondary | ICD-10-CM | POA: Diagnosis not present

## 2021-03-19 DIAGNOSIS — R413 Other amnesia: Secondary | ICD-10-CM | POA: Diagnosis not present

## 2021-03-19 DIAGNOSIS — E7801 Familial hypercholesterolemia: Secondary | ICD-10-CM | POA: Diagnosis not present

## 2021-03-19 DIAGNOSIS — E7800 Pure hypercholesterolemia, unspecified: Secondary | ICD-10-CM | POA: Diagnosis not present

## 2021-03-19 DIAGNOSIS — Z6824 Body mass index (BMI) 24.0-24.9, adult: Secondary | ICD-10-CM | POA: Diagnosis not present

## 2021-03-19 DIAGNOSIS — E1165 Type 2 diabetes mellitus with hyperglycemia: Secondary | ICD-10-CM | POA: Diagnosis not present

## 2021-04-04 DIAGNOSIS — E7801 Familial hypercholesterolemia: Secondary | ICD-10-CM | POA: Diagnosis not present

## 2021-04-04 DIAGNOSIS — I10 Essential (primary) hypertension: Secondary | ICD-10-CM | POA: Diagnosis not present

## 2021-04-04 DIAGNOSIS — E1165 Type 2 diabetes mellitus with hyperglycemia: Secondary | ICD-10-CM | POA: Diagnosis not present

## 2021-04-04 DIAGNOSIS — Z6824 Body mass index (BMI) 24.0-24.9, adult: Secondary | ICD-10-CM | POA: Diagnosis not present

## 2021-04-04 DIAGNOSIS — R413 Other amnesia: Secondary | ICD-10-CM | POA: Diagnosis not present

## 2021-04-04 DIAGNOSIS — G459 Transient cerebral ischemic attack, unspecified: Secondary | ICD-10-CM | POA: Diagnosis not present

## 2021-04-04 DIAGNOSIS — D649 Anemia, unspecified: Secondary | ICD-10-CM | POA: Diagnosis not present

## 2021-04-04 DIAGNOSIS — E7800 Pure hypercholesterolemia, unspecified: Secondary | ICD-10-CM | POA: Diagnosis not present

## 2021-04-05 ENCOUNTER — Telehealth: Payer: Self-pay | Admitting: Hematology and Oncology

## 2021-04-05 NOTE — Telephone Encounter (Signed)
Cancelled appt per 7/7 sch msg. Called pt, no answer. Left msg for pt to call back to r/s.

## 2021-04-08 ENCOUNTER — Ambulatory Visit: Payer: Medicare PPO | Admitting: Hematology and Oncology

## 2021-05-02 DIAGNOSIS — R300 Dysuria: Secondary | ICD-10-CM | POA: Diagnosis not present

## 2021-05-02 DIAGNOSIS — Z6824 Body mass index (BMI) 24.0-24.9, adult: Secondary | ICD-10-CM | POA: Diagnosis not present

## 2021-05-21 ENCOUNTER — Ambulatory Visit: Payer: Medicare PPO | Admitting: Neurology

## 2021-05-21 ENCOUNTER — Encounter: Payer: Self-pay | Admitting: Neurology

## 2021-05-21 ENCOUNTER — Other Ambulatory Visit: Payer: Self-pay

## 2021-05-21 VITALS — BP 177/78 | HR 82 | Ht 62.5 in | Wt 137.4 lb

## 2021-05-21 DIAGNOSIS — R413 Other amnesia: Secondary | ICD-10-CM | POA: Diagnosis not present

## 2021-05-21 MED ORDER — DONEPEZIL HCL 10 MG PO TABS: 10.0000 mg | ORAL_TABLET | Freq: Every day | ORAL | 3 refills | Status: DC

## 2021-05-21 NOTE — Patient Instructions (Signed)
I had a long discussion with the patient, husband and daughter regarding her memory loss and cognitive impairment which appears to be slightly progressed since I recommend starting Aricept 5 mg daily for a month increase if tolerated without side effects to 10 mg daily.  Continue participation in cognitively challenging activities like solving crossword puzzles, playing bridge and sodoku.  Return for follow-up in future in 3 months or call earlier if necessary. Alzheimer's Disease Caregiver Guide Alzheimer's disease is a condition that makes a person: Forget things. Act differently. Have trouble paying attention and doing simple tasks. These things get worse with time. The tips below can help you care for theperson. How to help manage lifestyle changes Tips to help with symptoms Be calm and patient. Give simple, short answers to questions. Avoid correcting the person in a negative way. Try not to take things personally, even if the person forgets your name. Do not argue with the person. This may make the person more upset. Tips to lessen frustration Make appointments and do daily tasks when the person is at his or her best. Take your time. Simple tasks may take longer. Allow plenty of time to complete tasks. Limit choices for the person. Involve the person in what you are doing. Keep things organized: Keep a daily routine. Organize medicines in a pillbox for each day of the week. Keep a calendar in a central location to remind the person of meetings or other activities. Avoid new or crowded places, if possible. Use simple words, short sentences, and a calm voice. Only give one direction at a time. Buy clothes and shoes that are easy to put on and take off. Try to change the subject if the person becomes frustrated or angry. Tips to prevent injury  Keep floors clear. Remove rugs, magazine racks, and floor lamps. Keep hallways well-lit. Put a handrail and non-slip mat in the bathtub or  shower. Put childproof locks on cabinets that have dangerous items in them. These items include medicine, alcohol, guns, toxic cleaning items, sharp tools, matches, and lighters. Put locks on doors where the person cannot see or reach them. This helps keep the person from going out of the house and getting lost. Be ready for emergencies. Keep a list of emergency phone numbers and addresses close by. Remove car keys and lock garage doors so that the person does not try to drive. Bracelets may be worn that track location and identify the person as having memory problems. This should be worn at all times for safety.  Tips for the future  Discuss financial and legal planning early. People with this disease have trouble managing their money as the disease gets worse. Get help from a professional. Talk about advance directives, safety, and daily care. Take these steps: Create a living will and choose a power of attorney. This is someone who can make decisions for the person with Alzheimer's disease when he or she can no longer do so. Discuss driving safety and when to stop driving. The person's doctor can help with this. If the person lives alone, make sure he or she is safe. Some people need extra help at home. Other people need more care at a nursing home or care center.  How to recognize changes in the person's condition With this disease, memory problems and confusion slowly get worse. In time, theperson may not know his or her friends and family members. The disease can also cause changes in behavior and mood, such as anxiety or  anger. The person may see, hear, taste, smell, or feel things that are not real (hallucinate). These changes can come on all of a sudden. They may happen in response to something such as: Pain. An infection. Changes in temperature or noise. Too much stimulation. Feeling lost or scared. Medicines. Where to find support Find out about services that can provide  short-term care (respite care). These can allow you to take a break when you need it. Join a support group near you. These groups can help you: Learn ways to manage stress. Share experiences with others. Get emotional comfort and support. Learn about caregiving as the disease gets worse. Know what community resources are available. Where to find more information Alzheimer's [employer]: [employer] Contact a doctor if: The person has a fever. The person has a sudden behavior change that does not get better with calming strategies. The person is not able to take care of himself or herself at home. You are no longer able to care for the person. Get help right away if: The person has a sudden increase in confusion or new hallucinations. The person threatens you or anyone else, including himself or herself. Get help right away if you feel like your loved one may hurt himself or herself or others, or has thoughts about taking his or her own life. Go to your nearest emergency room or: Call your local emergency services (911 in the [place] Call the [place] at (540)243-0923. This is open 24 hours a day. Text the Crisis Text Line at (530) 490-6236. Summary Alzheimer's disease causes a person to forget things. A person who has this condition may have trouble doing simple tasks. Take steps to keep the person from getting hurt. Plan for future care. You can find support by joining a support group near you. This information is not intended to replace advice given to you by your health care provider. Make sure you discuss any questions you have with your healthcare provider. Document Revised: 01/02/2020 Document Reviewed: 01/02/2020 Elsevier Patient Education  2022 [employer].

## 2021-05-21 NOTE — Progress Notes (Signed)
[facility] [address] [place]. [place] [address] [phone]       OFFICE FOLLOW UP VISIT NOTE  Ms. [name] [name] Date of Birth:  12/07/1935 Medical Record Number:  [id number]   Referring MD: Vernell Leep  Reason for Referral: Confusion HPI: Initial visit 12/17/2020 [name] [name] is a pleasant 85 year old Caucasian lady seen today for initial office consultation visit for confusion and memory loss.  History is obtained from the patient and her husband and daughter were accompanying her as well as review of electronic medical records.  I personally reviewed pertinent available imaging films in PACS.  She has past medical history of diabetes, hypertension, hyperlipidemia, anemia who presented on 12/06/2020 to [facility] long emergency room with sudden onset of confusion described had trouble thinking and using the correct words for articulation.  She also had a blank look at speaking to her husband did not seem to understand them or had trouble finding words.  This lasted about 20 minutes and gradually started improving.  She was found to have urine tract infection for which she is started on antibiotics.  She complained of subsequent headache later as well.  Patient also had significantly elevated blood pressure of 176 systolic on admission.  She gradually improved.  Patient apparently and had a somewhat similar episode when she had a previous bout of urinary tract infection as well.  On inquiry the patient's husband states that she has noticed that she is having mild short-term memory difficulties for the last few years.  She is requiring more help with activities of daily living like dressing herself.  She remains a fall risk because of shoulder fracture as well as the left peripheral neuropathy.  She has recently started home physical outpatient speech therapy following recent hospital discharge.  She does have a strong family history of dementia in her sister as well as her mother.  She  denies any hallucinations, delusions, unsafe behavior or agitation.  She did have MRI scan of the brain done on 12/06/2020 which showed mild generalized atrophy and changes of small vessel disease no acute abnormality.  CT angiogram of the brain and neck, showed moderate left paraclinoid carotid stenosis as well as multifocal stenosis of the nondominant right vertebral artery.  Echocardiogram showed normal ejection fraction of 60 to 65%.  Carotid ultrasound was unremarkable.  LDL cholesterol 39 mg percent and hemoglobin A1c of 7.4.  She did not have an EEG done.  Lab work for reversible causes of memory loss was also not done. Update 02/14/2021 : She returns for follow-up after last visit 2 months ago.  She is accompanied by her son and her husband provide history.  Patient continues to have short-term memory difficulties particularly remembering recent information.  Her past memory seems fine.  She is quite independent at home.  She is supposed to use a cane but can walk independently without it indoors and uses a walker for outdoors.  She has had no falls or injuries.  She does get frustrated when she cannot remember things but there is no agitation, she has no delusions hallucinations.  She needs some help with the shower but otherwise can do most of her basic needs by herself.  She had lab work done at last visit which showed elevated TSH of 6.86 but vitamin B12, homocystine and RPR were normal.  She was asked to see primary care physician who has done some additional thyroid labs and she has an appointment in fact today it to  discuss treatment with him.  She had EEG done on 01/29/2021 which was normal.  Patient has been feels that her memory difficulties are unchanged.  She has no new complaints. Update 05/21/2021 : She returns for follow-up after last visit 3 months ago.  She is accompanied by her husband and daughter.  She continues to have short-term memory difficulties but these family feels unchanged.  She  has some good days and bad days.  She still is mostly independent in activities of daily living and does not need much supervision.  There have been no delusions, hallucinations or unsafe behaviors noted.  She is pretty calm and does not get agitated and there has been no violent behavior noted.  On Mini-Mental status exam today she scored 26/30 which is actually declined from 28/30 and March 2022. ROS:   14 system review of systems is positive for confusion, memory loss, word finding difficulty and all other systems negative  PMH:  Past Medical History:  Diagnosis Date   Anemia    Anxiety    Arthritis    Chronic back pain    Chronic diarrhea    Diabetes (HCC)    Diverticulosis    GERD (gastroesophageal reflux disease)    Hyperlipidemia    Hypertension    Internal hemorrhoids    UTI (urinary tract infection)     Social History:  Social History   Socioeconomic History   Marital status: Married    Spouse name: Lobbyist   Number of children: 3   Years of education: Not on file   Highest education level: Not on file  Occupational History   Not on file  Tobacco Use   Smoking status: Never   Smokeless tobacco: Never  Vaping Use   Vaping Use: Never used  Substance and Sexual Activity   Alcohol use: No    Alcohol/week: 0.0 standard drinks   Drug use: No   Sexual activity: Not on file  Other Topics Concern   Not on file  Social History Narrative   Lives at home with husband    Right Handed   Drinks 1-2 cups caffeine daily   Social Determinants of Health   Financial Resource Strain: Not on file  Food Insecurity: Not on file  Transportation Needs: Not on file  Physical Activity: Not on file  Stress: Not on file  Social Connections: Not on file  Intimate Partner Violence: Not on file    Medications:   Current Outpatient Medications on File Prior to Visit  Medication Sig Dispense Refill   ACCU-CHEK GUIDE test strip USE AS DIRECTED THREE TIMES DAILY 150 strip 3    atorvastatin (LIPITOR) 40 MG tablet Take 20 mg by mouth daily.      Calcium Citrate-Vitamin D 200-250 MG-UNIT TABS Take 1 tablet by mouth daily.     colestipol (COLESTID) 1 g tablet Take 1 tablet (1 g total) by mouth 2 (two) times daily. 180 tablet 3   COMFORT EZ PEN NEEDLES 31G X 5 MM MISC      Cyanocobalamin (B-12) 1000 MCG TABS Take 1,000 mcg by mouth daily.      diclofenac sodium (VOLTAREN) 1 % GEL Apply 2 g topically at bedtime.     DULoxetine (CYMBALTA) 60 MG capsule Take 60 mg by mouth daily.     enalapril (VASOTEC) 20 MG tablet Take 20 mg by mouth daily.     famotidine (PEPCID) 20 MG tablet Take 40 mg by mouth daily.     ferrous sulfate  325 (65 FE) MG EC tablet Take 325 mg by mouth daily with breakfast.     labetalol (NORMODYNE) 200 MG tablet Take 200 mg by mouth 2 (two) times daily.     Lactobacillus Rhamnosus, GG, (RA PROBIOTIC DIGESTIVE CARE PO) Take 1 capsule by mouth daily.     levothyroxine (SYNTHROID) 25 MCG tablet Take 25 mcg by mouth daily before breakfast.     magnesium oxide (MAG-OX) 400 MG tablet Take 400 mg by mouth daily.     Multiple Vitamins-Minerals (CENTRUM) tablet Take 1 tablet by mouth daily.     sertraline (ZOLOFT) 25 MG tablet Take 25 mg by mouth daily.     SOLIQUA 100-33 UNT-MCG/ML SOPN INJECT 22 UNITS SQ DAILY BEFORE BREAKFast (Patient taking differently: 18 Units.) 15 mL 1   No current facility-administered medications on file prior to visit.    Allergies:  No Known Allergies  Physical Exam General: well developed, well nourished pleasant elderly Caucasian lady, seated, in no evident distress Head: head normocephalic and atraumatic.   Neck: supple with no carotid or supraclavicular bruits Cardiovascular: regular rate and rhythm, no murmurs Musculoskeletal: no deformity Skin:  no rash/petichiae Vascular:  Normal pulses all extremities  Neurologic Exam Mental Status: Awake and fully alert. Oriented to place and time. Recent and remote memory intact.  Attention span, concentration and fund of knowledge appropriate. Mood and affect appropriate.  Mini-Mental status exam not done this visit (last visit score 28/30 ) diminished recall 0/3.  Clock drawing 3/4.  Difficulty with copying intersecting pentagons.  Able to name only 5 animals which can walk on 4 legs Cranial Nerves: Fundoscopic exam not done pupils equal, briskly reactive to light. Extraocular movements full without nystagmus. Visual fields full to confrontation. Hearing intact. Facial sensation intact. Face, tongue, palate moves normally and symmetrically.  Motor: Normal bulk and tone. Normal strength in all tested extremity muscles except mild ankle dorsiflexor weakness bilaterally.. Sensory.: i diminished touch , pinprick , position and vibratory sensation from ankle down bilaterally.  Positive Romberg's test. Coordination: Rapid alternating movements normal in all extremities. Finger-to-nose and heel-to-shin performed accurately bilaterally. Gait and Station: Arises from chair without difficulty. Stance is normal. Gait demonstrates slight broad-based and mild ataxia and uses a cane.  Unsteady while standing on either foot unsupported.. Able to heel, toe and tandem walk with moderate difficulty.  Reflexes: 1+ and symmetric except ankle jerks are depressed. Toes downgoing.     MMSE - Mini Mental State Exam 05/21/2021 12/17/2020  Orientation to time 5 5  Orientation to Place 5 5  Registration 3 3  Attention/ Calculation 5 5  Recall 0 2  Language- name 2 objects 2 2  Language- repeat 1 0  Language- follow 3 step command 3 3  Language- read & follow direction 1 1  Write a sentence 1 1  Copy design 0 1  Copy design-comments 4 legged animals x 1 minute: 3 -  Total score 26 28     ASSESSMENT: 85 year old lady with episode of transient confusion in the setting of urinary tract infection likely decompensation in a patient with mild cognitive impairment versus mild  dementia.  Strong family  history of Alzheimer's.  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  Continue participation in cognitively challenging activities like solving crossword puzzles, playing bridge and sodoku.  Return for follow-up in future in 3 months or call earlier if necessary.Greater than 50% time during this 30 -minute  visit was spent on counseling and coordination of care about her episodes of confusion and baseline memory loss and dementia and answering questions Antony Contras, MD  [facility] [address] [place], [place] [address]  Phone [phone] Fax [phone] Note: This document was prepared with digital dictation and possible smart phrase technology. Any transcriptional errors that result from this process are unintentional.

## 2021-06-07 ENCOUNTER — Ambulatory Visit: Payer: Medicare PPO | Admitting: Endocrinology

## 2021-06-12 ENCOUNTER — Other Ambulatory Visit: Payer: Self-pay

## 2021-06-12 ENCOUNTER — Ambulatory Visit: Payer: Medicare PPO | Admitting: Endocrinology

## 2021-06-12 VITALS — BP 130/70 | HR 64 | Ht 63.0 in | Wt 136.4 lb

## 2021-06-12 DIAGNOSIS — I10 Essential (primary) hypertension: Secondary | ICD-10-CM

## 2021-06-12 DIAGNOSIS — R7989 Other specified abnormal findings of blood chemistry: Secondary | ICD-10-CM | POA: Diagnosis not present

## 2021-06-12 DIAGNOSIS — E1165 Type 2 diabetes mellitus with hyperglycemia: Secondary | ICD-10-CM | POA: Diagnosis not present

## 2021-06-12 DIAGNOSIS — Z794 Long term (current) use of insulin: Secondary | ICD-10-CM | POA: Diagnosis not present

## 2021-06-12 LAB — POCT GLYCOSYLATED HEMOGLOBIN (HGB A1C): Hemoglobin A1C: 7.1 % — AB (ref 4.0–5.6)

## 2021-06-12 NOTE — Patient Instructions (Addendum)
Leave off 25 mcg of levothyroxine   Check blood sugars on waking up 3-4 days a week  Also check blood sugars about 2 hours after meals and do this after different meals by rotation  Recommended blood sugar levels on waking up are 90-130 and about 2 hours after meal is 130-160  Please bring your blood sugar monitor to each visit, thank you

## 2021-06-12 NOTE — Progress Notes (Signed)
Patient ID: [name] [name], female   DOB: [birth year], 85 y.o.   MRN: [id number]           Reason for Appointment: Follow-up for Type 2 Diabetes   History of Present Illness:          Date of diagnosis of type 2 diabetes mellitus: At age 52   approximately      Background history:  She is a poor historian and not clear when she was started on insulin, probably at least since 2018 She thinks she has been on metformin in the past but this was causing diarrhea She does not know what other medications she has taken No prior A1c records are available except from 11/18  Recent history:   A1c is slightly higher at 7.1 compared to 6.4    INSULIN regimen is: Soliqua 18 units in am      Non-insulin hypoglycemic drugs the patient is taking are: None  Current management, blood sugar patterns and problems identified:   She did bring her monitor for download today  As before she is primarily checking her blood sugars in the mornings and only rarely in the afternoon  Her blood sugars are generally higher in the morning overall but on an average about the same as before  However she has not had any episodes of low sugars overnight which she had previously reported  Appetite may be somewhat better than before although this is inconsistent  Last night her blood sugar was 282 and she does not know what she had before testing; subsequently blood sugar was 170 5 in the morning today Blood sugars in the afternoons have been generally better  As before she may sometimes be missing her Soliqua dose in the morning and then may take it midday She does have some protein in the morning with eggs or meat but at times will just have a gravy biscuit She is not able to do any exercise or walking         Side effects from medications have been: Diarrhea from metformin  :    Typical meal intake: Breakfast is   toast, eggs and meat, lunch usually half sandwich with fruit and sweet tea, dinner meat, potato,  salad and tea.  Mostly snacks are fruits              Exercise:   she is trying to get on her exercise bike, little walking limited by balance  Glucose monitoring:  Done 1 times a day         Glucometer:  Accu-Chek     Blood Glucose readings by monitor download    PRE-MEAL Fasting Lunch Dinner Bedtime Overall  Glucose range: 116-183    93-282  Mean/median: 152    150   POST-MEAL PC Breakfast PC Lunch PC Dinner  Glucose range:  94-141 282  Mean/median:        Prior range = 128-176 with AVERAGE 156   Dietician visit, most recent: Few years ago  Weight history:  Wt Readings from Last 3 Encounters:  06/12/21 136 lb 6.4 oz (61.9 kg)  05/21/21 137 lb 6.4 oz (62.3 kg)  03/07/21 138 lb 3.2 oz (62.7 kg)    Glycemic control: A1c in 11/19 was 8.3    Lab Results  Component Value Date   HGBA1C 7.1 (A) 06/12/2021   HGBA1C 6.4 (A) 03/07/2021   HGBA1C 7.4 (H) 12/07/2020   Lab Results  Component Value Date   MICROALBUR 28.2 (  H) 09/06/2020   LDLCALC 39 12/07/2020   CREATININE 1.19 03/07/2021   Lab Results  Component Value Date   MICRALBCREAT 55.6 (H) 09/06/2020    No results found for: FRUCTOSAMINE  Office Visit on 06/12/2021  Component Date Value Ref Range Status   Hemoglobin A1C 06/12/2021 7.1 (A) 4.0 - 5.6 % Final    Allergies as of 06/12/2021   No Known Allergies      Medication List        Accurate as of June 12, 2021  3:28 PM. If you have any questions, ask your nurse or doctor.          Accu-Chek Guide test strip Generic drug: glucose blood USE AS DIRECTED THREE TIMES DAILY   atorvastatin 40 MG tablet Commonly known as: LIPITOR Take 20 mg by mouth daily.   B-12 1000 MCG Tabs Take 1,000 mcg by mouth daily.   Calcium Citrate-Vitamin D 200-250 MG-UNIT Tabs Take 1 tablet by mouth daily.   Centrum tablet Take 1 tablet by mouth daily.   colestipol 1 g tablet Commonly known as: COLESTID Take 1 tablet (1 g total) by mouth 2 (two) times  daily.   Comfort EZ Pen Needles 31G X 5 MM Misc Generic drug: Insulin Pen Needle   diclofenac sodium 1 % Gel Commonly known as: VOLTAREN Apply 2 g topically at bedtime.   donepezil 10 MG tablet Commonly known as: ARICEPT Take 1 tablet (10 mg total) by mouth at bedtime. Start half a tablet daily into 1 month and then increase to 1 tablet daily   DULoxetine 60 MG capsule Commonly known as: CYMBALTA Take 60 mg by mouth daily.   enalapril 20 MG tablet Commonly known as: VASOTEC Take 20 mg by mouth daily.   famotidine 20 MG tablet Commonly known as: PEPCID Take 40 mg by mouth daily.   ferrous sulfate 325 (65 FE) MG EC tablet Take 325 mg by mouth daily with breakfast.   labetalol 200 MG tablet Commonly known as: NORMODYNE Take 200 mg by mouth 2 (two) times daily.   levothyroxine 25 MCG tablet Commonly known as: SYNTHROID Take 25 mcg by mouth daily before breakfast.   magnesium oxide 400 MG tablet Commonly known as: MAG-OX Take 400 mg by mouth daily.   RA PROBIOTIC DIGESTIVE CARE PO Take 1 capsule by mouth daily.   sertraline 25 MG tablet Commonly known as: ZOLOFT Take 25 mg by mouth daily.   Soliqua 100-33 UNT-MCG/ML Sopn Generic drug: Insulin Glargine-Lixisenatide INJECT 22 UNITS SQ DAILY BEFORE BREAKFast What changed: See the new instructions.        Allergies: No Known Allergies  Past Medical History:  Diagnosis Date   Anemia    Anxiety    Arthritis    Chronic back pain    Chronic diarrhea    Diabetes ([place])    Diverticulosis    GERD (gastroesophageal reflux disease)    Hyperlipidemia    Hypertension    Internal hemorrhoids    UTI (urinary tract infection)     Past Surgical History:  Procedure Laterality Date   ABDOMINAL HYSTERECTOMY     CARDIAC CATHETERIZATION     CATARACT EXTRACTION, BILATERAL     CHOLECYSTECTOMY     COLONOSCOPY  11/2015   Dr. Britta Mccreedy: hyperplastic polyp from sigmoid colon. biopsies from ascending colon were negative for  microscopic colitis.    COLONOSCOPY N/A 03/31/2018   Dr. Gala Romney: Diverticulosis, random colon biopsies negative, hemorrhoids.  No future screening or surveillance colonoscopy is  recommended.   TONSILLECTOMY     TRIGGER FINGER RELEASE      Family History  Problem Relation Age of Onset   Heart attack Sister    Transient ischemic attack Sister    Heart attack Mother    Diabetes Mother    Transient ischemic attack Mother    Heart attack Father    Diabetes Son    Diabetes Maternal Aunt    Rectal cancer Other        anal CA   Colon cancer Neg Hx    Celiac disease Neg Hx    Inflammatory bowel disease Neg Hx    Stomach cancer Neg Hx     Social History:  reports that she has never smoked. She has never used smokeless tobacco. She reports that she does not drink alcohol and does not use drugs.   Review of Systems   Lipid history: Treated by PCP with 40 mg Lipitor long-term     Lab Results  Component Value Date   CHOL 114 12/07/2020   HDL 49 12/07/2020   LDLCALC 39 12/07/2020   TRIG 129 12/07/2020   CHOLHDL 2.3 12/07/2020           Hypertension: Has been present for several years treated by PCP, is on enalapril 20 mg, Normodyne 200 mg and hydrochlorothiazide Blood pressure may be high on first measurement on her office visits  BP Readings from Last 3 Encounters:  06/12/21 130/70  05/21/21 (!) 177/78  03/07/21 (!) 158/78   Renal function history:  Lab Results  Component Value Date   CREATININE 1.19 03/07/2021   CREATININE 0.90 12/06/2020   CREATININE 0.87 12/06/2020     Most recent eye exam was in 07/2020 but report not available, due for follow-up  Most recent foot exam: 2/20  Currently known complications of diabetes: Diabetic peripheral neuropathy  Has symptoms of numbness in feet and fingers  She previously had a TSH of 5.4 done in 01/2019, subsequently normal However in 3/22 repeat TSH within 2 weeks after normal level was 6.9 and she was started on 25 mcg  of levothyroxine at the suggestion of the neurologist   The family does not think she feels any better with taking levothyroxine Recently has been started on Aricept Most recent TSH was normal  Lab Results  Component Value Date   TSH 1.17 03/07/2021   TSH 6.860 (H) 12/17/2020   TSH 2.16 12/05/2020   FREET4 1.06 03/07/2021   FREET4 0.71 11/04/2019    Physical Examination:  BP 130/70   Pulse 64   Ht '5\' 3"'$  (1.6 m)   Wt 136 lb 6.4 oz (61.9 kg)   SpO2 99%   BMI 24.16 kg/m      ASSESSMENT:  Diabetes type 2, insulin requiring, with neuropathy  See history of present illness for detailed discussion of current diabetes management, blood sugar patterns and problems identified  Her A1c is slightly higher at 7.1  Considering her age her blood sugar is fairly well controlled on monotherapy with 18 units of Soliqua She is able to take this daily although timing of the injection may be variable in the morning She does not have any consistently high readings in the mornings although overall they are averaging about 150 No hypoglycemia reported Her appetite is variable but has not lost much weight since last visit   Previous history of high TSH: Likely has subclinical hypothyroidism  However also TSH is likely inconsistent Clinically has not benefited from 25 mcg  of levothyroxine   HYPERTENSION: Blood pressure is relatively better today  PLAN:    Glucose monitoring: She will set an alarm to remind her to check blood sugars after dinner regularly  If she has consistently high readings in the mornings may need to consider her Soliqua dose Meanwhile continue 18 units Continue bedtime snack at night  Discussed blood sugar targets She will have a bedtime snack daily To take the [place] consistently before starting to eat in the morning Discussed needing to add some protein with breakfast every day and not just eating bread Recheck thyroid levels today and if TSH is low normal may  consider stopping levothyroxine She will have labs drawn including follow-up lipids at upcoming visit with PCP    Follow-up in 3 months  Total visit time including counseling =30 minutes    Patient Instructions  Leave off 25 mcg of levothyroxine   Check blood sugars on waking up 3-4 days a week  Also check blood sugars about 2 hours after meals and do this after different meals by rotation  Recommended blood sugar levels on waking up are 90-130 and about 2 hours after meal is 130-160  Please bring your blood sugar monitor to each visit, thank you       Elayne Snare 06/12/2021, 3:28 PM   Note: This office note was prepared with Dragon voice recognition system technology. Any transcriptional errors that result from this process are unintentional.

## 2021-06-20 DIAGNOSIS — K219 Gastro-esophageal reflux disease without esophagitis: Secondary | ICD-10-CM | POA: Diagnosis not present

## 2021-06-20 DIAGNOSIS — E1140 Type 2 diabetes mellitus with diabetic neuropathy, unspecified: Secondary | ICD-10-CM | POA: Diagnosis not present

## 2021-06-20 DIAGNOSIS — R5383 Other fatigue: Secondary | ICD-10-CM | POA: Diagnosis not present

## 2021-06-20 DIAGNOSIS — E039 Hypothyroidism, unspecified: Secondary | ICD-10-CM | POA: Diagnosis not present

## 2021-06-20 DIAGNOSIS — N183 Chronic kidney disease, stage 3 unspecified: Secondary | ICD-10-CM | POA: Diagnosis not present

## 2021-06-20 DIAGNOSIS — I10 Essential (primary) hypertension: Secondary | ICD-10-CM | POA: Diagnosis not present

## 2021-06-25 DIAGNOSIS — Z6824 Body mass index (BMI) 24.0-24.9, adult: Secondary | ICD-10-CM | POA: Diagnosis not present

## 2021-06-25 DIAGNOSIS — D649 Anemia, unspecified: Secondary | ICD-10-CM | POA: Diagnosis not present

## 2021-06-25 DIAGNOSIS — I10 Essential (primary) hypertension: Secondary | ICD-10-CM | POA: Diagnosis not present

## 2021-06-25 DIAGNOSIS — Z23 Encounter for immunization: Secondary | ICD-10-CM | POA: Diagnosis not present

## 2021-06-25 DIAGNOSIS — E7801 Familial hypercholesterolemia: Secondary | ICD-10-CM | POA: Diagnosis not present

## 2021-06-25 DIAGNOSIS — Z0001 Encounter for general adult medical examination with abnormal findings: Secondary | ICD-10-CM | POA: Diagnosis not present

## 2021-06-25 DIAGNOSIS — R413 Other amnesia: Secondary | ICD-10-CM | POA: Diagnosis not present

## 2021-06-25 DIAGNOSIS — E1165 Type 2 diabetes mellitus with hyperglycemia: Secondary | ICD-10-CM | POA: Diagnosis not present

## 2021-07-17 ENCOUNTER — Other Ambulatory Visit: Payer: Self-pay | Admitting: Endocrinology

## 2021-08-07 ENCOUNTER — Telehealth: Payer: Self-pay | Admitting: Gastroenterology

## 2021-08-07 ENCOUNTER — Other Ambulatory Visit: Payer: Self-pay

## 2021-08-07 DIAGNOSIS — K862 Cyst of pancreas: Secondary | ICD-10-CM

## 2021-08-07 NOTE — Telephone Encounter (Signed)
Below is correspondence sent to and receive from [employer]. They will be calling pt to schedule exam. No further action required.  Aleatha Borer, LPN  Pait, April H  My apologies. It appears the nurse before me placed the incorrect order. It has been corrected and placed today. The previous order has been discontinued    Previous Messages   ----- Message -----  From: Sheldon Silvan, April H  Sent: 08/07/2021  10:46 AM EST  To: Skipper Cliche, LPN  Subject: RE: MRI/MRCP                                   I don't see a MRCP order just MRI abd.  ----- Message -----  From: Aleatha Borer, LPN  Sent: 20/05/4708  10:31 AM EST  To: April H Pait, Roosvelt Maser  Subject: FW: MRI/MRCP                                   Pt dtr is calling to advise [place] that a new order needs to be placed. However, after reviewing order it appears the order is not set to expire until 09/27/21. Is she unable to be scheduled BEFORE the expiration date?    Future Order Information   Expected Expires Previous Expiration Date  08/05/2021 09/27/2021 08/09/2021    ----- Message -----  From: Aleatha Borer, LPN  Sent: 62/04/3661   7:40 AM EST  To: April H Pait, Roosvelt Maser  Subject: MRI/MRCP                                       [facility]  Phone: [phone]  Fax: [phone]   Imaging Ordered: MRI/MRCP   Diagnosis: Pancreatic cyst   Ordering Provider: Dr. Tarri Glenn   Is a Prior Authorization needed? We are in the process of obtaining it now   Is the patient Diabetic? Yes   Does the patient have Hypertension? Yes   Does the patient have any implanted devices or hardware? No   Date of last BUN/Creat, if needed? 03/07/21   Patient Weight? 136#   Is the patient able to get on the table? Yes   Has the patient been diagnosed with COVID? No   Is the patient waiting on COVID testing results? No   Thank you for your assistance!   [facility]

## 2021-08-07 NOTE — Telephone Encounter (Signed)
Inbound call from pt's daughter [name] requesting a call back stating that a new order was suppose to be put in for her mother. Please advise. Thank you.

## 2021-08-08 ENCOUNTER — Encounter (HOSPITAL_COMMUNITY)
Admission: EM | Disposition: A | Discharge status: Skilled Nursing Facility | Payer: Self-pay | Source: Home / Self Care | Attending: General Surgery

## 2021-08-08 ENCOUNTER — Other Ambulatory Visit: Payer: Self-pay

## 2021-08-08 ENCOUNTER — Encounter (HOSPITAL_COMMUNITY): Payer: Self-pay

## 2021-08-08 ENCOUNTER — Emergency Department (HOSPITAL_COMMUNITY): Payer: Medicare PPO | Admitting: Anesthesiology

## 2021-08-08 ENCOUNTER — Inpatient Hospital Stay (HOSPITAL_COMMUNITY)
Admission: EM | Admit: 2021-08-08 | Discharge: 2021-08-14 | DRG: 344 | Disposition: A | Discharge status: Skilled Nursing Facility | Payer: Medicare PPO | Attending: General Surgery | Admitting: General Surgery

## 2021-08-08 ENCOUNTER — Emergency Department (HOSPITAL_COMMUNITY): Payer: Medicare PPO

## 2021-08-08 DIAGNOSIS — J9811 Atelectasis: Secondary | ICD-10-CM | POA: Diagnosis not present

## 2021-08-08 DIAGNOSIS — Z20822 Contact with and (suspected) exposure to covid-19: Secondary | ICD-10-CM | POA: Diagnosis present

## 2021-08-08 DIAGNOSIS — Z79899 Other long term (current) drug therapy: Secondary | ICD-10-CM

## 2021-08-08 DIAGNOSIS — R109 Unspecified abdominal pain: Secondary | ICD-10-CM | POA: Diagnosis not present

## 2021-08-08 DIAGNOSIS — I509 Heart failure, unspecified: Secondary | ICD-10-CM | POA: Diagnosis not present

## 2021-08-08 DIAGNOSIS — I10 Essential (primary) hypertension: Secondary | ICD-10-CM | POA: Diagnosis present

## 2021-08-08 DIAGNOSIS — M549 Dorsalgia, unspecified: Secondary | ICD-10-CM | POA: Diagnosis present

## 2021-08-08 DIAGNOSIS — M1990 Unspecified osteoarthritis, unspecified site: Secondary | ICD-10-CM | POA: Diagnosis present

## 2021-08-08 DIAGNOSIS — Z8249 Family history of ischemic heart disease and other diseases of the circulatory system: Secondary | ICD-10-CM

## 2021-08-08 DIAGNOSIS — Z800 Family history of malignant neoplasm of digestive organs: Secondary | ICD-10-CM

## 2021-08-08 DIAGNOSIS — R198 Other specified symptoms and signs involving the digestive system and abdomen: Secondary | ICD-10-CM | POA: Diagnosis present

## 2021-08-08 DIAGNOSIS — T184XXA Foreign body in colon, initial encounter: Principal | ICD-10-CM | POA: Diagnosis present

## 2021-08-08 DIAGNOSIS — E119 Type 2 diabetes mellitus without complications: Secondary | ICD-10-CM | POA: Diagnosis present

## 2021-08-08 DIAGNOSIS — G8929 Other chronic pain: Secondary | ICD-10-CM | POA: Diagnosis present

## 2021-08-08 DIAGNOSIS — Z833 Family history of diabetes mellitus: Secondary | ICD-10-CM

## 2021-08-08 DIAGNOSIS — Z79890 Hormone replacement therapy (postmenopausal): Secondary | ICD-10-CM

## 2021-08-08 DIAGNOSIS — E785 Hyperlipidemia, unspecified: Secondary | ICD-10-CM | POA: Diagnosis present

## 2021-08-08 DIAGNOSIS — Z48815 Encounter for surgical aftercare following surgery on the digestive system: Secondary | ICD-10-CM | POA: Diagnosis not present

## 2021-08-08 DIAGNOSIS — R0902 Hypoxemia: Secondary | ICD-10-CM

## 2021-08-08 DIAGNOSIS — N179 Acute kidney failure, unspecified: Secondary | ICD-10-CM | POA: Diagnosis not present

## 2021-08-08 DIAGNOSIS — R1013 Epigastric pain: Secondary | ICD-10-CM | POA: Diagnosis not present

## 2021-08-08 DIAGNOSIS — R188 Other ascites: Secondary | ICD-10-CM | POA: Diagnosis present

## 2021-08-08 DIAGNOSIS — E876 Hypokalemia: Secondary | ICD-10-CM | POA: Diagnosis not present

## 2021-08-08 DIAGNOSIS — R000 Tachycardia, unspecified: Secondary | ICD-10-CM | POA: Diagnosis not present

## 2021-08-08 DIAGNOSIS — K529 Noninfective gastroenteritis and colitis, unspecified: Secondary | ICD-10-CM | POA: Diagnosis present

## 2021-08-08 DIAGNOSIS — K631 Perforation of intestine (nontraumatic): Secondary | ICD-10-CM | POA: Diagnosis present

## 2021-08-08 DIAGNOSIS — F419 Anxiety disorder, unspecified: Secondary | ICD-10-CM | POA: Diagnosis not present

## 2021-08-08 DIAGNOSIS — R0602 Shortness of breath: Secondary | ICD-10-CM | POA: Diagnosis not present

## 2021-08-08 HISTORY — PX: LAPAROTOMY: SHX154

## 2021-08-08 LAB — CBC WITH DIFFERENTIAL/PLATELET
Abs Immature Granulocytes: 0.04 10*3/uL (ref 0.00–0.07)
Basophils Absolute: 0 10*3/uL (ref 0.0–0.1)
Basophils Relative: 0 %
Eosinophils Absolute: 0.1 10*3/uL (ref 0.0–0.5)
Eosinophils Relative: 1 %
HCT: 31.5 % — ABNORMAL LOW (ref 36.0–46.0)
Hemoglobin: 10.3 g/dL — ABNORMAL LOW (ref 12.0–15.0)
Immature Granulocytes: 0 %
Lymphocytes Relative: 14 %
Lymphs Abs: 1.5 10*3/uL (ref 0.7–4.0)
MCH: 29.4 pg (ref 26.0–34.0)
MCHC: 32.7 g/dL (ref 30.0–36.0)
MCV: 90 fL (ref 80.0–100.0)
Monocytes Absolute: 0.8 10*3/uL (ref 0.1–1.0)
Monocytes Relative: 7 %
Neutro Abs: 8.7 10*3/uL — ABNORMAL HIGH (ref 1.7–7.7)
Neutrophils Relative %: 78 %
Platelets: 213 10*3/uL (ref 150–400)
RBC: 3.5 MIL/uL — ABNORMAL LOW (ref 3.87–5.11)
RDW: 13.5 % (ref 11.5–15.5)
WBC: 11.2 10*3/uL — ABNORMAL HIGH (ref 4.0–10.5)
nRBC: 0 % (ref 0.0–0.2)

## 2021-08-08 LAB — COMPREHENSIVE METABOLIC PANEL
ALT: 17 U/L (ref 0–44)
AST: 18 U/L (ref 15–41)
Albumin: 3.6 g/dL (ref 3.5–5.0)
Alkaline Phosphatase: 83 U/L (ref 38–126)
Anion gap: 8 (ref 5–15)
BUN: 28 mg/dL — ABNORMAL HIGH (ref 8–23)
CO2: 27 mmol/L (ref 22–32)
Calcium: 9.4 mg/dL (ref 8.9–10.3)
Chloride: 103 mmol/L (ref 98–111)
Creatinine, Ser: 1.1 mg/dL — ABNORMAL HIGH (ref 0.44–1.00)
GFR, Estimated: 50 mL/min — ABNORMAL LOW (ref >60)
Glucose, Bld: 128 mg/dL — ABNORMAL HIGH (ref 70–99)
Potassium: 3.6 mmol/L (ref 3.5–5.1)
Sodium: 138 mmol/L (ref 135–145)
Total Bilirubin: 0.7 mg/dL (ref 0.3–1.2)
Total Protein: 6.2 g/dL — ABNORMAL LOW (ref 6.5–8.1)

## 2021-08-08 LAB — PREPARE RBC (CROSSMATCH)

## 2021-08-08 LAB — LIPASE, BLOOD: Lipase: 26 U/L (ref 11–51)

## 2021-08-08 LAB — ABO/RH: ABO/RH(D): O POS

## 2021-08-08 LAB — RESP PANEL BY RT-PCR (FLU A&B, COVID) ARPGX2
Influenza A by PCR: NEGATIVE
Influenza B by PCR: NEGATIVE
SARS Coronavirus 2 by RT PCR: NEGATIVE

## 2021-08-08 SURGERY — LAPAROTOMY, EXPLORATORY
Anesthesia: General

## 2021-08-08 MED ORDER — SODIUM CHLORIDE 0.9 % IV SOLN
2.0000 g | INTRAVENOUS | Status: AC
  Administered 2021-08-08: 2 g via INTRAVENOUS
  Filled 2021-08-08 (×2): qty 2

## 2021-08-08 MED ORDER — MORPHINE SULFATE (PF) 4 MG/ML IV SOLN
4.0000 mg | Freq: Once | INTRAVENOUS | Status: AC
  Administered 2021-08-08: 4 mg via INTRAVENOUS
  Filled 2021-08-08: qty 1

## 2021-08-08 MED ORDER — SODIUM CHLORIDE 0.9 % IV SOLN: 10.0000 mL/h | Freq: Once | INTRAVENOUS | Status: DC

## 2021-08-08 MED ORDER — LIDOCAINE HCL (PF) 2 % IJ SOLN
INTRAMUSCULAR | Status: AC
  Filled 2021-08-08: qty 5

## 2021-08-08 MED ORDER — LACTATED RINGERS IV BOLUS
1000.0000 mL | Freq: Once | INTRAVENOUS | Status: AC
  Administered 2021-08-08: 1000 mL via INTRAVENOUS

## 2021-08-08 MED ORDER — LACTATED RINGERS IV SOLN: INTRAVENOUS | Status: DC | PRN

## 2021-08-08 MED ORDER — ROCURONIUM BROMIDE 10 MG/ML (PF) SYRINGE
PREFILLED_SYRINGE | INTRAVENOUS | Status: AC
  Filled 2021-08-08: qty 10

## 2021-08-08 MED ORDER — PROPOFOL 10 MG/ML IV BOLUS
INTRAVENOUS | Status: AC
  Filled 2021-08-08: qty 20

## 2021-08-08 MED ORDER — DEXAMETHASONE SODIUM PHOSPHATE 10 MG/ML IJ SOLN
INTRAMUSCULAR | Status: AC
  Filled 2021-08-08: qty 1

## 2021-08-08 MED ORDER — PHENYLEPHRINE HCL-NACL 20-0.9 MG/250ML-% IV SOLN
INTRAVENOUS | Status: AC
  Filled 2021-08-08: qty 250

## 2021-08-08 MED ORDER — HYDRALAZINE HCL 20 MG/ML IJ SOLN
20.0000 mg | Freq: Once | INTRAMUSCULAR | Status: AC
  Administered 2021-08-08: 20 mg via INTRAVENOUS
  Filled 2021-08-08: qty 1

## 2021-08-08 MED ORDER — SUGAMMADEX SODIUM 200 MG/2ML IV SOLN
INTRAVENOUS | Status: DC | PRN
  Administered 2021-08-08: 150 mg via INTRAVENOUS

## 2021-08-08 MED ORDER — SODIUM CHLORIDE 0.9 % IR SOLN
Status: DC | PRN
  Administered 2021-08-08 (×3): 1000 mL

## 2021-08-08 MED ORDER — PIPERACILLIN-TAZOBACTAM 3.375 G IVPB 30 MIN
3.3750 g | Freq: Once | INTRAVENOUS | Status: AC
  Administered 2021-08-08: 3.375 g via INTRAVENOUS
  Filled 2021-08-08: qty 50

## 2021-08-08 MED ORDER — CHLORHEXIDINE GLUCONATE CLOTH 2 % EX PADS
6.0000 | MEDICATED_PAD | Freq: Once | CUTANEOUS | Status: AC
  Administered 2021-08-08: 6 via TOPICAL

## 2021-08-08 MED ORDER — BUPIVACAINE LIPOSOME 1.3 % IJ SUSP
INTRAMUSCULAR | Status: AC
  Filled 2021-08-08: qty 20

## 2021-08-08 MED ORDER — SODIUM CHLORIDE 0.9 % IV SOLN: INTRAVENOUS | Status: DC

## 2021-08-08 MED ORDER — EPHEDRINE SULFATE-NACL 50-0.9 MG/10ML-% IV SOSY
PREFILLED_SYRINGE | INTRAVENOUS | Status: DC | PRN
  Administered 2021-08-08: 10 mg via INTRAVENOUS

## 2021-08-08 MED ORDER — IOHEXOL 300 MG/ML  SOLN
100.0000 mL | Freq: Once | INTRAMUSCULAR | Status: AC | PRN
  Administered 2021-08-08: 80 mL via INTRAVENOUS

## 2021-08-08 MED ORDER — FENTANYL CITRATE (PF) 250 MCG/5ML IJ SOLN
INTRAMUSCULAR | Status: AC
  Filled 2021-08-08: qty 5

## 2021-08-08 MED ORDER — ROCURONIUM BROMIDE 10 MG/ML (PF) SYRINGE
PREFILLED_SYRINGE | INTRAVENOUS | Status: DC | PRN
  Administered 2021-08-08 (×3): 50 mg via INTRAVENOUS

## 2021-08-08 MED ORDER — LACTATED RINGERS IV SOLN
INTRAVENOUS | Status: DC
Start: 2021-08-08 — End: 2021-08-12

## 2021-08-08 MED ORDER — ONDANSETRON HCL 4 MG/2ML IJ SOLN
INTRAMUSCULAR | Status: AC
  Filled 2021-08-08: qty 2

## 2021-08-08 MED ORDER — SUCCINYLCHOLINE CHLORIDE 200 MG/10ML IV SOSY
PREFILLED_SYRINGE | INTRAVENOUS | Status: AC
  Filled 2021-08-08: qty 10

## 2021-08-08 MED ORDER — CHLORHEXIDINE GLUCONATE CLOTH 2 % EX PADS: 6.0000 | MEDICATED_PAD | Freq: Once | CUTANEOUS | Status: DC

## 2021-08-08 MED ORDER — ONDANSETRON HCL 4 MG/2ML IJ SOLN
4.0000 mg | Freq: Once | INTRAMUSCULAR | Status: AC
  Administered 2021-08-08: 4 mg via INTRAVENOUS
  Filled 2021-08-08: qty 2

## 2021-08-08 MED ORDER — BUPIVACAINE LIPOSOME 1.3 % IJ SUSP
INTRAMUSCULAR | Status: DC | PRN
  Administered 2021-08-08: 20 mL

## 2021-08-08 SURGICAL SUPPLY — 59 items
APPLIER CLIP 11 MED OPEN (CLIP)
APPLIER CLIP 13 LRG OPEN (CLIP)
BARRIER SKIN 2 1/4 (OSTOMY) IMPLANT
BARRIER SKIN 2 3/4 (OSTOMY) IMPLANT
BARRIER SKIN 2 3/4 INCH (OSTOMY)
BARRIER SKIN OD2.25 2 3/4 FLNG (OSTOMY) IMPLANT
CHLORAPREP W/TINT 26 (MISCELLANEOUS) ×3 IMPLANT
CLAMP POUCH DRAINAGE QUIET (OSTOMY) IMPLANT
CLIP APPLIE 11 MED OPEN (CLIP) IMPLANT
CLIP APPLIE 13 LRG OPEN (CLIP) IMPLANT
CLOTH BEACON ORANGE TIMEOUT ST (SAFETY) ×3 IMPLANT
COVER LIGHT HANDLE STERIS (MISCELLANEOUS) ×6 IMPLANT
DRAPE WARM FLUID 44X44 (DRAPES) ×3 IMPLANT
DRSG OPSITE POSTOP 4X10 (GAUZE/BANDAGES/DRESSINGS) ×3 IMPLANT
DRSG OPSITE POSTOP 4X8 (GAUZE/BANDAGES/DRESSINGS) IMPLANT
ELECT BLADE 6 FLAT ULTRCLN (ELECTRODE) IMPLANT
ELECT REM PT RETURN 9FT ADLT (ELECTROSURGICAL) ×3
ELECTRODE REM PT RTRN 9FT ADLT (ELECTROSURGICAL) ×1 IMPLANT
GLOVE SURG ENC MOIS LTX SZ6.5 (GLOVE) ×3 IMPLANT
GLOVE SURG POLYISO LF SZ7.5 (GLOVE) ×3 IMPLANT
GLOVE SURG UNDER POLY LF SZ7 (GLOVE) ×9 IMPLANT
GOWN STRL REUS W/TWL LRG LVL3 (GOWN DISPOSABLE) ×9 IMPLANT
HANDLE SUCTION POOLE (INSTRUMENTS) ×1 IMPLANT
INST SET MAJOR GENERAL (KITS) ×3 IMPLANT
KIT REMOVER STAPLE SKIN (MISCELLANEOUS) IMPLANT
KIT TURNOVER KIT A (KITS) ×3 IMPLANT
LIGASURE IMPACT 36 18CM CVD LR (INSTRUMENTS) ×3 IMPLANT
MANIFOLD NEPTUNE II (INSTRUMENTS) ×3 IMPLANT
NEEDLE HYPO 18GX1.5 BLUNT FILL (NEEDLE) ×3 IMPLANT
NEEDLE HYPO 21X1.5 SAFETY (NEEDLE) ×3 IMPLANT
NS IRRIG 1000ML POUR BTL (IV SOLUTION) ×9 IMPLANT
PACK MAJOR ABDOMINAL (CUSTOM PROCEDURE TRAY) ×3 IMPLANT
PAD ARMBOARD 7.5X6 YLW CONV (MISCELLANEOUS) ×3 IMPLANT
PENCIL SMOKE EVACUATOR COATED (MISCELLANEOUS) ×3 IMPLANT
POUCH OSTOMY 2 3/4  H 3804 (WOUND CARE)
POUCH OSTOMY 2 PC DRNBL 2.75 (WOUND CARE) IMPLANT
RELOAD LINEAR CUT PROX 55 BLUE (ENDOMECHANICALS) IMPLANT
RELOAD PROXIMATE 75MM BLUE (ENDOMECHANICALS) IMPLANT
RELOAD STAPLE 55 3.8 BLU REG (ENDOMECHANICALS) IMPLANT
RELOAD STAPLE 75 3.8 BLU REG (ENDOMECHANICALS) IMPLANT
RETRACTOR WND ALEXIS-O 25 LRG (MISCELLANEOUS) ×1 IMPLANT
RETRACTOR WOUND ALXS 18CM MED (MISCELLANEOUS) IMPLANT
RTRCTR WOUND ALEXIS O 18CM MED (MISCELLANEOUS)
RTRCTR WOUND ALEXIS O 25CM LRG (MISCELLANEOUS) ×3
SET BASIN LINEN APH (SET/KITS/TRAYS/PACK) ×3 IMPLANT
SPONGE T-LAP 18X18 ~~LOC~~+RFID (SPONGE) ×3 IMPLANT
STAPLER GUN LINEAR PROX 60 (STAPLE) IMPLANT
STAPLER PROXIMATE 55 BLUE (STAPLE) IMPLANT
STAPLER PROXIMATE 75MM BLUE (STAPLE) IMPLANT
STAPLER VISISTAT (STAPLE) ×3 IMPLANT
SUCTION POOLE HANDLE (INSTRUMENTS) ×3
SUT CHROMIC 0 SH (SUTURE) IMPLANT
SUT CHROMIC 2 0 SH (SUTURE) IMPLANT
SUT CHROMIC 3 0 SH 27 (SUTURE) IMPLANT
SUT PDS AB CT VIOLET #0 27IN (SUTURE) ×6 IMPLANT
SUT PROLENE 2 0 SH 30 (SUTURE) IMPLANT
SUT SILK 3 0 SH CR/8 (SUTURE) ×3 IMPLANT
SYR 20ML LL LF (SYRINGE) ×6 IMPLANT
TRAY FOLEY MTR SLVR 16FR STAT (SET/KITS/TRAYS/PACK) ×3 IMPLANT

## 2021-08-08 NOTE — ED Triage Notes (Signed)
Patient developed abdominal pain this am that is progressively worsening. Poor appetite. Pain on palpation in all quads. No N/V/D. Pain 10/10.

## 2021-08-08 NOTE — Anesthesia Preprocedure Evaluation (Addendum)
Anesthesia Evaluation  Patient identified by MRN, date of birth, ID band Patient awake    Reviewed: Allergy & Precautions, NPO status , Patient's Chart, lab work & pertinent test results, reviewed documented beta blocker date and time   History of Anesthesia Complications Negative for: history of anesthetic complications  Airway Mallampati: II  TM Distance: >3 FB Neck ROM: Full    Dental  (+) Dental Advisory Given, Upper Dentures, Missing   Pulmonary shortness of breath and with exertion,           Cardiovascular Exercise Tolerance: Good hypertension, Pt. on medications and Pt. on home beta blockers  Rhythm:Regular Rate:Tachycardia     Neuro/Psych PSYCHIATRIC DISORDERS Anxiety TIA   GI/Hepatic Neg liver ROS, GERD  Medicated and Controlled,  Endo/Other  diabetes, Well Controlled, Type 2, Insulin Dependent  Renal/GU negative Renal ROS     Musculoskeletal  (+) Arthritis , Osteoarthritis,    Abdominal   Peds  Hematology  (+) anemia ,   Anesthesia Other Findings   Reproductive/Obstetrics                            Anesthesia Physical Anesthesia Plan  ASA: 3 and emergent  Anesthesia Plan: General   Post-op Pain Management:    Induction: Intravenous  PONV Risk Score and Plan: 4 or greater and Ondansetron and Dexamethasone  Airway Management Planned: Oral ETT  Additional Equipment:   Intra-op Plan:   Post-operative Plan: Extubation in OR and Possible Post-op intubation/ventilation  Informed Consent: I have reviewed the patients History and Physical, chart, labs and discussed the procedure including the risks, benefits and alternatives for the proposed anesthesia with the patient or authorized representative who has indicated his/her understanding and acceptance.     Dental advisory given  Plan Discussed with: CRNA and Surgeon  Anesthesia Plan Comments:         Anesthesia  Quick Evaluation

## 2021-08-08 NOTE — ED Notes (Signed)
Patient transported to CT 

## 2021-08-08 NOTE — ED Notes (Addendum)
Pt family updated on information. Daughter and husband at bedside. Pt dressed into gown and all jewelry and belongings given to pt daughter. Consent for surgery filled out awaiting dr bridges for signature. Bridges on way to talk with patient and family.

## 2021-08-08 NOTE — H&P (Signed)
[facility] History and Physical  Reason for Referral:[place]  Referring Physician: Dr. Sabra Heck    Chief Complaint   Abdominal Pain     [name] [name] is a 85 y.o. female.  HPI: [name] [name] is a 85 yo who lives at home with her husband and has a history of chronic diarrhea, GERD, HTN and diabetes. [name] [name] was at home and started having abdominal pain that was generalized and started to ultimately radiate up to her upper abdomen. [name] [name] had associated nausea but no vomiting. [name] [name] says the pain was worse as the day progressed. [name] [name] was originally hypertensive and was given medication in the ED. [name] [name] takes medication for chronic diarrhea. [name] [name] is not sure if [name] [name] ever had any diverticulitis. [name] [name] sees GI regularly.  [name] [name] is here with her daughter today. [name] [name] says her pain is getting worse.    Past Medical History:  Diagnosis Date   Anemia    Anxiety    Arthritis    Chronic back pain    Chronic diarrhea    Diabetes ([place])    Diverticulosis    GERD (gastroesophageal reflux disease)    Hyperlipidemia    Hypertension    Internal hemorrhoids    UTI (urinary tract infection)     Past Surgical History:  Procedure Laterality Date   ABDOMINAL HYSTERECTOMY     CARDIAC CATHETERIZATION     CATARACT EXTRACTION, BILATERAL     CHOLECYSTECTOMY     COLONOSCOPY  11/2015   Dr. Britta Mccreedy: hyperplastic polyp from sigmoid colon. biopsies from ascending colon were negative for microscopic colitis.    COLONOSCOPY N/A 03/31/2018   Dr. Gala Romney: Diverticulosis, random colon biopsies negative, hemorrhoids.  No future screening or surveillance colonoscopy is recommended.   TONSILLECTOMY     TRIGGER FINGER RELEASE      Family History  Problem Relation Age of Onset   Heart attack Sister    Transient ischemic attack Sister    Heart attack Mother    Diabetes Mother    Transient ischemic attack Mother    Heart attack Father    Diabetes Son    Diabetes Maternal Aunt    Rectal cancer Other        anal CA    Colon cancer Neg Hx    Celiac disease Neg Hx    Inflammatory bowel disease Neg Hx    Stomach cancer Neg Hx     Social History   Tobacco Use   Smoking status: Never   Smokeless tobacco: Never  Vaping Use   Vaping Use: Never used  Substance Use Topics   Alcohol use: No    Alcohol/week: 0.0 standard drinks   Drug use: No    Medications: I have reviewed the patient's current medications. Current Facility-Administered Medications  Medication Dose Route Frequency Provider Last Rate Last Admin   0.9 %  sodium chloride infusion   Intravenous Continuous Noemi Chapel, MD 250 mL/hr at 08/08/21 1909 New Bag at 08/08/21 1909   0.9 %  sodium chloride infusion  10 mL/hr Intravenous Once Battula, Rajamani C, MD       cefoTEtan (CEFOTAN) 2 g in sodium chloride 0.9 % 100 mL IVPB  2 g Intravenous On Call to OR Virl Cagey, MD       Chlorhexidine Gluconate Cloth 2 % PADS 6 each  6 each Topical Once Virl Cagey, MD       lactated ringers infusion   Intravenous Continuous Virl Cagey, MD 100 mL/hr  at 08/08/21 2101 New Bag at 08/08/21 2101   Current Outpatient Medications  Medication Sig Dispense Refill Last Dose   ACCU-CHEK GUIDE test strip USE AS DIRECTED THREE TIMES DAILY 150 strip 3    atorvastatin (LIPITOR) 40 MG tablet Take 20 mg by mouth daily.       Calcium Citrate-Vitamin D 200-250 MG-UNIT TABS Take 1 tablet by mouth daily.      colestipol (COLESTID) 1 g tablet Take 1 tablet (1 g total) by mouth 2 (two) times daily. 180 tablet 3    COMFORT EZ PEN NEEDLES 31G X 5 MM MISC       Cyanocobalamin (B-12) 1000 MCG TABS Take 1,000 mcg by mouth daily.       diclofenac sodium (VOLTAREN) 1 % GEL Apply 2 g topically at bedtime.      donepezil (ARICEPT) 10 MG tablet Take 1 tablet (10 mg total) by mouth at bedtime. Start half a tablet daily into 1 month and then increase to 1 tablet daily 30 tablet 3    DULoxetine (CYMBALTA) 60 MG capsule Take 60 mg by mouth daily.      enalapril  (VASOTEC) 20 MG tablet Take 20 mg by mouth daily.      famotidine (PEPCID) 20 MG tablet Take 40 mg by mouth daily.      ferrous sulfate 325 (65 FE) MG EC tablet Take 325 mg by mouth daily with breakfast.      labetalol (NORMODYNE) 200 MG tablet Take 200 mg by mouth 2 (two) times daily.      Lactobacillus Rhamnosus, GG, (RA PROBIOTIC DIGESTIVE CARE PO) Take 1 capsule by mouth daily.      levothyroxine (SYNTHROID) 25 MCG tablet Take 25 mcg by mouth daily before breakfast.      magnesium oxide (MAG-OX) 400 MG tablet Take 400 mg by mouth daily.      Multiple Vitamins-Minerals (CENTRUM) tablet Take 1 tablet by mouth daily.      sertraline (ZOLOFT) 25 MG tablet Take 25 mg by mouth daily.      SOLIQUA 100-33 UNT-MCG/ML SOPN INJECT 22 UNITS SQ DAILY BEFORE BREAKFast (Patient taking differently: 18 Units.) 15 mL 1     No Known Allergies    ROS:  A comprehensive review of systems was negative except for: Gastrointestinal: positive for abdominal pain, diarrhea, and nausea  Blood pressure 104/64, pulse (!) 109, temperature 98.4 F (36.9 C), temperature source Oral, resp. rate (!) 24, height 5\' 3"  (1.6 m), weight 60.3 kg, SpO2 94 %. Physical Exam Vitals reviewed.  Constitutional:      Appearance: [name] [name] is well-developed.  HENT:     Head: Normocephalic.  Eyes:     Pupils: Pupils are equal, round, and reactive to light.  Cardiovascular:     Rate and Rhythm: Normal rate.  Pulmonary:     Effort: Pulmonary effort is normal.  Abdominal:     General: There is distension.     Palpations: Abdomen is soft.     Tenderness: There is generalized abdominal tenderness. There is guarding.  Musculoskeletal:     Comments: Moves all extremities   Skin:    General: Skin is warm.  Neurological:     General: No focal deficit present.     Mental Status: [name] [name] is alert and oriented to person, place, and time.  Psychiatric:        Mood and Affect: Mood normal.        Behavior: Behavior normal.     Results: Results  for orders placed or performed during the hospital encounter of 08/08/21 (from the past 48 hour(s))  CBC with Differential/Platelet     Status: Abnormal   Collection Time: 08/08/21  6:58 PM  Result Value Ref Range   WBC 11.2 (H) 4.0 - 10.5 K/uL   RBC 3.50 (L) 3.87 - 5.11 MIL/uL   Hemoglobin 10.3 (L) 12.0 - 15.0 g/dL   HCT 31.5 (L) 36.0 - 46.0 %   MCV 90.0 80.0 - 100.0 fL   MCH 29.4 26.0 - 34.0 pg   MCHC 32.7 30.0 - 36.0 g/dL   RDW 13.5 11.5 - 15.5 %   Platelets 213 150 - 400 K/uL   nRBC 0.0 0.0 - 0.2 %   Neutrophils Relative % 78 %   Neutro Abs 8.7 (H) 1.7 - 7.7 K/uL   Lymphocytes Relative 14 %   Lymphs Abs 1.5 0.7 - 4.0 K/uL   Monocytes Relative 7 %   Monocytes Absolute 0.8 0.1 - 1.0 K/uL   Eosinophils Relative 1 %   Eosinophils Absolute 0.1 0.0 - 0.5 K/uL   Basophils Relative 0 %   Basophils Absolute 0.0 0.0 - 0.1 K/uL   Immature Granulocytes 0 %   Abs Immature Granulocytes 0.04 0.00 - 0.07 K/uL    Comment: Performed at [facility], [address] [place], [place] [address]  Comprehensive metabolic panel     Status: Abnormal   Collection Time: 08/08/21  6:58 PM  Result Value Ref Range   Sodium 138 135 - 145 mmol/L   Potassium 3.6 3.5 - 5.1 mmol/L   Chloride 103 98 - 111 mmol/L   CO2 27 22 - 32 mmol/L   Glucose, Bld 128 (H) 70 - 99 mg/dL    Comment: Glucose reference range applies only to samples taken after fasting for at least 8 hours.   BUN 28 (H) 8 - 23 mg/dL   Creatinine, Ser 1.10 (H) 0.44 - 1.00 mg/dL   Calcium 9.4 8.9 - 10.3 mg/dL   Total Protein 6.2 (L) 6.5 - 8.1 g/dL   Albumin 3.6 3.5 - 5.0 g/dL   AST 18 15 - 41 U/L   ALT 17 0 - 44 U/L   Alkaline Phosphatase 83 38 - 126 U/L   Total Bilirubin 0.7 0.3 - 1.2 mg/dL   GFR, Estimated 50 (L) >60 mL/min    Comment: (NOTE) Calculated using the CKD-EPI Creatinine Equation (2021)    Anion gap 8 5 - 15    Comment: Performed at [facility], [address] [place], [place] [address]  Lipase,  blood     Status: None   Collection Time: 08/08/21  6:58 PM  Result Value Ref Range   Lipase 26 11 - 51 U/L    Comment: Performed at [facility], [address], [place], [place] [address]  ABO/Rh     Status: None   Collection Time: 08/08/21  6:58 PM  Result Value Ref Range   ABO/RH(D)      Jenetta Downer POS Performed at [facility], [address] [place], [place] [address]   Prepare RBC (crossmatch)     Status: None   Collection Time: 08/08/21  9:04 PM  Result Value Ref Range   Order Confirmation      ORDER PROCESSED BY BLOOD BANK Performed at [facility], [address] [place],  [address]   Type and screen [facility]     Status: None (Preliminary result)   Collection Time: 08/08/21  9:08 PM  Result Value Ref Range   ABO/RH(D) O POS    Antibody Screen NEG    Sample Expiration      08/11/2021,2359 Performed at [facility], [address] [place], [place] [address]    Unit Number [id number]    Blood Component Type RED CELLS,LR    Unit division 00    Status of Unit ALLOCATED    Transfusion Status OK TO TRANSFUSE    Crossmatch Result Compatible    Personally reviewed- small bowel with air and fluid and mesenteric edema, no diverticulitis or gastric thickening noted  CT ABDOMEN PELVIS W CONTRAST  Result Date: 08/08/2021 CLINICAL DATA:  Progressive abdominal pain EXAM: CT ABDOMEN AND PELVIS WITH CONTRAST TECHNIQUE: Multidetector CT imaging of the abdomen and pelvis was performed using the standard protocol following bolus administration of intravenous contrast. CONTRAST:  76mL OMNIPAQUE IOHEXOL 300 MG/ML  SOLN COMPARISON:  CT 07/18/2020 FINDINGS: Lower chest: Lung bases demonstrate no acute consolidation or effusion. Normal cardiac size. Small hiatal hernia Hepatobiliary: Status post cholecystectomy. Stable mild intra and extrahepatic biliary dilatation. Pancreas: No inflammatory change or ductal dilatation. Similar small cystic lesion at head of pancreas. Spleen: Stable  cyst in the spleen inferiorly. Adrenals/Urinary Tract: Adrenal glands are normal. Kidneys show no hydronephrosis. 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 Bladder unremarkable Stomach/Bowel: The stomach is nonenlarged. Multiple duodenal diverticulum. Diverticular disease of the colon. Mild wall thickening of left lower quadrant small bowel with inflammation in mesentery. Vascular/Lymphatic: Mild aortic atherosclerosis. No aneurysm. No suspicious nodes Reproductive: Status post hysterectomy. No adnexal masses. Other: Multiple foci of small amounts of pneumoperitoneum. Uncertain source. Questionable gas collection anterior to a small bowel loop, series 2, image 36, could consider perforated small bowel diverticulum. Appendix not confidently visualized. Musculoskeletal: Degenerative changes of the spine. No acute osseous abnormality. Skin thickening and infiltration of right abdominal subcutaneous fat. IMPRESSION: 1. Positive for multiple small foci of pneumoperitoneum, consistent with hollow viscus perforation. Source is unclear. Small linear gas collection adjacent to a loop of small bowel in the central abdomen, question site of perforation. Could consider perforated small bowel diverticulum. Thickened left lower quadrant small bowel with inflammation probably reactive. Small amounts of free fluid are present in the abdomen and pelvis. 2. Stable angiomyolipoma of the right kidney 3. Small cystic lesion of the pancreas head does not appear significantly changed in size Critical Value/emergent results were called by telephone at the time of interpretation on 08/08/2021 at 8:34 pm to provider [facility] , who verbally acknowledged these results. Electronically Signed   By: Donavan Foil M.D.   On: 08/08/2021 20:34     Assessment & Plan:  SREENIDHI GANSON is a 85 y.o. female with free air and concern for perforation of her small intestine. Discussed risk of bleeding, infection, needing ostomy, finding a cancer  and staying intubated or needing further ICU care. Discussed that [name] [name] will likely need rehab after surgery if her hospital stay is prolonged. Discussed likely NG after surgery.   Blood consent obtained NPO LR @ 100 ICU post op   All questions were answered to the satisfaction of the patient and family.   Virl Cagey 08/08/2021, 10:08 PM

## 2021-08-08 NOTE — ED Provider Notes (Signed)
[facility] EMERGENCY DEPARTMENT Provider Note   CSN: [id number] Arrival date & time: 08/08/21  1839     History Chief Complaint  Patient presents with   Abdominal Pain    [name] [name] is a 85 y.o. female.   Abdominal Pain  This patient is an 85 year old female, she has a history of prior abdominal surgery including a hysterectomy as well as a cholecystectomy and presents to the hospital after developing acute onset of abdominal pain which occurred this morning.  She reports that there is no nausea or vomiting and no diarrhea - she takes meds for diarrhea and has not had that or bloody stools - she feels distended and bloated today.  No f/c or edema.  No resp sx and no CP.  No meds given pta.  Sx are gradually worsening and now severe.  Past Medical History:  Diagnosis Date   Anemia    Anxiety    Arthritis    Chronic back pain    Chronic diarrhea    Diabetes ([place])    Diverticulosis    GERD (gastroesophageal reflux disease)    Hyperlipidemia    Hypertension    Internal hemorrhoids    UTI (urinary tract infection)     Patient Active Problem List   Diagnosis Date Noted   TIA (transient ischemic attack) 12/06/2020   Microalbuminuria 02/23/2019   Normocytic normochromic anemia 01/12/2018   Spondylolisthesis of lumbar region 12/08/2017   Hyperlipidemia 10/16/2017   GERD (gastroesophageal reflux disease) 10/16/2017   UTI (urinary tract infection) 10/16/2017   Hypokalemia 10/16/2017   Hypomagnesemia 41/32/4401   Acute metabolic encephalopathy 02/72/5366   Osteoporosis, postmenopausal 09/10/2017   Chronic diarrhea 08/26/2017   Essential hypertension 02/20/2016   Dyspnea 02/19/2016    Past Surgical History:  Procedure Laterality Date   ABDOMINAL HYSTERECTOMY     CARDIAC CATHETERIZATION     CATARACT EXTRACTION, BILATERAL     CHOLECYSTECTOMY     COLONOSCOPY  11/2015   Dr. Britta Mccreedy: hyperplastic polyp from sigmoid colon. biopsies from ascending colon were negative for  microscopic colitis.    COLONOSCOPY N/A 03/31/2018   Dr. Gala Romney: Diverticulosis, random colon biopsies negative, hemorrhoids.  No future screening or surveillance colonoscopy is recommended.   TONSILLECTOMY     TRIGGER FINGER RELEASE       OB History   No obstetric history on file.     Family History  Problem Relation Age of Onset   Heart attack Sister    Transient ischemic attack Sister    Heart attack Mother    Diabetes Mother    Transient ischemic attack Mother    Heart attack Father    Diabetes Son    Diabetes Maternal Aunt    Rectal cancer Other        anal CA   Colon cancer Neg Hx    Celiac disease Neg Hx    Inflammatory bowel disease Neg Hx    Stomach cancer Neg Hx     Social History   Tobacco Use   Smoking status: Never   Smokeless tobacco: Never  Vaping Use   Vaping Use: Never used  Substance Use Topics   Alcohol use: No    Alcohol/week: 0.0 standard drinks   Drug use: No    Home Medications Prior to Admission medications   Medication Sig Start Date End Date Taking? Authorizing Provider  ACCU-CHEK GUIDE test strip USE AS DIRECTED THREE TIMES DAILY 07/17/21   Elayne Snare, MD  atorvastatin (LIPITOR) 40 MG  tablet Take 20 mg by mouth daily.     [provider]  Calcium Citrate-Vitamin D 200-250 MG-UNIT TABS Take 1 tablet by mouth daily.    [provider]  colestipol (COLESTID) 1 g tablet Take 1 tablet (1 g total) by mouth 2 (two) times daily. 02/06/21   Levin Erp, PA  COMFORT EZ PEN NEEDLES 31G X 5 MM MISC  08/22/20   [provider]  Cyanocobalamin (B-12) 1000 MCG TABS Take 1,000 mcg by mouth daily.     [provider]  diclofenac sodium (VOLTAREN) 1 % GEL Apply 2 g topically at bedtime.    [provider]  donepezil (ARICEPT) 10 MG tablet Take 1 tablet (10 mg total) by mouth at bedtime. Start half a tablet daily into 1 month and then increase to 1 tablet daily 05/21/21   Garvin Fila, MD  DULoxetine  (CYMBALTA) 60 MG capsule Take 60 mg by mouth daily.    [provider]  enalapril (VASOTEC) 20 MG tablet Take 20 mg by mouth daily.    [provider]  famotidine (PEPCID) 20 MG tablet Take 40 mg by mouth daily. 11/20/20   [provider]  ferrous sulfate 325 (65 FE) MG EC tablet Take 325 mg by mouth daily with breakfast.    [provider]  labetalol (NORMODYNE) 200 MG tablet Take 200 mg by mouth 2 (two) times daily.    [provider]  Lactobacillus Rhamnosus, GG, (RA PROBIOTIC DIGESTIVE CARE PO) Take 1 capsule by mouth daily.    [provider]  levothyroxine (SYNTHROID) 25 MCG tablet Take 25 mcg by mouth daily before breakfast.    [provider]  magnesium oxide (MAG-OX) 400 MG tablet Take 400 mg by mouth daily.    [provider]  Multiple Vitamins-Minerals (CENTRUM) tablet Take 1 tablet by mouth daily.    [provider]  sertraline (ZOLOFT) 25 MG tablet Take 25 mg by mouth daily.    [provider]  [place] 100-33 UNT-MCG/ML SOPN INJECT 22 UNITS SQ DAILY BEFORE BREAKFast Patient taking differently: 18 Units. 02/10/21   Elayne Snare, MD    Allergies    Patient has no known allergies.  Review of Systems   Review of Systems  Gastrointestinal:  Positive for abdominal pain.  All other systems reviewed and are negative.  Physical Exam Updated Vital Signs BP (!) 220/89   Pulse 97   Temp 98.4 F (36.9 C) (Oral)   Resp 18   Ht 1.6 m (5\' 3" )   Wt 60.3 kg   SpO2 94%   BMI 23.56 kg/m   Physical Exam Vitals and nursing note reviewed.  Constitutional:      General: She is not in acute distress.    Appearance: She is well-developed.  HENT:     Head: Normocephalic and atraumatic.     Nose: No congestion or rhinorrhea.     Mouth/Throat:     Mouth: Mucous membranes are moist.     Pharynx: No oropharyngeal exudate.  Eyes:     General: No scleral icterus.       Right eye: No discharge.         Left eye: No discharge.     Conjunctiva/sclera: Conjunctivae normal.     Pupils: Pupils are equal, round, and reactive to light.  Neck:     Thyroid: No thyromegaly.     Vascular: No JVD.  Cardiovascular:     Rate and Rhythm: Normal rate  and regular rhythm.     Heart sounds: Normal heart sounds. No murmur heard.   No friction rub. No gallop.  Pulmonary:     Effort: Pulmonary effort is normal. No respiratory distress.     Breath sounds: Normal breath sounds. No wheezing or rales.  Abdominal:     General: Bowel sounds are normal. There is no distension.     Palpations: Abdomen is soft. There is no mass.     Tenderness: There is generalized abdominal tenderness.  Musculoskeletal:        General: No tenderness. Normal range of motion.     Cervical back: Normal range of motion and neck supple.     Right lower leg: No edema.     Left lower leg: No edema.  Lymphadenopathy:     Cervical: No cervical adenopathy.  Skin:    General: Skin is warm and dry.     Findings: No erythema or rash.  Neurological:     General: No focal deficit present.     Mental Status: She is alert. Mental status is at baseline.     Motor: No weakness.     Coordination: Coordination normal.  Psychiatric:        Behavior: Behavior normal.    ED Results / Procedures / Treatments   Labs (all labs ordered are listed, but only abnormal results are displayed) Labs Reviewed  CBC WITH DIFFERENTIAL/PLATELET - Abnormal; Notable for the following components:      Result Value   WBC 11.2 (*)    RBC 3.50 (*)    Hemoglobin 10.3 (*)    HCT 31.5 (*)    Neutro Abs 8.7 (*)    All other components within normal limits  COMPREHENSIVE METABOLIC PANEL - Abnormal; Notable for the following components:   Glucose, Bld 128 (*)    BUN 28 (*)    Creatinine, Ser 1.10 (*)    Total Protein 6.2 (*)    GFR, Estimated 50 (*)    All other components within normal limits  LIPASE, BLOOD  URINALYSIS, ROUTINE W REFLEX MICROSCOPIC  POC  SARS CORONAVIRUS 2 AG -  ED    EKG None  Radiology No results found.  Procedures .Critical Care Performed by: Noemi Chapel, MD Authorized by: Noemi Chapel, MD   Critical care provider statement:    Critical care time (minutes):  45   Critical care time was exclusive of:  Separately billable procedures and treating other patients   Critical care was necessary to treat or prevent imminent or life-threatening deterioration of the following conditions: perforated viscous.   Critical care was time spent personally by me on the following activities:  Development of treatment plan with patient or surrogate, discussions with consultants, evaluation of patient's response to treatment, examination of patient, obtaining history from patient or surrogate, review of old charts, re-evaluation of patient's condition, pulse oximetry, ordering and review of radiographic studies, ordering and review of laboratory studies and ordering and performing treatments and interventions   Care discussed with: admitting provider     Medications Ordered in ED Medications  0.9 %  sodium chloride infusion ( Intravenous New Bag/Given 08/08/21 1909)  piperacillin-tazobactam (ZOSYN) IVPB 3.375 g (has no administration in time range)  morphine 4 MG/ML injection 4 mg (4 mg Intravenous Given 08/08/21 1908)  ondansetron (ZOFRAN) injection 4 mg (4 mg Intravenous Given 08/08/21 1908)  iohexol (OMNIPAQUE) 300 MG/ML solution 100 mL (80 mLs Intravenous Contrast Given 08/08/21 2002)    ED Course  I have reviewed the triage vital signs and the nursing notes.  Pertinent labs & imaging results that were available during my care of the patient were reviewed by me and considered in my medical decision making (see chart for details).    MDM Rules/Calculators/A&P                           This patient is noted to be hypertensive however she is not tachycardic or febrile.  She has a diffusely tender abdomen but is  nonperitoneal and nonfocal.  She does have some tympanitic sounds to percussion but has normal bowel sounds.  We will proceed with CT scan and labs, patient agreeable, morphine ordered  Discussed case with radiology, they seen free air consistent with perforated viscus  Discussed case with general surgery, Dr. Constance Haw, she will come to see the patient in consultation for possible operation  Antibiotics added  Patient n.p.o., COVID test added  Final Clinical Impression(s) / ED Diagnoses Final diagnoses:  Perforated abdominal viscus     Noemi Chapel, MD 08/08/21 2036

## 2021-08-08 NOTE — ED Notes (Signed)
Dr bridges at bedside

## 2021-08-08 NOTE — ED Notes (Signed)
RN and an anesthesiologist at bedside

## 2021-08-09 ENCOUNTER — Encounter (HOSPITAL_COMMUNITY): Payer: Self-pay | Admitting: General Surgery

## 2021-08-09 DIAGNOSIS — R198 Other specified symptoms and signs involving the digestive system and abdomen: Secondary | ICD-10-CM | POA: Diagnosis present

## 2021-08-09 DIAGNOSIS — K529 Noninfective gastroenteritis and colitis, unspecified: Secondary | ICD-10-CM | POA: Diagnosis present

## 2021-08-09 DIAGNOSIS — N179 Acute kidney failure, unspecified: Secondary | ICD-10-CM | POA: Diagnosis not present

## 2021-08-09 DIAGNOSIS — I10 Essential (primary) hypertension: Secondary | ICD-10-CM | POA: Diagnosis present

## 2021-08-09 DIAGNOSIS — Z800 Family history of malignant neoplasm of digestive organs: Secondary | ICD-10-CM | POA: Diagnosis not present

## 2021-08-09 DIAGNOSIS — Z79890 Hormone replacement therapy (postmenopausal): Secondary | ICD-10-CM | POA: Diagnosis not present

## 2021-08-09 DIAGNOSIS — G8929 Other chronic pain: Secondary | ICD-10-CM | POA: Diagnosis present

## 2021-08-09 DIAGNOSIS — Z8249 Family history of ischemic heart disease and other diseases of the circulatory system: Secondary | ICD-10-CM | POA: Diagnosis not present

## 2021-08-09 DIAGNOSIS — M1990 Unspecified osteoarthritis, unspecified site: Secondary | ICD-10-CM | POA: Diagnosis present

## 2021-08-09 DIAGNOSIS — T184XXA Foreign body in colon, initial encounter: Secondary | ICD-10-CM | POA: Diagnosis present

## 2021-08-09 DIAGNOSIS — J9811 Atelectasis: Secondary | ICD-10-CM | POA: Diagnosis not present

## 2021-08-09 DIAGNOSIS — K631 Perforation of intestine (nontraumatic): Secondary | ICD-10-CM | POA: Diagnosis present

## 2021-08-09 DIAGNOSIS — M549 Dorsalgia, unspecified: Secondary | ICD-10-CM | POA: Diagnosis present

## 2021-08-09 DIAGNOSIS — E119 Type 2 diabetes mellitus without complications: Secondary | ICD-10-CM | POA: Diagnosis present

## 2021-08-09 DIAGNOSIS — Z833 Family history of diabetes mellitus: Secondary | ICD-10-CM | POA: Diagnosis not present

## 2021-08-09 DIAGNOSIS — E785 Hyperlipidemia, unspecified: Secondary | ICD-10-CM | POA: Diagnosis present

## 2021-08-09 DIAGNOSIS — Z20822 Contact with and (suspected) exposure to covid-19: Secondary | ICD-10-CM | POA: Diagnosis present

## 2021-08-09 DIAGNOSIS — R188 Other ascites: Secondary | ICD-10-CM | POA: Diagnosis present

## 2021-08-09 DIAGNOSIS — Z79899 Other long term (current) drug therapy: Secondary | ICD-10-CM | POA: Diagnosis not present

## 2021-08-09 LAB — URINALYSIS, ROUTINE W REFLEX MICROSCOPIC
Bacteria, UA: NONE SEEN
Bilirubin Urine: NEGATIVE
Glucose, UA: NEGATIVE mg/dL
Hgb urine dipstick: NEGATIVE
Ketones, ur: NEGATIVE mg/dL
Leukocytes,Ua: NEGATIVE
Nitrite: NEGATIVE
Protein, ur: 100 mg/dL — AB
Specific Gravity, Urine: 1.024 (ref 1.005–1.030)
pH: 5 (ref 5.0–8.0)

## 2021-08-09 LAB — CBC WITH DIFFERENTIAL/PLATELET
Abs Immature Granulocytes: 0.09 10*3/uL — ABNORMAL HIGH (ref 0.00–0.07)
Basophils Absolute: 0 10*3/uL (ref 0.0–0.1)
Basophils Relative: 0 %
Eosinophils Absolute: 0 10*3/uL (ref 0.0–0.5)
Eosinophils Relative: 0 %
HCT: 29 % — ABNORMAL LOW (ref 36.0–46.0)
Hemoglobin: 9.3 g/dL — ABNORMAL LOW (ref 12.0–15.0)
Immature Granulocytes: 1 %
Lymphocytes Relative: 4 %
Lymphs Abs: 0.6 10*3/uL — ABNORMAL LOW (ref 0.7–4.0)
MCH: 28.5 pg (ref 26.0–34.0)
MCHC: 32.1 g/dL (ref 30.0–36.0)
MCV: 89 fL (ref 80.0–100.0)
Monocytes Absolute: 0.5 10*3/uL (ref 0.1–1.0)
Monocytes Relative: 3 %
Neutro Abs: 13.1 10*3/uL — ABNORMAL HIGH (ref 1.7–7.7)
Neutrophils Relative %: 92 %
Platelets: 173 10*3/uL (ref 150–400)
RBC: 3.26 MIL/uL — ABNORMAL LOW (ref 3.87–5.11)
RDW: 13.6 % (ref 11.5–15.5)
WBC: 14.2 10*3/uL — ABNORMAL HIGH (ref 4.0–10.5)
nRBC: 0 % (ref 0.0–0.2)

## 2021-08-09 LAB — BASIC METABOLIC PANEL
Anion gap: 8 (ref 5–15)
BUN: 23 mg/dL (ref 8–23)
CO2: 25 mmol/L (ref 22–32)
Calcium: 8.6 mg/dL — ABNORMAL LOW (ref 8.9–10.3)
Chloride: 104 mmol/L (ref 98–111)
Creatinine, Ser: 1.09 mg/dL — ABNORMAL HIGH (ref 0.44–1.00)
GFR, Estimated: 50 mL/min — ABNORMAL LOW (ref >60)
Glucose, Bld: 171 mg/dL — ABNORMAL HIGH (ref 70–99)
Potassium: 4 mmol/L (ref 3.5–5.1)
Sodium: 137 mmol/L (ref 135–145)

## 2021-08-09 LAB — MRSA NEXT GEN BY PCR, NASAL: MRSA by PCR Next Gen: NOT DETECTED

## 2021-08-09 LAB — GLUCOSE, CAPILLARY
Glucose-Capillary: 76 mg/dL (ref 70–99)
Glucose-Capillary: 84 mg/dL (ref 70–99)
Glucose-Capillary: 85 mg/dL (ref 70–99)

## 2021-08-09 LAB — MAGNESIUM: Magnesium: 1.7 mg/dL (ref 1.7–2.4)

## 2021-08-09 LAB — PHOSPHORUS: Phosphorus: 4.3 mg/dL (ref 2.5–4.6)

## 2021-08-09 MED ORDER — DEXAMETHASONE SODIUM PHOSPHATE 10 MG/ML IJ SOLN
INTRAMUSCULAR | Status: DC | PRN
  Administered 2021-08-08: 5 mg via INTRAVENOUS

## 2021-08-09 MED ORDER — SUCCINYLCHOLINE CHLORIDE 200 MG/10ML IV SOSY
PREFILLED_SYRINGE | INTRAVENOUS | Status: DC | PRN
  Administered 2021-08-08: 100 mg via INTRAVENOUS

## 2021-08-09 MED ORDER — HEPARIN SODIUM (PORCINE) 5000 UNIT/ML IJ SOLN
5000.0000 [IU] | Freq: Three times a day (TID) | INTRAMUSCULAR | Status: DC
  Administered 2021-08-09 – 2021-08-10 (×4): 5000 [IU] via SUBCUTANEOUS
  Filled 2021-08-09 (×4): qty 1

## 2021-08-09 MED ORDER — ESMOLOL HCL 100 MG/10ML IV SOLN
INTRAVENOUS | Status: DC | PRN
  Administered 2021-08-08: 30 mg via INTRAVENOUS

## 2021-08-09 MED ORDER — LIDOCAINE HCL (CARDIAC) PF 100 MG/5ML IV SOSY
PREFILLED_SYRINGE | INTRAVENOUS | Status: DC | PRN
  Administered 2021-08-08: 60 mg via INTRATRACHEAL

## 2021-08-09 MED ORDER — PROPOFOL 10 MG/ML IV BOLUS
INTRAVENOUS | Status: DC | PRN
  Administered 2021-08-08: 80 mg via INTRAVENOUS

## 2021-08-09 MED ORDER — KETOROLAC TROMETHAMINE 15 MG/ML IJ SOLN
15.0000 mg | Freq: Four times a day (QID) | INTRAMUSCULAR | Status: DC | PRN
  Administered 2021-08-09: 15 mg via INTRAVENOUS
  Filled 2021-08-09: qty 1

## 2021-08-09 MED ORDER — METOPROLOL TARTRATE 5 MG/5ML IV SOLN
5.0000 mg | Freq: Four times a day (QID) | INTRAVENOUS | Status: DC | PRN
  Administered 2021-08-10 – 2021-08-11 (×2): 5 mg via INTRAVENOUS
  Filled 2021-08-09 (×2): qty 5

## 2021-08-09 MED ORDER — DIPHENHYDRAMINE HCL 12.5 MG/5ML PO ELIX: 12.5000 mg | ORAL_SOLUTION | Freq: Four times a day (QID) | ORAL | Status: DC | PRN

## 2021-08-09 MED ORDER — CHLORHEXIDINE GLUCONATE CLOTH 2 % EX PADS
6.0000 | MEDICATED_PAD | Freq: Every day | CUTANEOUS | Status: DC
  Administered 2021-08-09 – 2021-08-12 (×4): 6 via TOPICAL

## 2021-08-09 MED ORDER — PANTOPRAZOLE SODIUM 40 MG IV SOLR
40.0000 mg | Freq: Every day | INTRAVENOUS | Status: DC
  Administered 2021-08-09 – 2021-08-10 (×2): 40 mg via INTRAVENOUS
  Filled 2021-08-09 (×2): qty 40

## 2021-08-09 MED ORDER — MAGNESIUM SULFATE 2 GM/50ML IV SOLN
2.0000 g | Freq: Once | INTRAVENOUS | Status: AC
  Administered 2021-08-09: 2 g via INTRAVENOUS
  Filled 2021-08-09: qty 50

## 2021-08-09 MED ORDER — ONDANSETRON HCL 4 MG/2ML IJ SOLN
4.0000 mg | Freq: Four times a day (QID) | INTRAMUSCULAR | Status: DC | PRN
  Administered 2021-08-11: 4 mg via INTRAVENOUS
  Filled 2021-08-09: qty 2

## 2021-08-09 MED ORDER — DIPHENHYDRAMINE HCL 50 MG/ML IJ SOLN
12.5000 mg | Freq: Four times a day (QID) | INTRAMUSCULAR | Status: DC | PRN
  Administered 2021-08-10: 12.5 mg via INTRAVENOUS
  Filled 2021-08-09: qty 1

## 2021-08-09 MED ORDER — HYDROMORPHONE HCL 1 MG/ML IJ SOLN: 0.2500 mg | INTRAMUSCULAR | Status: DC | PRN

## 2021-08-09 MED ORDER — MORPHINE SULFATE (PF) 2 MG/ML IV SOLN
2.0000 mg | INTRAVENOUS | Status: DC | PRN
  Administered 2021-08-10 (×2): 2 mg via INTRAVENOUS
  Filled 2021-08-09 (×2): qty 1

## 2021-08-09 MED ORDER — LEVOTHYROXINE SODIUM 100 MCG/5ML IV SOLN: 12.5000 ug | Freq: Every day | INTRAVENOUS | Status: DC

## 2021-08-09 MED ORDER — INSULIN ASPART 100 UNIT/ML IJ SOLN
0.0000 [IU] | INTRAMUSCULAR | Status: DC
  Administered 2021-08-09 (×2): 5 [IU] via SUBCUTANEOUS

## 2021-08-09 MED ORDER — PHENYLEPHRINE HCL-NACL 20-0.9 MG/250ML-% IV SOLN
INTRAVENOUS | Status: DC | PRN
  Administered 2021-08-08: 15 ug/min via INTRAVENOUS

## 2021-08-09 MED ORDER — FENTANYL CITRATE (PF) 250 MCG/5ML IJ SOLN
INTRAMUSCULAR | Status: DC | PRN
  Administered 2021-08-08 (×5): 50 ug via INTRAVENOUS

## 2021-08-09 MED ORDER — ONDANSETRON 4 MG PO TBDP
4.0000 mg | ORAL_TABLET | Freq: Four times a day (QID) | ORAL | Status: DC | PRN
  Administered 2021-08-11: 4 mg via ORAL
  Filled 2021-08-09: qty 1

## 2021-08-09 MED ORDER — METOPROLOL TARTRATE 5 MG/5ML IV SOLN
5.0000 mg | Freq: Four times a day (QID) | INTRAVENOUS | Status: DC
  Administered 2021-08-09 – 2021-08-10 (×6): 5 mg via INTRAVENOUS
  Filled 2021-08-09 (×6): qty 5

## 2021-08-09 MED ORDER — SODIUM CHLORIDE 0.9 % IV SOLN
2.0000 g | Freq: Two times a day (BID) | INTRAVENOUS | Status: DC
  Administered 2021-08-10: 2 g via INTRAVENOUS
  Filled 2021-08-09 (×5): qty 2

## 2021-08-09 NOTE — Progress Notes (Signed)
[facility] Progress Note  1 Day Post-Op  Subjective: Doing well and awake this. AM says no pain. Foley with good urine output.   Objective: Vital signs in last 24 hours: Temp:  [98 F (36.7 C)-100.3 F (37.9 C)] 98 F (36.7 C) (11/11 1000) Pulse Rate:  [72-110] 74 (11/11 1000) Resp:  [13-28] 18 (11/11 1000) BP: (104-220)/(45-89) 129/63 (11/11 1000) SpO2:  [87 %-100 %] 98 % (11/11 1000) Weight:  [60.3 kg] 60.3 kg (11/10 1842)    Intake/Output from previous day: 11/10 0701 - 11/11 0700 In: 5063.3 [I.V.:3863.3; IV Piggyback:1200] Out: 725 [Urine:600; Blood:25] Intake/Output this shift: Total I/O In: 293.4 [I.V.:293.4] Out: -   General appearance: alert and no distress Resp: normal work of breathing GI: soft, non-distended, appropriately tender, honeycomb c/d/I with no erythema or drainage , binder in place but large   Lab Results:  Recent Labs    08/08/21 1858 08/09/21 0354  WBC 11.2* 14.2*  HGB 10.3* 9.3*  HCT 31.5* 29.0*  PLT 213 173   BMET Recent Labs    08/08/21 1858 08/09/21 0354  NA 138 137  K 3.6 4.0  CL 103 104  CO2 27 25  GLUCOSE 128* 171*  BUN 28* 23  CREATININE 1.10* 1.09*  CALCIUM 9.4 8.6*   PT/INR No results for input(s): LABPROT, INR in the last 72 hours.  Studies/Results: CT ABDOMEN PELVIS W CONTRAST  Result Date: 08/08/2021 CLINICAL DATA:  Progressive abdominal pain EXAM: CT ABDOMEN AND PELVIS WITH CONTRAST TECHNIQUE: Multidetector CT imaging of the abdomen and pelvis was performed using the standard protocol following bolus administration of intravenous contrast. CONTRAST:  63mL OMNIPAQUE IOHEXOL 300 MG/ML  SOLN COMPARISON:  CT 07/18/2020 FINDINGS: Lower chest: Lung bases demonstrate no acute consolidation or effusion. Normal cardiac size. Small hiatal hernia Hepatobiliary: Status post cholecystectomy. Stable mild intra and extrahepatic biliary dilatation. Pancreas: No inflammatory change or ductal dilatation. Similar  small cystic lesion at head of pancreas. Spleen: Stable cyst in the spleen inferiorly. Adrenals/Urinary Tract: Adrenal glands are normal. Kidneys show no hydronephrosis. 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 Bladder unremarkable Stomach/Bowel: The stomach is nonenlarged. Multiple duodenal diverticulum. Diverticular disease of the colon. Mild wall thickening of left lower quadrant small bowel with inflammation in mesentery. Vascular/Lymphatic: Mild aortic atherosclerosis. No aneurysm. No suspicious nodes Reproductive: Status post hysterectomy. No adnexal masses. Other: Multiple foci of small amounts of pneumoperitoneum. Uncertain source. Questionable gas collection anterior to a small bowel loop, series 2, image 36, could consider perforated small bowel diverticulum. Appendix not confidently visualized. Musculoskeletal: Degenerative changes of the spine. No acute osseous abnormality. Skin thickening and infiltration of right abdominal subcutaneous fat. IMPRESSION: 1. Positive for multiple small foci of pneumoperitoneum, consistent with hollow viscus perforation. Source is unclear. Small linear gas collection adjacent to a loop of small bowel in the central abdomen, question site of perforation. Could consider perforated small bowel diverticulum. Thickened left lower quadrant small bowel with inflammation probably reactive. Small amounts of free fluid are present in the abdomen and pelvis. 2. Stable angiomyolipoma of the right kidney 3. Small cystic lesion of the pancreas head does not appear significantly changed in size Critical Value/emergent results were called by telephone at the time of interpretation on 08/08/2021 at 8:34 pm to provider [facility] , who verbally acknowledged these results. Electronically Signed   By: Donavan Foil M.D.   On: 08/08/2021 20:34    Anti-infectives: Anti-infectives (From admission, onward)    Start  Dose/Rate Route Frequency Ordered Stop   08/10/21 1000   cefoTEtan (CEFOTAN) 2 g in sodium chloride 0.9 % 100 mL IVPB        2 g 200 mL/hr over 30 Minutes Intravenous Every 12 hours 08/09/21 0118 08/14/21 0959   08/08/21 2100  cefoTEtan (CEFOTAN) 2 g in sodium chloride 0.9 % 100 mL IVPB        2 g 200 mL/hr over 30 Minutes Intravenous On call to O.R. 08/08/21 2046 08/09/21 0116   08/08/21 2030  piperacillin-tazobactam (ZOSYN) IVPB 3.375 g        3.375 g 100 mL/hr over 30 Minutes Intravenous  Once 08/08/21 2029 08/08/21 2120       Assessment/Plan: Patient doing well s/p  Ex lap, removal foreign body and oversewing of perforation.  NPO, NG PRN Morphine and toradol IS, OOB Labetolol 200 BID replaced with lopressor 5 mg q 6 scheduled and lopressor 5 mg PRN for SBP > 180 HR and BP in good range Foley out, Mg replaced Labs in AM, leukocytosis post op, cefotetan post op for 4 days LR to 75 cc / hr  SCDs, heparin sq PT ordered Stepdown bed Home synthroid IV equivalent ordered to start in 3 days per the hold period protocol  Will update family    LOS: 0 days    Virl Cagey 08/09/2021

## 2021-08-09 NOTE — Progress Notes (Signed)
[facility]  Updated family. Foreign body, fish bone versus Plastic in the SB with small pin point perforation. Oversewn.   NPO NG Cefotetan to cover for 4 days given perforation Labs in AM LR @ 100 Lopressor 5 q6 scheduled, will increase her dose to be equivalent to her home tomorrow PRN lopressor for HTN Foley  Heparin sq Binder ICU overnight for monitoring given age  Curlene Labrum, MD [place] [address] [place], [place] [id number] [phone] (office)

## 2021-08-09 NOTE — Plan of Care (Signed)
Care plan reviewed.

## 2021-08-09 NOTE — Progress Notes (Signed)
[facility] Progress Note Patient Name: [name] [name] DOB: 11/08/1935 MRN: [id number]   Date of Service  08/09/2021  HPI/Events of Note  Patient admitted to the ICU s/p exploratory laparotomy due to free intra-abdominal air, a tiny small bowel perforation was discovered and over-sewn per surgery notes, she was extubated post-op but admitted to the ICU for overnight monitoring. She is on short course Cefotetan per surgery orders.  eICU Interventions  New Patient Evaluation.        [name] 08/09/2021, 1:27 AM

## 2021-08-09 NOTE — Addendum Note (Signed)
Addendum  created 08/09/21 4707 by Denese Killings, MD   Intraprocedure Meds edited

## 2021-08-09 NOTE — Transfer of Care (Signed)
Immediate Anesthesia Transfer of Care Note  Patient: [name] [name]  Procedure(s) Performed: EXPLORATORY LAPAROTOMY, possible open  Patient Location: PACU  Anesthesia Type:General  Level of Consciousness: awake and drowsy  Airway & Oxygen Therapy: Patient Spontanous Breathing and Patient connected to face mask oxygen  Post-op Assessment: Report given to RN and Post -op Vital signs reviewed and stable  Post vital signs: Reviewed and stable  Last Vitals:  Vitals Value Taken Time  BP 162/60 08/09/21 0000  Temp 99.2   Pulse 94 08/09/21 0001  Resp 17 08/09/21 0001  SpO2 100 % 08/09/21 0001  Vitals shown include unvalidated device data.  Last Pain:  Vitals:   08/08/21 1933  TempSrc:   PainSc: 3          Complications: No notable events documented.

## 2021-08-09 NOTE — Evaluation (Signed)
Physical Therapy Evaluation Patient Details Name: [name] [name] MRN: [id number] DOB: Mar 27, 1936 Today's Date: 08/09/2021  History of Present Illness  [name] [name] is a 85 y/o female s/p  Exploratory laparotomy, oversewn of small bowel perforation on 08/09/21 who lives at home with her husband and has a history of chronic diarrhea, GERD, HTN and diabetes. She was at home and started having abdominal pain that was generalized and started to ultimately radiate up to her upper abdomen. She had associated nausea but no vomiting. She says the pain was worse as the day progressed. She was originally hypertensive and was given medication in the ED. She takes medication for chronic diarrhea. She is not sure if she ever had any diverticulitis. She sees GI regularly.  She is here with her daughter today. She says her pain is getting worse.   Clinical Impression  Patient functioning near baseline for functional mobility and gait other than having difficulty follow directions for log rolling technique during bed mobility with fair carryover, able to ambulate in room/hallway without loss of balance, but tends to drift to the left possibly due to decreased proprioception in feet secondary to neuropathy per patient.  Patient tolerated sitting up in chair after therapy.  Patient will benefit from continued physical therapy in hospital and recommended venue below to increase strength, balance, endurance for safe ADLs and gait.          Recommendations for follow up therapy are one component of a multi-disciplinary discharge planning process, led by the attending physician.  Recommendations may be updated based on patient status, additional functional criteria and insurance authorization.  Follow Up Recommendations Home health PT    Assistance Recommended at Discharge PRN  Functional Status Assessment Patient has had a recent decline in their functional status and demonstrates the ability to make significant  improvements in function in a reasonable and predictable amount of time.  Equipment Recommendations  None recommended by PT    Recommendations for Other Services       Precautions / Restrictions Precautions Precautions: Fall Restrictions Weight Bearing Restrictions: No      Mobility  Bed Mobility Overal bed mobility: Needs Assistance Bed Mobility: Rolling;Sidelying to Sit;Sit to Sidelying Rolling: Modified independent (Device/Increase time) Sidelying to sit: Min guard;Min assist     Sit to sidelying: Min guard General bed mobility comments: requires repeated verbal/tactile cueing for completing log rolling technique with fair carryover    Transfers Overall transfer level: Needs assistance Equipment used: Rolling walker (2 wheels) Transfers: Sit to/from Stand;Bed to chair/wheelchair/BSC Sit to Stand: Supervision Stand pivot transfers: Supervision;Min guard         General transfer comment: increased time, labored movement    Ambulation/Gait Ambulation/Gait assistance: Min guard Gait Distance (Feet): 60 Feet Assistive device: Rolling walker (2 wheels) Gait Pattern/deviations: Decreased step length - right;Decreased step length - left;Decreased stride length;Drifts right/left Gait velocity: decreased     General Gait Details: slow slightly labored cadence with frequent drifting to the left and required increased time to make turns, no loss of balance and limited mostly due to fatigue  Stairs            Wheelchair Mobility    Modified Rankin (Stroke Patients Only)       Balance Overall balance assessment: Needs assistance Sitting-balance support: Feet supported;No upper extremity supported Sitting balance-Leahy Scale: Good Sitting balance - Comments: seated at EOB   Standing balance support: Reliant on assistive device for balance;During functional activity;Bilateral upper extremity supported Standing  balance-Leahy Scale: Fair Standing balance  comment: fair/good using RW                             Pertinent Vitals/Pain Pain Assessment: Faces Faces Pain Scale: Hurts a little bit Pain Location: bilateral hips when standing Pain Descriptors / Indicators: Sore;Discomfort Pain Intervention(s): Limited activity within patient's tolerance;Monitored during session;Repositioned    Home Living Family/patient expects to be discharged to:: Private residence Living Arrangements: Spouse/significant other Available Help at Discharge: Family;Available 24 hours/day Type of Home: House Home Access: Ramped entrance       Home Layout: One level Home Equipment: Curator (4 wheels);Grab bars - tub/shower;Grab bars - toilet;Cane - single point      Prior Function Prior Level of Function : Needs assist       Physical Assist : Mobility (physical) Mobility (physical): Transfers;Gait;Bed mobility   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, does not drive ADLs Comments: assisted by family     Hand Dominance   Dominant Hand: Right    Extremity/Trunk Assessment   Upper Extremity Assessment Upper Extremity Assessment: Overall WFL for tasks assessed    Lower Extremity Assessment Lower Extremity Assessment: Generalized weakness    Cervical / Trunk Assessment Cervical / Trunk Assessment: Normal  Communication   Communication: No difficulties  Cognition Arousal/Alertness: Awake/alert Behavior During Therapy: WFL for tasks assessed/performed Overall Cognitive Status: Within Functional Limits for tasks assessed                                          General Comments      Exercises     Assessment/Plan    PT Assessment Patient needs continued PT services  PT Problem List Decreased strength;Decreased activity tolerance;Decreased balance;Decreased mobility       PT Treatment Interventions DME instruction;Gait training;Stair  training;Functional mobility training;Therapeutic activities;Therapeutic exercise;Patient/family education;Balance training    PT Goals (Current goals can be found in the Care Plan section)  Acute Rehab PT Goals Patient Stated Goal: return home with family to assist PT Goal Formulation: With patient Time For Goal Achievement: 08/16/21 Potential to Achieve Goals: Good    Frequency Min 3X/week   Barriers to discharge        Co-evaluation               AM-PAC PT "6 Clicks" Mobility  Outcome Measure Help needed turning from your back to your side while in a flat bed without using bedrails?: None Help needed moving from lying on your back to sitting on the side of a flat bed without using bedrails?: A Little Help needed moving to and from a bed to a chair (including a wheelchair)?: A Little Help needed standing up from a chair using your arms (e.g., wheelchair or bedside chair)?: A Little Help needed to walk in hospital room?: A Little Help needed climbing 3-5 steps with a railing? : A Lot 6 Click Score: 18    End of Session   Activity Tolerance: Patient tolerated treatment well;Patient limited by fatigue Patient left: in chair;with call bell/phone within reach Nurse Communication: Mobility status PT Visit Diagnosis: Unsteadiness on feet (R26.81);Other abnormalities of gait and mobility (R26.89);Muscle weakness (generalized) (M62.81)    Time: 2119-4174 PT Time Calculation (min) (ACUTE ONLY): 26 min   Charges:   PT Evaluation $PT Eval Moderate Complexity:  1 Mod PT Treatments $Therapeutic Activity: 23-37 mins        12:10 PM, 08/09/21 Lonell Grandchild, MPT Physical Therapist with [facility] [phone] office [phone] mobile phone

## 2021-08-09 NOTE — Anesthesia Procedure Notes (Signed)
Procedure Name: Intubation Date/Time: 08/08/2021 10:46 PM Performed by: Denese Killings, MD Pre-anesthesia Checklist: Patient identified, Emergency Drugs available, Suction available and Patient being monitored Patient Re-evaluated:Patient Re-evaluated prior to induction Oxygen Delivery Method: Circle system utilized Preoxygenation: Pre-oxygenation with 100% oxygen Induction Type: IV induction and Rapid sequence Laryngoscope Size: Mac and 3 Grade View: Grade I Tube type: Oral Tube size: 7.0 mm Number of attempts: 1 Airway Equipment and Method: Stylet Placement Confirmation: ETT inserted through vocal cords under direct vision, positive ETCO2 and breath sounds checked- equal and bilateral Secured at: 21 cm Tube secured with: Tape Dental Injury: Teeth and Oropharynx as per pre-operative assessment

## 2021-08-09 NOTE — Anesthesia Postprocedure Evaluation (Signed)
Anesthesia Post Note  Patient: [name] [name]  Procedure(s) Performed: EXPLORATORY LAPAROTOMY,  OVERSEW PERFORATED SMALL BOWEL  Patient location during evaluation: PACU Anesthesia Type: General Level of consciousness: sedated and awake Pain management: pain level controlled Vital Signs Assessment: post-procedure vital signs reviewed and stable Respiratory status: spontaneous breathing, nonlabored ventilation, respiratory function stable and patient connected to face mask oxygen Cardiovascular status: blood pressure returned to baseline and stable Postop Assessment: no apparent nausea or vomiting Anesthetic complications: no   No notable events documented.   Last Vitals:  Vitals:   08/09/21 0000 08/09/21 0015  BP: (!) 154/63 (!) 177/64  Pulse: 94 94  Resp: (!) 22 (!) 28  Temp: 37.3 C   SpO2: 100% 100%    Last Pain:  Vitals:   08/09/21 0015  TempSrc:   PainSc: Asleep                 [name] C Waynette Towers

## 2021-08-09 NOTE — Progress Notes (Signed)
Changed patient from NRB mask to 3lpm cann spo2 98% nurse informed

## 2021-08-09 NOTE — Addendum Note (Signed)
Addendum  created 08/09/21 0037 by Denese Killings, MD   Intraprocedure Meds edited

## 2021-08-09 NOTE — Op Note (Signed)
[facility] Operative Note  08/09/21  Preoperative Diagnosis: Pneumoperitoneum    Postoperative Diagnosis: Small bowel perforation from foreign body    Procedure(s) Performed: Exploratory laparotomy, oversewn of small bowel perforation    Surgeon: Lanell Matar. Constance Haw, MD   Assistants: No qualified resident was available    Anesthesia: General endotracheal   Anesthesiologist: Denese Killings, MD    Specimens: Foreign body    Estimated Blood Loss: Minimal   Blood Replacement: None    Complications: None    Wound Class: Contaminated    Operative Indications: [name] [name] is an 85 yo with acute onset of pain and findings of free air on CT scan. The air was around the small bowel and no clear source of the perforation was noted on the CT scan. We discussed exploration and risk of bleeding, infection, finding cancer, needing a resection or ostomy, and extended ICU or hospital stay and potential need for rehab.   Findings: Foreign body 2 cm long, thinner than a needle protruding through jejunum, small bowel and large bowel diverticulum, minimal contamination       Procedure: The patient was taken to the operating room and placed supine. General endotracheal anesthesia was induced. Intravenous antibiotics were  administered per protocol.  A nasogastric tube positioned to decompress the stomach. A foley was placed. The abdomen was prepared and draped in the usual sterile fashion.   A lower midline incision was made and carried down to the fascia. The fascia was entered with care with scissors. On entry some murky ascites was noted. This was cultured.  Some minor omental attachments to the abdominal wall were taken down with a ligasure. A wound protector was placed. The small bowel was ran from the ligament of treitz to the terminal ileum. There were diverticula and some minor adhesions between the bowel loops noted. On the mesenteric boarder at one of the diverticula a  small pin point hole was noted with a foreign body piercing the bowel. When the bowel was squeezed air and succus came from the area. The foreign body was removed and was thinner than a needle and about 2cm in length, potentially a bone or a piece of plastic. No other signs of perforation were found. The stomach was normal and Ng was in good position. The colon was normal with diverticula. The small perforation was oversewn with 3-0 Lembert silk sutures. Irrigation was performed. The abdominal cavity was suctioned. Omentum was placed over the bowel. The abdomen was closed in the standard fashion with 0 PDS suture.   The subcutaneous tissue was irrigated and the skin was closed with staples. A honeycomb dressing was placed.   Final inspection revealed acceptable hemostasis. All counts were correct at the end of the case. The patient was awakened from anesthesia and extubated without complication.  The patient went to the PACU in stable condition.   [name] Labrum, MD [facility] [address] [place],  [address] [phone] (office)

## 2021-08-09 NOTE — Plan of Care (Signed)
  Problem: Acute Rehab PT Goals(only PT should resolve) Goal: Pt Will Go Supine/Side To Sit Outcome: Progressing Flowsheets (Taken 08/09/2021 1211) Pt will go Supine/Side to Sit: with supervision Goal: Patient Will Transfer Sit To/From Stand Outcome: Progressing Flowsheets (Taken 08/09/2021 1211) Patient will transfer sit to/from stand: with modified independence Goal: Pt Will Transfer Bed To Chair/Chair To Bed Outcome: Progressing Flowsheets (Taken 08/09/2021 1211) Pt will Transfer Bed to Chair/Chair to Bed: with modified independence Goal: Pt Will Ambulate Outcome: Progressing Flowsheets (Taken 08/09/2021 1211) Pt will Ambulate:  100 feet  with supervision  with rolling walker   12:14 PM, 08/09/21 Lonell Grandchild, MPT Physical Therapist with [facility] [phone] office [phone] mobile phone

## 2021-08-10 ENCOUNTER — Inpatient Hospital Stay (HOSPITAL_COMMUNITY): Payer: Medicare PPO

## 2021-08-10 DIAGNOSIS — N179 Acute kidney failure, unspecified: Secondary | ICD-10-CM

## 2021-08-10 LAB — CBC WITH DIFFERENTIAL/PLATELET
Abs Immature Granulocytes: 0.08 10*3/uL — ABNORMAL HIGH (ref 0.00–0.07)
Basophils Absolute: 0 10*3/uL (ref 0.0–0.1)
Basophils Relative: 0 %
Eosinophils Absolute: 0 10*3/uL (ref 0.0–0.5)
Eosinophils Relative: 0 %
HCT: 25.9 % — ABNORMAL LOW (ref 36.0–46.0)
Hemoglobin: 8.1 g/dL — ABNORMAL LOW (ref 12.0–15.0)
Immature Granulocytes: 1 %
Lymphocytes Relative: 9 %
Lymphs Abs: 1 10*3/uL (ref 0.7–4.0)
MCH: 28.4 pg (ref 26.0–34.0)
MCHC: 31.3 g/dL (ref 30.0–36.0)
MCV: 90.9 fL (ref 80.0–100.0)
Monocytes Absolute: 0.9 10*3/uL (ref 0.1–1.0)
Monocytes Relative: 8 %
Neutro Abs: 9.3 10*3/uL — ABNORMAL HIGH (ref 1.7–7.7)
Neutrophils Relative %: 82 %
Platelets: 177 10*3/uL (ref 150–400)
RBC: 2.85 MIL/uL — ABNORMAL LOW (ref 3.87–5.11)
RDW: 13.9 % (ref 11.5–15.5)
WBC: 11.3 10*3/uL — ABNORMAL HIGH (ref 4.0–10.5)
nRBC: 0 % (ref 0.0–0.2)

## 2021-08-10 LAB — BASIC METABOLIC PANEL
Anion gap: 7 (ref 5–15)
Anion gap: 6 (ref 5–15)
BUN: 36 mg/dL — ABNORMAL HIGH (ref 8–23)
BUN: 37 mg/dL — ABNORMAL HIGH (ref 8–23)
CO2: 28 mmol/L (ref 22–32)
CO2: 26 mmol/L (ref 22–32)
Calcium: 8.4 mg/dL — ABNORMAL LOW (ref 8.9–10.3)
Calcium: 8.3 mg/dL — ABNORMAL LOW (ref 8.9–10.3)
Chloride: 103 mmol/L (ref 98–111)
Chloride: 103 mmol/L (ref 98–111)
Creatinine, Ser: 1.51 mg/dL — ABNORMAL HIGH (ref 0.44–1.00)
Creatinine, Ser: 1.5 mg/dL — ABNORMAL HIGH (ref 0.44–1.00)
GFR, Estimated: 34 mL/min — ABNORMAL LOW (ref >60)
GFR, Estimated: 34 mL/min — ABNORMAL LOW (ref >60)
Glucose, Bld: 97 mg/dL (ref 70–99)
Glucose, Bld: 115 mg/dL — ABNORMAL HIGH (ref 70–99)
Potassium: 4.3 mmol/L (ref 3.5–5.1)
Potassium: 3.9 mmol/L (ref 3.5–5.1)
Sodium: 138 mmol/L (ref 135–145)
Sodium: 135 mmol/L (ref 135–145)

## 2021-08-10 LAB — GLUCOSE, CAPILLARY
Glucose-Capillary: 87 mg/dL (ref 70–99)
Glucose-Capillary: 83 mg/dL (ref 70–99)
Glucose-Capillary: 82 mg/dL (ref 70–99)
Glucose-Capillary: 91 mg/dL (ref 70–99)
Glucose-Capillary: 68 mg/dL — ABNORMAL LOW (ref 70–99)

## 2021-08-10 LAB — SODIUM, URINE, RANDOM: Sodium, Ur: 66 mmol/L

## 2021-08-10 LAB — MAGNESIUM: Magnesium: 2.5 mg/dL — ABNORMAL HIGH (ref 1.7–2.4)

## 2021-08-10 LAB — CREATININE, URINE, RANDOM: Creatinine, Urine: 96.78 mg/dL

## 2021-08-10 LAB — PHOSPHORUS: Phosphorus: 4.9 mg/dL — ABNORMAL HIGH (ref 2.5–4.6)

## 2021-08-10 MED ORDER — HYDRALAZINE HCL 20 MG/ML IJ SOLN
5.0000 mg | Freq: Four times a day (QID) | INTRAMUSCULAR | Status: DC | PRN
  Administered 2021-08-10 – 2021-08-11 (×2): 5 mg via INTRAVENOUS
  Filled 2021-08-10 (×2): qty 1

## 2021-08-10 MED ORDER — LACTATED RINGERS IV BOLUS
250.0000 mL | Freq: Once | INTRAVENOUS | Status: AC
  Administered 2021-08-10: 250 mL via INTRAVENOUS

## 2021-08-10 MED ORDER — DICLOFENAC SODIUM 1 % EX GEL
2.0000 g | Freq: Four times a day (QID) | CUTANEOUS | Status: DC | PRN
  Administered 2021-08-10: 2 g via TOPICAL
  Filled 2021-08-10 (×2): qty 100

## 2021-08-10 MED ORDER — SODIUM CHLORIDE 0.9 % IV SOLN
2.0000 g | INTRAVENOUS | Status: AC
  Administered 2021-08-11 – 2021-08-13 (×3): 2 g via INTRAVENOUS
  Filled 2021-08-10 (×4): qty 2

## 2021-08-10 MED ORDER — LABETALOL HCL 200 MG PO TABS
200.0000 mg | ORAL_TABLET | Freq: Two times a day (BID) | ORAL | Status: DC
  Administered 2021-08-10 – 2021-08-14 (×9): 200 mg via ORAL
  Filled 2021-08-10 (×9): qty 1

## 2021-08-10 MED ORDER — ACETAMINOPHEN 500 MG PO TABS
1000.0000 mg | ORAL_TABLET | Freq: Four times a day (QID) | ORAL | Status: DC | PRN
  Administered 2021-08-10 – 2021-08-13 (×4): 1000 mg via ORAL
  Filled 2021-08-10 (×4): qty 2

## 2021-08-10 MED ORDER — LEVOTHYROXINE SODIUM 25 MCG PO TABS
25.0000 ug | ORAL_TABLET | Freq: Every day | ORAL | Status: DC
  Administered 2021-08-11 – 2021-08-14 (×4): 25 ug via ORAL
  Filled 2021-08-10 (×5): qty 1

## 2021-08-10 MED ORDER — LABETALOL HCL 200 MG PO TABS
200.0000 mg | ORAL_TABLET | Freq: Two times a day (BID) | ORAL | Status: DC
Start: 2021-08-10 — End: 2021-08-10

## 2021-08-10 NOTE — Progress Notes (Signed)
[facility] Progress Note  2 Days Post-Op  Subjective: Up ambulating. Has had minimal NG output. Starting to have flatus. UOP lower overnight but voided this AM.   Objective: Vital signs in last 24 hours: Temp:  [97.8 F (36.6 C)-99.3 F (37.4 C)] 98.8 F (37.1 C) (11/12 1113) Pulse Rate:  [58-86] 58 (11/12 1241) Resp:  [10-22] 15 (11/12 1241) BP: (125-204)/(46-73) 186/52 (11/12 1241) SpO2:  [91 %-100 %] 100 % (11/12 1241)    Intake/Output from previous day: 11/11 0701 - 11/12 0700 In: 2019.3 [I.V.:1969.3; IV Piggyback:50] Out: 650 [Urine:400; Emesis/NG output:250] Intake/Output this shift: Total I/O In: 100 [IV Piggyback:100] Out: 275 [Urine:275]  General appearance: alert and no distress Resp: normal work of breathing GI: soft,nondistended, nontender  Lab Results:  Recent Labs    08/09/21 0354 08/10/21 0600  WBC 14.2* 11.3*  HGB 9.3* 8.1*  HCT 29.0* 25.9*  PLT 173 177   BMET Recent Labs    08/09/21 0354 08/10/21 0600  NA 137 138  K 4.0 4.3  CL 104 103  CO2 25 28  GLUCOSE 171* 97  BUN 23 36*  CREATININE 1.09* 1.51*  CALCIUM 8.6* 8.4*   PT/INR No results for input(s): LABPROT, INR in the last 72 hours.  Studies/Results: CT ABDOMEN PELVIS W CONTRAST  Result Date: 08/08/2021 CLINICAL DATA:  Progressive abdominal pain EXAM: CT ABDOMEN AND PELVIS WITH CONTRAST TECHNIQUE: Multidetector CT imaging of the abdomen and pelvis was performed using the standard protocol following bolus administration of intravenous contrast. CONTRAST:  15mL OMNIPAQUE IOHEXOL 300 MG/ML  SOLN COMPARISON:  CT 07/18/2020 FINDINGS: Lower chest: Lung bases demonstrate no acute consolidation or effusion. Normal cardiac size. Small hiatal hernia Hepatobiliary: Status post cholecystectomy. Stable mild intra and extrahepatic biliary dilatation. Pancreas: No inflammatory change or ductal dilatation. Similar small cystic lesion at head of pancreas. Spleen: Stable cyst in the  spleen inferiorly. Adrenals/Urinary Tract: Adrenal glands are normal. Kidneys show no hydronephrosis. 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 Bladder unremarkable Stomach/Bowel: The stomach is nonenlarged. Multiple duodenal diverticulum. Diverticular disease of the colon. Mild wall thickening of left lower quadrant small bowel with inflammation in mesentery. Vascular/Lymphatic: Mild aortic atherosclerosis. No aneurysm. No suspicious nodes Reproductive: Status post hysterectomy. No adnexal masses. Other: Multiple foci of small amounts of pneumoperitoneum. Uncertain source. Questionable gas collection anterior to a small bowel loop, series 2, image 36, could consider perforated small bowel diverticulum. Appendix not confidently visualized. Musculoskeletal: Degenerative changes of the spine. No acute osseous abnormality. Skin thickening and infiltration of right abdominal subcutaneous fat. IMPRESSION: 1. Positive for multiple small foci of pneumoperitoneum, consistent with hollow viscus perforation. Source is unclear. Small linear gas collection adjacent to a loop of small bowel in the central abdomen, question site of perforation. Could consider perforated small bowel diverticulum. Thickened left lower quadrant small bowel with inflammation probably reactive. Small amounts of free fluid are present in the abdomen and pelvis. 2. Stable angiomyolipoma of the right kidney 3. Small cystic lesion of the pancreas head does not appear significantly changed in size Critical Value/emergent results were called by telephone at the time of interpretation on 08/08/2021 at 8:34 pm to provider [facility] , who verbally acknowledged these results. Electronically Signed   By: Donavan Foil M.D.   On: 08/08/2021 20:34    Anti-infectives: Anti-infectives (From admission, onward)    Start     Dose/Rate Route Frequency Ordered Stop   08/11/21 0845  cefoTEtan (CEFOTAN) 2 g in sodium chloride 0.9 %  100 mL IVPB        2  g 200 mL/hr over 30 Minutes Intravenous Every 24 hours 08/10/21 1121 08/14/21 0844   08/10/21 1000  cefoTEtan (CEFOTAN) 2 g in sodium chloride 0.9 % 100 mL IVPB  Status:  Discontinued        2 g 200 mL/hr over 30 Minutes Intravenous Every 12 hours 08/09/21 0118 08/10/21 1121   08/08/21 2100  cefoTEtan (CEFOTAN) 2 g in sodium chloride 0.9 % 100 mL IVPB        2 g 200 mL/hr over 30 Minutes Intravenous On call to O.R. 08/08/21 2046 08/09/21 0116   08/08/21 2030  piperacillin-tazobactam (ZOSYN) IVPB 3.375 g        3.375 g 100 mL/hr over 30 Minutes Intravenous  Once 08/08/21 2029 08/08/21 2120       Assessment/Plan: Patient doing well s/p  Ex lap, removal foreign body and oversewing of perforation.  NPO, NG Can have oral tylenol and hold suction  PRN Morphine  IS, OOB Labetolol 200 BID replaced with lopressor 5 mg q 6 scheduled and lopressor 5 mg PRN for SBP > 180 LR 250cc bolus for lower UOP and Cr up  Labs in AM, cefotetan post op for 4 days LR to 75 cc / hr  SCDs, heparin sq stopped given drift in H&H  Toradol stopped given Cr trending up PT ordered Stepdown bed today, hopefully transfer tomorrow  Home synthroid IV equivalent ordered to start in 3 days per the hold period protocol  Updated family    LOS: 1 day    Virl Cagey 08/10/2021

## 2021-08-10 NOTE — Progress Notes (Signed)
Fena calculated 0.8, prerenal.  Low volume. Will give another 250cc bolus now.  Curlene Labrum, MD

## 2021-08-10 NOTE — Progress Notes (Addendum)
[facility]  UOP Low and desaturation. Ordered CXR, repeat BMP and urine lytes.  CXR looks like atelectasis and not fluid overload, BMP with continued elevated Cr and BUN. Awaiting urine lytes but think she is dry.   Had small BM and voided now 300cc after 250cc bolus. Will see what urine lytes show and likely give more fluids.  Can get NG out and continue sips with meds and ice for now.  Will change back some home meds. Curlene Labrum, MD [facility] [address] [place], [place] [address] [phone] (office)

## 2021-08-10 NOTE — Progress Notes (Signed)
Night shift RN reports patient has not had any Urine output overnight. Per report, patient stated she did not "feel" like she had to urinate. Bladder Scanning at this time

## 2021-08-11 LAB — CBC WITH DIFFERENTIAL/PLATELET
Abs Immature Granulocytes: 0.03 10*3/uL (ref 0.00–0.07)
Basophils Absolute: 0 10*3/uL (ref 0.0–0.1)
Basophils Relative: 0 %
Eosinophils Absolute: 0.1 10*3/uL (ref 0.0–0.5)
Eosinophils Relative: 1 %
HCT: 24.7 % — ABNORMAL LOW (ref 36.0–46.0)
Hemoglobin: 7.9 g/dL — ABNORMAL LOW (ref 12.0–15.0)
Immature Granulocytes: 0 %
Lymphocytes Relative: 11 %
Lymphs Abs: 1 10*3/uL (ref 0.7–4.0)
MCH: 29.6 pg (ref 26.0–34.0)
MCHC: 32 g/dL (ref 30.0–36.0)
MCV: 92.5 fL (ref 80.0–100.0)
Monocytes Absolute: 0.9 10*3/uL (ref 0.1–1.0)
Monocytes Relative: 10 %
Neutro Abs: 6.8 10*3/uL (ref 1.7–7.7)
Neutrophils Relative %: 78 %
Platelets: 179 10*3/uL (ref 150–400)
RBC: 2.67 MIL/uL — ABNORMAL LOW (ref 3.87–5.11)
RDW: 14 % (ref 11.5–15.5)
WBC: 8.8 10*3/uL (ref 4.0–10.5)
nRBC: 0 % (ref 0.0–0.2)

## 2021-08-11 LAB — BASIC METABOLIC PANEL
Anion gap: 6 (ref 5–15)
BUN: 35 mg/dL — ABNORMAL HIGH (ref 8–23)
CO2: 27 mmol/L (ref 22–32)
Calcium: 8.1 mg/dL — ABNORMAL LOW (ref 8.9–10.3)
Chloride: 103 mmol/L (ref 98–111)
Creatinine, Ser: 1.27 mg/dL — ABNORMAL HIGH (ref 0.44–1.00)
GFR, Estimated: 42 mL/min — ABNORMAL LOW (ref >60)
Glucose, Bld: 110 mg/dL — ABNORMAL HIGH (ref 70–99)
Potassium: 4 mmol/L (ref 3.5–5.1)
Sodium: 136 mmol/L (ref 135–145)

## 2021-08-11 LAB — GLUCOSE, CAPILLARY
Glucose-Capillary: 100 mg/dL — ABNORMAL HIGH (ref 70–99)
Glucose-Capillary: 104 mg/dL — ABNORMAL HIGH (ref 70–99)
Glucose-Capillary: 118 mg/dL — ABNORMAL HIGH (ref 70–99)
Glucose-Capillary: 121 mg/dL — ABNORMAL HIGH (ref 70–99)
Glucose-Capillary: 167 mg/dL — ABNORMAL HIGH (ref 70–99)
Glucose-Capillary: 224 mg/dL — ABNORMAL HIGH (ref 70–99)

## 2021-08-11 LAB — MAGNESIUM: Magnesium: 2.1 mg/dL (ref 1.7–2.4)

## 2021-08-11 LAB — PHOSPHORUS: Phosphorus: 3.5 mg/dL (ref 2.5–4.6)

## 2021-08-11 MED ORDER — OXYCODONE HCL 5 MG PO TABS
5.0000 mg | ORAL_TABLET | ORAL | Status: DC | PRN
  Administered 2021-08-11 – 2021-08-12 (×2): 5 mg via ORAL
  Filled 2021-08-11 (×2): qty 1

## 2021-08-11 MED ORDER — INSULIN ASPART 100 UNIT/ML IJ SOLN
0.0000 [IU] | Freq: Three times a day (TID) | INTRAMUSCULAR | Status: DC
  Administered 2021-08-11 – 2021-08-12 (×2): 5 [IU] via SUBCUTANEOUS
  Administered 2021-08-12 (×2): 3 [IU] via SUBCUTANEOUS
  Administered 2021-08-13: 8 [IU] via SUBCUTANEOUS
  Administered 2021-08-13 (×2): 3 [IU] via SUBCUTANEOUS
  Administered 2021-08-14: 2 [IU] via SUBCUTANEOUS
  Administered 2021-08-14: 5 [IU] via SUBCUTANEOUS

## 2021-08-11 MED ORDER — ATORVASTATIN CALCIUM 20 MG PO TABS
20.0000 mg | ORAL_TABLET | Freq: Every day | ORAL | Status: DC
  Administered 2021-08-11 – 2021-08-14 (×4): 20 mg via ORAL
  Filled 2021-08-11 (×4): qty 1

## 2021-08-11 MED ORDER — DULOXETINE HCL 60 MG PO CPEP
60.0000 mg | ORAL_CAPSULE | Freq: Every day | ORAL | Status: DC
  Administered 2021-08-11 – 2021-08-14 (×4): 60 mg via ORAL
  Filled 2021-08-11 (×4): qty 1

## 2021-08-11 MED ORDER — FAMOTIDINE 20 MG PO TABS
20.0000 mg | ORAL_TABLET | Freq: Every day | ORAL | Status: DC
  Administered 2021-08-11 – 2021-08-14 (×4): 20 mg via ORAL
  Filled 2021-08-11 (×4): qty 1

## 2021-08-11 MED ORDER — DONEPEZIL HCL 5 MG PO TABS
10.0000 mg | ORAL_TABLET | Freq: Every day | ORAL | Status: DC
  Administered 2021-08-11 – 2021-08-13 (×3): 10 mg via ORAL
  Filled 2021-08-11 (×3): qty 2

## 2021-08-11 MED ORDER — SERTRALINE HCL 50 MG PO TABS
25.0000 mg | ORAL_TABLET | Freq: Every day | ORAL | Status: DC
  Administered 2021-08-11 – 2021-08-14 (×4): 25 mg via ORAL
  Filled 2021-08-11 (×4): qty 1

## 2021-08-11 MED ORDER — MORPHINE SULFATE (PF) 2 MG/ML IV SOLN: 2.0000 mg | INTRAVENOUS | Status: DC | PRN

## 2021-08-11 NOTE — Progress Notes (Signed)
O2 removed from patient as she doesn't wear any at all. Pt was on 3L. Pt is doing well on room air as saturations are in the mid 90 percentile range. Pt up in the chair, encouraging incentive spirometer as often as she can tolerate. Will continue to monitor.

## 2021-08-11 NOTE — Progress Notes (Signed)
[facility] Progress Note  3 Days Post-Op  Subjective: UOP improved and Cr improved after bolus yesterday. Fena was prerenal. She is third spacing some but needed the intravascular volume. No nausea. Tolerated liquids. Wants more to eat. Had BM yesterday. Having flatus.   Objective: Vital signs in last 24 hours: Temp:  [97.4 F (36.3 C)-99 F (37.2 C)] 98.4 F (36.9 C) (11/13 1133) Pulse Rate:  [58-102] 69 (11/13 1206) Resp:  [12-24] 17 (11/13 1206) BP: (138-210)/(47-128) 182/67 (11/13 1206) SpO2:  [97 %-100 %] 99 % (11/13 1206) Last BM Date: 08/10/21  Intake/Output from previous day: 11/12 0701 - 11/13 0700 In: 1752.9 [I.V.:1381; IV Piggyback:371.9] Out: 2150 [Urine:1475; Emesis/NG output:675] Intake/Output this shift: Total I/O In: 300 [P.O.:300] Out: -   General appearance: alert and no distress Resp: normal work of breathing GI: soft, nondistended, nontender, honeycomb dressing in place, no erythema or drainage  Lab Results:  Recent Labs    08/10/21 0600 08/11/21 0540  WBC 11.3* 8.8  HGB 8.1* 7.9*  HCT 25.9* 24.7*  PLT 177 179   BMET Recent Labs    08/10/21 1428 08/11/21 0540  NA 135 136  K 3.9 4.0  CL 103 103  CO2 26 27  GLUCOSE 115* 110*  BUN 37* 35*  CREATININE 1.50* 1.27*  CALCIUM 8.3* 8.1*   PT/INR No results for input(s): LABPROT, INR in the last 72 hours.  Studies/Results: DG Chest Port 1 View  Result Date: 08/10/2021 CLINICAL DATA:  Increased shortness of breath. EXAM: PORTABLE CHEST 1 VIEW COMPARISON:  No recent chest imaging. Radiograph 10/15/2017 reviewed. Lung bases from abdominal CT 08/08/2021 FINDINGS: Enteric tube in place with tip below the diaphragm not included in the field of view. Low lung volumes. Normal heart size for technique. Subsegmental opacities in the lung bases favor atelectasis. There may be a trace right pleural effusion. No pulmonary edema. No pneumothorax. Chronic changes of both shoulders.  Scoliosis and bony under mineralization. IMPRESSION: Low lung volumes with bibasilar atelectasis. Possible trace right pleural effusion. Electronically Signed   By: Keith Rake M.D.   On: 08/10/2021 15:07    Anti-infectives: Anti-infectives (From admission, onward)    Start     Dose/Rate Route Frequency Ordered Stop   08/11/21 0845  cefoTEtan (CEFOTAN) 2 g in sodium chloride 0.9 % 100 mL IVPB        2 g 200 mL/hr over 30 Minutes Intravenous Every 24 hours 08/10/21 1121 08/14/21 0844   08/10/21 1000  cefoTEtan (CEFOTAN) 2 g in sodium chloride 0.9 % 100 mL IVPB  Status:  Discontinued        2 g 200 mL/hr over 30 Minutes Intravenous Every 12 hours 08/09/21 0118 08/10/21 1121   08/08/21 2100  cefoTEtan (CEFOTAN) 2 g in sodium chloride 0.9 % 100 mL IVPB        2 g 200 mL/hr over 30 Minutes Intravenous On call to O.R. 08/08/21 2046 08/09/21 0116   08/08/21 2030  piperacillin-tazobactam (ZOSYN) IVPB 3.375 g        3.375 g 100 mL/hr over 30 Minutes Intravenous  Once 08/08/21 2029 08/08/21 2120       Assessment/Plan: Patient doing well s/p  Ex lap, removal foreign body and oversewing of perforation.   Full liquid diet now  Tylenol PRN Adding Roxicodone for moderate pain PRN Morphine for severe pain  IS, OOB Labetolol for HTN AM labs LR to 50 cc / hr  SCDs, heparin sq stopped given drift in H&H,  stabilizing now PT  Floor bed and dc cardiac monitor  Updated family    LOS: 2 days    Virl Cagey 08/11/2021

## 2021-08-11 NOTE — Progress Notes (Addendum)
2L nasal cannula re-applied to patient as when she sleeps and is sedentary she desat's to 88%. With oxygen applied, oxygen sat's are much better in the mid to high 90's.

## 2021-08-12 LAB — CBC WITH DIFFERENTIAL/PLATELET
Abs Immature Granulocytes: 0.03 10*3/uL (ref 0.00–0.07)
Basophils Absolute: 0 10*3/uL (ref 0.0–0.1)
Basophils Relative: 0 %
Eosinophils Absolute: 0.2 10*3/uL (ref 0.0–0.5)
Eosinophils Relative: 2 %
HCT: 23.9 % — ABNORMAL LOW (ref 36.0–46.0)
Hemoglobin: 7.4 g/dL — ABNORMAL LOW (ref 12.0–15.0)
Immature Granulocytes: 0 %
Lymphocytes Relative: 15 %
Lymphs Abs: 1.1 10*3/uL (ref 0.7–4.0)
MCH: 28.4 pg (ref 26.0–34.0)
MCHC: 31 g/dL (ref 30.0–36.0)
MCV: 91.6 fL (ref 80.0–100.0)
Monocytes Absolute: 1 10*3/uL (ref 0.1–1.0)
Monocytes Relative: 13 %
Neutro Abs: 5 10*3/uL (ref 1.7–7.7)
Neutrophils Relative %: 70 %
Platelets: 159 10*3/uL (ref 150–400)
RBC: 2.61 MIL/uL — ABNORMAL LOW (ref 3.87–5.11)
RDW: 13.4 % (ref 11.5–15.5)
WBC: 7.3 10*3/uL (ref 4.0–10.5)
nRBC: 0 % (ref 0.0–0.2)

## 2021-08-12 LAB — BASIC METABOLIC PANEL
Anion gap: 5 (ref 5–15)
BUN: 30 mg/dL — ABNORMAL HIGH (ref 8–23)
CO2: 28 mmol/L (ref 22–32)
Calcium: 8.2 mg/dL — ABNORMAL LOW (ref 8.9–10.3)
Chloride: 102 mmol/L (ref 98–111)
Creatinine, Ser: 1.2 mg/dL — ABNORMAL HIGH (ref 0.44–1.00)
GFR, Estimated: 45 mL/min — ABNORMAL LOW (ref >60)
Glucose, Bld: 170 mg/dL — ABNORMAL HIGH (ref 70–99)
Potassium: 4.2 mmol/L (ref 3.5–5.1)
Sodium: 135 mmol/L (ref 135–145)

## 2021-08-12 LAB — TYPE AND SCREEN
ABO/RH(D): O POS
Antibody Screen: NEGATIVE
Unit division: 0

## 2021-08-12 LAB — BPAM RBC
Blood Product Expiration Date: 202212122359
Unit Type and Rh: 5100

## 2021-08-12 LAB — GLUCOSE, CAPILLARY
Glucose-Capillary: 168 mg/dL — ABNORMAL HIGH (ref 70–99)
Glucose-Capillary: 195 mg/dL — ABNORMAL HIGH (ref 70–99)
Glucose-Capillary: 240 mg/dL — ABNORMAL HIGH (ref 70–99)
Glucose-Capillary: 267 mg/dL — ABNORMAL HIGH (ref 70–99)

## 2021-08-12 LAB — SURGICAL PATHOLOGY

## 2021-08-12 MED ORDER — DOCUSATE SODIUM 100 MG PO CAPS
100.0000 mg | ORAL_CAPSULE | Freq: Two times a day (BID) | ORAL | Status: DC
  Administered 2021-08-12 (×2): 100 mg via ORAL
  Filled 2021-08-12 (×3): qty 1

## 2021-08-12 MED ORDER — ENALAPRIL MALEATE 5 MG PO TABS
20.0000 mg | ORAL_TABLET | Freq: Every day | ORAL | Status: DC
  Administered 2021-08-12 – 2021-08-14 (×3): 20 mg via ORAL
  Filled 2021-08-12 (×3): qty 4

## 2021-08-12 NOTE — Progress Notes (Signed)
Physical Therapy Treatment Patient Details Name: [name] [name] MRN: [id number] DOB: 1936-02-02 Today's Date: 08/12/2021   History of Present Illness [name] [name] is a 85 y/o female s/p  Exploratory laparotomy, oversewn of small bowel perforation on 08/09/21 who lives at home with her husband and has a history of chronic diarrhea, GERD, HTN and diabetes. She was at home and started having abdominal pain that was generalized and started to ultimately radiate up to her upper abdomen. She had associated nausea but no vomiting. She says the pain was worse as the day progressed. She was originally hypertensive and was given medication in the ED. She takes medication for chronic diarrhea. She is not sure if she ever had any diverticulitis. She sees GI regularly.  She is here with her daughter today. She says her pain is getting worse.    PT Comments    Patient presents up in chair and agreeable for therapy.  Patient requires repeated verbal/tactile cueing to follow instructions with fair carryover, had difficulty completing sit to stands due to BLE weakness and limited to a few steps at bedside mostly due to fatigue and c/o generalized weakness.  Patient incontinent of stool during visit - nursing staff notified.  Patient requested to go back to bed after therapy.  Patient will benefit from continued physical therapy in hospital and recommended venue below to increase strength, balance, endurance for safe ADLs and gait.     Recommendations for follow up therapy are one component of a multi-disciplinary discharge planning process, led by the attending physician.  Recommendations may be updated based on patient status, additional functional criteria and insurance authorization.  Follow Up Recommendations  Skilled nursing-short term rehab (<3 hours/day)     Assistance Recommended at Discharge Intermittent Supervision/Assistance  Equipment Recommendations  None recommended by PT    Recommendations for Other  Services       Precautions / Restrictions Precautions Precautions: Fall Restrictions Weight Bearing Restrictions: No     Mobility  Bed Mobility Overal bed mobility: Needs Assistance Bed Mobility: Sit to Supine       Sit to supine: Mod assist;Min assist   General bed mobility comments: had difficulty moving BLE onto bed during sit to supine    Transfers Overall transfer level: Needs assistance Equipment used: Rolling walker (2 wheels)     Stand pivot transfers: Mod assist Squat pivot transfers: Mod assist       General transfer comment: increased time, labored movement    Ambulation/Gait Ambulation/Gait assistance: Mod assist Gait Distance (Feet): 6 Feet Assistive device: Rolling walker (2 wheels) Gait Pattern/deviations: Decreased step length - right;Decreased step length - left;Decreased stride length Gait velocity: decreased     General Gait Details: limited to a few slow labored unsteady steps at bedside due to c/o fatigue   Stairs             Wheelchair Mobility    Modified Rankin (Stroke Patients Only)       Balance Overall balance assessment: Needs assistance Sitting-balance support: Feet supported;No upper extremity supported Sitting balance-Leahy Scale: Good Sitting balance - Comments: seated at EOB   Standing balance support: Reliant on assistive device for balance;During functional activity;Bilateral upper extremity supported Standing balance-Leahy Scale: Poor Standing balance comment: fair/poor using RW                            Cognition Arousal/Alertness: Awake/alert Behavior During Therapy: WFL for tasks assessed/performed Overall Cognitive  Status: Within Functional Limits for tasks assessed                                          Exercises General Exercises - Lower Extremity Ankle Circles/Pumps: Seated;AROM;Strengthening;Both;10 reps Long Arc Quad: Seated;Strengthening;AROM;Both;10 reps Hip  Flexion/Marching: Seated;AROM;Strengthening;Both;10 reps    General Comments        Pertinent Vitals/Pain Pain Assessment: No/denies pain Pain Intervention(s): Limited activity within patient's tolerance;Monitored during session    Home Living                          Prior Function            PT Goals (current goals can now be found in the care plan section) Acute Rehab PT Goals Patient Stated Goal: return home with family to assist PT Goal Formulation: With patient Time For Goal Achievement: 08/23/21 Potential to Achieve Goals: Good Progress towards PT goals: Progressing toward goals    Frequency    Min 3X/week      PT Plan Discharge plan needs to be updated    Co-evaluation              AM-PAC PT "6 Clicks" Mobility   Outcome Measure  Help needed turning from your back to your side while in a flat bed without using bedrails?: A Little Help needed moving from lying on your back to sitting on the side of a flat bed without using bedrails?: A Little Help needed moving to and from a bed to a chair (including a wheelchair)?: A Lot Help needed standing up from a chair using your arms (e.g., wheelchair or bedside chair)?: A Lot Help needed to walk in hospital room?: A Lot Help needed climbing 3-5 steps with a railing? : A Lot 6 Click Score: 14    End of Session   Activity Tolerance: Patient tolerated treatment well;Patient limited by fatigue Patient left: in bed;with call bell/phone within reach;with family/visitor present Nurse Communication: Mobility status PT Visit Diagnosis: Unsteadiness on feet (R26.81);Other abnormalities of gait and mobility (R26.89);Muscle weakness (generalized) (M62.81)     Time: 2426-8341 PT Time Calculation (min) (ACUTE ONLY): 27 min  Charges:  $Therapeutic Exercise: 8-22 mins $Therapeutic Activity: 8-22 mins                     3:49 PM, 08/12/21 Lonell Grandchild, MPT Physical Therapist with [facility] [phone] office [phone] mobile phone

## 2021-08-12 NOTE — Progress Notes (Signed)
[facility] Progress Note  4 Days Post-Op  Subjective: Having flatus, no BM recorded.   Objective: Vital signs in last 24 hours: Temp:  [98.2 F (36.8 C)-99.4 F (37.4 C)] 98.3 F (36.8 C) (11/14 0935) Pulse Rate:  [62-87] 85 (11/14 0935) Resp:  [14-22] 19 (11/14 0453) BP: (160-209)/(48-128) 206/77 (11/14 0935) SpO2:  [89 %-100 %] 94 % (11/14 0935) Last BM Date: 08/10/21  Intake/Output from previous day: 11/13 0701 - 11/14 0700 In: 1866.3 [P.O.:500; I.V.:1266.3; IV Piggyback:100] Out: 600 [Urine:600] Intake/Output this shift: No intake/output data recorded.  General appearance: alert and no distress Resp: normal work of breathing GI: soft, nondistended, nontender, honeycomb c/d/I with no erythema or drainage  Lab Results:  Recent Labs    08/11/21 0540 08/12/21 0451  WBC 8.8 7.3  HGB 7.9* 7.4*  HCT 24.7* 23.9*  PLT 179 159   BMET Recent Labs    08/11/21 0540 08/12/21 0451  NA 136 135  K 4.0 4.2  CL 103 102  CO2 27 28  GLUCOSE 110* 170*  BUN 35* 30*  CREATININE 1.27* 1.20*  CALCIUM 8.1* 8.2*   PT/INR No results for input(s): LABPROT, INR in the last 72 hours.  Studies/Results: DG Chest Port 1 View  Result Date: 08/10/2021 CLINICAL DATA:  Increased shortness of breath. EXAM: PORTABLE CHEST 1 VIEW COMPARISON:  No recent chest imaging. Radiograph 10/15/2017 reviewed. Lung bases from abdominal CT 08/08/2021 FINDINGS: Enteric tube in place with tip below the diaphragm not included in the field of view. Low lung volumes. Normal heart size for technique. Subsegmental opacities in the lung bases favor atelectasis. There may be a trace right pleural effusion. No pulmonary edema. No pneumothorax. Chronic changes of both shoulders. Scoliosis and bony under mineralization. IMPRESSION: Low lung volumes with bibasilar atelectasis. Possible trace right pleural effusion. Electronically Signed   By: Keith Rake M.D.   On: 08/10/2021 15:07     Anti-infectives: Anti-infectives (From admission, onward)    Start     Dose/Rate Route Frequency Ordered Stop   08/11/21 0845  cefoTEtan (CEFOTAN) 2 g in sodium chloride 0.9 % 100 mL IVPB        2 g 200 mL/hr over 30 Minutes Intravenous Every 24 hours 08/10/21 1121 08/14/21 0844   08/10/21 1000  cefoTEtan (CEFOTAN) 2 g in sodium chloride 0.9 % 100 mL IVPB  Status:  Discontinued        2 g 200 mL/hr over 30 Minutes Intravenous Every 12 hours 08/09/21 0118 08/10/21 1121   08/08/21 2100  cefoTEtan (CEFOTAN) 2 g in sodium chloride 0.9 % 100 mL IVPB        2 g 200 mL/hr over 30 Minutes Intravenous On call to O.R. 08/08/21 2046 08/09/21 0116   08/08/21 2030  piperacillin-tazobactam (ZOSYN) IVPB 3.375 g        3.375 g 100 mL/hr over 30 Minutes Intravenous  Once 08/08/21 2029 08/08/21 2120       Assessment/Plan: Patient doing well s/p  Ex lap, removal foreign body and oversewing of perforation.    Soft diet PRN for pain IS, OOB Labetolol for HTN Adding back Enalapril for HTN Stop LR BMP in AM SCDs, heparin sq stopped given drift in H&H, stable now  PT  Updated family   LOS: 3 days    Virl Cagey 08/12/2021

## 2021-08-12 NOTE — TOC Initial Note (Signed)
Transition of Care [facility]) - Initial/Assessment Note    Patient Details  Name: [name] [name] MRN: [id number] Date of Birth: 1936/03/25  [facility] CM/SW Contact:    Ihor Gully, LCSW Phone Number: 08/12/2021, 4:49 PM  Clinical Narrative:                 Patient from home with husband. Admitted for perforated bowel. Recommended for SNF. Daughters discussed that patient will not want to go to SNF but needs to go to SNF. Uses a walker and a cane. Has wall railing in the home to assist with ambulation. Does not drive.  Vaccinated and boostered for COVID.  Expected Discharge Plan: Skilled Nursing Facility Barriers to Discharge: Continued Medical Work up   Patient Goals and CMS Choice        Expected Discharge Plan and Services Expected Discharge Plan: [place] arrangements for the past 2 months: Single Family Home                                      Prior Living Arrangements/Services Living arrangements for the past 2 months: Single Family Home Lives with:: Spouse Patient language and need for interpreter reviewed:: Yes        Need for Family Participation in Patient Care: Yes (Comment) Care giver support system in place?: Yes (comment) Current home services: DME Criminal Activity/Legal Involvement Pertinent to Current Situation/Hospitalization: No - Comment as needed  Activities of Daily Living Home Assistive Devices/Equipment: None ADL Screening (condition at time of admission) Patient's cognitive ability adequate to safely complete daily activities?: Yes Is the patient deaf or have difficulty hearing?: No Does the patient have difficulty seeing, even when wearing glasses/contacts?: No Does the patient have difficulty concentrating, remembering, or making decisions?: No Patient able to express need for assistance with ADLs?: Yes Does the patient have difficulty dressing or bathing?: No Independently performs ADLs?:  No Communication: Needs assistance Is this a change from baseline?: Pre-admission baseline Dressing (OT): Needs assistance Is this a change from baseline?: Pre-admission baseline Grooming: Needs assistance Is this a change from baseline?: Pre-admission baseline Feeding: Needs assistance Is this a change from baseline?: Pre-admission baseline Bathing: Needs assistance Is this a change from baseline?: Pre-admission baseline Toileting: Needs assistance Is this a change from baseline?: Pre-admission baseline In/Out Bed: Needs assistance Is this a change from baseline?: Pre-admission baseline Walks in Home: Needs assistance Is this a change from baseline?: Pre-admission baseline Does the patient have difficulty walking or climbing stairs?: No Weakness of Legs: Both Weakness of Arms/Hands: Both  Permission Sought/Granted Permission sought to share information with : Family Supports    Share Information with NAME: daughters, [name] and Sonia Baller           Emotional Assessment         Alcohol / Substance Use: Not Applicable Psych Involvement: No (comment)  Admission diagnosis:  Perforated abdominal viscus [R19.8] Perforated bowel ([place]) [K63.1] Patient Active Problem List   Diagnosis Date Noted   Perforated bowel ([place]) 08/09/2021   Perforated abdominal viscus    TIA (transient ischemic attack) 12/06/2020   Microalbuminuria 02/23/2019   Normocytic normochromic anemia 01/12/2018   Spondylolisthesis of lumbar region 12/08/2017   Hyperlipidemia 10/16/2017   GERD (gastroesophageal reflux disease) 10/16/2017   UTI (urinary tract infection) 10/16/2017   Hypokalemia 10/16/2017   Hypomagnesemia 10/16/2017  Acute metabolic encephalopathy 65/68/1275   Osteoporosis, postmenopausal 09/10/2017   Chronic diarrhea 08/26/2017   Essential hypertension 02/20/2016   Dyspnea 02/19/2016   PCP:  Caryl Bis, MD Pharmacy:   [place], [place] [address] [place] [address] Phone: [phone] Fax: [phone]  [facility] [place], [place] [address] [place] [address] Phone: [phone] Fax: [phone]     Social Determinants of Health (SDOH) Interventions    Readmission Risk Interventions No flowsheet data found.

## 2021-08-12 NOTE — Progress Notes (Signed)
   08/12/21 0935  Vitals  Temp 98.3 F (36.8 C)  Temp Source Oral  BP (!) 206/77  MAP (mmHg) 113  BP Method Automatic  Pulse Rate 85  MEWS COLOR  MEWS Score Color Yellow  Oxygen Therapy  SpO2 94 %  MEWS Score  MEWS Temp 0  MEWS Systolic 2  MEWS Pulse 0  MEWS RR 0  MEWS LOC 0  MEWS Score 2

## 2021-08-12 NOTE — NC FL2 (Signed)
[place] LEVEL OF CARE SCREENING TOOL     IDENTIFICATION  Patient Name: [name] [name] Birthdate: 1936/02/13 Sex: female Admission Date (Current Location): 08/08/2021  [facility] and [place] Number:  [employer] and Address:  [place] [address] [place]      Provider Number: [id number]  Attending Physician Name and Address:  Virl Cagey, MD  Relative Name and Phone Number:  Tamme, Mozingo (Spouse)   [phone]    Current Level of Care: Hospital Recommended Level of Care: [place] Prior Approval Number:    Date Approved/Denied: 08/12/21 PASRR Number: [id number]  Discharge Plan: SNF    Current Diagnoses: Patient Active Problem List   Diagnosis Date Noted   Perforated bowel ([place]) 08/09/2021   Perforated abdominal viscus    TIA (transient ischemic attack) 12/06/2020   Microalbuminuria 02/23/2019   Normocytic normochromic anemia 01/12/2018   Spondylolisthesis of lumbar region 12/08/2017   Hyperlipidemia 10/16/2017   GERD (gastroesophageal reflux disease) 10/16/2017   UTI (urinary tract infection) 10/16/2017   Hypokalemia 10/16/2017   Hypomagnesemia 15/17/6160   Acute metabolic encephalopathy 73/71/0626   Osteoporosis, postmenopausal 09/10/2017   Chronic diarrhea 08/26/2017   Essential hypertension 02/20/2016   Dyspnea 02/19/2016    Orientation RESPIRATION BLADDER Height & Weight     Self, Time, Situation, Place  Normal Continent Weight: 133 lb (60.3 kg) Height:  5\' 3"  (160 cm)  BEHAVIORAL SYMPTOMS/MOOD NEUROLOGICAL BOWEL NUTRITION STATUS      Continent Diet  AMBULATORY STATUS COMMUNICATION OF NEEDS Skin   Extensive Assist Verbally Surgical wounds (Abdomen)                       Personal Care Assistance Level of Assistance  Bathing, Feeding, Dressing Bathing Assistance: Maximum assistance Feeding assistance: Independent Dressing Assistance: Limited assistance      Functional Limitations Info  Sight, Hearing, Speech Sight Info: Adequate Hearing Info: Adequate Speech Info: Adequate    SPECIAL CARE FACTORS FREQUENCY  PT (By licensed PT), OT (By licensed OT)     PT Frequency: 5x OT Frequency: 5x            Contractures Contractures Info: Not present    Additional Factors Info  Code Status, Allergies, Insulin Sliding Scale, Psychotropic Code Status Info: Full Allergies Info: n/a Psychotropic Info: sertraline Insulin Sliding Scale Info: Correction coverage: Moderate (average weight, post-op)   CBG < 70: Implement Hypoglycemia Standing Orders and refer to Hypoglycemia Standing Orders sidebar report   CBG 70 - 120: 0 units   CBG 121 - 150: 2 units   CBG 151 - 200: 3 units   CBG 201 - 250: 5 units   CBG 251 - 300: 8 units   CBG 301 - 350: 11 units   CBG 351 - 400: 15 units   CBG > 400 call MD and obtain STAT lab verification       Current Medications (08/12/2021):  This is the current hospital active medication list Current Facility-Administered Medications  Medication Dose Route Frequency Provider Last Rate Last Admin   acetaminophen (TYLENOL) tablet 1,000 mg  1,000 mg Oral Q6H PRN Virl Cagey, MD   1,000 mg at 08/12/21 0920   atorvastatin (LIPITOR) tablet 20 mg  20 mg Oral Daily Virl Cagey, MD   20 mg at 08/12/21 9485   cefoTEtan (CEFOTAN) 2 g in sodium chloride 0.9 % 100 mL IVPB  2 g Intravenous Q24H Curlene Labrum  C, MD 200 mL/hr at 08/12/21 1012 2 g at 08/12/21 1012   Chlorhexidine Gluconate Cloth 2 % PADS 6 each  6 each Topical Daily Virl Cagey, MD   6 each at 08/12/21 5732   diclofenac Sodium (VOLTAREN) 1 % topical gel 2 g  2 g Topical QID PRN Virl Cagey, MD   2 g at 08/10/21 2200   diphenhydrAMINE (BENADRYL) 12.5 MG/5ML elixir 12.5 mg  12.5 mg Per Tube Q6H PRN Virl Cagey, MD       Or   diphenhydrAMINE (BENADRYL) injection 12.5 mg  12.5 mg Intravenous Q6H PRN Virl Cagey, MD   12.5 mg at  08/10/21 1612   docusate sodium (COLACE) capsule 100 mg  100 mg Oral BID Virl Cagey, MD   100 mg at 08/12/21 2025   donepezil (ARICEPT) tablet 10 mg  10 mg Oral QHS Virl Cagey, MD   10 mg at 08/11/21 2111   DULoxetine (CYMBALTA) DR capsule 60 mg  60 mg Oral Daily Virl Cagey, MD   60 mg at 08/12/21 4270   enalapril (VASOTEC) tablet 20 mg  20 mg Oral Daily Virl Cagey, MD   20 mg at 08/12/21 1021   famotidine (PEPCID) tablet 20 mg  20 mg Oral Daily Virl Cagey, MD   20 mg at 08/12/21 6237   hydrALAZINE (APRESOLINE) injection 5 mg  5 mg Intravenous Q6H PRN Virl Cagey, MD   5 mg at 08/11/21 1210   insulin aspart (novoLOG) injection 0-15 Units  0-15 Units Subcutaneous TID WC Virl Cagey, MD   3 Units at 08/12/21 1152   labetalol (NORMODYNE) tablet 200 mg  200 mg Oral BID Virl Cagey, MD   200 mg at 08/12/21 6283   levothyroxine (SYNTHROID) tablet 25 mcg  25 mcg Oral QAC breakfast Virl Cagey, MD   25 mcg at 08/12/21 0630   metoprolol tartrate (LOPRESSOR) injection 5 mg  5 mg Intravenous Q6H PRN Virl Cagey, MD   5 mg at 08/11/21 1638   morphine 2 MG/ML injection 2 mg  2 mg Intravenous Q3H PRN Virl Cagey, MD       ondansetron (ZOFRAN-ODT) disintegrating tablet 4 mg  4 mg Oral Q6H PRN Virl Cagey, MD   4 mg at 08/11/21 2111   Or   ondansetron (ZOFRAN) injection 4 mg  4 mg Intravenous Q6H PRN Virl Cagey, MD   4 mg at 08/11/21 1538   oxyCODONE (Oxy IR/ROXICODONE) immediate release tablet 5 mg  5 mg Oral Q4H PRN Virl Cagey, MD   5 mg at 08/12/21 1517   sertraline (ZOLOFT) tablet 25 mg  25 mg Oral Daily Virl Cagey, MD   25 mg at 08/12/21 6160     Discharge Medications: Please see discharge summary for a list of discharge medications.  Relevant Imaging Results:  Relevant Lab Results:   Additional Information PT SSN [id number]  Natasha Bence, LCSW

## 2021-08-12 NOTE — Progress Notes (Signed)
   08/12/21 0935  Assess: MEWS Score  Temp 98.3 F (36.8 C)  BP (!) 206/77  Pulse Rate 85  SpO2 94 %  Assess: MEWS Score  MEWS Temp 0  MEWS Systolic 2  MEWS Pulse 0  MEWS RR 0  MEWS LOC 0  MEWS Score 2  MEWS Score Color Yellow  Assess: if the MEWS score is Yellow or Red  Were vital signs taken at a resting state? Yes  Focused Assessment No change from prior assessment  Early Detection of Sepsis Score *See Row Information* Low  MEWS guidelines implemented *See Row Information* Yes  Treat  MEWS Interventions Other (Comment) (MD notified of mews change)  Pain Scale 0-10  Pain Score 8  Pain Type Surgical pain;Acute pain  Pain Location Knee  Pain Orientation Left  Pain Descriptors / Indicators Throbbing  Pain Frequency Intermittent  Pain Onset On-going  Patients Stated Pain Goal 0  Pain Intervention(s) Medication (See eMAR)  Take Vital Signs  Increase Vital Sign Frequency  Yellow: Q 2hr X 2 then Q 4hr X 2, if remains yellow, continue Q 4hrs  Escalate  MEWS: Escalate Yellow: discuss with charge nurse/RN and consider discussing with provider and RRT  Notify: Charge Nurse/RN  Name of Charge Nurse/RN Notified Audrea Muscat, RN  Date Charge Nurse/RN Notified 08/12/21  Time Charge Nurse/RN Notified [id number]  Notify: Provider  Provider Name/Title [facility]  Date Provider Notified 08/12/21  Notification Type  (secured chat)  Notification Reason Other (Comment) (yellow mews)  Provider response See new orders  Date of Provider Response 08/12/21  Time of Provider Response [phone]

## 2021-08-13 LAB — GLUCOSE, CAPILLARY
Glucose-Capillary: 170 mg/dL — ABNORMAL HIGH (ref 70–99)
Glucose-Capillary: 182 mg/dL — ABNORMAL HIGH (ref 70–99)
Glucose-Capillary: 212 mg/dL — ABNORMAL HIGH (ref 70–99)
Glucose-Capillary: 274 mg/dL — ABNORMAL HIGH (ref 70–99)

## 2021-08-13 LAB — BASIC METABOLIC PANEL
Anion gap: 6 (ref 5–15)
BUN: 26 mg/dL — ABNORMAL HIGH (ref 8–23)
CO2: 28 mmol/L (ref 22–32)
Calcium: 8.2 mg/dL — ABNORMAL LOW (ref 8.9–10.3)
Chloride: 100 mmol/L (ref 98–111)
Creatinine, Ser: 1.17 mg/dL — ABNORMAL HIGH (ref 0.44–1.00)
GFR, Estimated: 46 mL/min — ABNORMAL LOW (ref >60)
Glucose, Bld: 195 mg/dL — ABNORMAL HIGH (ref 70–99)
Potassium: 4 mmol/L (ref 3.5–5.1)
Sodium: 134 mmol/L — ABNORMAL LOW (ref 135–145)

## 2021-08-13 MED ORDER — INSULIN ASPART 100 UNIT/ML IJ SOLN: 0.0000 [IU] | Freq: Three times a day (TID) | INTRAMUSCULAR | Status: DC

## 2021-08-13 MED ORDER — INSULIN ASPART 100 UNIT/ML IJ SOLN
3.0000 [IU] | Freq: Three times a day (TID) | INTRAMUSCULAR | Status: DC
  Administered 2021-08-13: 3 [IU] via SUBCUTANEOUS

## 2021-08-13 MED ORDER — HYDROCHLOROTHIAZIDE 25 MG PO TABS
12.5000 mg | ORAL_TABLET | Freq: Every day | ORAL | Status: DC
  Administered 2021-08-13 – 2021-08-14 (×2): 12.5 mg via ORAL
  Filled 2021-08-13 (×2): qty 1

## 2021-08-13 MED ORDER — COLESTIPOL HCL 1 G PO TABS
1.0000 g | ORAL_TABLET | Freq: Two times a day (BID) | ORAL | Status: DC
  Administered 2021-08-13 – 2021-08-14 (×3): 1 g via ORAL
  Filled 2021-08-13 (×7): qty 1

## 2021-08-13 MED ORDER — RISAQUAD PO CAPS
1.0000 | ORAL_CAPSULE | Freq: Every day | ORAL | Status: DC
  Administered 2021-08-13 – 2021-08-14 (×2): 1 via ORAL
  Filled 2021-08-13 (×2): qty 1

## 2021-08-13 MED ORDER — INSULIN ASPART 100 UNIT/ML IJ SOLN
0.0000 [IU] | Freq: Every day | INTRAMUSCULAR | Status: DC
  Administered 2021-08-13: 3 [IU] via SUBCUTANEOUS

## 2021-08-13 NOTE — Progress Notes (Signed)
[facility] Progress Note  5 Days Post-Op  Subjective: Eating and having Bms, working with  PT and needs SNF. SW working on placement.   Objective: Vital signs in last 24 hours: Temp:  [98.3 F (36.8 C)-98.9 F (37.2 C)] 98.9 F (37.2 C) (11/15 1343) Pulse Rate:  [73-88] 73 (11/15 1343) Resp:  [17-19] 18 (11/15 1343) BP: (131-199)/(60-110) 131/60 (11/15 1343) SpO2:  [94 %-99 %] 99 % (11/15 1343) Last BM Date: 08/13/21 (per patient)  Intake/Output from previous day: 11/14 0701 - 11/15 0700 In: 960 [P.O.:960] Out: -  Intake/Output this shift: Total I/O In: 480 [P.O.:480] Out: -   General appearance: alert and no distress Resp: normal  work of breathing GI: soft, nondistended, nontender, staples c/d/I without erythema or drainage  Lab Results:  Recent Labs    08/11/21 0540 08/12/21 0451  WBC 8.8 7.3  HGB 7.9* 7.4*  HCT 24.7* 23.9*  PLT 179 159   BMET Recent Labs    08/12/21 0451 08/13/21 0526  NA 135 134*  K 4.2 4.0  CL 102 100  CO2 28 28  GLUCOSE 170* 195*  BUN 30* 26*  CREATININE 1.20* 1.17*  CALCIUM 8.2* 8.2*   PT/INR No results for input(s): LABPROT, INR in the last 72 hours.  Studies/Results: No results found.  Anti-infectives: Anti-infectives (From admission, onward)    Start     Dose/Rate Route Frequency Ordered Stop   08/11/21 0845  cefoTEtan (CEFOTAN) 2 g in sodium chloride 0.9 % 100 mL IVPB        2 g 200 mL/hr over 30 Minutes Intravenous Every 24 hours 08/10/21 1121 08/13/21 1021   08/10/21 1000  cefoTEtan (CEFOTAN) 2 g in sodium chloride 0.9 % 100 mL IVPB  Status:  Discontinued        2 g 200 mL/hr over 30 Minutes Intravenous Every 12 hours 08/09/21 0118 08/10/21 1121   08/08/21 2100  cefoTEtan (CEFOTAN) 2 g in sodium chloride 0.9 % 100 mL IVPB        2 g 200 mL/hr over 30 Minutes Intravenous On call to O.R. 08/08/21 2046 08/09/21 0116   08/08/21 2030  piperacillin-tazobactam (ZOSYN) IVPB 3.375 g        3.375  g 100 mL/hr over 30 Minutes Intravenous  Once 08/08/21 2029 08/08/21 2120       Assessment/Plan: Patient doing well s/p  Ex lap, removal foreign body and oversewing of perforation.    Soft diet PRN for pain IS, OOB Home  meds Probiotic and home BR ordered  SCDs PT  COVID retest for SNF  Updated family    LOS: 4 days    Virl Cagey 08/13/2021

## 2021-08-13 NOTE — Progress Notes (Signed)
Inpatient Diabetes Program Recommendations  AACE/ADA: New Consensus Statement on Inpatient Glycemic Control   Target Ranges:  Prepandial:   less than 140 mg/dL      Peak postprandial:   less than 180 mg/dL (1-2 hours)      Critically ill patients:  140 - 180 mg/dL   Results for DELANEY, PERONA (MRN [id number]) as of 08/13/2021 10:03  Ref. Range 08/12/2021 07:06 08/12/2021 11:15 08/12/2021 16:17 08/12/2021 22:15 08/13/2021 07:10  Glucose-Capillary Latest Ref Range: 70 - 99 mg/dL 168 (H) 195 (H) 240 (H) 267 (H) 170 (H)    Review of Glycemic Control  Diabetes history: DM2 Outpatient Diabetes medications: Soliqua 22 units QAM Current orders for Inpatient glycemic control: Novolog 0-15 units TID with meals  Inpatient Diabetes Program Recommendations:    Insulin:  Please consider ordering Novolog 0-5 units QHS and Novolog 3 units TID with meals for meal coverage if patient eats at least 50% of meals.  Thanks, Barnie Alderman, RN, MSN, CDE Diabetes Coordinator Inpatient Diabetes Program [phone] (Team Pager from 8am to 5pm)

## 2021-08-13 NOTE — TOC Progression Note (Addendum)
Transition of Care [facility]) - Progression Note    Patient Details  Name: [name] [name] MRN: [id number] Date of Birth: 1935-09-30  Transition of Care [facility]) CM/SW Contact  Natasha Bence, LCSW Phone Number: 08/13/2021, 4:19 PM  Clinical Narrative:    Mardene Celeste with [facility] agreeable to take patient tomorrow on 11/16. CSW added [facility]  to authorization. #[id number] Authorization approved.  TOC to follow.   Expected Discharge Plan: [place] Barriers to Discharge: Continued Medical Work up  Expected Discharge Plan and Services Expected Discharge Plan: [place] arrangements for the past 2 months: Single Family Home                                       Social Determinants of Health (SDOH) Interventions    Readmission Risk Interventions No flowsheet data found.

## 2021-08-13 NOTE — Progress Notes (Signed)
Physical Therapy Treatment Patient Details Name: [name] [name] MRN: [id number] DOB: May 02, 1936 Today's Date: 08/13/2021   History of Present Illness [name] [name] is a 85 y/o female s/p  Exploratory laparotomy, oversewn of small bowel perforation on 08/09/21 who lives at home with her husband and has a history of chronic diarrhea, GERD, HTN and diabetes. She was at home and started having abdominal pain that was generalized and started to ultimately radiate up to her upper abdomen. She had associated nausea but no vomiting. She says the pain was worse as the day progressed. She was originally hypertensive and was given medication in the ED. She takes medication for chronic diarrhea. She is not sure if she ever had any diverticulitis. She sees GI regularly.  She is here with her daughter today. She says her pain is getting worse.    PT Comments    Patient presents seated in chair (assisted by nursing staff) and agreeable for therapy.  Patient demonstrates improvement for following instructions for completing BLE exercises requiring less verbal cueing, c/o mild increase in pain left knee with movement and limited to a few slow labored unsteady side steps at bedside due to generalized weakness and poor standing balance.  Patient tolerated staying up in chair after therapy with her daughter present in room.  Patient will benefit from continued physical therapy in hospital and recommended venue below to increase strength, balance, endurance for safe ADLs and gait.    Recommendations for follow up therapy are one component of a multi-disciplinary discharge planning process, led by the attending physician.  Recommendations may be updated based on patient status, additional functional criteria and insurance authorization.  Follow Up Recommendations  Skilled nursing-short term rehab (<3 hours/day)     Assistance Recommended at Discharge    Equipment Recommendations  None recommended by PT     Recommendations for Other Services       Precautions / Restrictions Precautions Precautions: Fall Restrictions Weight Bearing Restrictions: No     Mobility  Bed Mobility               General bed mobility comments: Patient presents seated in chair (assisted by nursing staff)    Transfers Overall transfer level: Needs assistance Equipment used: Rolling walker (2 wheels) Transfers: Sit to/from Stand;Bed to chair/wheelchair/BSC Sit to Stand: Min assist Stand pivot transfers: Mod assist         General transfer comment: slow labored unsteady movement    Ambulation/Gait Ambulation/Gait assistance: Mod assist Gait Distance (Feet): 6 Feet Assistive device: Rolling walker (2 wheels) Gait Pattern/deviations: Decreased step length - right;Decreased step length - left;Decreased stride length Gait velocity: decreased     General Gait Details: limited to a few slow labored unsteady steps with buckling of knees due to weakness   Stairs             Wheelchair Mobility    Modified Rankin (Stroke Patients Only)       Balance Overall balance assessment: Needs assistance Sitting-balance support: Feet supported;No upper extremity supported Sitting balance-Leahy Scale: Good Sitting balance - Comments: seated at EOB   Standing balance support: Reliant on assistive device for balance;During functional activity;Bilateral upper extremity supported Standing balance-Leahy Scale: Poor Standing balance comment: fair/poor using RW                            Cognition Arousal/Alertness: Awake/alert Behavior During Therapy: WFL for tasks assessed/performed Overall Cognitive Status: Within Functional  Limits for tasks assessed                                          Exercises General Exercises - Lower Extremity Long Arc Quad: Seated;AROM;Strengthening;Both;10 reps Hip Flexion/Marching: Seated;AROM;Strengthening;Both;10 reps Toe Raises:  Seated;AROM;Strengthening;Both;10 reps Heel Raises: Seated;AROM;Strengthening;Both;10 reps    General Comments        Pertinent Vitals/Pain Pain Assessment: 0-10 Pain Score: 5  Pain Location: left knee Pain Descriptors / Indicators: Sore;Aching Pain Intervention(s): Limited activity within patient's tolerance;Monitored during session;Repositioned    Home Living                          Prior Function            PT Goals (current goals can now be found in the care plan section) Acute Rehab PT Goals Patient Stated Goal: return home with family to assist after rehab PT Goal Formulation: With patient/family Time For Goal Achievement: 08/23/21 Potential to Achieve Goals: Good Progress towards PT goals: Progressing toward goals    Frequency    Min 3X/week      PT Plan Current plan remains appropriate    Co-evaluation              AM-PAC PT "6 Clicks" Mobility   Outcome Measure  Help needed turning from your back to your side while in a flat bed without using bedrails?: A Little Help needed moving from lying on your back to sitting on the side of a flat bed without using bedrails?: A Little Help needed moving to and from a bed to a chair (including a wheelchair)?: A Lot Help needed standing up from a chair using your arms (e.g., wheelchair or bedside chair)?: A Lot Help needed to walk in hospital room?: A Lot Help needed climbing 3-5 steps with a railing? : A Lot 6 Click Score: 14    End of Session   Activity Tolerance: Patient tolerated treatment well;Patient limited by fatigue Patient left: in chair;with call bell/phone within reach;with family/visitor present Nurse Communication: Mobility status PT Visit Diagnosis: Unsteadiness on feet (R26.81);Other abnormalities of gait and mobility (R26.89);Muscle weakness (generalized) (M62.81)     Time: 2423-5361 PT Time Calculation (min) (ACUTE ONLY): 25 min  Charges:  $Therapeutic Exercise: 8-22  mins $Therapeutic Activity: 8-22 mins                     2:33 PM, 08/13/21 Lonell Grandchild, MPT Physical Therapist with [facility] 336 [phone] office [phone] mobile phone

## 2021-08-14 DIAGNOSIS — K631 Perforation of intestine (nontraumatic): Secondary | ICD-10-CM | POA: Diagnosis not present

## 2021-08-14 DIAGNOSIS — I509 Heart failure, unspecified: Secondary | ICD-10-CM | POA: Diagnosis not present

## 2021-08-14 DIAGNOSIS — Z48815 Encounter for surgical aftercare following surgery on the digestive system: Secondary | ICD-10-CM | POA: Diagnosis not present

## 2021-08-14 DIAGNOSIS — K529 Noninfective gastroenteritis and colitis, unspecified: Secondary | ICD-10-CM | POA: Diagnosis not present

## 2021-08-14 DIAGNOSIS — I10 Essential (primary) hypertension: Secondary | ICD-10-CM | POA: Diagnosis not present

## 2021-08-14 DIAGNOSIS — N179 Acute kidney failure, unspecified: Secondary | ICD-10-CM | POA: Diagnosis not present

## 2021-08-14 LAB — RESP PANEL BY RT-PCR (FLU A&B, COVID) ARPGX2
Influenza A by PCR: NEGATIVE
Influenza B by PCR: NEGATIVE
SARS Coronavirus 2 by RT PCR: NEGATIVE

## 2021-08-14 LAB — GLUCOSE, CAPILLARY
Glucose-Capillary: 146 mg/dL — ABNORMAL HIGH (ref 70–99)
Glucose-Capillary: 220 mg/dL — ABNORMAL HIGH (ref 70–99)

## 2021-08-14 MED ORDER — OXYCODONE HCL 5 MG PO TABS: 5.0000 mg | ORAL_TABLET | ORAL | 0 refills | Status: DC | PRN

## 2021-08-14 MED ORDER — ONDANSETRON 4 MG PO TBDP: 4.0000 mg | ORAL_TABLET | Freq: Four times a day (QID) | ORAL | 0 refills | Status: DC | PRN

## 2021-08-14 NOTE — Discharge Summary (Signed)
Physician Discharge Summary  Patient ID: [name] [name] MRN: [id number] DOB/AGE: October 08, 1935 85 y.o.  Admit date: 08/08/2021 Discharge date: 08/14/2021  Admission Diagnoses: Pneumoperitoneum   Discharge Diagnoses:  Principal Problem:   Perforated bowel [facility])   Discharged Condition: good  Hospital Course: [name] [name] is a 85 yo who presented to the hospital with acute onset of abdominal pain and signs of pneumoperitoneum on CT scan of unknown etiology. She was taken to surgery for exploration on 08/08/2021 and was found to have a small foreign body creating a pin point hole in her small intestine. This was removed and the hole was oversewed. She remained in the hospital with NG tube until she was having bowel function. Once she was having bowel function her diet as advanced and she was progressed from a liquid to a soft diet.  She was having Bms and has a history of diarrhea and was put back on her home probiotic and colestid.  She was seen by physical therapy who ultimately recommended a SNF for rehab.  She was ambulating with assistance. She was voiding normally. She did require some fluid resuscitation for a mild acute kidney injury and this improved during her admission. Her O2 was weaned off and she was ambulated and noted to keep saturations above 93%. She was encouraged to continue her incentive spirometry and physical rehab.   Consults:  Physical therapy   Significant Diagnostic Studies:  Results for [name] [name] (MRN [id number]) as of 08/14/2021 14:29  Ref. Range 08/13/2021 22:55  RESP PANEL BY RT-PCR (FLU A&B, COVID) ARPGX2 Unknown Rpt  Influenza A By PCR Latest Ref Range: NEGATIVE  NEGATIVE  Influenza B By PCR Latest Ref Range: NEGATIVE  NEGATIVE  SARS Coronavirus 2 by RT PCR Latest Ref Range: NEGATIVE  NEGATIVE    Treatments: IV hydration and Exploratory laparotomy, oversew of bowel perforation  Discharge Exam: Blood pressure (!) 154/78, pulse 82, temperature 98.2 F (36.8 C),  resp. rate 17, height 5\' 3"  (1.6 m), weight 60.3 kg, SpO2 93 %. General appearance: alert, cooperative, and no distress Resp: normal work of breathing GI: soft, nondistended, appropriately tender, staples c/d/I with no erythema or drainage  Disposition: Discharge disposition: 01-Home or Self Care       Discharge Instructions     Call MD for:  difficulty breathing, headache or visual disturbances   Complete by: As directed    Call MD for:  extreme fatigue   Complete by: As directed    Call MD for:  persistant dizziness or light-headedness   Complete by: As directed    Call MD for:  persistant nausea and vomiting   Complete by: As directed    Call MD for:  redness, tenderness, or signs of infection (pain, swelling, redness, odor or green/yellow discharge around incision site)   Complete by: As directed    Call MD for:  severe uncontrolled pain   Complete by: As directed    Call MD for:  temperature >100.4   Complete by: As directed    Increase activity slowly   Complete by: As directed       Allergies as of 08/14/2021   No Known Allergies      Medication List     TAKE these medications    Accu-Chek Guide test strip Generic drug: glucose blood USE AS DIRECTED THREE TIMES DAILY   atorvastatin 40 MG tablet Commonly known as: LIPITOR Take 20 mg by mouth daily.   B-12 1000 MCG Tabs Take 1,000  mcg by mouth daily.   Calcium Citrate-Vitamin D 200-250 MG-UNIT Tabs Take 1 tablet by mouth daily.   Centrum tablet Take 1 tablet by mouth daily.   colestipol 1 g tablet Commonly known as: COLESTID Take 1 tablet (1 g total) by mouth 2 (two) times daily.   Comfort EZ Pen Needles 31G X 5 MM Misc Generic drug: Insulin Pen Needle   diclofenac sodium 1 % Gel Commonly known as: VOLTAREN Apply 2 g topically at bedtime.   donepezil 10 MG tablet Commonly known as: ARICEPT Take 1 tablet (10 mg total) by mouth at bedtime. Start half a tablet daily into 1 month and then  increase to 1 tablet daily What changed: additional instructions   DULoxetine 60 MG capsule Commonly known as: CYMBALTA Take 60 mg by mouth daily.   enalapril 20 MG tablet Commonly known as: VASOTEC Take 20 mg by mouth daily.   famotidine 20 MG tablet Commonly known as: PEPCID Take 20 mg by mouth daily.   ferrous sulfate 325 (65 FE) MG EC tablet Take 325 mg by mouth daily with breakfast.   hydrochlorothiazide 12.5 MG tablet Commonly known as: HYDRODIURIL Take 12.5 mg by mouth daily.   labetalol 200 MG tablet Commonly known as: NORMODYNE Take 200 mg by mouth 2 (two) times daily.   levothyroxine 25 MCG tablet Commonly known as: SYNTHROID Take 25 mcg by mouth daily before breakfast.   magnesium oxide 400 MG tablet Commonly known as: MAG-OX Take 400 mg by mouth daily.   ondansetron 4 MG disintegrating tablet Commonly known as: ZOFRAN-ODT Take 1 tablet (4 mg total) by mouth every 6 (six) hours as needed for nausea.   oxyCODONE 5 MG immediate release tablet Commonly known as: Oxy IR/ROXICODONE Take 1 tablet (5 mg total) by mouth every 4 (four) hours as needed for severe pain or breakthrough pain.   RA PROBIOTIC DIGESTIVE CARE PO Take 1 capsule by mouth daily.   sertraline 25 MG tablet Commonly known as: ZOLOFT Take 25 mg by mouth daily.   Soliqua 100-33 UNT-MCG/ML Sopn Generic drug: Insulin Glargine-Lixisenatide INJECT 22 UNITS SQ DAILY BEFORE BREAKFast What changed: See the new instructions.        Contact information for follow-up providers     Virl Cagey, MD Follow up on 08/20/2021.   Specialty: General Surgery Why: staple removal Contact information: [address] Linna Hoff [facility] [address] [phone]              Contact information for after-discharge care     Destination     [facility] .   Service: Skilled Nursing Contact information: [address] [place] [phone]                   Discharge Open Abdominal Surgery Instructions:  Common Complaints: Pain at the incision site is common. This will improve with time. Take your pain medications as described below. Some nausea is common and poor appetite. The main goal is to stay hydrated the first few days after surgery.   Diet/ Activity: Diet as tolerated. You have started and tolerated a diet in the hospital, and should continue to increase what you are able to eat.   You may not have a large appetite, but it is important to stay hydrated. Drink 64 ounces of water a day. Your appetite will return with time.  Shower per your regular routine daily.  Do not take hot  showers. Take warm showers that are less than 10 minutes. Pat the incision dry. Rest and listen to your body, but do not remain in bed all day.  Walk everyday for at least 15-20 minutes. Deep cough and move around every 1-2 hours in the first few days after surgery.  Do not lift > 10 lbs, perform excessive bending, pushing, pulling, squatting for 6-8 weeks after surgery.  The activity restrictions and the abdominal binder are to prevent hernia formation at your incision while you are healing.  Do not place lotions or balms on your incision unless instructed to specifically by Dr. Constance Haw.   Pain Expectations and Narcotics: -After surgery you will have pain associated with your incisions and this is normal. The pain is muscular and nerve pain, and will get better with time. -You are encouraged and expected to take non narcotic medications like tylenol and ibuprofen (when able) to treat pain as multiple modalities can aid with pain treatment. -Narcotics are only used when pain is severe or there is breakthrough pain. -You are not expected to have a pain score of 0 after surgery, as we cannot prevent pain. A pain score of 3-4 that allows you to be functional, move, walk, and tolerate some activity is the  goal. The pain will continue to improve over the days after surgery and is dependent on your surgery. -Due to [place] law, we are only able to give a certain amount of pain medication to treat post operative pain, and we only give additional narcotics on a patient by patient basis.  -For most laparoscopic surgery, studies have shown that the majority of patients only need 10-15 narcotic pills, and for open surgeries most patients only need 15-20.   -Having appropriate expectations of pain and knowledge of pain management with non narcotics is important as we do not want anyone to become addicted to narcotic pain medication.  -Using ice packs in the first 48 hours and heating pads after 48 hours, wearing an abdominal binder (when recommended), and using over the counter medications are all ways to help with pain management.   -Simple acts like meditation and mindfulness practices after surgery can also help with pain control and research has proven the benefit of these practices.  Medication: Take tylenol and ibuprofen as needed for pain control, alternating every 4-6 hours.  Example:  Tylenol 1000mg  @ 6am, 12noon, 6pm, 58midnight (Do not exceed 4000mg  of tylenol a day). Ibuprofen 800mg  @ 9am, 3pm, 9pm, 3am (Do not exceed 3600mg  of ibuprofen a day).  Take Roxicodone for breakthrough pain every 4 hours.  Take Colace for constipation related to narcotic pain medication.  Drink plenty of water to also prevent constipation.   Contact Information: If you have questions or concerns, please call our office, [phone], Monday- Thursday 8AM-5PM and Friday 8AM-12Noon.  If it is after hours or on the weekend, please call [facility] Main Number, [phone], [phone], and ask to speak to the surgeon on call for Dr. Constance Haw at [facility].   Signed: Virl Cagey 08/14/2021, 2:27 PM

## 2021-08-14 NOTE — TOC Transition Note (Signed)
Transition of Care [facility]) - CM/SW Discharge Note  Patient Details  Name: NIZHONI PARLOW MRN: [id number] Date of Birth: 1936-08-24  [facility]) CM/SW Contact:  Iona Beard, [place] Phone Number: 08/14/2021, 2:23 PM  Clinical Narrative:    TOC made aware of pts readiness for d/c. CSW spoke to [place] with [facility] who states she will speak with family. Pt will be going to room 135 bed 1 and the number for report is [phone], CSW provided this information to pts RN. CSW updated pts daughter Destyne Goodreau that pt is ready for transition to the facility and that [place] will set up transportation via wheelchair. CSW also attempted to call pts husband, no answer on cell or home phone, pts daughter states she will update her father of pts transition to SNF today. TOC signing off.   Final next level of care: Skilled Nursing Facility Barriers to Discharge: Barriers Resolved   Patient Goals and CMS Choice Patient states their goals for this hospitalization and ongoing recovery are:: Go to rehab CMS Medicare.gov Compare Post Acute Care list provided to:: Patient Choice offered to / list presented to : Patient  Discharge Placement              Patient chooses bed at: Other - please specify in the comment section below: ([place]) Patient to be transferred to facility by: [facility] Name of family member notified: Merrissa Giacobbe Patient and family notified of of transfer: 08/14/21  Discharge Plan and Services                                     Social Determinants of Health (SDOH) Interventions     Readmission Risk Interventions No flowsheet data found.

## 2021-08-14 NOTE — Progress Notes (Signed)
Took oxygen off patient while monitoring. Oxygen sats on room air remain 94-96% at rest, while sleeping went to 91-93%. Attempted to ambulate, when patient got to standing position with walker her oxygen sat was 90%. Stated that she felt unsteady and short of breath and requested to sit back down.

## 2021-08-14 NOTE — Progress Notes (Signed)
Ambulated pt about 4 ft in her room without oxygen while monitoring oxygen sat. Sats remained 94-95%.

## 2021-08-14 NOTE — Care Management Important Message (Signed)
Important Message  Patient Details  Name: [name] [name] MRN: [id number] Date of Birth: 12/29/1935   Medicare Important Message Given:  Yes     Tommy Medal 08/14/2021, 1:37 PM

## 2021-08-14 NOTE — Discharge Instructions (Addendum)
Discharge Open Abdominal Surgery Instructions:  Common Complaints: Pain at the incision site is common. This will improve with time. Take your pain medications as described below. Some nausea is common and poor appetite. The main goal is to stay hydrated the first few days after surgery.   Diet/ Activity: Diet as tolerated. You have started and tolerated a diet in the hospital, and should continue to increase what you are able to eat.   You may not have a large appetite, but it is important to stay hydrated. Drink 64 ounces of water a day. Your appetite will return with time.  Shower per your regular routine daily.  Do not take hot showers. Take warm showers that are less than 10 minutes. Pat the incision dry. Rest and listen to your body, but do not remain in bed all day.  Walk everyday for at least 15-20 minutes. Deep cough and move around every 1-2 hours in the first few days after surgery.  Do not lift > 10 lbs, perform excessive bending, pushing, pulling, squatting for 6-8 weeks after surgery.  The activity restrictions and the abdominal binder are to prevent hernia formation at your incision while you are healing.  Do not place lotions or balms on your incision unless instructed to specifically by Dr. Constance Haw.   Pain Expectations and Narcotics: -After surgery you will have pain associated with your incisions and this is normal. The pain is muscular and nerve pain, and will get better with time. -You are encouraged and expected to take non narcotic medications like tylenol and ibuprofen (when able) to treat pain as multiple modalities can aid with pain treatment. -Narcotics are only used when pain is severe or there is breakthrough pain. -You are not expected to have a pain score of 0 after surgery, as we cannot prevent pain. A pain score of 3-4 that allows you to be functional, move, walk, and tolerate some activity is the goal. The pain will continue to improve over the days after surgery  and is dependent on your surgery. -Due to [place] law, we are only able to give a certain amount of pain medication to treat post operative pain, and we only give additional narcotics on a patient by patient basis.  -For most laparoscopic surgery, studies have shown that the majority of patients only need 10-15 narcotic pills, and for open surgeries most patients only need 15-20.   -Having appropriate expectations of pain and knowledge of pain management with non narcotics is important as we do not want anyone to become addicted to narcotic pain medication.  -Using ice packs in the first 48 hours and heating pads after 48 hours, wearing an abdominal binder (when recommended), and using over the counter medications are all ways to help with pain management.   -Simple acts like meditation and mindfulness practices after surgery can also help with pain control and research has proven the benefit of these practices.  Medication: Take tylenol and ibuprofen as needed for pain control, alternating every 4-6 hours.  Example:  Tylenol 1000mg  @ 6am, 12noon, 6pm, 76midnight (Do not exceed 4000mg  of tylenol a day). Ibuprofen 800mg  @ 9am, 3pm, 9pm, 3am (Do not exceed 3600mg  of ibuprofen a day).  Take Roxicodone for breakthrough pain every 4 hours.  Take Colace for constipation related to narcotic pain medication.  Drink plenty of water to also prevent constipation.   Contact Information: If you have questions or concerns, please call our office, [phone], Monday- Thursday 8AM-5PM and Friday 8AM-12Noon.  If it is after hours or on the weekend, please call [facility] Main Number, [phone], [phone], and ask to speak to the surgeon on call for Dr. Constance Haw at [facility].

## 2021-08-14 NOTE — Progress Notes (Signed)
Report called to [facility]. Given to [place], LPN.

## 2021-08-15 ENCOUNTER — Ambulatory Visit: Payer: Medicare PPO | Admitting: Neurology

## 2021-08-16 DIAGNOSIS — I10 Essential (primary) hypertension: Secondary | ICD-10-CM | POA: Diagnosis not present

## 2021-08-16 DIAGNOSIS — K631 Perforation of intestine (nontraumatic): Secondary | ICD-10-CM | POA: Diagnosis not present

## 2021-08-16 LAB — AEROBIC/ANAEROBIC CULTURE W GRAM STAIN (SURGICAL/DEEP WOUND)

## 2021-08-20 ENCOUNTER — Other Ambulatory Visit: Payer: Self-pay

## 2021-08-20 ENCOUNTER — Ambulatory Visit (INDEPENDENT_AMBULATORY_CARE_PROVIDER_SITE_OTHER): Payer: Medicare PPO | Admitting: General Surgery

## 2021-08-20 ENCOUNTER — Encounter: Payer: Self-pay | Admitting: General Surgery

## 2021-08-20 VITALS — BP 136/55 | HR 70 | Temp 98.7°F | Resp 12 | Ht 63.0 in | Wt 135.0 lb

## 2021-08-20 DIAGNOSIS — K631 Perforation of intestine (nontraumatic): Secondary | ICD-10-CM

## 2021-08-20 NOTE — Patient Instructions (Signed)
Steristrips will peel off in the next 5-7 days. You can remove them once they are peeling off. It is ok to shower. Pat the area dry.  Diet as tolerated. Keep stools regular, you may have more diarrhea but this will even itself out. No heavy lifting > 10 lbs, excessive bending, pushing, pulling, or squatting for 6-8 weeks after surgery.

## 2021-08-20 NOTE — Progress Notes (Signed)
[facility] Note   HPI:  85 y.o. Female presents to clinic for post-op follow-up evaluation after bowel perforation and repair. Doing well. Patient reports doing rehab and getting stronger. Eating and having Bms. She has diarrhea at baseline and is on medications chronically. Some of this will take a while to even itself out.   Review of Systems:  No soreness or pain No drainage All other review of systems: otherwise negative   Vital Signs:  BP (!) 136/55   Pulse 70   Temp 98.7 F (37.1 C) (Oral)   Resp 12   Ht 5\' 3"  (1.6 m)   Wt 135 lb (61.2 kg)   SpO2 93%   BMI 23.91 kg/m    Physical Exam:  Physical Exam Cardiovascular:     Rate and Rhythm: Normal rate.  Pulmonary:     Effort: Pulmonary effort is normal.  Abdominal:     General: There is no distension.     Palpations: Abdomen is soft.     Tenderness: There is no abdominal tenderness.     Comments: Staples removed, minor irritation from the staples, steri strips placed, no drainage   Neurological:     Mental Status: She is alert.      Assessment:  85 y.o. yo Female with a bowel perforation s/p repair. Doing well and tolerating a diet. Staples removed with minor irritation, no signs of infection.  Plan:  Steristrips will peel off in the next 5-7 days. You can remove them once they are peeling off. It is ok to shower. Pat the area dry.  Diet as tolerated. Keep stools regular, you may have more diarrhea but this will even itself out. No heavy lifting > 10 lbs, excessive bending, pushing, pulling, or squatting for 6-8 weeks after surgery.  All of the above recommendations were discussed with the patient and patient's family, and all of patient's and family's questions were answered to their expressed satisfaction.  Future Appointments  Date Time Provider [place]  09/11/2021 10:00 AM Elayne Snare, MD [facility] None  09/17/2021 10:15 AM Virl Cagey, MD [facility] None  10/07/2021  9:00 AM  [facility] 1 [facility] [place], MD [facility] [address] [place], [place] [id number] [phone] (office)

## 2021-08-26 ENCOUNTER — Ambulatory Visit (HOSPITAL_COMMUNITY): Payer: Medicare PPO

## 2021-09-08 DIAGNOSIS — Z96641 Presence of right artificial hip joint: Secondary | ICD-10-CM | POA: Diagnosis not present

## 2021-09-08 DIAGNOSIS — I110 Hypertensive heart disease with heart failure: Secondary | ICD-10-CM | POA: Diagnosis not present

## 2021-09-08 DIAGNOSIS — Z01818 Encounter for other preprocedural examination: Secondary | ICD-10-CM | POA: Diagnosis not present

## 2021-09-08 DIAGNOSIS — Z20822 Contact with and (suspected) exposure to covid-19: Secondary | ICD-10-CM | POA: Diagnosis not present

## 2021-09-08 DIAGNOSIS — Z7401 Bed confinement status: Secondary | ICD-10-CM | POA: Diagnosis not present

## 2021-09-08 DIAGNOSIS — E119 Type 2 diabetes mellitus without complications: Secondary | ICD-10-CM | POA: Diagnosis not present

## 2021-09-08 DIAGNOSIS — D649 Anemia, unspecified: Secondary | ICD-10-CM | POA: Diagnosis not present

## 2021-09-08 DIAGNOSIS — S72001D Fracture of unspecified part of neck of right femur, subsequent encounter for closed fracture with routine healing: Secondary | ICD-10-CM | POA: Diagnosis not present

## 2021-09-08 DIAGNOSIS — R279 Unspecified lack of coordination: Secondary | ICD-10-CM | POA: Diagnosis not present

## 2021-09-08 DIAGNOSIS — W19XXXA Unspecified fall, initial encounter: Secondary | ICD-10-CM | POA: Diagnosis not present

## 2021-09-08 DIAGNOSIS — D631 Anemia in chronic kidney disease: Secondary | ICD-10-CM | POA: Diagnosis not present

## 2021-09-08 DIAGNOSIS — E1122 Type 2 diabetes mellitus with diabetic chronic kidney disease: Secondary | ICD-10-CM | POA: Diagnosis not present

## 2021-09-08 DIAGNOSIS — S72001A Fracture of unspecified part of neck of right femur, initial encounter for closed fracture: Secondary | ICD-10-CM | POA: Diagnosis not present

## 2021-09-08 DIAGNOSIS — E1142 Type 2 diabetes mellitus with diabetic polyneuropathy: Secondary | ICD-10-CM | POA: Diagnosis not present

## 2021-09-08 DIAGNOSIS — G309 Alzheimer's disease, unspecified: Secondary | ICD-10-CM | POA: Diagnosis not present

## 2021-09-08 DIAGNOSIS — I129 Hypertensive chronic kidney disease with stage 1 through stage 4 chronic kidney disease, or unspecified chronic kidney disease: Secondary | ICD-10-CM | POA: Diagnosis not present

## 2021-09-08 DIAGNOSIS — F0280 Dementia in other diseases classified elsewhere without behavioral disturbance: Secondary | ICD-10-CM | POA: Diagnosis not present

## 2021-09-08 DIAGNOSIS — Z9889 Other specified postprocedural states: Secondary | ICD-10-CM | POA: Diagnosis not present

## 2021-09-08 DIAGNOSIS — Z9181 History of falling: Secondary | ICD-10-CM | POA: Diagnosis not present

## 2021-09-08 DIAGNOSIS — M25572 Pain in left ankle and joints of left foot: Secondary | ICD-10-CM | POA: Diagnosis not present

## 2021-09-08 DIAGNOSIS — Z794 Long term (current) use of insulin: Secondary | ICD-10-CM | POA: Diagnosis not present

## 2021-09-08 DIAGNOSIS — M810 Age-related osteoporosis without current pathological fracture: Secondary | ICD-10-CM | POA: Diagnosis not present

## 2021-09-08 DIAGNOSIS — R52 Pain, unspecified: Secondary | ICD-10-CM | POA: Diagnosis not present

## 2021-09-08 DIAGNOSIS — R2689 Other abnormalities of gait and mobility: Secondary | ICD-10-CM | POA: Diagnosis not present

## 2021-09-08 DIAGNOSIS — N183 Chronic kidney disease, stage 3 unspecified: Secondary | ICD-10-CM | POA: Diagnosis not present

## 2021-09-08 DIAGNOSIS — E785 Hyperlipidemia, unspecified: Secondary | ICD-10-CM | POA: Diagnosis not present

## 2021-09-08 DIAGNOSIS — E1140 Type 2 diabetes mellitus with diabetic neuropathy, unspecified: Secondary | ICD-10-CM | POA: Diagnosis not present

## 2021-09-08 DIAGNOSIS — S72041A Displaced fracture of base of neck of right femur, initial encounter for closed fracture: Secondary | ICD-10-CM | POA: Diagnosis not present

## 2021-09-08 DIAGNOSIS — Z471 Aftercare following joint replacement surgery: Secondary | ICD-10-CM | POA: Diagnosis not present

## 2021-09-08 DIAGNOSIS — M25551 Pain in right hip: Secondary | ICD-10-CM | POA: Diagnosis not present

## 2021-09-08 DIAGNOSIS — K219 Gastro-esophageal reflux disease without esophagitis: Secondary | ICD-10-CM | POA: Diagnosis not present

## 2021-09-08 DIAGNOSIS — Z48815 Encounter for surgical aftercare following surgery on the digestive system: Secondary | ICD-10-CM | POA: Diagnosis not present

## 2021-09-08 DIAGNOSIS — M47816 Spondylosis without myelopathy or radiculopathy, lumbar region: Secondary | ICD-10-CM | POA: Diagnosis not present

## 2021-09-08 DIAGNOSIS — Z79899 Other long term (current) drug therapy: Secondary | ICD-10-CM | POA: Diagnosis not present

## 2021-09-08 DIAGNOSIS — N179 Acute kidney failure, unspecified: Secondary | ICD-10-CM | POA: Diagnosis not present

## 2021-09-08 DIAGNOSIS — I509 Heart failure, unspecified: Secondary | ICD-10-CM | POA: Diagnosis not present

## 2021-09-08 DIAGNOSIS — R41841 Cognitive communication deficit: Secondary | ICD-10-CM | POA: Diagnosis not present

## 2021-09-08 DIAGNOSIS — M6281 Muscle weakness (generalized): Secondary | ICD-10-CM | POA: Diagnosis not present

## 2021-09-08 DIAGNOSIS — I10 Essential (primary) hypertension: Secondary | ICD-10-CM | POA: Diagnosis not present

## 2021-09-08 DIAGNOSIS — R531 Weakness: Secondary | ICD-10-CM | POA: Diagnosis not present

## 2021-09-08 DIAGNOSIS — W1839XA Other fall on same level, initial encounter: Secondary | ICD-10-CM | POA: Diagnosis not present

## 2021-09-08 DIAGNOSIS — S72051A Unspecified fracture of head of right femur, initial encounter for closed fracture: Secondary | ICD-10-CM | POA: Diagnosis not present

## 2021-09-10 DIAGNOSIS — G309 Alzheimer's disease, unspecified: Secondary | ICD-10-CM | POA: Diagnosis not present

## 2021-09-10 DIAGNOSIS — Z48815 Encounter for surgical aftercare following surgery on the digestive system: Secondary | ICD-10-CM | POA: Diagnosis not present

## 2021-09-10 DIAGNOSIS — I509 Heart failure, unspecified: Secondary | ICD-10-CM | POA: Diagnosis not present

## 2021-09-10 DIAGNOSIS — K219 Gastro-esophageal reflux disease without esophagitis: Secondary | ICD-10-CM | POA: Diagnosis not present

## 2021-09-10 DIAGNOSIS — F0280 Dementia in other diseases classified elsewhere without behavioral disturbance: Secondary | ICD-10-CM | POA: Diagnosis not present

## 2021-09-10 DIAGNOSIS — I110 Hypertensive heart disease with heart failure: Secondary | ICD-10-CM | POA: Diagnosis not present

## 2021-09-10 DIAGNOSIS — M810 Age-related osteoporosis without current pathological fracture: Secondary | ICD-10-CM | POA: Diagnosis not present

## 2021-09-10 DIAGNOSIS — E119 Type 2 diabetes mellitus without complications: Secondary | ICD-10-CM | POA: Diagnosis not present

## 2021-09-11 ENCOUNTER — Ambulatory Visit: Payer: Medicare PPO | Admitting: Endocrinology

## 2021-09-11 DIAGNOSIS — R2689 Other abnormalities of gait and mobility: Secondary | ICD-10-CM | POA: Diagnosis not present

## 2021-09-11 DIAGNOSIS — S72001A Fracture of unspecified part of neck of right femur, initial encounter for closed fracture: Secondary | ICD-10-CM | POA: Diagnosis not present

## 2021-09-11 DIAGNOSIS — G309 Alzheimer's disease, unspecified: Secondary | ICD-10-CM | POA: Diagnosis not present

## 2021-09-11 DIAGNOSIS — Z9181 History of falling: Secondary | ICD-10-CM | POA: Diagnosis not present

## 2021-09-11 DIAGNOSIS — R279 Unspecified lack of coordination: Secondary | ICD-10-CM | POA: Diagnosis not present

## 2021-09-11 DIAGNOSIS — R41841 Cognitive communication deficit: Secondary | ICD-10-CM | POA: Diagnosis not present

## 2021-09-11 DIAGNOSIS — D649 Anemia, unspecified: Secondary | ICD-10-CM | POA: Diagnosis not present

## 2021-09-11 DIAGNOSIS — E1140 Type 2 diabetes mellitus with diabetic neuropathy, unspecified: Secondary | ICD-10-CM | POA: Diagnosis not present

## 2021-09-11 DIAGNOSIS — R531 Weakness: Secondary | ICD-10-CM | POA: Diagnosis not present

## 2021-09-11 DIAGNOSIS — M6281 Muscle weakness (generalized): Secondary | ICD-10-CM | POA: Diagnosis not present

## 2021-09-11 DIAGNOSIS — F02A0 Dementia in other diseases classified elsewhere, mild, without behavioral disturbance, psychotic disturbance, mood disturbance, and anxiety: Secondary | ICD-10-CM | POA: Diagnosis not present

## 2021-09-11 DIAGNOSIS — E1122 Type 2 diabetes mellitus with diabetic chronic kidney disease: Secondary | ICD-10-CM | POA: Diagnosis not present

## 2021-09-11 DIAGNOSIS — Z7401 Bed confinement status: Secondary | ICD-10-CM | POA: Diagnosis not present

## 2021-09-11 DIAGNOSIS — F32A Depression, unspecified: Secondary | ICD-10-CM | POA: Diagnosis not present

## 2021-09-11 DIAGNOSIS — G300 Alzheimer's disease with early onset: Secondary | ICD-10-CM | POA: Diagnosis not present

## 2021-09-11 DIAGNOSIS — D631 Anemia in chronic kidney disease: Secondary | ICD-10-CM | POA: Diagnosis not present

## 2021-09-11 DIAGNOSIS — I10 Essential (primary) hypertension: Secondary | ICD-10-CM | POA: Diagnosis not present

## 2021-09-11 DIAGNOSIS — F411 Generalized anxiety disorder: Secondary | ICD-10-CM | POA: Diagnosis not present

## 2021-09-11 DIAGNOSIS — W19XXXA Unspecified fall, initial encounter: Secondary | ICD-10-CM | POA: Diagnosis not present

## 2021-09-11 DIAGNOSIS — S72001D Fracture of unspecified part of neck of right femur, subsequent encounter for closed fracture with routine healing: Secondary | ICD-10-CM | POA: Diagnosis not present

## 2021-09-11 DIAGNOSIS — Z794 Long term (current) use of insulin: Secondary | ICD-10-CM | POA: Diagnosis not present

## 2021-09-11 DIAGNOSIS — M25551 Pain in right hip: Secondary | ICD-10-CM | POA: Diagnosis not present

## 2021-09-11 DIAGNOSIS — I129 Hypertensive chronic kidney disease with stage 1 through stage 4 chronic kidney disease, or unspecified chronic kidney disease: Secondary | ICD-10-CM | POA: Diagnosis not present

## 2021-09-11 DIAGNOSIS — N183 Chronic kidney disease, stage 3 unspecified: Secondary | ICD-10-CM | POA: Diagnosis not present

## 2021-09-13 DIAGNOSIS — S72001D Fracture of unspecified part of neck of right femur, subsequent encounter for closed fracture with routine healing: Secondary | ICD-10-CM | POA: Diagnosis not present

## 2021-09-13 DIAGNOSIS — I10 Essential (primary) hypertension: Secondary | ICD-10-CM | POA: Diagnosis not present

## 2021-09-17 ENCOUNTER — Ambulatory Visit: Payer: Medicare PPO | Admitting: General Surgery

## 2021-09-25 DIAGNOSIS — M25551 Pain in right hip: Secondary | ICD-10-CM | POA: Diagnosis not present

## 2021-09-28 DIAGNOSIS — R41841 Cognitive communication deficit: Secondary | ICD-10-CM | POA: Diagnosis not present

## 2021-09-28 DIAGNOSIS — R2689 Other abnormalities of gait and mobility: Secondary | ICD-10-CM | POA: Diagnosis not present

## 2021-09-28 DIAGNOSIS — S72001D Fracture of unspecified part of neck of right femur, subsequent encounter for closed fracture with routine healing: Secondary | ICD-10-CM | POA: Diagnosis not present

## 2021-09-28 DIAGNOSIS — M6281 Muscle weakness (generalized): Secondary | ICD-10-CM | POA: Diagnosis not present

## 2021-09-29 DIAGNOSIS — Z6823 Body mass index (BMI) 23.0-23.9, adult: Secondary | ICD-10-CM | POA: Diagnosis not present

## 2021-09-29 DIAGNOSIS — R109 Unspecified abdominal pain: Secondary | ICD-10-CM | POA: Diagnosis not present

## 2021-09-30 DIAGNOSIS — S72001D Fracture of unspecified part of neck of right femur, subsequent encounter for closed fracture with routine healing: Secondary | ICD-10-CM | POA: Diagnosis not present

## 2021-09-30 DIAGNOSIS — R2689 Other abnormalities of gait and mobility: Secondary | ICD-10-CM | POA: Diagnosis not present

## 2021-09-30 DIAGNOSIS — R41841 Cognitive communication deficit: Secondary | ICD-10-CM | POA: Diagnosis not present

## 2021-09-30 DIAGNOSIS — M6281 Muscle weakness (generalized): Secondary | ICD-10-CM | POA: Diagnosis not present

## 2021-10-01 DIAGNOSIS — R2689 Other abnormalities of gait and mobility: Secondary | ICD-10-CM | POA: Diagnosis not present

## 2021-10-01 DIAGNOSIS — R41841 Cognitive communication deficit: Secondary | ICD-10-CM | POA: Diagnosis not present

## 2021-10-01 DIAGNOSIS — S72001D Fracture of unspecified part of neck of right femur, subsequent encounter for closed fracture with routine healing: Secondary | ICD-10-CM | POA: Diagnosis not present

## 2021-10-01 DIAGNOSIS — M6281 Muscle weakness (generalized): Secondary | ICD-10-CM | POA: Diagnosis not present

## 2021-10-02 DIAGNOSIS — R2689 Other abnormalities of gait and mobility: Secondary | ICD-10-CM | POA: Diagnosis not present

## 2021-10-02 DIAGNOSIS — S72001D Fracture of unspecified part of neck of right femur, subsequent encounter for closed fracture with routine healing: Secondary | ICD-10-CM | POA: Diagnosis not present

## 2021-10-02 DIAGNOSIS — R41841 Cognitive communication deficit: Secondary | ICD-10-CM | POA: Diagnosis not present

## 2021-10-02 DIAGNOSIS — M6281 Muscle weakness (generalized): Secondary | ICD-10-CM | POA: Diagnosis not present

## 2021-10-03 DIAGNOSIS — R2689 Other abnormalities of gait and mobility: Secondary | ICD-10-CM | POA: Diagnosis not present

## 2021-10-03 DIAGNOSIS — M6281 Muscle weakness (generalized): Secondary | ICD-10-CM | POA: Diagnosis not present

## 2021-10-03 DIAGNOSIS — S72001D Fracture of unspecified part of neck of right femur, subsequent encounter for closed fracture with routine healing: Secondary | ICD-10-CM | POA: Diagnosis not present

## 2021-10-03 DIAGNOSIS — R41841 Cognitive communication deficit: Secondary | ICD-10-CM | POA: Diagnosis not present

## 2021-10-04 DIAGNOSIS — R41841 Cognitive communication deficit: Secondary | ICD-10-CM | POA: Diagnosis not present

## 2021-10-04 DIAGNOSIS — S72001D Fracture of unspecified part of neck of right femur, subsequent encounter for closed fracture with routine healing: Secondary | ICD-10-CM | POA: Diagnosis not present

## 2021-10-04 DIAGNOSIS — M6281 Muscle weakness (generalized): Secondary | ICD-10-CM | POA: Diagnosis not present

## 2021-10-04 DIAGNOSIS — R2689 Other abnormalities of gait and mobility: Secondary | ICD-10-CM | POA: Diagnosis not present

## 2021-10-07 ENCOUNTER — Ambulatory Visit (HOSPITAL_COMMUNITY): Payer: Medicare PPO

## 2021-10-07 DIAGNOSIS — R2689 Other abnormalities of gait and mobility: Secondary | ICD-10-CM | POA: Diagnosis not present

## 2021-10-07 DIAGNOSIS — R41841 Cognitive communication deficit: Secondary | ICD-10-CM | POA: Diagnosis not present

## 2021-10-07 DIAGNOSIS — M6281 Muscle weakness (generalized): Secondary | ICD-10-CM | POA: Diagnosis not present

## 2021-10-07 DIAGNOSIS — S72001D Fracture of unspecified part of neck of right femur, subsequent encounter for closed fracture with routine healing: Secondary | ICD-10-CM | POA: Diagnosis not present

## 2021-10-08 DIAGNOSIS — M6281 Muscle weakness (generalized): Secondary | ICD-10-CM | POA: Diagnosis not present

## 2021-10-08 DIAGNOSIS — R41841 Cognitive communication deficit: Secondary | ICD-10-CM | POA: Diagnosis not present

## 2021-10-08 DIAGNOSIS — R2689 Other abnormalities of gait and mobility: Secondary | ICD-10-CM | POA: Diagnosis not present

## 2021-10-08 DIAGNOSIS — S72001D Fracture of unspecified part of neck of right femur, subsequent encounter for closed fracture with routine healing: Secondary | ICD-10-CM | POA: Diagnosis not present

## 2021-10-09 DIAGNOSIS — S72001D Fracture of unspecified part of neck of right femur, subsequent encounter for closed fracture with routine healing: Secondary | ICD-10-CM | POA: Diagnosis not present

## 2021-10-09 DIAGNOSIS — M6281 Muscle weakness (generalized): Secondary | ICD-10-CM | POA: Diagnosis not present

## 2021-10-09 DIAGNOSIS — R41841 Cognitive communication deficit: Secondary | ICD-10-CM | POA: Diagnosis not present

## 2021-10-09 DIAGNOSIS — R2689 Other abnormalities of gait and mobility: Secondary | ICD-10-CM | POA: Diagnosis not present

## 2021-10-10 DIAGNOSIS — R2689 Other abnormalities of gait and mobility: Secondary | ICD-10-CM | POA: Diagnosis not present

## 2021-10-10 DIAGNOSIS — M6281 Muscle weakness (generalized): Secondary | ICD-10-CM | POA: Diagnosis not present

## 2021-10-10 DIAGNOSIS — R41841 Cognitive communication deficit: Secondary | ICD-10-CM | POA: Diagnosis not present

## 2021-10-10 DIAGNOSIS — S72001D Fracture of unspecified part of neck of right femur, subsequent encounter for closed fracture with routine healing: Secondary | ICD-10-CM | POA: Diagnosis not present

## 2021-10-15 DIAGNOSIS — Z8781 Personal history of (healed) traumatic fracture: Secondary | ICD-10-CM | POA: Diagnosis not present

## 2021-10-15 DIAGNOSIS — I10 Essential (primary) hypertension: Secondary | ICD-10-CM | POA: Diagnosis not present

## 2021-10-15 DIAGNOSIS — Z6823 Body mass index (BMI) 23.0-23.9, adult: Secondary | ICD-10-CM | POA: Diagnosis not present

## 2021-10-15 DIAGNOSIS — D649 Anemia, unspecified: Secondary | ICD-10-CM | POA: Diagnosis not present

## 2021-10-15 DIAGNOSIS — E7800 Pure hypercholesterolemia, unspecified: Secondary | ICD-10-CM | POA: Diagnosis not present

## 2021-10-15 DIAGNOSIS — I700 Atherosclerosis of aorta: Secondary | ICD-10-CM | POA: Diagnosis not present

## 2021-10-15 DIAGNOSIS — Z6824 Body mass index (BMI) 24.0-24.9, adult: Secondary | ICD-10-CM | POA: Diagnosis not present

## 2021-10-15 DIAGNOSIS — E1165 Type 2 diabetes mellitus with hyperglycemia: Secondary | ICD-10-CM | POA: Diagnosis not present

## 2021-10-15 DIAGNOSIS — R413 Other amnesia: Secondary | ICD-10-CM | POA: Diagnosis not present

## 2021-10-24 DIAGNOSIS — M25551 Pain in right hip: Secondary | ICD-10-CM | POA: Diagnosis not present

## 2021-11-03 ENCOUNTER — Other Ambulatory Visit: Payer: Self-pay | Admitting: Neurology

## 2021-11-04 DIAGNOSIS — R300 Dysuria: Secondary | ICD-10-CM | POA: Diagnosis not present

## 2021-11-05 MED ORDER — DONEPEZIL HCL 10 MG PO TABS: 10.0000 mg | ORAL_TABLET | Freq: Every day | ORAL | 0 refills | Status: DC

## 2021-11-05 NOTE — Addendum Note (Signed)
Addended by: Georgiann Cocker on: 11/05/2021 08:26 AM   Modules accepted: Orders

## 2021-11-10 DIAGNOSIS — R413 Other amnesia: Secondary | ICD-10-CM | POA: Diagnosis not present

## 2021-11-10 DIAGNOSIS — E1165 Type 2 diabetes mellitus with hyperglycemia: Secondary | ICD-10-CM | POA: Diagnosis not present

## 2021-11-10 DIAGNOSIS — I700 Atherosclerosis of aorta: Secondary | ICD-10-CM | POA: Diagnosis not present

## 2021-11-10 DIAGNOSIS — I10 Essential (primary) hypertension: Secondary | ICD-10-CM | POA: Diagnosis not present

## 2021-11-10 DIAGNOSIS — M21372 Foot drop, left foot: Secondary | ICD-10-CM | POA: Diagnosis not present

## 2021-11-10 DIAGNOSIS — E7801 Familial hypercholesterolemia: Secondary | ICD-10-CM | POA: Diagnosis not present

## 2021-11-10 DIAGNOSIS — Z8781 Personal history of (healed) traumatic fracture: Secondary | ICD-10-CM | POA: Diagnosis not present

## 2021-11-10 DIAGNOSIS — D649 Anemia, unspecified: Secondary | ICD-10-CM | POA: Diagnosis not present

## 2021-11-22 ENCOUNTER — Telehealth: Payer: Medicare PPO | Admitting: Physician Assistant

## 2021-11-22 ENCOUNTER — Telehealth: Payer: Self-pay | Admitting: Physician Assistant

## 2021-11-22 DIAGNOSIS — I10 Essential (primary) hypertension: Secondary | ICD-10-CM | POA: Diagnosis not present

## 2021-11-22 DIAGNOSIS — Z6824 Body mass index (BMI) 24.0-24.9, adult: Secondary | ICD-10-CM | POA: Diagnosis not present

## 2021-11-22 NOTE — Telephone Encounter (Signed)
Good Morning [name] [name],  Patients daughter called and stated that patient would not able to do her Mychart visit with you this morning at 11:15.  Patient was rescheduled for 3/14 at 11:00 in office appointment.

## 2021-11-27 DIAGNOSIS — R300 Dysuria: Secondary | ICD-10-CM | POA: Diagnosis not present

## 2021-11-29 ENCOUNTER — Telehealth: Payer: Self-pay | Admitting: Physician Assistant

## 2021-11-29 DIAGNOSIS — I10 Essential (primary) hypertension: Secondary | ICD-10-CM | POA: Diagnosis not present

## 2021-11-29 DIAGNOSIS — Z6824 Body mass index (BMI) 24.0-24.9, adult: Secondary | ICD-10-CM | POA: Diagnosis not present

## 2021-11-29 MED ORDER — COLESTIPOL HCL 1 G PO TABS: 2.0000 g | ORAL_TABLET | Freq: Two times a day (BID) | ORAL | 2 refills | Status: DC

## 2021-11-29 NOTE — Telephone Encounter (Signed)
Inbound call from patient daughter [name]. States have chronic diarrhea and need to know what they can do before her upcoming appt as she has been worse these last couple weeks. Best [phone] ?

## 2021-11-29 NOTE — Telephone Encounter (Signed)
She could either (1) repeat Xifaxan 550 mg TID x 14 days (this worked in the past) or (2) increase the colestipol to 2g twice daily. I would not do both between now and her upcoming appointment.  ?Thanks.  ?KLB   ? ?Called and spoke with [name] regarding Dr. Payton Emerald recommendations. She would like to try the Colestipol 2 g BID. She asked that I send a new prescription to [facility]. [name] will call back if the patient is not improving. [name] verbalized understanding of all information and had no concerns at the end of the call. ?

## 2021-11-29 NOTE — Telephone Encounter (Signed)
Returned call to [name]. She reports that the patient has been dealing with diarrhea for months but it has worsened within the last couple of weeks. [name] reports that pt woke up with stool up her back and her father had to place pads to lead her to the bathroom. She reports that she has been incontinent and it only seems to happen in the mornings. [name] reports that pt has still been taking Colestipol 1 g BID and it used to work but she is still having symptoms despite that. [name] reports that her father gave the pt Imodium this AM but she is not sure how the patient responded to it. Pt is taking Florastor. No fiber supplement at this time. No abdominal pain or fever. I told her that the pt may need stool studies but we will check. Dr. Tarri Glenn, pt is scheduled for a f/u appt with Anderson Malta on 3/14. Anderson Malta is out of the office today. Any recommendations prior to her appt? Please advise, thanks.  ?

## 2021-12-02 ENCOUNTER — Other Ambulatory Visit: Payer: Self-pay | Admitting: Gastroenterology

## 2021-12-02 MED ORDER — RIFAXIMIN 550 MG PO TABS: 550.0000 mg | ORAL_TABLET | Freq: Three times a day (TID) | ORAL | 0 refills | Status: DC

## 2021-12-02 NOTE — Addendum Note (Signed)
Addended by: Yevette Edwards on: 12/02/2021 08:49 AM ? ? Modules accepted: Orders ? ?

## 2021-12-02 NOTE — Telephone Encounter (Signed)
Patient's daughter, [name], called back this morning stating her mom was worse over the weekend and would like to have a prescription of Xifaxin called in.  She is also questioning if patient should still be taking the double dose of Colestipol (2 twice a day).  Please call [name] at [phone].  Thank you. ?

## 2021-12-02 NOTE — Telephone Encounter (Signed)
Returned call to [place]. [name] reported that pt was doing better over the weekend. Yesterday morning was good and pt was able to go to church. Pt's husband reported to [name] that patient had diarrhea overnight and it was everywhere. He is having to throw away several items. [name] would like for [place] to send in the prescription for Xifaxan. She is aware that this may take a few days to get authorization through the patient's insurance. [name] is aware that once patient start Xifaxan she will need to discontinue Colestipol. [name] will have pt continue Colestipol until the Xifaxan is filled. [name] verbalized understanding and had no concerns at the end of the call.  ? ?Xifaxan prescription, records, demographic, and insurance information has been sent to [facility].  ?

## 2021-12-03 MED ORDER — RIFAXIMIN 550 MG PO TABS: 550.0000 mg | ORAL_TABLET | Freq: Three times a day (TID) | ORAL | 0 refills | Status: DC

## 2021-12-03 NOTE — Telephone Encounter (Signed)
Returned call to [place]. She wanted Xifaxan sent to [facility], she stated that they are desperate. Pt is having incontinence of feces and is soiling clothes and furniture. She states that they have been pumping her with Imodium. [name] is aware that [employer] is in the middle of working on the [place] and they would set pt up for patient assistance if it is too expensive. [name] stated that they were desperate and have picked it up from [facility] before. I told her that I will send the RX now and to call the pharmacy in about an hour and see what they say about getting it filled. [name] verbalized understanding and had no other concerns at the end of the call. ?

## 2021-12-03 NOTE — Addendum Note (Signed)
Addended by: Yevette Edwards on: 12/03/2021 02:36 PM ? ? Modules accepted: Orders ? ?

## 2021-12-03 NOTE — Telephone Encounter (Signed)
Patient's daughter calling back requesting for script to be sent to [facility].  Please advise.  ?

## 2021-12-04 MED ORDER — RIFAXIMIN 550 MG PO TABS
550.0000 mg | ORAL_TABLET | Freq: Three times a day (TID) | ORAL | 0 refills | Status: AC
Start: 2021-12-04 — End: 2021-12-18

## 2021-12-04 NOTE — Telephone Encounter (Signed)
Received a call from [name] stating that the [place] needs to be approved by our office. I told her that we sent in the new prescription so it is more than likely needing a PA. [name] stated that she received a text from the pharmacy yesterday stating that they have sent [place] a PA request. I checked and no PA was received at the office. [name] stated that the pt has been experiencing this diarrhea for almost a week now and they may have to take her to the ED because she might be getting dehydrated. I told her that I will check on this and see what I can do.  ? ?I called [facility] and spoke with Judson Roch, tech, she provided me the [facility] key over the phone since we did not receive the fax. PA has been submitted as urgent, estimated turn around time for a decision would be 3-7 days. ? ?I called [name] back and let her know the turn around time for a response from patient's insurance, she said the "patient would be dead by then". I told her if she is having that much diarrhea she will need to be evaluated at an ER or UC. [name] stated that she didn't think the UC in [place] would do stool test or anything. I recommended [facility] location due to shorter wait time, [name] stated that it would take them an hour to get there. She questioned why the Xifaxan needed a PA and the patient received it before, I tried to explain to her that this is probably not on the patient's insurance formulary. I told her that I don't think she should wait that long. She stated that they have been pumping her with Imodium. I asked her how much Imodium they were giving her and she stated that she didn't know for sure and would have to ask her dad. She states that he gave her one last night, I told [name] to have the pt's husband give her 2 Imodium this morning and she can take 2 more throughout the day as needed. [name] stated that the pt has an appt with [place] next week, I checked the schedule and we do not have any sooner appts. [name] stated that pt did better at first  with the Colestipol 2g BID but that only lasted a couple of days.  [name] knows that we will be in contact with her once we have a response from the insurance company. ?

## 2021-12-04 NOTE — Addendum Note (Signed)
Addended by: Yevette Edwards on: 12/04/2021 10:20 AM ? ? Modules accepted: Orders ? ?

## 2021-12-04 NOTE — Telephone Encounter (Signed)
Dr. Tarri Glenn, please disregard request.  ? ?Pt's insurance APPROVED the [facility] prescription. I have sent a new  RX with approval information. I called [name] and provided her the approval information as well. She will follow up with the pharmacy to see when the RX will be ready for pick up. [name] was very glad to hear this information. ?

## 2021-12-04 NOTE — Telephone Encounter (Signed)
Thanks. ? ?KLB ?

## 2021-12-10 ENCOUNTER — Ambulatory Visit: Payer: Medicare PPO | Admitting: Physician Assistant

## 2021-12-10 ENCOUNTER — Encounter: Payer: Self-pay | Admitting: Physician Assistant

## 2021-12-10 VITALS — BP 122/78 | HR 72 | Ht 63.0 in | Wt 136.6 lb

## 2021-12-10 DIAGNOSIS — K529 Noninfective gastroenteritis and colitis, unspecified: Secondary | ICD-10-CM

## 2021-12-10 DIAGNOSIS — K862 Cyst of pancreas: Secondary | ICD-10-CM | POA: Diagnosis not present

## 2021-12-10 MED ORDER — COLESTIPOL HCL 1 G PO TABS: 1.0000 g | ORAL_TABLET | Freq: Two times a day (BID) | ORAL | 3 refills | Status: DC

## 2021-12-10 NOTE — Progress Notes (Signed)
? ?Chief Complaint: Diarrhea ? ?HPI: ?   [name] [name] is an 86 year old female with a past medical history of chronic diarrhea, reflux and multiple others listed below, known to Dr. Tarri Glenn, who presents to clinic today with a complaint of diarrhea. ?   08/01/2020 EGD with gastritis, duodenal diverticulum.  Biopsies showed reactive gastropathy with mild chronic inflammation. ?   08/20/2020 stable small cystic lesion in the pancreatic head, unchanged from recent CT. ?   09/11/2020 patient seen in clinic for follow-up of chronic diarrhea.  Work-up at that time included normal TSH, normal celiac testing, pancreatic elastase and GI pathogen panel, CBC with hemoglobin of 10.5.  CT abdomen pelvis with contrast 07/18/20 with prominent soft tissue of the posterior aspect of the upper to mid stomach, small right renal angiomyolipoma, minimal intrahepatic biliary dilation, 11 x 8 x 8 mm cystic lesion of the pancreatic head, recommend follow-up pre and postcontrast MRI/MRCP or pancreatic protocol CT in 2 years.  At that time diarrhea had resolved after completing 14 days of Xifaxan using colestipol 1 g twice daily.  At that time as discussed she had normocytic anemia without overt bleeding.  Labs 07/09/2020 iron 56, ferritin 47.5. ?   01/29/2021 patient saw PCP and at that time was discussed she had chronic anemia.  It was noted that her hemoglobin was 9.3 on 4/22 and a year ago it was 10. ?   01/23/2021 CMP with a glucose elevated at 140, creatinine 1.04, normal BUN.  CBC with a hemoglobin of 9.3, MCV normal. ?   01/29/2021 iron panel was normal.  Repeat CBC with hemoglobin of 8.6, MCV normal.  Ferritin 58.  Reticulocyte count normal.  At that time they were told to stop aspirin for the patient and be referred to our office. ?    02/06/2021 patient seen in clinic for her anemia.  At that time discussed the neck step in work-up would be a colonoscopy given that she had recently had an EGD with normal maladies.  Patient did not want  another colonoscopy.  We rechecked a CBC and referred her to a hematologist instead.  Refilled Colestipol. ?   10/08/2021 CBC with a hemoglobin of 7.2. +FIT test.  Just after hip surgery.  Has since improved per family. ?   11/29/2021 patient's daughter called and described incontinence of stool overnight.  She was told to increase Colestipol to 2 g twice daily to see if this helped, it worked for a few days but then she had another incontinent stool which was hard for her husband to clean up.  She wanted to trial some Xifaxan which eventually got approved through the insurance on 12/04/2021. ?   Today, patient presents to clinic with her daughter and husband who assists with her history as they are her caretakers.  They explained that about a day or 2 after restarting the Xifaxan 550 3 times daily the diarrhea has dramatically improved.  She was previously incontinent at night and now has maybe 1 or 2 loose stools a day or sometimes does not have any liquid bowel movements.  They describe that the stool did look somewhat "black" at one point but this has since cleared up.  She still has a week left of the Xifaxan.  Along with this daughter also stopped Pepcid as she read somewhere that a small side effect could be diarrhea and she feels like this may have helped as well, although it was done at the same time she started Xifaxan.  They are currently not taking Colestipol and ask about this when they are finished with this medication.  They asked for refills.  Overall patient is much improved and thankful. ?   Denies fever, chills, weight loss or blood in her stool. ? ? ?Prior endoscopic history: ?- Colonoscopy with Dr. Britta Mccreedy 11/28/2015 revealed a sigmoid colon semipedunculated hyperplastic polyp.  Random colon biopsies were normal.  There was no evidence of lymphocytic or collagenous colitis ?- Colonoscopy with Dr. Gala Romney in 2019 revealed diverticulosis and internal hemorrhoids.  Biopsies from the right and left colon were  normal. ?- EGD 08/01/20: Gastritis, duodenal diverticulum. Biopsies showed reactive gastropathy with mild chronic inflammation. No H pylori.  ? ?Past Medical History:  ?Diagnosis Date  ? Anemia   ? Anxiety   ? Arthritis   ? Chronic back pain   ? Chronic diarrhea   ? Diabetes ([place])   ? Diverticulosis   ? GERD (gastroesophageal reflux disease)   ? Hyperlipidemia   ? Hypertension   ? Internal hemorrhoids   ? UTI (urinary tract infection)   ? ? ?Past Surgical History:  ?Procedure Laterality Date  ? ABDOMINAL HYSTERECTOMY    ? CARDIAC CATHETERIZATION    ? CATARACT EXTRACTION, BILATERAL    ? CHOLECYSTECTOMY    ? COLONOSCOPY  11/2015  ? Dr. Britta Mccreedy: hyperplastic polyp from sigmoid colon. biopsies from ascending colon were negative for microscopic colitis.   ? COLONOSCOPY N/A 03/31/2018  ? Dr. Gala Romney: Diverticulosis, random colon biopsies negative, hemorrhoids.  No future screening or surveillance colonoscopy is recommended.  ? LAPAROTOMY N/A 08/08/2021  ? Procedure: EXPLORATORY LAPAROTOMY,  OVERSEW PERFORATED SMALL BOWEL;  Surgeon: Virl Cagey, MD;  Location: [facility];  Service: General;  Laterality: N/A;  ? TONSILLECTOMY    ? TRIGGER FINGER RELEASE    ? ? ?Current Outpatient Medications  ?Medication Sig Dispense Refill  ? ACCU-CHEK GUIDE test strip USE AS DIRECTED THREE TIMES DAILY 150 strip 3  ? atorvastatin (LIPITOR) 40 MG tablet Take 20 mg by mouth daily.     ? Calcium Citrate-Vitamin D 200-250 MG-UNIT TABS Take 1 tablet by mouth daily.    ? colestipol (COLESTID) 1 g tablet Take 2 tablets (2 g total) by mouth 2 (two) times daily. 120 tablet 2  ? COMFORT EZ PEN NEEDLES 31G X 5 MM MISC     ? Cyanocobalamin (B-12) 1000 MCG TABS Take 1,000 mcg by mouth daily.     ? diclofenac sodium (VOLTAREN) 1 % GEL Apply 2 g topically at bedtime.    ? donepezil (ARICEPT) 10 MG tablet Take 1 tablet (10 mg total) by mouth at bedtime. Please call and make overdue appt for further refills. 1st attempt 30 tablet 0  ? DULoxetine (CYMBALTA) 60  MG capsule Take 60 mg by mouth daily.    ? enalapril (VASOTEC) 20 MG tablet Take 20 mg by mouth daily.    ? famotidine (PEPCID) 20 MG tablet Take 20 mg by mouth daily.    ? ferrous sulfate 325 (65 FE) MG EC tablet Take 325 mg by mouth daily with breakfast.    ? hydrochlorothiazide (HYDRODIURIL) 12.5 MG tablet Take 12.5 mg by mouth daily.    ? labetalol (NORMODYNE) 200 MG tablet Take 200 mg by mouth 2 (two) times daily.    ? Lactobacillus Rhamnosus, GG, (RA PROBIOTIC DIGESTIVE CARE PO) Take 1 capsule by mouth daily.    ? levothyroxine (SYNTHROID) 25 MCG tablet Take 25 mcg by mouth daily before breakfast.    ?  magnesium oxide (MAG-OX) 400 MG tablet Take 400 mg by mouth daily.    ? Multiple Vitamins-Minerals (CENTRUM) tablet Take 1 tablet by mouth daily.    ? ondansetron (ZOFRAN-ODT) 4 MG disintegrating tablet Take 1 tablet (4 mg total) by mouth every 6 (six) hours as needed for nausea. 20 tablet 0  ? oxyCODONE (OXY IR/ROXICODONE) 5 MG immediate release tablet Take 1 tablet (5 mg total) by mouth every 4 (four) hours as needed for severe pain or breakthrough pain. 20 tablet 0  ? rifaximin (XIFAXAN) 550 MG TABS tablet Take 1 tablet (550 mg total) by mouth 3 (three) times daily for 14 days. PA Case: [id number], Status: Approved, Coverage Starts on: 12/04/2021 12:00:00 AM, Coverage Ends on: 03/06/2022 12:00:00 AM 42 tablet 0  ? sertraline (ZOLOFT) 25 MG tablet Take 25 mg by mouth daily.    ? SOLIQUA 100-33 UNT-MCG/ML SOPN INJECT 22 UNITS SQ DAILY BEFORE BREAKFast (Patient taking differently: Inject 18 Units into the skin daily.) 15 mL 1  ? ?No current facility-administered medications for this visit.  ? ? ?Allergies as of 12/10/2021  ? (No Known Allergies)  ? ? ?Family History  ?Problem Relation Age of Onset  ? Heart attack Sister   ? Transient ischemic attack Sister   ? Heart attack Mother   ? Diabetes Mother   ? Transient ischemic attack Mother   ? Heart attack Father   ? Diabetes Son   ? Diabetes Maternal Aunt   ? Rectal  cancer Other   ?     anal CA  ? Colon cancer Neg Hx   ? Celiac disease Neg Hx   ? Inflammatory bowel disease Neg Hx   ? Stomach cancer Neg Hx   ? ? ?Social History  ? ?Socioeconomic History  ? Marital stat

## 2021-12-10 NOTE — Patient Instructions (Signed)
Finish Xifaxan ? ?When you are done with Xifaxan restart Colestipol 1 g twice a day ? ?Please dont forget to contact radiology to schedule your MRI/MRCP.  ? ?We have sent the following medications to your pharmacy for you to pick up at your convenience: Colestipol ? ?Follow up as needed ? ?If you are age 84 or older, your body mass index should be between 23-30. Your Body mass index is 24.2 kg/m?Marland Kitchen If this is out of the aforementioned range listed, please consider follow up with your Primary Care Provider. ? ?If you are age 31 or younger, your body mass index should be between 19-25. Your Body mass index is 24.2 kg/m?Marland Kitchen If this is out of the aformentioned range listed, please consider follow up with your Primary Care Provider.  ? ?________________________________________________________ ? ?The [facility] providers would like to encourage you to use [facility] to communicate with providers for non-urgent requests or questions.  Due to long hold times on the telephone, sending your provider a message by [facility] may be a faster and more efficient way to get a response.  Please allow 48 business hours for a response.  Please remember that this is for non-urgent requests.  ?_______________________________________________________  ? ?I appreciate the  opportunity to care for you ? ?Thank You  ? ?Lovett Calender  ?

## 2021-12-11 NOTE — Progress Notes (Signed)
Reviewed and agree with management plans. ? ?Zoila Ditullio L. Tryton Bodi, MD, MPH  ?

## 2021-12-12 DIAGNOSIS — Z09 Encounter for follow-up examination after completed treatment for conditions other than malignant neoplasm: Secondary | ICD-10-CM | POA: Diagnosis not present

## 2021-12-12 DIAGNOSIS — M25551 Pain in right hip: Secondary | ICD-10-CM | POA: Diagnosis not present

## 2021-12-16 DIAGNOSIS — I10 Essential (primary) hypertension: Secondary | ICD-10-CM | POA: Diagnosis not present

## 2021-12-16 DIAGNOSIS — Z6824 Body mass index (BMI) 24.0-24.9, adult: Secondary | ICD-10-CM | POA: Diagnosis not present

## 2021-12-16 DIAGNOSIS — M25569 Pain in unspecified knee: Secondary | ICD-10-CM | POA: Diagnosis not present

## 2021-12-16 DIAGNOSIS — R197 Diarrhea, unspecified: Secondary | ICD-10-CM | POA: Diagnosis not present

## 2021-12-25 ENCOUNTER — Ambulatory Visit: Payer: Medicare PPO | Admitting: Physician Assistant

## 2021-12-25 ENCOUNTER — Other Ambulatory Visit: Payer: Self-pay

## 2021-12-25 VITALS — BP 163/69 | HR 70 | Ht 63.0 in | Wt 132.4 lb

## 2021-12-25 DIAGNOSIS — N3000 Acute cystitis without hematuria: Secondary | ICD-10-CM | POA: Diagnosis not present

## 2021-12-25 DIAGNOSIS — N3941 Urge incontinence: Secondary | ICD-10-CM | POA: Diagnosis not present

## 2021-12-25 DIAGNOSIS — N3080 Other cystitis without hematuria: Secondary | ICD-10-CM | POA: Diagnosis not present

## 2021-12-25 LAB — URINALYSIS, ROUTINE W REFLEX MICROSCOPIC
Bilirubin, UA: NEGATIVE
Glucose, UA: NEGATIVE
Ketones, UA: NEGATIVE
Nitrite, UA: NEGATIVE
RBC, UA: NEGATIVE
Specific Gravity, UA: 1.025 (ref 1.005–1.030)
Urobilinogen, Ur: 0.2 mg/dL (ref 0.2–1.0)
pH, UA: 6 (ref 5.0–7.5)

## 2021-12-25 LAB — MICROSCOPIC EXAMINATION: RBC, Urine: NONE SEEN /hpf (ref 0–2)

## 2021-12-25 LAB — BLADDER SCAN AMB NON-IMAGING: Scan Result: 0

## 2021-12-25 MED ORDER — MIRABEGRON ER 25 MG PO TB24: 25.0000 mg | ORAL_TABLET | Freq: Every day | ORAL | 0 refills | Status: DC

## 2021-12-25 MED ORDER — CIPROFLOXACIN HCL 250 MG PO TABS: 250.0000 mg | ORAL_TABLET | Freq: Two times a day (BID) | ORAL | 0 refills | Status: AC

## 2021-12-25 NOTE — Progress Notes (Signed)
post void residual =0mL 

## 2021-12-25 NOTE — Progress Notes (Signed)
? ?12/25/2021 ?9:22 AM  ? ?[name] [name] ?July 15, 1936 ?[id number] ? ? ?Assessment: ? ?1. Acute cystitis without hematuria   ?2. Urge incontinence of urine   ? ?Plan: ?Discussed findings and plan of care at length with the patient and her family members.  Prescription for Cipro for 7 days for current UTI and MDX culture obtained.  Will adjust therapy if indicated by results.  Samples of Myrbetriq 25 mg given and side effects discussed at length.  Timed voiding and voiding diary discussed with the patient's husband who will work with her on this.  She will follow-up in 3 weeks for PVR and urinalysis.  If UTIs recur, may consider cystoscopic exam and bedtime low-dose antibiotic as patient's difficulty with mobility and cognitive function may continue to put her at risk for further infections. ? ?Chief Complaint: No chief complaint on file. ? ? ?Referring provider: Caryl Bis, MD ?[address] ?[place],  [place] [address] ? ? ?History of Present Illness: ? ?[name] [name] is a 86 y.o. year old female with a history of multiple medical problems including chronic diarrhea, hypertension, osteoarthritis, type 2 diabetes, on insulin who is seen in consultation from Caryl Bis, MD  for evaluation of urinary incontinence symptoms.  Patient reports that she has had a difficult time getting to the restroom when she feels the urge to void because she has postop alteration of her gait and is on a rolling walker.  She feels she is too slow to get to the restroom before she voids.  Patient's husband states she is having incontinence during the night on a regular basis with some recent improvement to only 1 issue of wetness during the night.  She is going through 3-7 pads per day since symptoms began. Symptoms have been ongoing for several months specifically starting after she was hospitalized for treatment of perforated bowel.  Soon after discharge, the patient sustained a fall and had to be admitted for hip fracture and was discharged in  January of this year. Medical records from [facility] indicate UTI on 11/04/2021 with positive culture which grew Proteus mirabilis resistant to nitrofurantoin tetracycline and Bactrim.  Patient was treated with Cipro.  Family reports 2 UTIs within the past year, possibly 3.  No history of urinalysis or urine culture noted in patient's medical records.  Today, she continues to complain of urinary frequency, urgency, incontinence but denies any burning, weakness of stream, gross hematuria.  Fever or chills.  No abdominal pain.. ? ?History obtained from the patient, her spouse, and her daughter due to patient's cognitive issues rendering her unable to give a thorough history.  The patient's daughter reports patient is only bathing once per week and often uses the same washcloth to bathe.  His husband usually assist her with ambulation and to the restroom which is down the hall from her bedroom.  They have a bedside toilet but have not utilized it. ?PVR =0 mL ?UA = 11-30 WBCs, many bacteria, nitrite negative, no RBCs. ? ?Past Medical History:  ?Past Medical History:  ?Diagnosis Date  ? Anemia   ? Anxiety   ? Arthritis   ? Chronic back pain   ? Chronic diarrhea   ? Diabetes ([place])   ? Diverticulosis   ? GERD (gastroesophageal reflux disease)   ? Hyperlipidemia   ? Hypertension   ? Internal hemorrhoids   ? UTI (urinary tract infection)   ? ? ?Past Surgical History:  ?Past Surgical History:  ?Procedure  Laterality Date  ? ABDOMINAL HYSTERECTOMY    ? CARDIAC CATHETERIZATION    ? CATARACT EXTRACTION, BILATERAL    ? CHOLECYSTECTOMY    ? COLONOSCOPY  11/2015  ? Dr. Britta Mccreedy: hyperplastic polyp from sigmoid colon. biopsies from ascending colon were negative for microscopic colitis.   ? COLONOSCOPY N/A 03/31/2018  ? Dr. Gala Romney: Diverticulosis, random colon biopsies negative, hemorrhoids.  No future screening or surveillance colonoscopy is recommended.  ? LAPAROTOMY N/A 08/08/2021  ? Procedure: EXPLORATORY LAPAROTOMY,   OVERSEW PERFORATED SMALL BOWEL;  Surgeon: Virl Cagey, MD;  Location: [facility];  Service: General;  Laterality: N/A;  ? PARTIAL HIP ARTHROPLASTY Right   ? TONSILLECTOMY    ? TRIGGER FINGER RELEASE    ? ? ?Allergies:  ?No Known Allergies ? ?Family History:  ?Family History  ?Problem Relation Age of Onset  ? Heart attack Sister   ? Transient ischemic attack Sister   ? Heart attack Mother   ? Diabetes Mother   ? Transient ischemic attack Mother   ? Heart attack Father   ? Diabetes Son   ? Diabetes Maternal Aunt   ? Rectal cancer Other   ?     anal CA  ? Colon cancer Neg Hx   ? Celiac disease Neg Hx   ? Inflammatory bowel disease Neg Hx   ? Stomach cancer Neg Hx   ? ? ?Social History:  ?Social History  ? ?Tobacco Use  ? Smoking status: Never  ? Smokeless tobacco: Never  ?Vaping Use  ? Vaping Use: Never used  ?Substance Use Topics  ? Alcohol use: No  ?  Alcohol/week: 0.0 standard drinks  ? Drug use: No  ? ? ?Review of symptoms:  ?Constitutional:  Negative for unexplained weight loss, night sweats, fever, chills ?ENT:  Negative for nose bleeds, sinus pain, painful swallowing ?CV:  Negative for chest pain, shortness of breath ?Resp:  Negative for cough, wheezing, shortness of breath ?GI:  Negative for nausea, vomiting ?GU:  Positives noted in HPI; otherwise negative for gross hematuria ?Neuro:  Negative for seizures ?Psych: Positive for lack of energy, depression, anxiety ?Endocrine:  Negative for polydipsia, polyuria, symptoms of hypoglycemia (dizziness, hunger, sweating) ?Hematologic:  Negative for anemia, purpura, petechia ? ?Physical Exam: ?BP (!) 163/69   Pulse 70   Ht '5\' 3"'$  (1.6 m)   Wt 132 lb 6.4 oz (60.1 kg)   BMI 23.45 kg/m?   ?Constitutional:  Alert and oriented, No acute distress.  Patient hard of hearing ?HEENT: NCAT, moist mucus membranes.  Trachea midline, no masses. ?Cardiovascular: Regular rate and rhythm without murmur, rub, or gallops ?No clubbing, cyanosis, or edema. ?Respiratory: Normal  respiratory effort, clear to auscultation bilaterally ?GI: Abdomen is soft, nontender, nondistended, no abdominal masses ?BACK:  Non-tender to palpation.  No CVAT ?Skin: No obvious rashes, warm, dry, intact ?Neurologic: Alert, patient's gait is slow, shuffling.  Moves all 4 extremities well.  Psychiatric: Appropriate. Normal mood with flat affect ? ?Laboratory Data: ?No results found for this or any previous visit (from the past 24 hour(s)). ? ?Lab Results  ?Component Value Date  ? WBC 7.3 08/12/2021  ? HGB 7.4 (L) 08/12/2021  ? HCT 23.9 (L) 08/12/2021  ? MCV 91.6 08/12/2021  ? PLT 159 08/12/2021  ? ? ?Lab Results  ?Component Value Date  ? CREATININE 1.17 (H) 08/13/2021  ? ?Lab Results  ?Component Value Date  ? HGBA1C 7.1 (A) 06/12/2021  ? ? ?Urinalysis ?   ?Component Value Date/Time  ?  COLORURINE STRAW (A) 08/09/2021 0120  ? APPEARANCEUR CLEAR 08/09/2021 0120  ? LABSPEC 1.024 08/09/2021 0120  ? PHURINE 5.0 08/09/2021 0120  ? [place] NEGATIVE 08/09/2021 0120  ? GLUCOSEU NEGATIVE 11/16/2018 1219  ? [place] NEGATIVE 08/09/2021 0120  ? [place] NEGATIVE 08/09/2021 0120  ? [place] NEGATIVE 08/09/2021 0120  ? PROTEINUR 100 (A) 08/09/2021 0120  ? UROBILINOGEN 0.2 11/16/2018 1219  ? NITRITE NEGATIVE 08/09/2021 0120  ? LEUKOCYTESUR NEGATIVE 08/09/2021 0120  ? ? ?Lab Results  ?Component Value Date  ? BACTERIA NONE SEEN 08/09/2021  ? ? ?Pertinent Imaging: ?No results found for this or any previous visit. ? ?No results found for this or any previous visit. ? ? ? ? ?[place]lin, Berneice Heinrich, PA-C ?[facility] [place] ?  ?

## 2021-12-25 NOTE — Patient Instructions (Signed)
Practice timed voiding and keep voiding diary as discussed. ?

## 2021-12-30 ENCOUNTER — Other Ambulatory Visit: Payer: Self-pay | Admitting: Physician Assistant

## 2022-01-02 ENCOUNTER — Other Ambulatory Visit: Payer: Self-pay | Admitting: Physician Assistant

## 2022-01-02 MED ORDER — SULFAMETHOXAZOLE-TRIMETHOPRIM 800-160 MG PO TABS: 1.0000 | ORAL_TABLET | Freq: Two times a day (BID) | ORAL | 0 refills | Status: DC

## 2022-01-02 NOTE — Progress Notes (Signed)
Patient aware.

## 2022-01-06 DIAGNOSIS — Z609 Problem related to social environment, unspecified: Secondary | ICD-10-CM | POA: Diagnosis not present

## 2022-01-06 DIAGNOSIS — S0083XA Contusion of other part of head, initial encounter: Secondary | ICD-10-CM | POA: Diagnosis not present

## 2022-01-06 DIAGNOSIS — S0990XA Unspecified injury of head, initial encounter: Secondary | ICD-10-CM | POA: Diagnosis not present

## 2022-01-06 DIAGNOSIS — W010XXA Fall on same level from slipping, tripping and stumbling without subsequent striking against object, initial encounter: Secondary | ICD-10-CM | POA: Diagnosis not present

## 2022-01-06 DIAGNOSIS — S0003XA Contusion of scalp, initial encounter: Secondary | ICD-10-CM | POA: Diagnosis not present

## 2022-01-06 DIAGNOSIS — S51801A Unspecified open wound of right forearm, initial encounter: Secondary | ICD-10-CM | POA: Diagnosis not present

## 2022-01-14 ENCOUNTER — Other Ambulatory Visit: Payer: Self-pay

## 2022-01-14 DIAGNOSIS — N3000 Acute cystitis without hematuria: Secondary | ICD-10-CM

## 2022-01-15 ENCOUNTER — Ambulatory Visit: Payer: Medicare PPO | Admitting: Physician Assistant

## 2022-01-15 VITALS — BP 95/60 | HR 106 | Ht 63.0 in | Wt 132.7 lb

## 2022-01-15 DIAGNOSIS — N3941 Urge incontinence: Secondary | ICD-10-CM

## 2022-01-15 DIAGNOSIS — N3000 Acute cystitis without hematuria: Secondary | ICD-10-CM | POA: Diagnosis not present

## 2022-01-15 LAB — BLADDER SCAN AMB NON-IMAGING: Scan Result: 0

## 2022-01-15 NOTE — Progress Notes (Signed)
post void residual =0mL 

## 2022-01-15 NOTE — Progress Notes (Signed)
? ?Assessment: ?1. Urge incontinence of urine   ?2. Acute cystitis without hematuria   ? ? ?Plan: ?Patient will bring urine back for urinalysis.  Continue Myrbetriq 25 mg daily, samples given for 5 weeks.  She may increase to 50 mg daily to see if symptoms improve on a higher dose.  Follow-up in 3 months for PVR and UA.  Patient may need cystoscopy if urinalysis indicates another infection.  Also encouraged tight control of blood glucose levels in order to decrease her urgency and frequency. ? ?Chief Complaint: ?No chief complaint on file. ? ? ?HPI: ?[name] [name] is a 86 y.o. female who presents for continued evaluation of recurrent UTIs and urge incontinence.  Only states she has been doing much better since her last evaluation and treatment with antibiotics.  Mobility continues to be of concern.  No current symptoms of dysuria or burning.  No gross hematuria.  MDX culture results from her last visit on 12/25/2021 = greater than 100,000 colonies of Klebsiella pneumonia resistant to Macrobid, ampicillin, vancomycin, linezolid, Zosyn.  Intermediate resistance to Cipro patient was Rxd at her office visit.  She switched to Bactrim DS for 7 days as indicated by her culture results. She has been taking Myrbetriq since last visit has tolerated it well. ? ?UA= the patient and family report that she voided before arriving today and is unable to give a specimen.  Family will drop it off tomorrow for evaluation. ?PVR=61m ? ?12/25/21 ?[name] PUMPHREYis a 86y.o. year old female with a history of multiple medical problems including chronic diarrhea, hypertension, osteoarthritis, type 2 diabetes, on insulin who is seen in consultation from DCaryl Bis MD  for evaluation of urinary incontinence symptoms.  Patient reports that she has had a difficult time getting to the restroom when she feels the urge to void because she has postop alteration of her gait and is on a rolling walker.  She feels she is too slow to get to the  restroom before she voids.  Patient's husband states she is having incontinence during the night on a regular basis with some recent improvement to only 1 issue of wetness during the night.  She is going through 3-7 pads per day since symptoms began. Symptoms have been ongoing for several months specifically starting after she was hospitalized for treatment of perforated bowel.  Soon after discharge, the patient sustained a fall and had to be admitted for hip fracture and was discharged in January of this year. [facility] UTI on 11/04/2021 with positive culture which grew Proteus mirabilis resistant to nitrofurantoin tetracycline and Bactrim.  Patient was treated with Cipro.  Family reports 2 UTIs within the past year, possibly 3.  No history of urinalysis or urine culture noted in patient's medical records.  Today, she continues to complain of urinary frequency, urgency, incontinence but denies any burning, weakness of stream, gross hematuria.  Fever or chills.  No abdominal pain.. ?  ?History obtained from the patient, her spouse, and her daughter due to patient's cognitive issues rendering her unable to give a thorough history.  The patient's daughter reports patient is only bathing once per week and often uses the same washcloth to bathe.  His husband usually assist her with ambulation and to the restroom which is down the hall from her bedroom.  They have a bedside toilet but have not utilized it. ?PVR =0 mL ?UA = 11-30 WBCs, many bacteria, nitrite negative, no RBCs. ? ?  Portions of the above documentation were copied from a prior visit for review purposes only. ? ?Allergies: ?No Known Allergies ? ?PMH: ?Past Medical History:  ?Diagnosis Date  ? Anemia   ? Anxiety   ? Arthritis   ? Chronic back pain   ? Chronic diarrhea   ? Diabetes ([place])   ? Diverticulosis   ? GERD (gastroesophageal reflux disease)   ? Hyperlipidemia   ? Hypertension   ? Internal hemorrhoids   ? UTI  (urinary tract infection)   ? ? ?PSH: ?Past Surgical History:  ?Procedure Laterality Date  ? ABDOMINAL HYSTERECTOMY    ? CARDIAC CATHETERIZATION    ? CATARACT EXTRACTION, BILATERAL    ? CHOLECYSTECTOMY    ? COLONOSCOPY  11/2015  ? Dr. Britta Mccreedy: hyperplastic polyp from sigmoid colon. biopsies from ascending colon were negative for microscopic colitis.   ? COLONOSCOPY N/A 03/31/2018  ? Dr. Gala Romney: Diverticulosis, random colon biopsies negative, hemorrhoids.  No future screening or surveillance colonoscopy is recommended.  ? LAPAROTOMY N/A 08/08/2021  ? Procedure: EXPLORATORY LAPAROTOMY,  OVERSEW PERFORATED SMALL BOWEL;  Surgeon: Virl Cagey, MD;  Location: [facility];  Service: General;  Laterality: N/A;  ? PARTIAL HIP ARTHROPLASTY Right   ? TONSILLECTOMY    ? TRIGGER FINGER RELEASE    ? ? ?SH: ?Social History  ? ?Tobacco Use  ? Smoking status: Never  ? Smokeless tobacco: Never  ?Vaping Use  ? Vaping Use: Never used  ?Substance Use Topics  ? Alcohol use: No  ?  Alcohol/week: 0.0 standard drinks  ? Drug use: No  ? ? ?ROS: ?See HPI ? ?PE: ?BP 95/60   Pulse (!) 106   Ht '5\' 3"'$  (1.6 m)   Wt 132 lb 11.2 oz (60.2 kg)   BMI 23.51 kg/m?  ?GENERAL APPEARANCE:  Well appearing, well developed, well nourished, NAD ?HEENT:  Atraumatic, normocephalic ?NECK:  Supple. Trachea midline ?ABDOMEN:  Soft, non-tender, no masses ?EXTREMITIES:  Moves all extremities well, without clubbing, cyanosis, or edema ?NEUROLOGIC:  Alert and oriented x 3, in wheelchair ?MENTAL STATUS: Appropriate without agitation ?BACK:  Non-tender to palpation, No CVAT ?SKIN:  Warm, dry, and intact ? ? ?Results: ?Laboratory Data: ?Lab Results  ?Component Value Date  ? WBC 7.3 08/12/2021  ? HGB 7.4 (L) 08/12/2021  ? HCT 23.9 (L) 08/12/2021  ? MCV 91.6 08/12/2021  ? PLT 159 08/12/2021  ? ? ?Lab Results  ?Component Value Date  ? CREATININE 1.17 (H) 08/13/2021  ? ? ? ?Lab Results  ?Component Value Date  ? HGBA1C 7.1 (A) 06/12/2021  ? ? ?Urinalysis ?   ?Component  Value Date/Time  ? COLORURINE STRAW (A) 08/09/2021 0120  ? APPEARANCEUR Cloudy (A) 12/25/2021 0916  ? LABSPEC 1.024 08/09/2021 0120  ? PHURINE 5.0 08/09/2021 0120  ? GLUCOSEU Negative 12/25/2021 0916  ? GLUCOSEU NEGATIVE 11/16/2018 1219  ? [place] NEGATIVE 08/09/2021 0120  ? BILIRUBINUR Negative 12/25/2021 0916  ? [place] NEGATIVE 08/09/2021 0120  ? PROTEINUR 2+ (A) 12/25/2021 0916  ? PROTEINUR 100 (A) 08/09/2021 0120  ? UROBILINOGEN 0.2 11/16/2018 1219  ? NITRITE Negative 12/25/2021 0916  ? NITRITE NEGATIVE 08/09/2021 0120  ? LEUKOCYTESUR 1+ (A) 12/25/2021 0916  ? LEUKOCYTESUR NEGATIVE 08/09/2021 0120  ? ? ?Lab Results  ?Component Value Date  ? LABMICR See below: 12/25/2021  ? WBCUA 11-30 (A) 12/25/2021  ? LABEPIT 0-10 12/25/2021  ? BACTERIA Many (A) 12/25/2021  ? ? ?Pertinent Imaging: ? ? ?No results found for this or any previous visit (  from the past 24 hour(s)).  ?

## 2022-01-16 DIAGNOSIS — N3000 Acute cystitis without hematuria: Secondary | ICD-10-CM | POA: Diagnosis not present

## 2022-01-16 LAB — URINALYSIS, ROUTINE W REFLEX MICROSCOPIC
Bilirubin, UA: NEGATIVE
Glucose, UA: NEGATIVE
Ketones, UA: NEGATIVE
Leukocytes,UA: NEGATIVE
Nitrite, UA: NEGATIVE
RBC, UA: NEGATIVE
Specific Gravity, UA: 1.025 (ref 1.005–1.030)
Urobilinogen, Ur: 0.2 mg/dL (ref 0.2–1.0)
pH, UA: 6.5 (ref 5.0–7.5)

## 2022-01-16 LAB — MICROSCOPIC EXAMINATION
RBC, Urine: NONE SEEN /hpf (ref 0–2)
Renal Epithel, UA: NONE SEEN /hpf

## 2022-01-28 ENCOUNTER — Ambulatory Visit: Payer: Medicare PPO | Admitting: Endocrinology

## 2022-01-29 ENCOUNTER — Encounter: Payer: Self-pay | Admitting: Endocrinology

## 2022-01-29 ENCOUNTER — Ambulatory Visit: Payer: Medicare PPO | Admitting: Endocrinology

## 2022-01-29 VITALS — BP 142/72 | HR 60 | Ht 63.0 in | Wt 133.0 lb

## 2022-01-29 DIAGNOSIS — E1165 Type 2 diabetes mellitus with hyperglycemia: Secondary | ICD-10-CM | POA: Diagnosis not present

## 2022-01-29 DIAGNOSIS — Z794 Long term (current) use of insulin: Secondary | ICD-10-CM | POA: Diagnosis not present

## 2022-01-29 LAB — POCT GLYCOSYLATED HEMOGLOBIN (HGB A1C): Hemoglobin A1C: 7.4 % — AB (ref 4.0–5.6)

## 2022-01-29 NOTE — Patient Instructions (Signed)
Check blood sugars on waking up 4-5 days a week ? ?Also check blood sugars about 2 hours after meals and do this after different meals by rotation ? ?Recommended blood sugar levels on waking up are 90-130 and about 2 hours after meal is 130-180 ? ?Please bring your blood sugar monitor to each visit, thank you ? ? ?

## 2022-01-29 NOTE — Progress Notes (Signed)
? ?Patient ID: [name] [name], female   DOB: 12/03/1935, 86 y.o.   MRN: [id number] ? ?       ? ? ?Reason for Appointment: Follow-up for Type 2 Diabetes ? ? ?History of Present Illness:  ?        ?Date of diagnosis of type 2 diabetes mellitus: At age 59   approximately     ? ?Background history:  ?She is a poor historian and not clear when she was started on insulin, probably at least since 2018 ?She thinks she has been on metformin in the past but this was causing diarrhea ?She does not know what other medications she has taken ?No prior A1c records are available except from 11/18 ? ?Recent history:  ? ?A1c is slightly higher at 7.4 ? ?INSULIN regimen is: Soliqua 18 units in am     ? ?Non-insulin hypoglycemic drugs the patient is taking are: None ? ?Current management, blood sugar patterns and problems identified:  ? ?She has not been seen since 9/22  ?She has had issues with hip fracture and other medical problems  ?However she does not think she has changed her diet, weight is down 3 pounds since last visit  ?As usual she forgets to check her sugars after meals and only checks in the morning  ?Although her average blood sugar is only of 158 fasting at home she has a couple of readings over 400 which she cannot explain  ?Unable to verify whether her test strips are expired and she thinks her Accu-Chek meter is relatively old ?Only yesterday she apparently forgot her morning Soliqua with fasting reading 192 today  ?Diet is about the same ?      ?Side effects from medications have been: Diarrhea from metformin ? :   ? ?Typical meal intake: Breakfast is   toast, eggs and meat, lunch usually half sandwich with fruit and sweet tea, dinner meat, potato, salad and tea.  Mostly snacks are fruits             ? ? ?Glucose monitoring:  Done 1 times a day         Glucometer:  Accu-Chek  ?   ?Blood Glucose readings by monitor review ? ?FASTING blood sugars range 106-415 with average for 30 days = 158 ? ?Previously: ? ?PRE-MEAL  Fasting Lunch Dinner Bedtime Overall  ?Glucose range: 116-183    93-282  ?Mean/median: 152    150  ? ?POST-MEAL PC Breakfast PC Lunch PC Dinner  ?Glucose range:  94-141 282  ?Mean/median:     ? ? ? ?Dietician visit, most recent: Few years ago ? ?Weight history: ? ?Wt Readings from Last 3 Encounters:  ?01/29/22 133 lb (60.3 kg)  ?01/15/22 132 lb 11.2 oz (60.2 kg)  ?12/25/21 132 lb 6.4 oz (60.1 kg)  ? ? ?Glycemic control: A1c in 11/19 was 8.3 ? ?  ?Lab Results  ?Component Value Date  ? HGBA1C 7.1 (A) 06/12/2021  ? HGBA1C 6.4 (A) 03/07/2021  ? HGBA1C 7.4 (H) 12/07/2020  ? ?Lab Results  ?Component Value Date  ? MICROALBUR 28.2 (H) 09/06/2020  ? LDLCALC 39 12/07/2020  ? CREATININE 1.17 (H) 08/13/2021  ? ?Lab Results  ?Component Value Date  ? MICRALBCREAT 55.6 (H) 09/06/2020  ? ? ?No results found for: FRUCTOSAMINE ? ?No visits with results within 1 Week(s) from this visit.  ?Latest known visit with results is:  ?Office Visit on 01/15/2022  ?Component Date Value Ref Range Status  ? Scan  Result 01/15/2022 0   Final  ? Specific Gravity, UA 01/16/2022 1.025  1.005 - 1.030 Final  ? pH, UA 01/16/2022 6.5  5.0 - 7.5 Final  ? Color, UA 01/16/2022 Yellow  Yellow Final  ? Appearance Ur 01/16/2022 Clear  Clear Final  ? Leukocytes,UA 01/16/2022 Negative  Negative Final  ? Protein,UA 01/16/2022 2+ (A)  Negative/Trace Final  ? Glucose, UA 01/16/2022 Negative  Negative Final  ? Ketones, UA 01/16/2022 Negative  Negative Final  ? RBC, UA 01/16/2022 Negative  Negative Final  ? Bilirubin, UA 01/16/2022 Negative  Negative Final  ? Urobilinogen, Ur 01/16/2022 0.2  0.2 - 1.0 mg/dL Final  ? Nitrite, UA 01/16/2022 Negative  Negative Final  ? Microscopic Examination 01/16/2022 See below:   Final  ? WBC, UA 01/16/2022 0-5  0 - 5 /hpf Final  ? RBC 01/16/2022 None seen  0 - 2 /hpf Final  ? Epithelial Cells (non renal) 01/16/2022 0-10  0 - 10 /hpf Final  ? Renal Epithel, UA 01/16/2022 None seen  None seen /hpf Final  ? Mucus, UA 01/16/2022 Present   Not Estab. Final  ? Bacteria, UA 01/16/2022 Few (A)  None seen/Few Final  ? ? ?Allergies as of 01/29/2022   ?No Known Allergies ?  ? ?  ?Medication List  ?  ? ?  ? Accurate as of Jan 29, 2022 12:08 PM. If you have any questions, ask your nurse or doctor.  ?  ?  ? ?  ? ?Accu-Chek Guide test strip ?Generic drug: glucose blood ?USE AS DIRECTED THREE TIMES DAILY ?  ?atorvastatin 40 MG tablet ?Commonly known as: LIPITOR ?Take 20 mg by mouth daily. ?  ?B-12 1000 MCG Tabs ?Take 1,000 mcg by mouth daily. ?  ?Calcium Citrate-Vitamin D 200-250 MG-UNIT Tabs ?Take 1 tablet by mouth daily. ?  ?Centrum tablet ?Take 1 tablet by mouth daily. ?  ?colestipol 1 g tablet ?Commonly known as: COLESTID ?Take 1 tablet (1 g total) by mouth 2 (two) times daily. ?  ?Comfort EZ Pen Needles 31G X 5 MM Misc ?Generic drug: Insulin Pen Needle ?  ?diclofenac sodium 1 % Gel ?Commonly known as: VOLTAREN ?Apply 2 g topically at bedtime. ?  ?donepezil 10 MG tablet ?Commonly known as: ARICEPT ?Take 1 tablet (10 mg total) by mouth at bedtime. Please call and make overdue appt for further refills. 1st attempt ?  ?DULoxetine 60 MG capsule ?Commonly known as: CYMBALTA ?Take 60 mg by mouth daily. ?  ?enalapril 20 MG tablet ?Commonly known as: VASOTEC ?Take 10 mg by mouth 2 (two) times daily. ?  ?ferrous sulfate 325 (65 FE) MG EC tablet ?Take 325 mg by mouth daily with breakfast. ?  ?hydrochlorothiazide 12.5 MG tablet ?Commonly known as: HYDRODIURIL ?Take 12.5 mg by mouth daily. ?  ?labetalol 200 MG tablet ?Commonly known as: NORMODYNE ?Take 300 mg by mouth 2 (two) times daily. ?  ?levothyroxine 25 MCG tablet ?Commonly known as: SYNTHROID ?Take 25 mcg by mouth daily before breakfast. ?  ?magnesium oxide 400 MG tablet ?Commonly known as: MAG-OX ?Take 400 mg by mouth daily. ?  ?mirabegron ER 25 MG Tb24 tablet ?Commonly known as: MYRBETRIQ ?Take 1 tablet (25 mg total) by mouth daily. ?  ?ondansetron 4 MG disintegrating tablet ?Commonly known as: ZOFRAN-ODT ?Take 1  tablet (4 mg total) by mouth every 6 (six) hours as needed for nausea. ?  ?RA PROBIOTIC DIGESTIVE CARE PO ?Take 1 capsule by mouth daily. ?  ?sertraline 25 MG tablet ?  Commonly known as: ZOLOFT ?Take 25 mg by mouth daily. ?  ?Soliqua 100-33 UNT-MCG/ML Sopn ?Generic drug: Insulin Glargine-Lixisenatide ?INJECT 22 UNITS SQ DAILY BEFORE BREAKFast ?What changed: See the new instructions. ?  ?sulfamethoxazole-trimethoprim 800-160 MG tablet ?Commonly known as: BACTRIM DS ?Take 1 tablet by mouth 2 (two) times daily. ?  ? ?  ? ? ?Allergies: No Known Allergies ? ?Past Medical History:  ?Diagnosis Date  ? Anemia   ? Anxiety   ? Arthritis   ? Chronic back pain   ? Chronic diarrhea   ? Diabetes ([place])   ? Diverticulosis   ? GERD (gastroesophageal reflux disease)   ? Hyperlipidemia   ? Hypertension   ? Internal hemorrhoids   ? UTI (urinary tract infection)   ? ? ?Past Surgical History:  ?Procedure Laterality Date  ? ABDOMINAL HYSTERECTOMY    ? CARDIAC CATHETERIZATION    ? CATARACT EXTRACTION, BILATERAL    ? CHOLECYSTECTOMY    ? COLONOSCOPY  11/2015  ? Dr. Britta Mccreedy: hyperplastic polyp from sigmoid colon. biopsies from ascending colon were negative for microscopic colitis.   ? COLONOSCOPY N/A 03/31/2018  ? Dr. Gala Romney: Diverticulosis, random colon biopsies negative, hemorrhoids.  No future screening or surveillance colonoscopy is recommended.  ? LAPAROTOMY N/A 08/08/2021  ? Procedure: EXPLORATORY LAPAROTOMY,  OVERSEW PERFORATED SMALL BOWEL;  Surgeon: Virl Cagey, MD;  Location: [facility];  Service: General;  Laterality: N/A;  ? PARTIAL HIP ARTHROPLASTY Right   ? TONSILLECTOMY    ? TRIGGER FINGER RELEASE    ? ? ?Family History  ?Problem Relation Age of Onset  ? Heart attack Sister   ? Transient ischemic attack Sister   ? Heart attack Mother   ? Diabetes Mother   ? Transient ischemic attack Mother   ? Heart attack Father   ? Diabetes Son   ? Diabetes Maternal Aunt   ? Rectal cancer Other   ?     anal CA  ? Colon cancer Neg Hx   ?  Celiac disease Neg Hx   ? Inflammatory bowel disease Neg Hx   ? Stomach cancer Neg Hx   ? ? ?Social History:  reports that she has never smoked. She has never used smokeless tobacco. She reports that she does n

## 2022-01-30 ENCOUNTER — Other Ambulatory Visit: Payer: Self-pay

## 2022-01-30 DIAGNOSIS — E1165 Type 2 diabetes mellitus with hyperglycemia: Secondary | ICD-10-CM

## 2022-01-30 MED ORDER — ACCU-CHEK GUIDE W/DEVICE KIT: PACK | 0 refills | Status: DC

## 2022-02-04 ENCOUNTER — Other Ambulatory Visit (HOSPITAL_COMMUNITY): Payer: Medicare PPO

## 2022-02-13 ENCOUNTER — Telehealth: Payer: Self-pay | Admitting: Physician Assistant

## 2022-02-13 ENCOUNTER — Ambulatory Visit: Payer: Medicare PPO | Admitting: Neurology

## 2022-02-13 ENCOUNTER — Telehealth: Payer: Self-pay | Admitting: Neurology

## 2022-02-13 DIAGNOSIS — R634 Abnormal weight loss: Secondary | ICD-10-CM

## 2022-02-13 DIAGNOSIS — A09 Infectious gastroenteritis and colitis, unspecified: Secondary | ICD-10-CM

## 2022-02-13 DIAGNOSIS — K529 Noninfective gastroenteritis and colitis, unspecified: Secondary | ICD-10-CM

## 2022-02-13 MED ORDER — RIFAXIMIN 550 MG PO TABS
550.0000 mg | ORAL_TABLET | Freq: Three times a day (TID) | ORAL | 0 refills | Status: AC
Start: 2022-02-13 — End: 2022-02-27

## 2022-02-13 NOTE — Telephone Encounter (Signed)
Returned call to [place]. She is aware that pt can hold Colestid while taking Xifaxan. [name] verbalized understanding and had no concerns at the end of the call.

## 2022-02-13 NOTE — Telephone Encounter (Signed)
We received a call from patient daughter. Per daughter, pt has a flare up, has had diarrhea for a week. Daughter states last time patient had chronic diarrhea she was given an antibiotic. Daughter wants to know if she needs to be on antibiotics again? Please advise.

## 2022-02-13 NOTE — Telephone Encounter (Signed)
Pt's daughter, Ulla Mckiernan cancelled pt appt due to pt is sick.

## 2022-02-13 NOTE — Telephone Encounter (Signed)
Patient's daughter, [name], called back.  She said the last time her mother was prescribed Xifaxin she was told not to take it with her colestipol.  Today, she was not given those instructions, so she is just calling to find out what to do.  Please call patient and advise.  Thank you.

## 2022-02-13 NOTE — Telephone Encounter (Signed)
Called and spoke with [name] regarding Jennifer's recommendations. [name] would like RX sent to [facility]. She knows that if patient's diarrhea returns sepite treatment then they will need to let us know because she will need an additional work-up. [name] verbalized understanding and had no concerns at the end of the call.  RX for Rifaximin sent to requested pharmacy.

## 2022-02-13 NOTE — Telephone Encounter (Signed)
Returned call to [place]. She reports that for the last week patient has been having consistent episodes of diarrhea. She reports that patient will have 2-3 episodes a day of watery diarrhea and she is also having accidents and not knowing that she has had a BM. [name] states that pt is taking Colestid BID and Imodium PRN. She wants to know if she should take the Colestid along with Imodium and how many times a day can she take the Imodium? She reports that pt's symptoms improved after the Rifaximin and wondering if she needs another dose. [name] denies that pt has any abdominal pain, fever, or other symptoms. Please advise, thanks.

## 2022-02-28 ENCOUNTER — Other Ambulatory Visit: Payer: Medicare PPO

## 2022-02-28 NOTE — Telephone Encounter (Signed)
Yes lets get Gi path panel, O&P, fecal lactoferrin and pancreatic elastase.  Thanks-JLL   Lm on vm for [name] to return call. Stool study orders in epic.

## 2022-02-28 NOTE — Telephone Encounter (Signed)
Lori returned call. We have reviewed Jennifer's recommendations. [name] has been advised that the orders have been placed and they can go by the lab in the basement of our office at their convenience to pick up containers and collection instructions. [name] states that they will be by next week to pick up the kits. [name] is aware that the stool tests are sent to an outside lab and will take several days before we receive the results. I told her to have patient hold Imodium for now and she can resume it PRN after she collects her stool samples. [name] verbalized understanding and had no concerns at the end of the call.

## 2022-02-28 NOTE — Telephone Encounter (Signed)
Returned call to [place]. [name] reports that patient is supposed to finish Xifaxan tomorrow but she has still been having episodes of incontinence. [name] reports that patient's diarrhea has slowed down, but when she has an "episode" she will have large volume diarrhea. [name] states that pt has not reported any abdominal pain or fever. Pt is eating and drinking OK. Has lost about 10 lbs over the last year. [name] reports that she recommended that her father give the patient Imodium daily, but he tends to forget to give it to her. Pt is taking Florastor daily. Pt has been holding Colestid while taking Xifaxan. [name] reports that patient saw great improvement the 1st time she took Xifaxan but this time it does not seem to be clearing up her symptoms as much. I mentioned that she may need stool studies, but I will check with you and call her back with your recommendation. Dr. Tarri Glenn next available appt is not until 7/11 and your next avaialbnle is 03/27/22. Please advise, thanks.

## 2022-02-28 NOTE — Telephone Encounter (Signed)
Patients daughter called stating that patient has been taking Xifaxan for the last 2 weeks and it is not helping patient. Daughter wanted to schedule appointment with Dr. Tarri Glenn and I advised she did not have an opening until 7/11. Patient stated she would like to discuss with nurse. Please advise.

## 2022-02-28 NOTE — Addendum Note (Signed)
Addended by: Yevette Edwards on: 02/28/2022 10:22 AM   Modules accepted: Orders

## 2022-03-07 DIAGNOSIS — M25551 Pain in right hip: Secondary | ICD-10-CM | POA: Diagnosis not present

## 2022-03-07 DIAGNOSIS — Z09 Encounter for follow-up examination after completed treatment for conditions other than malignant neoplasm: Secondary | ICD-10-CM | POA: Diagnosis not present

## 2022-03-07 DIAGNOSIS — M25562 Pain in left knee: Secondary | ICD-10-CM | POA: Diagnosis not present

## 2022-03-07 DIAGNOSIS — M25552 Pain in left hip: Secondary | ICD-10-CM | POA: Diagnosis not present

## 2022-03-07 DIAGNOSIS — M7062 Trochanteric bursitis, left hip: Secondary | ICD-10-CM | POA: Diagnosis not present

## 2022-03-12 DIAGNOSIS — E039 Hypothyroidism, unspecified: Secondary | ICD-10-CM | POA: Diagnosis not present

## 2022-04-14 ENCOUNTER — Ambulatory Visit (INDEPENDENT_AMBULATORY_CARE_PROVIDER_SITE_OTHER): Payer: Medicare PPO | Admitting: Urology

## 2022-04-14 ENCOUNTER — Encounter: Payer: Self-pay | Admitting: Urology

## 2022-04-14 VITALS — BP 161/72 | HR 71

## 2022-04-14 DIAGNOSIS — N3941 Urge incontinence: Secondary | ICD-10-CM | POA: Diagnosis not present

## 2022-04-14 DIAGNOSIS — N390 Urinary tract infection, site not specified: Secondary | ICD-10-CM

## 2022-04-14 MED ORDER — NITROFURANTOIN MONOHYD MACRO 100 MG PO CAPS: 100.0000 mg | ORAL_CAPSULE | Freq: Two times a day (BID) | ORAL | 0 refills | Status: DC

## 2022-04-14 MED ORDER — CIPROFLOXACIN HCL 500 MG PO TABS: 500.0000 mg | ORAL_TABLET | Freq: Once | ORAL | Status: DC

## 2022-04-14 MED ORDER — GEMTESA 75 MG PO TABS: 1.0000 | ORAL_TABLET | Freq: Every day | ORAL | 0 refills | Status: DC

## 2022-04-14 NOTE — Progress Notes (Signed)
04/14/2022 11:37 AM   [name] [name] 1935-11-17 [id number]  Referring provider: Caryl Bis, MD [place],  [place] [address]  Recurrent UTI and urge incontinence   HPI: [name] is a 86yo here for followup for recurrent UTI and OAB. She is currently on mirabegron 60m daily. For the past 2 days she has noted increased urinary frequency, dysuria and urge incontinence. No straining to urinate. She uses 6-7 pads per day.  UA today is concerning for infection.     PMH: Past Medical History:  Diagnosis Date   Anemia    Anxiety    Arthritis    Chronic back pain    Chronic diarrhea    Diabetes ([place]    Diverticulosis    GERD (gastroesophageal reflux disease)    Hyperlipidemia    Hypertension    Internal hemorrhoids    UTI (urinary tract infection)     Surgical History: Past Surgical History:  Procedure Laterality Date   ABDOMINAL HYSTERECTOMY     CARDIAC CATHETERIZATION     CATARACT EXTRACTION, BILATERAL     CHOLECYSTECTOMY     COLONOSCOPY  11/2015   Dr. BBritta [name] hyperplastic polyp from sigmoid colon. biopsies from ascending colon were negative for microscopic colitis.    COLONOSCOPY N/A 03/31/2018   Dr. RGala [name] Diverticulosis, random colon biopsies negative, hemorrhoids.  No future screening or surveillance colonoscopy is recommended.   LAPAROTOMY N/A 08/08/2021   Procedure: EXPLORATORY LAPAROTOMY,  OVERSEW PERFORATED SMALL BOWEL;  Surgeon: [name] Cagey MD;  Location: [facility];  Service: General;  Laterality: N/A;   PARTIAL HIP ARTHROPLASTY Right    TONSILLECTOMY     TRIGGER FINGER RELEASE      Home Medications:  Allergies as of 04/14/2022   No Known Allergies      Medication List        Accurate as of April 14, 2022 11:37 AM. If you have any questions, ask your nurse or doctor.          Accu-Chek Guide test strip Generic drug: glucose blood USE AS DIRECTED THREE TIMES DAILY   Accu-Chek Guide w/Device Kit Use to check blood sugar  times a day    atorvastatin 40 MG tablet Commonly known as: LIPITOR Take 20 mg by mouth daily.   B-12 1000 MCG Tabs Take 1,000 mcg by mouth daily.   Calcium Citrate-Vitamin D 200-250 MG-UNIT Tabs Take 1 tablet by mouth daily.   Centrum tablet Take 1 tablet by mouth daily.   colestipol 1 g tablet Commonly known as: COLESTID Take 1 tablet (1 g total) by mouth 2 (two) times daily.   Comfort EZ Pen Needles 31G X 5 MM Misc Generic drug: Insulin Pen Needle   diclofenac sodium 1 % Gel Commonly known as: VOLTAREN Apply 2 g topically at bedtime.   donepezil 10 MG tablet Commonly known as: ARICEPT Take 1 tablet (10 mg total) by mouth at bedtime. Please call and make overdue appt for further refills. 1st attempt   DULoxetine 60 MG capsule Commonly known as: CYMBALTA Take 60 mg by mouth daily.   enalapril 20 MG tablet Commonly known as: VASOTEC Take 10 mg by mouth 2 (two) times daily.   ferrous sulfate 325 (65 FE) MG EC tablet Take 325 mg by mouth daily with breakfast.   hydrochlorothiazide 12.5 MG tablet Commonly known as: HYDRODIURIL Take 12.5 mg by mouth daily.   labetalol 200 MG tablet Commonly known as: NORMODYNE Take 300 mg by mouth 2 (  two) times daily.   levothyroxine 25 MCG tablet Commonly known as: SYNTHROID Take 25 mcg by mouth daily before breakfast.   magnesium oxide 400 MG tablet Commonly known as: MAG-OX Take 400 mg by mouth daily.   mirabegron ER 25 MG Tb24 tablet Commonly known as: MYRBETRIQ Take 1 tablet (25 mg total) by mouth daily.   ondansetron 4 MG disintegrating tablet Commonly known as: ZOFRAN-ODT Take 1 tablet (4 mg total) by mouth every 6 (six) hours as needed for nausea.   RA PROBIOTIC DIGESTIVE CARE PO Take 1 capsule by mouth daily.   sertraline 25 MG tablet Commonly known as: ZOLOFT Take 25 mg by mouth daily.   Soliqua 100-33 UNT-MCG/ML Sopn Generic drug: Insulin Glargine-Lixisenatide INJECT 22 UNITS SQ DAILY BEFORE BREAKFast What changed:  See the new instructions.        Allergies: No Known Allergies  Family History: Family History  Problem Relation Age of Onset   Heart attack Sister    Transient ischemic attack Sister    Heart attack Mother    Diabetes Mother    Transient ischemic attack Mother    Heart attack Father    Diabetes Son    Diabetes Maternal Aunt    Rectal cancer Other        anal CA   Colon cancer Neg Hx    Celiac disease Neg Hx    Inflammatory bowel disease Neg Hx    Stomach cancer Neg Hx     Social History:  reports that she has never smoked. She has never used smokeless tobacco. She reports that she does not drink alcohol and does not use drugs.  ROS: All other review of systems were reviewed and are negative except what is noted above in HPI  Physical Exam: BP (!) 161/72   Pulse 71   Constitutional:  Alert and oriented, No acute distress. HEENT: [place] AT, moist mucus membranes.  Trachea midline, no masses. Cardiovascular: No clubbing, cyanosis, or edema. Respiratory: Normal respiratory effort, no increased work of breathing. GI: Abdomen is soft, nontender, nondistended, no abdominal masses GU: No CVA tenderness.  Lymph: No cervical or inguinal lymphadenopathy. Skin: No rashes, bruises or suspicious lesions. Neurologic: Grossly intact, no focal deficits, moving all 4 extremities. Psychiatric: Normal mood and affect.  Laboratory Data: Lab Results  Component Value Date   WBC 7.3 08/12/2021   HGB 7.4 (L) 08/12/2021   HCT 23.9 (L) 08/12/2021   MCV 91.6 08/12/2021   PLT 159 08/12/2021    Lab Results  Component Value Date   CREATININE 1.17 (H) 08/13/2021    No results found for: "PSA"  No results found for: "TESTOSTERONE"  Lab Results  Component Value Date   HGBA1C 7.4 (A) 01/29/2022    Urinalysis    Component Value Date/Time   COLORURINE STRAW (A) 08/09/2021 0120   APPEARANCEUR Clear 01/16/2022 0841   LABSPEC 1.024 08/09/2021 0120   PHURINE 5.0 08/09/2021 0120    GLUCOSEU Negative 01/16/2022 0841   GLUCOSEU NEGATIVE 11/16/2018 1219   HGBUR NEGATIVE 08/09/2021 0120   BILIRUBINUR Negative 01/16/2022 0841   KETONESUR NEGATIVE 08/09/2021 0120   PROTEINUR 2+ (A) 01/16/2022 0841   PROTEINUR 100 (A) 08/09/2021 0120   UROBILINOGEN 0.2 11/16/2018 1219   NITRITE Negative 01/16/2022 0841   NITRITE NEGATIVE 08/09/2021 0120   LEUKOCYTESUR Negative 01/16/2022 0841   LEUKOCYTESUR NEGATIVE 08/09/2021 0120    Lab Results  Component Value Date   LABMICR See below: 01/16/2022   WBCUA 0-5 01/16/2022  LABEPIT 0-10 01/16/2022   MUCUS Present 01/16/2022   BACTERIA Few (A) 01/16/2022    Pertinent Imaging:  No results found for this or any previous visit.  No results found for this or any previous visit.  No results found for this or any previous visit.  No results found for this or any previous visit.  No results found for this or any previous visit.  No results found for this or any previous visit.  No results found for this or any previous visit.  No results found for this or any previous visit.   Assessment & Plan:    1. Recurrent UTI -Urine culture  - Urinalysis, Routine w reflex microscopic - ciprofloxacin (CIPRO) tablet 500 mg  2. Urge incontinence of urine -   No follow-ups on file.  Nicolette Bang, MD  [facility] [place]

## 2022-04-15 NOTE — Telephone Encounter (Signed)
error 

## 2022-04-16 ENCOUNTER — Other Ambulatory Visit: Payer: Self-pay | Admitting: Urology

## 2022-04-16 ENCOUNTER — Telehealth: Payer: Self-pay

## 2022-04-16 LAB — URINE CULTURE

## 2022-04-16 MED ORDER — NITROFURANTOIN MACROCRYSTAL 50 MG PO CAPS: 50.0000 mg | ORAL_CAPSULE | Freq: Every day | ORAL | 11 refills | Status: DC

## 2022-04-16 NOTE — Telephone Encounter (Signed)
-----   Message from [facility], [place] sent at 04/16/2022 10:51 AM EDT ----- Please let pt know we cultured her urine after her cysto and the results are negative for bacterial growth. Dr. Alyson Ingles wants her to start Macrodantin HS for UTI prevention. I will send in Rx now ----- Message ----- From: Iris Pert, LPN Sent: 12/29/270  10:05 AM EDT To: Cleon Gustin, MD  Please review F/u 05/12/22

## 2022-04-16 NOTE — Progress Notes (Signed)
HS Macrodantin sent to pt pharmacy for UTI prevention per Dr. Alyson Ingles

## 2022-04-16 NOTE — Telephone Encounter (Signed)
Husband aware and voiced understanding.  Per Sharee Pimple d/c macrobid as long as patient is not having any symptoms.  Continue Gemtesa rx and begin macrodantin nightly.

## 2022-04-16 NOTE — Telephone Encounter (Signed)
-----   Message from [facility], [place] sent at 04/16/2022 10:51 AM EDT ----- Please let pt know we cultured her urine after her cysto and the results are negative for bacterial growth. Dr. Alyson Ingles wants her to start Macrodantin HS for UTI prevention. I will send in Rx now ----- Message ----- From: Iris Pert, LPN Sent: 06/13/412  10:05 AM EDT To: Cleon Gustin, MD  Please review F/u 05/12/22

## 2022-05-02 ENCOUNTER — Ambulatory Visit: Payer: Medicare PPO | Admitting: Endocrinology

## 2022-05-12 ENCOUNTER — Ambulatory Visit (INDEPENDENT_AMBULATORY_CARE_PROVIDER_SITE_OTHER): Payer: Medicare PPO | Admitting: Urology

## 2022-05-12 ENCOUNTER — Encounter: Payer: Self-pay | Admitting: Urology

## 2022-05-12 VITALS — BP 128/69 | HR 84 | Ht 63.0 in | Wt 133.0 lb

## 2022-05-12 DIAGNOSIS — Z87440 Personal history of urinary (tract) infections: Secondary | ICD-10-CM | POA: Diagnosis not present

## 2022-05-12 DIAGNOSIS — N3941 Urge incontinence: Secondary | ICD-10-CM | POA: Diagnosis not present

## 2022-05-12 DIAGNOSIS — N390 Urinary tract infection, site not specified: Secondary | ICD-10-CM

## 2022-05-12 MED ORDER — GEMTESA 75 MG PO TABS: 1.0000 | ORAL_TABLET | Freq: Every day | ORAL | 11 refills | Status: DC

## 2022-05-12 NOTE — Progress Notes (Signed)
05/12/2022 3:39 PM   Gerilyn Nestle [birth year] [id number]  Referring provider: Caryl Bis, MD [place],  [place] [address]  Followup OAb and recurrent UTI   HPI: Ms [name] [name] is a 86yo here for folowup for recurrent UTI and OAB. No UTIs since last visit. She is doing better on Gemtesa 65m daily. Her urgency and frequency has improved to every 2 hours.    PMH: Past Medical History:  Diagnosis Date   Anemia    Anxiety    Arthritis    Chronic back pain    Chronic diarrhea    [place]    Diverticulosis    GERD (gastroesophageal reflux disease)    Hyperlipidemia    Hypertension    Internal hemorrhoids    UTI (urinary tract infection)     Surgical History: Past Surgical History:  Procedure Laterality Date   ABDOMINAL HYSTERECTOMY     CARDIAC CATHETERIZATION     CATARACT EXTRACTION, BILATERAL     CHOLECYSTECTOMY     COLONOSCOPY  11/2015   Dr. BBritta Mccreedy hyperplastic polyp from sigmoid colon. biopsies from ascending colon were negative for microscopic colitis.    COLONOSCOPY N/A 03/31/2018   Dr. RGala Romney Diverticulosis, random colon biopsies negative, hemorrhoids.  No future screening or surveillance colonoscopy is recommended.   LAPAROTOMY N/A 08/08/2021   Procedure: EXPLORATORY LAPAROTOMY,  OVERSEW PERFORATED SMALL BOWEL;  Surgeon: BVirl Cagey MD;  Location: [facility];  Service: General;  Laterality: N/A;   PARTIAL HIP ARTHROPLASTY Right    TONSILLECTOMY     TRIGGER FINGER RELEASE      Home Medications:  Allergies as of 05/12/2022   No Known Allergies      Medication List        Accurate as of May 12, 2022  3:39 PM. If you have any questions, ask your nurse or doctor.          Accu-Chek Guide test strip Generic drug: glucose blood USE AS DIRECTED THREE TIMES DAILY   Accu-Chek Guide w/Device Kit Use to check blood sugar  times a day   atorvastatin 40 MG tablet Commonly known as: LIPITOR Take 20 mg by mouth daily.   B-12 1000 MCG  Tabs Take 1,000 mcg by mouth daily.   Calcium Citrate-Vitamin D 200-250 MG-UNIT Tabs Take 1 tablet by mouth daily.   Centrum tablet Take 1 tablet by mouth daily.   colestipol 1 g tablet Commonly known as: COLESTID Take 1 tablet (1 g total) by mouth 2 (two) times daily.   Comfort EZ Pen Needles 31G X 5 MM Misc Generic drug: Insulin Pen Needle   diclofenac sodium 1 % Gel Commonly known as: VOLTAREN Apply 2 g topically at bedtime.   donepezil 10 MG tablet Commonly known as: ARICEPT Take 1 tablet (10 mg total) by mouth at bedtime. Please call and make overdue appt for further refills. 1st attempt   DULoxetine 60 MG capsule Commonly known as: CYMBALTA Take 60 mg by mouth daily.   enalapril 20 MG tablet Commonly known as: VASOTEC Take 10 mg by mouth 2 (two) times daily.   ferrous sulfate 325 (65 FE) MG EC tablet Take 325 mg by mouth daily with breakfast.   Gemtesa 75 MG Tabs Generic drug: Vibegron Take 1 capsule by mouth daily.   hydrochlorothiazide 12.5 MG tablet Commonly known as: HYDRODIURIL Take 12.5 mg by mouth daily.   labetalol 200 MG tablet Commonly known as: NORMODYNE Take 300 mg by mouth 2 (two)  times daily.   levothyroxine 25 MCG tablet Commonly known as: SYNTHROID Take 25 mcg by mouth daily before breakfast.   magnesium oxide 400 MG tablet Commonly known as: MAG-OX Take 400 mg by mouth daily.   mirabegron ER 25 MG Tb24 tablet Commonly known as: MYRBETRIQ Take 1 tablet (25 mg total) by mouth daily.   nitrofurantoin 50 MG capsule Commonly known as: MACRODANTIN Take 1 capsule (50 mg total) by mouth at bedtime.   ondansetron 4 MG disintegrating tablet Commonly known as: ZOFRAN-ODT Take 1 tablet (4 mg total) by mouth every 6 (six) hours as needed for nausea.   RA PROBIOTIC DIGESTIVE CARE PO Take 1 capsule by mouth daily.   sertraline 25 MG tablet Commonly known as: ZOLOFT Take 25 mg by mouth daily.   Soliqua 100-33 UNT-MCG/ML Sopn Generic  drug: Insulin Glargine-Lixisenatide INJECT 22 UNITS SQ DAILY BEFORE BREAKFast What changed: See the new instructions.        Allergies: No Known Allergies  Family History: Family History  Problem Relation Age of Onset   Heart attack Sister    Transient ischemic attack Sister    Heart attack Mother    Diabetes Mother    Transient ischemic attack Mother    Heart attack Father    Diabetes Son    Diabetes Maternal Aunt    Rectal cancer Other        anal CA   Colon cancer Neg Hx    Celiac disease Neg Hx    Inflammatory bowel disease Neg Hx    Stomach cancer Neg Hx     Social History:  reports that she has never smoked. She has never used smokeless tobacco. She reports that she does not drink alcohol and does not use drugs.  ROS: All other review of systems were reviewed and are negative except what is noted above in HPI  Physical Exam: BP 128/69   Pulse 84   Ht '5\' 3"'  (1.6 m)   Wt 133 lb (60.3 kg)   BMI 23.56 kg/m   Constitutional:  Alert and oriented, No acute distress. HEENT: [place] AT, moist mucus membranes.  Trachea midline, no masses. Cardiovascular: No clubbing, cyanosis, or edema. Respiratory: Normal respiratory effort, no increased work of breathing. GI: Abdomen is soft, nontender, nondistended, no abdominal masses GU: No CVA tenderness.  Lymph: No cervical or inguinal lymphadenopathy. Skin: No rashes, bruises or suspicious lesions. Neurologic: Grossly intact, no focal deficits, moving all 4 extremities. Psychiatric: Normal mood and affect.  Laboratory Data: Lab Results  Component Value Date   WBC 7.3 08/12/2021   HGB 7.4 (L) 08/12/2021   HCT 23.9 (L) 08/12/2021   MCV 91.6 08/12/2021   PLT 159 08/12/2021    Lab Results  Component Value Date   CREATININE 1.17 (H) 08/13/2021    No results found for: "PSA"  No results found for: "TESTOSTERONE"  Lab Results  Component Value Date   HGBA1C 7.4 (A) 01/29/2022    Urinalysis    Component Value  Date/Time   COLORURINE STRAW (A) 08/09/2021 0120   APPEARANCEUR Clear 01/16/2022 0841   LABSPEC 1.024 08/09/2021 0120   PHURINE 5.0 08/09/2021 0120   GLUCOSEU Negative 01/16/2022 0841   GLUCOSEU NEGATIVE 11/16/2018 1219   HGBUR NEGATIVE 08/09/2021 0120   BILIRUBINUR Negative 01/16/2022 0841   KETONESUR NEGATIVE 08/09/2021 0120   PROTEINUR 2+ (A) 01/16/2022 0841   PROTEINUR 100 (A) 08/09/2021 0120   UROBILINOGEN 0.2 11/16/2018 1219   NITRITE Negative 01/16/2022 0841   NITRITE  NEGATIVE 08/09/2021 0120   LEUKOCYTESUR Negative 01/16/2022 0841   LEUKOCYTESUR NEGATIVE 08/09/2021 0120    Lab Results  Component Value Date   LABMICR See below: 01/16/2022   WBCUA 0-5 01/16/2022   LABEPIT 0-10 01/16/2022   MUCUS Present 01/16/2022   BACTERIA Few (A) 01/16/2022    Pertinent Imaging:  No results found for this or any previous visit.  No results found for this or any previous visit.  No results found for this or any previous visit.  No results found for this or any previous visit.  No results found for this or any previous visit.  No results found for this or any previous visit.  No results found for this or any previous visit.  No results found for this or any previous visit.   Assessment & Plan:    1. Recurrent UTI -continue observation - Urinalysis, Routine w reflex microscopic  2. Urge incontinence of urine -continue gemtesa 57m daily   No follow-ups on file.  PNicolette Bang MD  [facility] [place]

## 2022-05-12 NOTE — Patient Instructions (Signed)

## 2022-05-14 LAB — URINALYSIS, ROUTINE W REFLEX MICROSCOPIC
Bilirubin, UA: NEGATIVE
Glucose, UA: NEGATIVE
Ketones, UA: NEGATIVE
Leukocytes,UA: NEGATIVE
Nitrite, UA: NEGATIVE
RBC, UA: NEGATIVE
Specific Gravity, UA: 1.02 (ref 1.005–1.030)
Urobilinogen, Ur: 0.2 mg/dL (ref 0.2–1.0)
pH, UA: 7 (ref 5.0–7.5)

## 2022-05-14 LAB — MICROSCOPIC EXAMINATION: RBC, Urine: NONE SEEN /hpf (ref 0–2)

## 2022-05-20 DIAGNOSIS — F331 Major depressive disorder, recurrent, moderate: Secondary | ICD-10-CM | POA: Diagnosis not present

## 2022-05-20 DIAGNOSIS — G6289 Other specified polyneuropathies: Secondary | ICD-10-CM | POA: Diagnosis not present

## 2022-05-20 DIAGNOSIS — D649 Anemia, unspecified: Secondary | ICD-10-CM | POA: Diagnosis not present

## 2022-05-20 DIAGNOSIS — E7849 Other hyperlipidemia: Secondary | ICD-10-CM | POA: Diagnosis not present

## 2022-05-20 DIAGNOSIS — I10 Essential (primary) hypertension: Secondary | ICD-10-CM | POA: Diagnosis not present

## 2022-05-20 DIAGNOSIS — Z6824 Body mass index (BMI) 24.0-24.9, adult: Secondary | ICD-10-CM | POA: Diagnosis not present

## 2022-05-20 DIAGNOSIS — Z7409 Other reduced mobility: Secondary | ICD-10-CM | POA: Diagnosis not present

## 2022-05-20 DIAGNOSIS — E1142 Type 2 diabetes mellitus with diabetic polyneuropathy: Secondary | ICD-10-CM | POA: Diagnosis not present

## 2022-05-20 DIAGNOSIS — E039 Hypothyroidism, unspecified: Secondary | ICD-10-CM | POA: Diagnosis not present

## 2022-05-26 DIAGNOSIS — Z9181 History of falling: Secondary | ICD-10-CM | POA: Diagnosis not present

## 2022-05-26 DIAGNOSIS — M6281 Muscle weakness (generalized): Secondary | ICD-10-CM | POA: Diagnosis not present

## 2022-05-26 DIAGNOSIS — M545 Low back pain, unspecified: Secondary | ICD-10-CM | POA: Diagnosis not present

## 2022-05-28 DIAGNOSIS — M545 Low back pain, unspecified: Secondary | ICD-10-CM | POA: Diagnosis not present

## 2022-05-28 DIAGNOSIS — M6281 Muscle weakness (generalized): Secondary | ICD-10-CM | POA: Diagnosis not present

## 2022-05-28 DIAGNOSIS — Z9181 History of falling: Secondary | ICD-10-CM | POA: Diagnosis not present

## 2022-05-30 DIAGNOSIS — M25511 Pain in right shoulder: Secondary | ICD-10-CM | POA: Diagnosis not present

## 2022-06-04 DIAGNOSIS — M545 Low back pain, unspecified: Secondary | ICD-10-CM | POA: Diagnosis not present

## 2022-06-04 DIAGNOSIS — M6281 Muscle weakness (generalized): Secondary | ICD-10-CM | POA: Diagnosis not present

## 2022-06-04 DIAGNOSIS — Z9181 History of falling: Secondary | ICD-10-CM | POA: Diagnosis not present

## 2022-06-05 ENCOUNTER — Encounter: Payer: Self-pay | Admitting: Neurology

## 2022-06-05 ENCOUNTER — Ambulatory Visit: Payer: Medicare PPO | Admitting: Neurology

## 2022-06-05 VITALS — BP 183/77 | HR 78 | Wt 133.0 lb

## 2022-06-05 DIAGNOSIS — G309 Alzheimer's disease, unspecified: Secondary | ICD-10-CM

## 2022-06-05 DIAGNOSIS — R413 Other amnesia: Secondary | ICD-10-CM

## 2022-06-05 DIAGNOSIS — F0280 Dementia in other diseases classified elsewhere without behavioral disturbance: Secondary | ICD-10-CM

## 2022-06-05 MED ORDER — MEMANTINE HCL 28 X 5 MG & 21 X 10 MG PO TABS: ORAL_TABLET | ORAL | 12 refills | Status: DC

## 2022-06-05 MED ORDER — MEMANTINE HCL 10 MG PO TABS: 10.0000 mg | ORAL_TABLET | Freq: Two times a day (BID) | ORAL | 3 refills | Status: DC

## 2022-06-05 NOTE — Patient Instructions (Signed)
I had a long discussion with the patient, husband and daughter regarding her cognitive impairment and dementia which appears to have progressed despite starting Aricept on today's testing.  I recommend a trial of Namenda starter pack and if tolerated without side effects 10 mg twice daily in addition to staying on Aricept.  We also discussed memory compensation strategies and increasing participation in cognitively challenging activities like solving crossword puzzles, playing bridge and sudoku.  She was also counseled to use a walker at all times and we discussed fall safety precautions.  She will return for follow-up in the future in 3 months with my nurse practitioner call earlier if necessary.  Fall Prevention in the Home, Adult Falls can cause injuries and can happen to people of all ages. There are many things you can do to make your home safe and to help prevent falls. Ask for help when making these changes. What actions can I take to prevent falls? General Instructions Use good lighting in all rooms. Replace any light bulbs that burn out. Turn on the lights in dark areas. Use night-lights. Keep items that you use often in easy-to-reach places. Lower the shelves around your home if needed. Set up your furniture so you have a clear path. Avoid moving your furniture around. Do not have throw rugs or other things on the floor that can make you trip. Avoid walking on wet floors. If any of your floors are uneven, fix them. Add color or contrast paint or tape to clearly mark and help you see: Grab bars or handrails. First and last steps of staircases. Where the edge of each step is. If you use a stepladder: Make sure that it is fully opened. Do not climb a closed stepladder. Make sure the sides of the stepladder are locked in place. Ask someone to hold the stepladder while you use it. Know where your pets are when moving through your home. What can I do in the bathroom?     Keep the floor  dry. Clean up any water on the floor right away. Remove soap buildup in the tub or shower. Use nonskid mats or decals on the floor of the tub or shower. Attach bath mats securely with double-sided, nonslip rug tape. If you need to sit down in the shower, use a plastic, nonslip stool. Install grab bars by the toilet and in the tub and shower. Do not use towel bars as grab bars. What can I do in the bedroom? Make sure that you have a light by your bed that is easy to reach. Do not use any sheets or blankets for your bed that hang to the floor. Have a firm chair with side arms that you can use for support when you get dressed. What can I do in the kitchen? Clean up any spills right away. If you need to reach something above you, use a step stool with a grab bar. Keep electrical cords out of the way. Do not use floor polish or wax that makes floors slippery. What can I do with my stairs? Do not leave any items on the stairs. Make sure that you have a light switch at the top and the bottom of the stairs. Make sure that there are handrails on both sides of the stairs. Fix handrails that are broken or loose. Install nonslip stair treads on all your stairs. Avoid having throw rugs at the top or bottom of the stairs. Choose a carpet that does not hide the edge  of the steps on the stairs. Check carpeting to make sure that it is firmly attached to the stairs. Fix carpet that is loose or worn. What can I do on the outside of my home? Use bright outdoor lighting. Fix the edges of walkways and driveways and fix any cracks. Remove anything that might make you trip as you walk through a door, such as a raised step or threshold. Trim any bushes or trees on paths to your home. Check to see if handrails are loose or broken and that both sides of all steps have handrails. Install guardrails along the edges of any raised decks and porches. Clear paths of anything that can make you trip, such as tools or  rocks. Have leaves, snow, or ice cleared regularly. Use sand or salt on paths during winter. Clean up any spills in your garage right away. This includes grease or oil spills. What other actions can I take? Wear shoes that: Have a low heel. Do not wear high heels. Have rubber bottoms. Feel good on your feet and fit well. Are closed at the toe. Do not wear open-toe sandals. Use tools that help you move around if needed. These include: Canes. Walkers. Scooters. Crutches. Review your medicines with your doctor. Some medicines can make you feel dizzy. This can increase your chance of falling. Ask your doctor what else you can do to help prevent falls. Where to find more information Centers for Disease Control and Prevention, [employer]: http://www.wolf.info/ National Institute on Aging: http://kim-miller.com/ Contact a doctor if: You are afraid of falling at home. You feel weak, drowsy, or dizzy at home. You fall at home. Summary There are many simple things that you can do to make your home safe and to help prevent falls. Ways to make your home safe include removing things that can make you trip and installing grab bars in the bathroom. Ask for help when making these changes in your home. This information is not intended to replace advice given to you by your health care provider. Make sure you discuss any questions you have with your health care provider. Document Revised: 06/17/2021 Document Reviewed: 04/18/2020 Elsevier Patient Education  [place].

## 2022-06-05 NOTE — Progress Notes (Signed)
[facility] [address] [place]. [place] [address] [phone]       OFFICE FOLLOW UP VISIT NOTE  Ms. [name] [name] Date of Birth:  12/07/1935 Medical Record Number:  [id number]   Referring MD: Vernell Leep  Reason for Referral: Confusion HPI: Initial visit 12/17/2020 [name] [name] is a pleasant 86 year old Caucasian lady seen today for initial office consultation visit for confusion and memory loss.  History is obtained from the patient and her husband and daughter were accompanying her as well as review of electronic medical records.  I personally reviewed pertinent available imaging films in PACS.  She has past medical history of diabetes, hypertension, hyperlipidemia, anemia who presented on 12/06/2020 to [facility] long emergency room with sudden onset of confusion described had trouble thinking and using the correct words for articulation.  She also had a blank look at speaking to her husband did not seem to understand them or had trouble finding words.  This lasted about 20 minutes and gradually started improving.  She was found to have urine tract infection for which she is started on antibiotics.  She complained of subsequent headache later as well.  Patient also had significantly elevated blood pressure of 176 systolic on admission.  She gradually improved.  Patient apparently and had a somewhat similar episode when she had a previous bout of urinary tract infection as well.  On inquiry the patient's husband states that she has noticed that she is having mild short-term memory difficulties for the last few years.  She is requiring more help with activities of daily living like dressing herself.  She remains a fall risk because of shoulder fracture as well as the left peripheral neuropathy.  She has recently started home physical outpatient speech therapy following recent hospital discharge.  She does have a strong family history of dementia in her sister as well as her mother.  She  denies any hallucinations, delusions, unsafe behavior or agitation.  She did have MRI scan of the brain done on 12/06/2020 which showed mild generalized atrophy and changes of small vessel disease no acute abnormality.  CT angiogram of the brain and neck, showed moderate left paraclinoid carotid stenosis as well as multifocal stenosis of the nondominant right vertebral artery.  Echocardiogram showed normal ejection fraction of 60 to 65%.  Carotid ultrasound was unremarkable.  LDL cholesterol 39 mg percent and hemoglobin A1c of 7.4.  She did not have an EEG done.  Lab work for reversible causes of memory loss was also not done. Update 02/14/2021 : She returns for follow-up after last visit 2 months ago.  She is accompanied by her son and her husband provide history.  Patient continues to have short-term memory difficulties particularly remembering recent information.  Her past memory seems fine.  She is quite independent at home.  She is supposed to use a cane but can walk independently without it indoors and uses a walker for outdoors.  She has had no falls or injuries.  She does get frustrated when she cannot remember things but there is no agitation, she has no delusions hallucinations.  She needs some help with the shower but otherwise can do most of her basic needs by herself.  She had lab work done at last visit which showed elevated TSH of 6.86 but vitamin B12, homocystine and RPR were normal.  She was asked to see primary care physician who has done some additional thyroid labs and she has an appointment in fact today it to  discuss treatment with him.  She had EEG done on 01/29/2021 which was normal.  Patient has been feels that her memory difficulties are unchanged.  She has no new complaints. Update 05/21/2021 : She returns for follow-up after last visit 3 months ago.  She is accompanied by her husband and daughter.  She continues to have short-term memory difficulties but these family feels unchanged.  She  has some good days and bad days.  She still is mostly independent in activities of daily living and does not need much supervision.  There have been no delusions, hallucinations or unsafe behaviors noted.  She is pretty calm and does not get agitated and there has been no violent behavior noted.  On Mini-Mental status exam today she scored 26/30 which is actually declined from 28/30 and March 2022. Update 06/05/2022 : She returns for follow-up after last visit a year ago.  She missed her follow-up appointment in May as she was sick.  She is accompanied by her daughter and husband.  They feel that she is doing about the same.  She was started on Aricept by me at last visit and she is tolerating it well without side effects.  She is still living at home with her husband.  She continues to have short-term memory difficulties but is able to ambulate by herself she uses a walker.  She has had a few falls.  She is currently undergoing outpatient physical therapy which seems to have helped with her gait and balance.  She uses a walker if she remembers and needs constant reminders for the same.  She does not go out a lot and socialize and occasionally complains she is feeling depressed.  She denies any delusions, hallucinations, agitation or violent behavior.  On Mini-Mental status exam testing today she scored 20/30 which actually is a decline from 26/30 at last visit. ROS:   14 system review of systems is positive for decreased socialization, depression, confusion, memory loss, word finding difficulty and all other systems negative  PMH:  Past Medical History:  Diagnosis Date   Anemia    Anxiety    Arthritis    Chronic back pain    Chronic diarrhea    Diabetes (HCC)    Diverticulosis    GERD (gastroesophageal reflux disease)    Hyperlipidemia    Hypertension    Internal hemorrhoids    UTI (urinary tract infection)     Social History:  Social History   Socioeconomic History   Marital status: Married     Spouse name: Lobbyist   Number of children: 3   Years of education: Not on file   Highest education level: Not on file  Occupational History   Not on file  Tobacco Use   Smoking status: Never   Smokeless tobacco: Never  Vaping Use   Vaping Use: Never used  Substance and Sexual Activity   Alcohol use: No    Alcohol/week: 0.0 standard drinks of alcohol   Drug use: No   Sexual activity: Not on file  Other Topics Concern   Not on file  Social History Narrative   Lives at home with husband    Right Handed   Drinks 1-2 cups caffeine daily   Social Determinants of Health   Financial Resource Strain: Not on file  Food Insecurity: Not on file  Transportation Needs: Not on file  Physical Activity: Not on file  Stress: Not on file  Social Connections: Not on file  Intimate Partner Violence: Not  on file    Medications:   Current Outpatient Medications on File Prior to Visit  Medication Sig Dispense Refill   ACCU-CHEK GUIDE test strip USE AS DIRECTED THREE TIMES DAILY 150 strip 3   atorvastatin (LIPITOR) 40 MG tablet Take 20 mg by mouth daily.      Blood Glucose Monitoring Suppl (ACCU-CHEK GUIDE) w/Device KIT Use to check blood sugar  times a day 1 kit 0   Calcium Citrate-Vitamin D 200-250 MG-UNIT TABS Take 1 tablet by mouth daily.     colestipol (COLESTID) 1 g tablet Take 1 tablet (1 g total) by mouth 2 (two) times daily. 180 tablet 3   COMFORT EZ PEN NEEDLES 31G X 5 MM MISC      Cyanocobalamin (B-12) 1000 MCG TABS Take 1,000 mcg by mouth daily.      diclofenac sodium (VOLTAREN) 1 % GEL Apply 2 g topically at bedtime.     donepezil (ARICEPT) 10 MG tablet Take 1 tablet (10 mg total) by mouth at bedtime. Please call and make overdue appt for further refills. 1st attempt 30 tablet 0   DULoxetine (CYMBALTA) 60 MG capsule Take 60 mg by mouth daily.     enalapril (VASOTEC) 20 MG tablet Take 10 mg by mouth 2 (two) times daily.     ferrous sulfate 325 (65 FE) MG EC tablet Take 325 mg by  mouth daily with breakfast.     hydrochlorothiazide (HYDRODIURIL) 12.5 MG tablet Take 12.5 mg by mouth daily.     labetalol (NORMODYNE) 200 MG tablet Take 300 mg by mouth 2 (two) times daily.     Lactobacillus Rhamnosus, GG, (RA PROBIOTIC DIGESTIVE CARE PO) Take 1 capsule by mouth daily.     levothyroxine (SYNTHROID) 25 MCG tablet Take 25 mcg by mouth daily before breakfast.     Multiple Vitamins-Minerals (CENTRUM) tablet Take 1 tablet by mouth daily.     nitrofurantoin (MACRODANTIN) 50 MG capsule Take 1 capsule (50 mg total) by mouth at bedtime. 30 capsule 11   sertraline (ZOLOFT) 25 MG tablet Take 25 mg by mouth daily.     SOLIQUA 100-33 UNT-MCG/ML SOPN INJECT 22 UNITS SQ DAILY BEFORE BREAKFast (Patient taking differently: Inject 18 Units into the skin daily.) 15 mL 1   Vibegron (GEMTESA) 75 MG TABS Take 1 capsule by mouth daily. 30 tablet 11   No current facility-administered medications on file prior to visit.    Allergies:  No Known Allergies  Physical Exam General: well developed, well nourished pleasant elderly Caucasian lady, seated, in no evident distress Head: head normocephalic and atraumatic.   Neck: supple with no carotid or supraclavicular bruits Cardiovascular: regular rate and rhythm, no murmurs Musculoskeletal: no deformity Skin:  no rash/petichiae Vascular:  Normal pulses all extremities  Neurologic Exam Mental Status: Awake and fully alert. Oriented to place and time. Recent and remote memory intact. Attention span, concentration and fund of knowledge appropriate. Mood and affect appropriate.  Mini-Mental status exam 20/30 this visit (last visit score 28/30 ) diminished recall 1/3.  Clock drawing 3/4.  Difficulty with copying intersecting pentagons.  Able to name only 5 animals which can walk on 4 legs Cranial Nerves: Fundoscopic exam not done pupils equal, briskly reactive to light. Extraocular movements full without nystagmus. Visual fields full to confrontation.  Hearing intact. Facial sensation intact. Face, tongue, palate moves normally and symmetrically.  Motor: Normal bulk and tone. Normal strength in all tested extremity muscles except mild ankle dorsiflexor weakness bilaterally.. Sensory.: i diminished touch ,  pinprick , position and vibratory sensation from ankle down bilaterally.  Positive Romberg's test. Coordination: Rapid alternating movements normal in all extremities. Finger-to-nose and heel-to-shin performed accurately bilaterally. Gait and Station: Arises from chair without difficulty. Stance is normal. Gait demonstrates slight broad-based and mild ataxia and uses a cane.  Unsteady while standing on either foot unsupported.. Able to heel, toe and tandem walk with moderate difficulty.  Reflexes: 1+ and symmetric except ankle jerks are depressed. Toes downgoing.        06/05/2022    2:50 PM 05/21/2021    1:43 PM 12/17/2020   10:53 AM  MMSE - Mini Mental State Exam  Orientation to time '4 5 5  ' Orientation to Place '5 5 5  ' Registration '3 3 3  ' Attention/ Calculation 0 5 5  Recall 1 0 2  Language- name 2 objects '2 2 2  ' Language- repeat 0 1 0  Language- follow 3 step command '3 3 3  ' Language- read & follow direction '1 1 1  ' Write a sentence '1 1 1  ' Copy design 0 0 1  Copy design-comments  4 legged animals x 1 minute: 3   Total score '20 26 28     ' ASSESSMENT: 86 year old lady with episode of transient confusion in the setting of urinary tract infection likely decompensation in a patient with mild cognitive impairment now progressed to mild  dementia.  Strong family history of Alzheimer's.  Slight decline on cognitive evaluation scale today likely suggestive of progressive mild Alzheimer's     PLAN: I had a long discussion with the patient, husband and daughter regarding her cognitive impairment and dementia which appears to have progressed despite starting Aricept on today's testing.  I recommend a trial of Namenda starter pack and if  tolerated without side effects 10 mg twice daily in addition to staying on Aricept.  We also discussed memory compensation strategies and increasing participation in cognitively challenging activities like solving crossword puzzles, playing bridge and sudoku.  She was also counseled to use a walker at all times and we discussed fall safety precautions.  She will return for follow-up in the future in 3 months with my nurse practitioner call earlier if necessary.Greater than 50% time during this 45 -minute prolonged visit was spent on counseling and coordination of care about her episodes of confusion and baseline memory loss and dementia and answering questions Antony Contras, MD  [facility] [address] [place], [place] [address]  Phone [phone] Fax [phone] Note: This document was prepared with digital dictation and possible smart phrase technology. Any transcriptional errors that result from this process are unintentional.

## 2022-06-06 DIAGNOSIS — Z9181 History of falling: Secondary | ICD-10-CM | POA: Diagnosis not present

## 2022-06-06 DIAGNOSIS — M6281 Muscle weakness (generalized): Secondary | ICD-10-CM | POA: Diagnosis not present

## 2022-06-06 DIAGNOSIS — M545 Low back pain, unspecified: Secondary | ICD-10-CM | POA: Diagnosis not present

## 2022-06-11 DIAGNOSIS — M545 Low back pain, unspecified: Secondary | ICD-10-CM | POA: Diagnosis not present

## 2022-06-11 DIAGNOSIS — Z9181 History of falling: Secondary | ICD-10-CM | POA: Diagnosis not present

## 2022-06-11 DIAGNOSIS — M6281 Muscle weakness (generalized): Secondary | ICD-10-CM | POA: Diagnosis not present

## 2022-06-13 DIAGNOSIS — Z9181 History of falling: Secondary | ICD-10-CM | POA: Diagnosis not present

## 2022-06-13 DIAGNOSIS — M545 Low back pain, unspecified: Secondary | ICD-10-CM | POA: Diagnosis not present

## 2022-06-13 DIAGNOSIS — M6281 Muscle weakness (generalized): Secondary | ICD-10-CM | POA: Diagnosis not present

## 2022-06-13 IMAGING — CT CT ANGIO NECK
2 of 7 series · 8 of 33 positions shown · IV contrast (omnipaque)
Comparison: CT head and MRI from December 06, 2020.

CLINICAL DATA: Neuro deficit, acute stroke suspected. Difficulty
with speech.

EXAM:
CT ANGIOGRAPHY HEAD AND NECK
TECHNIQUE: Multidetector CT imaging of the head and neck was performed using
the standard protocol during bolus administration of intravenous
contrast. Multiplanar CT image reconstructions and MIPs were
obtained to evaluate the vascular anatomy. Carotid stenosis
measurements (when applicable) are obtained utilizing NASCET
criteria, using the distal internal carotid diameter as the
denominator.
CONTRAST:  75mL OMNIPAQUE IOHEXOL 350 MG/ML SOLN

[Series 5: cta head neck · axial · 0.43mm/px · z∈[-258,-152]mm · 2 of 159 slices shown]
[im 53/159  soft-tissue]
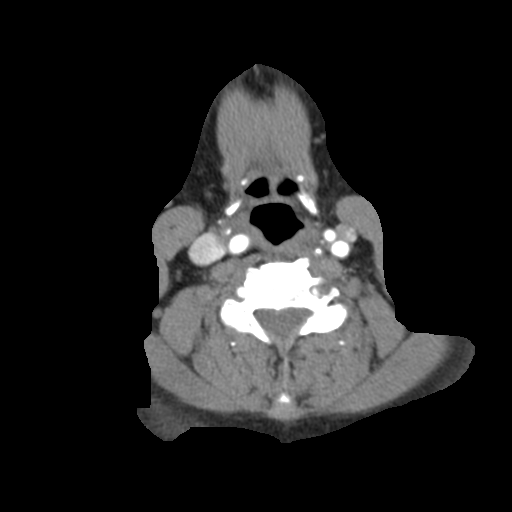
[im 106/159  soft-tissue]
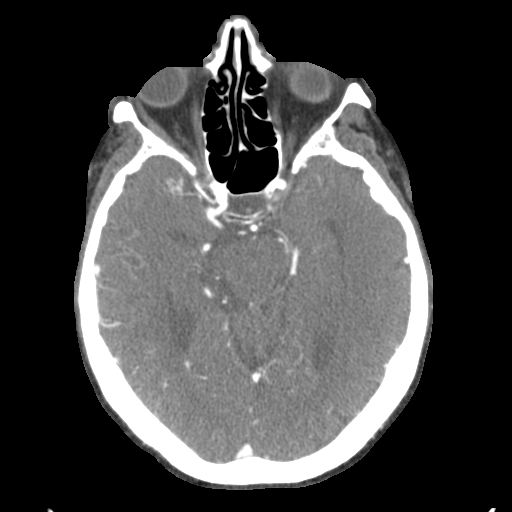

[Series 7: ax thin · axial · 0.34mm/px · z∈[-316,-90]mm · 6 of 316 slices shown]
[im 46/316  soft-tissue]
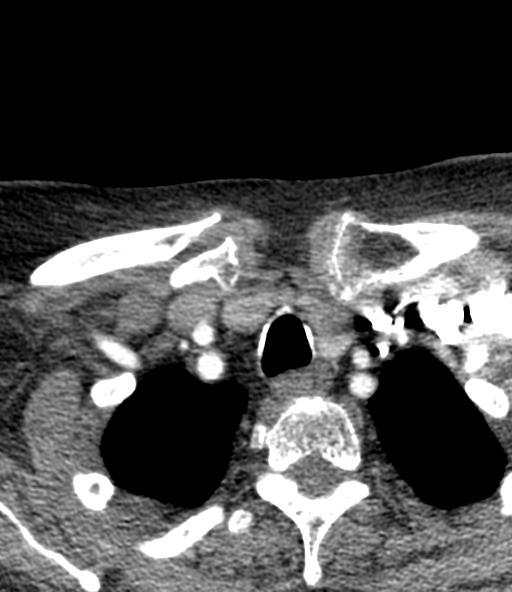
[im 91/316  bone]
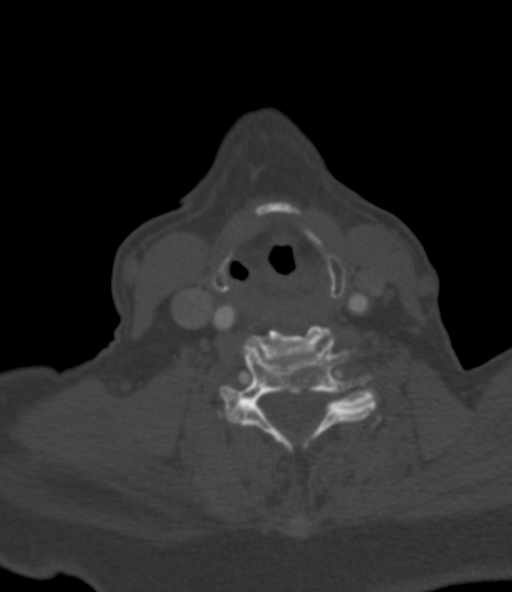
[im 136/316  soft-tissue]
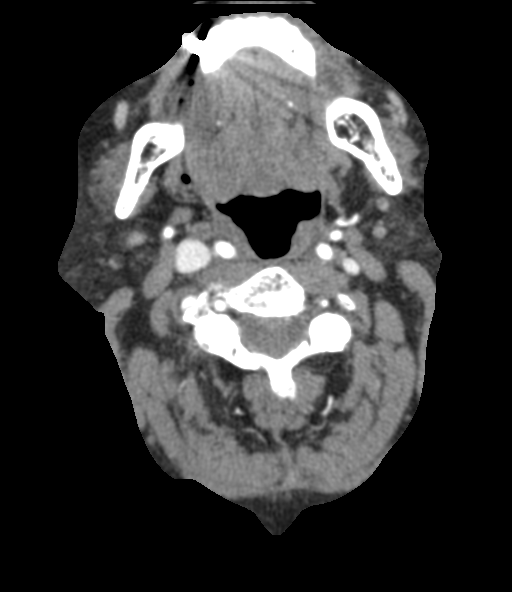
[im 181/316  bone]
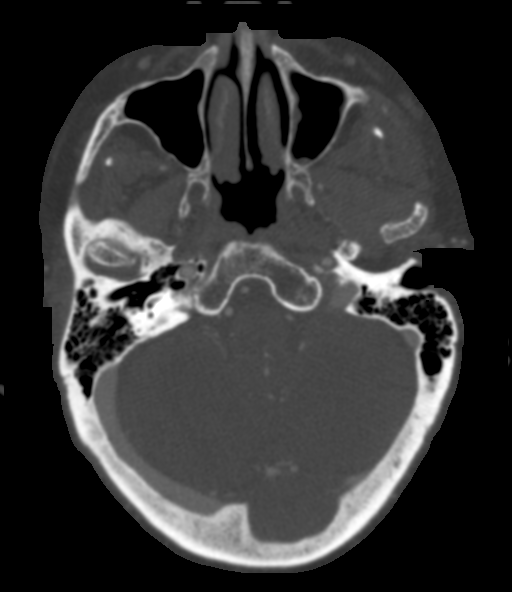
[im 226/316  soft-tissue]
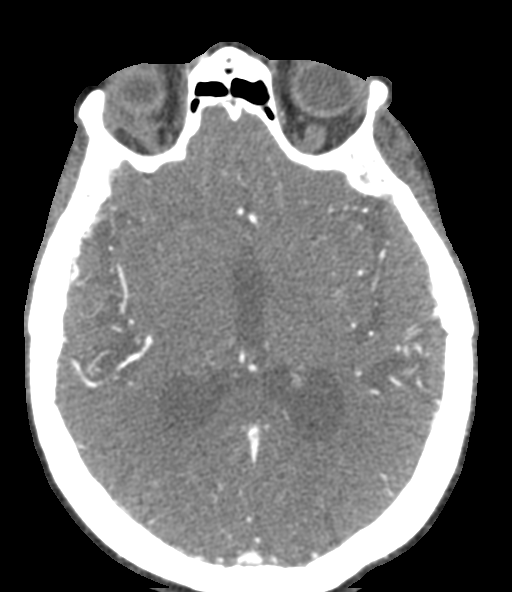
[im 271/316  bone]
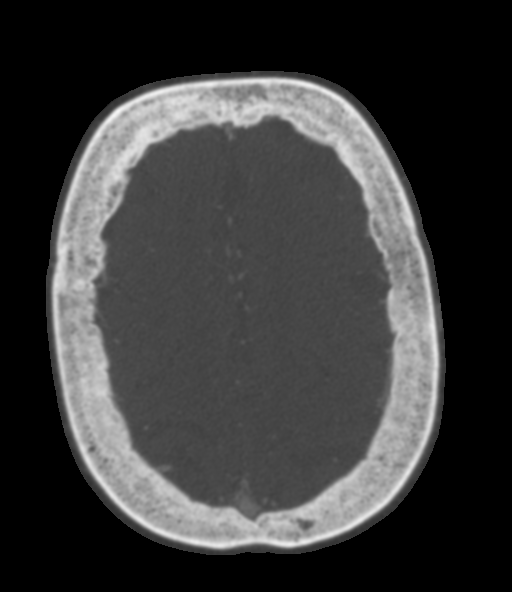

[8 of 33 positions shown; findings below may reference images not displayed]

FINDINGS: CTA NECK FINDINGS

Aortic arch: Calcific atherosclerosis. Great vessel origins are
patent without evidence of hemodynamically significant proximal
stenosis.

Right carotid system: No evidence of dissection, stenosis (50% or
greater) or occlusion. Tortuous ICA.

Left carotid system: No evidence of dissection, stenosis (50% or
greater) or occlusion.

Vertebral arteries: Right dominant. Severe stenosis of the proximal
left vertebral artery (series 7, image 72) with additional
multifocal moderate stenosis (including at C5-C6 on series 7, image
93) and severe stenosis versus occlusion at C4 level (series 7,
image 116). The dominant right vertebral artery is patent with mild
narrowing secondary to impression from degenerative osteophytes.

Skeleton: Severe degenerative disc disease at C4-C5 C5-C6 and C6-C7
with disc height loss, endplate sclerosis/cystic change, and
posterior disc osteophyte complexes. No acute abnormality
identified.

Other neck: Bilateral thyroid nodules, calcified on the right,
measuring up to 1.1 cm. These do not require further imaging
follow-up per current guidelines. Otherwise, no masses or suspicious
adenopathy.

Upper chest: Visualized lung apices are clear.

Review of the MIP images confirms the above findings

CTA HEAD FINDINGS

Anterior circulation: Predominately calcific atherosclerosis of
bilateral cavernous and paraclinoid ICAs. Moderate left paraclinoid
ICA stenosis. Bilateral MCAs and ACAs are patent. Bilateral PCOM
infundibula without discrete aneurysm.

Posterior circulation: Small intradural left vertebral artery which
makes a small contribution to the basilar artery. Dominant right
intradural vertebral artery is patent. The basilar artery is patent
without evidence of significant stenosis. Right fetal type PCA with
small right P1 PCA. Bilateral PCAs are patent with multifocal mild
stenosis. No aneurysm.

Venous sinuses: As permitted by contrast timing, patent. Small left
transverse sinus.

Review of the MIP images confirms the above findings
IMPRESSION: 1. No emergent intracranial large vessel occlusion.
2. Moderate stenosis of the left paraclinoid ICA.
3. Multifocal stenosis of the non-dominant vertebral artery
including severe stenosis proximally and severe stenosis versus
occlusion at the C4 level.

## 2022-06-16 DIAGNOSIS — Z9181 History of falling: Secondary | ICD-10-CM | POA: Diagnosis not present

## 2022-06-16 DIAGNOSIS — M545 Low back pain, unspecified: Secondary | ICD-10-CM | POA: Diagnosis not present

## 2022-06-16 DIAGNOSIS — M6281 Muscle weakness (generalized): Secondary | ICD-10-CM | POA: Diagnosis not present

## 2022-06-17 ENCOUNTER — Ambulatory Visit: Payer: Medicare PPO | Admitting: Endocrinology

## 2022-06-18 DIAGNOSIS — M6281 Muscle weakness (generalized): Secondary | ICD-10-CM | POA: Diagnosis not present

## 2022-06-18 DIAGNOSIS — M545 Low back pain, unspecified: Secondary | ICD-10-CM | POA: Diagnosis not present

## 2022-06-18 DIAGNOSIS — Z9181 History of falling: Secondary | ICD-10-CM | POA: Diagnosis not present

## 2022-06-23 DIAGNOSIS — Z9181 History of falling: Secondary | ICD-10-CM | POA: Diagnosis not present

## 2022-06-23 DIAGNOSIS — M545 Low back pain, unspecified: Secondary | ICD-10-CM | POA: Diagnosis not present

## 2022-06-23 DIAGNOSIS — M6281 Muscle weakness (generalized): Secondary | ICD-10-CM | POA: Diagnosis not present

## 2022-06-25 DIAGNOSIS — M6281 Muscle weakness (generalized): Secondary | ICD-10-CM | POA: Diagnosis not present

## 2022-06-25 DIAGNOSIS — M545 Low back pain, unspecified: Secondary | ICD-10-CM | POA: Diagnosis not present

## 2022-06-25 DIAGNOSIS — Z9181 History of falling: Secondary | ICD-10-CM | POA: Diagnosis not present

## 2022-06-26 ENCOUNTER — Encounter: Payer: Self-pay | Admitting: Endocrinology

## 2022-06-26 ENCOUNTER — Ambulatory Visit: Payer: Medicare PPO | Admitting: Endocrinology

## 2022-06-26 VITALS — BP 128/70 | HR 74 | Ht 63.0 in | Wt 134.6 lb

## 2022-06-26 DIAGNOSIS — E1165 Type 2 diabetes mellitus with hyperglycemia: Secondary | ICD-10-CM | POA: Diagnosis not present

## 2022-06-26 DIAGNOSIS — Z794 Long term (current) use of insulin: Secondary | ICD-10-CM | POA: Diagnosis not present

## 2022-06-26 DIAGNOSIS — L03032 Cellulitis of left toe: Secondary | ICD-10-CM | POA: Diagnosis not present

## 2022-06-26 LAB — POCT GLYCOSYLATED HEMOGLOBIN (HGB A1C): Hemoglobin A1C: 6.9 % — AB (ref 4.0–5.6)

## 2022-06-26 MED ORDER — DEXCOM G7 RECEIVER DEVI: 0 refills | Status: DC

## 2022-06-26 MED ORDER — AMOXICILLIN-POT CLAVULANATE 500-125 MG PO TABS: 1.0000 | ORAL_TABLET | Freq: Two times a day (BID) | ORAL | 0 refills | Status: DC

## 2022-06-26 MED ORDER — DEXCOM G7 SENSOR MISC: 1.0000 | 3 refills | Status: DC

## 2022-06-26 NOTE — Progress Notes (Signed)
Patient ID: [name] [name], female   DOB: [birth year], 86 y.o.   MRN: [id number]           Reason for Appointment: Follow-up for Type 2 Diabetes   History of Present Illness:          Date of diagnosis of type 2 diabetes mellitus: At age 22   approximately      Background history:  She is a poor historian and not clear when she was started on insulin, probably at least since 2018 She thinks she has been on metformin in the past but this was causing diarrhea She does not know what other medications she has taken No prior A1c records are available except from 11/18  Recent history:   A1c is slightly better at 6.9, previously was at 7.4  INSULIN regimen is: Soliqua 18 units in am      Non-insulin hypoglycemic drugs the patient is taking are: None  Current management, blood sugar patterns and problems identified:   As before she only checks her blood sugars in the mornings and not consistently also Blood sugars do fluctuate with occasional significantly high reading Not clear if she is occasionally missing her injection However she has likely double dosed her injection in the morning months causing symptomatic low blood sugar in the 40s according to her family member today Her weight is about the same She thinks she is generally eating 3 meals and on time        Side effects from medications have been: Diarrhea from metformin  :    Typical meal intake: Breakfast is   toast, eggs and meat, lunch usually half sandwich with fruit and sweet tea, dinner meat, potato, salad and tea.  Mostly snacks are fruits               Glucose monitoring:  Done 1 times a day         Glucometer:  Accu-Chek     Blood Glucose readings by monitor review  FASTING blood sugars range 117-249, AVG 147 for 30 days  Previously 106-415 with average for 30 days = 158   Dietician visit, most recent: Few years ago  Weight history:  Wt Readings from Last 3 Encounters:  06/26/22 134 lb 9.6 oz (61.1 kg)   06/05/22 133 lb (60.3 kg)  05/12/22 133 lb (60.3 kg)    Glycemic control: A1c in 11/19 was 8.3    Lab Results  Component Value Date   HGBA1C 6.9 (A) 06/26/2022   HGBA1C 7.4 (A) 01/29/2022   HGBA1C 7.1 (A) 06/12/2021   Lab Results  Component Value Date   MICROALBUR 28.2 (H) 09/06/2020   LDLCALC 39 12/07/2020   CREATININE 1.17 (H) 08/13/2021   Lab Results  Component Value Date   MICRALBCREAT 55.6 (H) 09/06/2020    No results found for: "FRUCTOSAMINE"  Office Visit on 06/26/2022  Component Date Value Ref Range Status   Hemoglobin A1C 06/26/2022 6.9 (A)  4.0 - 5.6 % Final    Allergies as of 06/26/2022   No Known Allergies      Medication List        Accurate as of June 26, 2022  9:05 PM. If you have any questions, ask your nurse or doctor.          Accu-Chek Guide test strip Generic drug: glucose blood USE AS DIRECTED THREE TIMES DAILY   Accu-Chek Guide w/Device Kit Use to check blood sugar  times a day  amoxicillin-clavulanate 500-125 MG tablet Commonly known as: AUGMENTIN Take 1 tablet (500 mg total) by mouth 2 (two) times daily. Started by: Elayne Snare, MD   atorvastatin 40 MG tablet Commonly known as: LIPITOR Take 20 mg by mouth daily.   B-12 1000 MCG Tabs Take 1,000 mcg by mouth daily.   Calcium Citrate-Vitamin D 200-250 MG-UNIT Tabs Take 1 tablet by mouth daily.   Centrum tablet Take 1 tablet by mouth daily.   colestipol 1 g tablet Commonly known as: COLESTID Take 1 tablet (1 g total) by mouth 2 (two) times daily.   Comfort EZ Pen Needles 31G X 5 MM Misc Generic drug: Insulin Pen Needle   diclofenac sodium 1 % Gel Commonly known as: VOLTAREN Apply 2 g topically at bedtime.   donepezil 10 MG tablet Commonly known as: ARICEPT Take 1 tablet (10 mg total) by mouth at bedtime. Please call and make overdue appt for further refills. 1st attempt   DULoxetine 60 MG capsule Commonly known as: CYMBALTA Take 60 mg by mouth daily.    enalapril 20 MG tablet Commonly known as: VASOTEC Take 10 mg by mouth 2 (two) times daily.   ferrous sulfate 325 (65 FE) MG EC tablet Take 325 mg by mouth daily with breakfast.   Gemtesa 75 MG Tabs Generic drug: Vibegron Take 1 capsule by mouth daily.   hydrochlorothiazide 12.5 MG tablet Commonly known as: HYDRODIURIL Take 12.5 mg by mouth daily.   labetalol 200 MG tablet Commonly known as: NORMODYNE Take 300 mg by mouth 2 (two) times daily.   levothyroxine 25 MCG tablet Commonly known as: SYNTHROID Take 25 mcg by mouth daily before breakfast.   memantine tablet pack Commonly known as: Namenda Titration Pak 5 mg/day for =1 week; 5 mg twice daily for =1 week; 15 mg/day given in 5 mg and 10 mg separated doses for =1 week; then 10 mg twice daily   memantine 10 MG tablet Commonly known as: Namenda Take 1 tablet (10 mg total) by mouth 2 (two) times daily.   nitrofurantoin 50 MG capsule Commonly known as: MACRODANTIN Take 1 capsule (50 mg total) by mouth at bedtime.   RA PROBIOTIC DIGESTIVE CARE PO Take 1 capsule by mouth daily.   sertraline 25 MG tablet Commonly known as: ZOLOFT Take 25 mg by mouth daily.   Soliqua 100-33 UNT-MCG/ML Sopn Generic drug: Insulin Glargine-Lixisenatide INJECT 22 UNITS SQ DAILY BEFORE BREAKFast What changed: See the new instructions.        Allergies: No Known Allergies  Past Medical History:  Diagnosis Date   Anemia    Anxiety    Arthritis    Chronic back pain    Chronic diarrhea    Diabetes ([place])    Diverticulosis    GERD (gastroesophageal reflux disease)    Hyperlipidemia    Hypertension    Internal hemorrhoids    UTI (urinary tract infection)     Past Surgical History:  Procedure Laterality Date   ABDOMINAL HYSTERECTOMY     CARDIAC CATHETERIZATION     CATARACT EXTRACTION, BILATERAL     CHOLECYSTECTOMY     COLONOSCOPY  11/2015   Dr. Britta Mccreedy: hyperplastic polyp from sigmoid colon. biopsies from ascending colon were  negative for microscopic colitis.    COLONOSCOPY N/A 03/31/2018   Dr. Gala Romney: Diverticulosis, random colon biopsies negative, hemorrhoids.  No future screening or surveillance colonoscopy is recommended.   LAPAROTOMY N/A 08/08/2021   Procedure: EXPLORATORY LAPAROTOMY,  OVERSEW PERFORATED SMALL BOWEL;  Surgeon: Curlene Labrum  C, MD;  Location: [facility];  Service: General;  Laterality: N/A;   PARTIAL HIP ARTHROPLASTY Right    TONSILLECTOMY     TRIGGER FINGER RELEASE      Family History  Problem Relation Age of Onset   Heart attack Sister    Transient ischemic attack Sister    Heart attack Mother    Diabetes Mother    Transient ischemic attack Mother    Heart attack Father    Diabetes Son    Diabetes Maternal Aunt    Rectal cancer Other        anal CA   Colon cancer Neg Hx    Celiac disease Neg Hx    Inflammatory bowel disease Neg Hx    Stomach cancer Neg Hx     Social History:  reports that she has never smoked. She has never used smokeless tobacco. She reports that she does not drink alcohol and does not use drugs.   Review of Systems   Lipid history: Treated by PCP with 40 mg Lipitor long-term No recent labs available She is going to be now followed by a new PCP   Lab Results  Component Value Date   CHOL 114 12/07/2020   HDL 49 12/07/2020   LDLCALC 39 12/07/2020   TRIG 129 12/07/2020   CHOLHDL 2.3 12/07/2020           Hypertension: Has been present for several years treated by PCP, is on enalapril 20 mg, Normodyne 200 mg and hydrochlorothiazide   BP Readings from Last 3 Encounters:  06/26/22 128/70  06/05/22 (!) 183/77  05/12/22 128/69   Renal function history:  Lab Results  Component Value Date   CREATININE 1.17 (H) 08/13/2021   CREATININE 1.20 (H) 08/12/2021   CREATININE 1.27 (H) 08/11/2021    Currently known complications of diabetes: Diabetic peripheral neuropathy  Has symptoms of numbness in feet and fingers  She is asking about her left toe  turning dark this week, no recent injury She does have decreased sensation  THYROID: She has an occasional transient high TSH levels and has been treated with 25ug  Lab Results  Component Value Date   TSH 1.17 03/07/2021   TSH 6.860 (H) 12/17/2020   TSH 2.16 12/05/2020   FREET4 1.06 03/07/2021   FREET4 0.71 11/04/2019     Physical Examination:  BP 128/70   Pulse 74   Ht _0  (1.6 m)   Wt 134 lb 9.6 oz (61.1 kg)   SpO2 97%   BMI 23.84 kg/m      Mild ecchymosis on lateral second toe of the left foot Third through fifth toes are mildly warm and swollen compared to right foot with minimal redness no open lesions  Mild edema of the left foot also  ASSESSMENT:  Diabetes type 2, insulin requiring, with neuropathy  See history of present illness for detailed discussion of current diabetes management, blood sugar patterns and problems identified  Her A1c is slightly better at 6.9  Current regimen is [place] only  Overall control appears to be adequate on her blood sugars fasting are variable Occasionally will have significantly high readings possibly from missing her injection although she does not think so Would be useful to have some monitoring of her postprandial readings also Also discussed that if she has a CGM she will be alerted to low normal readings in advance including overnight No recent weight change  Cellulitis of left foot: She has early signs of cellulitis and may  have had some injury related to her neuropathy and sensory loss   PLAN:    Sensor and meter will be prescribed, her son can help her with starting this Discussed blood sugar targets at various times She may benefit from writing down in a diary when she takes her injection every morning Continue same dose of Soliqua  For her cellulitis she will start Augmentin 500 mg twice daily considering her age However if she is not improving over the weekend she will need to go to the emergency room She  will try to see her podiatrist on Monday  Follow-up in 4 months    There are no Patient Instructions on file for this visit.       Elayne Snare 06/26/2022, 9:05 PM   Note: This office note was prepared with Dragon voice recognition system technology. Any transcriptional errors that result from this process are unintentional.

## 2022-06-27 LAB — COMPREHENSIVE METABOLIC PANEL
ALT: 13 U/L (ref 0–35)
AST: 15 U/L (ref 0–37)
Albumin: 4.2 g/dL (ref 3.5–5.2)
Alkaline Phosphatase: 61 U/L (ref 39–117)
BUN: 36 mg/dL — ABNORMAL HIGH (ref 6–23)
CO2: 30 mEq/L (ref 19–32)
Calcium: 10.3 mg/dL (ref 8.4–10.5)
Chloride: 102 mEq/L (ref 96–112)
Creatinine, Ser: 1.48 mg/dL — ABNORMAL HIGH (ref 0.40–1.20)
GFR: 32.34 mL/min — ABNORMAL LOW (ref >60.00)
Glucose, Bld: 73 mg/dL (ref 70–99)
Potassium: 4.8 mEq/L (ref 3.5–5.1)
Sodium: 140 mEq/L (ref 135–145)
Total Bilirubin: 0.5 mg/dL (ref 0.2–1.2)
Total Protein: 7 g/dL (ref 6.0–8.3)

## 2022-06-30 DIAGNOSIS — Z9181 History of falling: Secondary | ICD-10-CM | POA: Diagnosis not present

## 2022-06-30 DIAGNOSIS — M545 Low back pain, unspecified: Secondary | ICD-10-CM | POA: Diagnosis not present

## 2022-06-30 DIAGNOSIS — M6281 Muscle weakness (generalized): Secondary | ICD-10-CM | POA: Diagnosis not present

## 2022-07-01 NOTE — Progress Notes (Signed)
Please call her family member, kidney test is worse than usual and needs to be evaluated by Dr. Quillian Quince, please fax report to him

## 2022-07-02 DIAGNOSIS — M6281 Muscle weakness (generalized): Secondary | ICD-10-CM | POA: Diagnosis not present

## 2022-07-02 DIAGNOSIS — M545 Low back pain, unspecified: Secondary | ICD-10-CM | POA: Diagnosis not present

## 2022-07-02 DIAGNOSIS — Z9181 History of falling: Secondary | ICD-10-CM | POA: Diagnosis not present

## 2022-07-07 DIAGNOSIS — Z23 Encounter for immunization: Secondary | ICD-10-CM | POA: Diagnosis not present

## 2022-07-07 DIAGNOSIS — Z7409 Other reduced mobility: Secondary | ICD-10-CM | POA: Diagnosis not present

## 2022-07-07 DIAGNOSIS — M6281 Muscle weakness (generalized): Secondary | ICD-10-CM | POA: Diagnosis not present

## 2022-07-07 DIAGNOSIS — I10 Essential (primary) hypertension: Secondary | ICD-10-CM | POA: Diagnosis not present

## 2022-07-07 DIAGNOSIS — Z9181 History of falling: Secondary | ICD-10-CM | POA: Diagnosis not present

## 2022-07-07 DIAGNOSIS — Z6824 Body mass index (BMI) 24.0-24.9, adult: Secondary | ICD-10-CM | POA: Diagnosis not present

## 2022-07-07 DIAGNOSIS — F331 Major depressive disorder, recurrent, moderate: Secondary | ICD-10-CM | POA: Diagnosis not present

## 2022-07-07 DIAGNOSIS — M545 Low back pain, unspecified: Secondary | ICD-10-CM | POA: Diagnosis not present

## 2022-07-07 DIAGNOSIS — E039 Hypothyroidism, unspecified: Secondary | ICD-10-CM | POA: Diagnosis not present

## 2022-07-07 DIAGNOSIS — E7849 Other hyperlipidemia: Secondary | ICD-10-CM | POA: Diagnosis not present

## 2022-07-07 DIAGNOSIS — D649 Anemia, unspecified: Secondary | ICD-10-CM | POA: Diagnosis not present

## 2022-07-08 DIAGNOSIS — B351 Tinea unguium: Secondary | ICD-10-CM | POA: Diagnosis not present

## 2022-07-08 DIAGNOSIS — M79676 Pain in unspecified toe(s): Secondary | ICD-10-CM | POA: Diagnosis not present

## 2022-07-08 DIAGNOSIS — E1142 Type 2 diabetes mellitus with diabetic polyneuropathy: Secondary | ICD-10-CM | POA: Diagnosis not present

## 2022-07-09 DIAGNOSIS — M545 Low back pain, unspecified: Secondary | ICD-10-CM | POA: Diagnosis not present

## 2022-07-09 DIAGNOSIS — Z9181 History of falling: Secondary | ICD-10-CM | POA: Diagnosis not present

## 2022-07-09 DIAGNOSIS — M6281 Muscle weakness (generalized): Secondary | ICD-10-CM | POA: Diagnosis not present

## 2022-07-14 DIAGNOSIS — M545 Low back pain, unspecified: Secondary | ICD-10-CM | POA: Diagnosis not present

## 2022-07-14 DIAGNOSIS — Z9181 History of falling: Secondary | ICD-10-CM | POA: Diagnosis not present

## 2022-07-14 DIAGNOSIS — M6281 Muscle weakness (generalized): Secondary | ICD-10-CM | POA: Diagnosis not present

## 2022-07-16 DIAGNOSIS — M6281 Muscle weakness (generalized): Secondary | ICD-10-CM | POA: Diagnosis not present

## 2022-07-16 DIAGNOSIS — M545 Low back pain, unspecified: Secondary | ICD-10-CM | POA: Diagnosis not present

## 2022-07-16 DIAGNOSIS — Z9181 History of falling: Secondary | ICD-10-CM | POA: Diagnosis not present

## 2022-07-17 DIAGNOSIS — R944 Abnormal results of kidney function studies: Secondary | ICD-10-CM | POA: Diagnosis not present

## 2022-07-21 DIAGNOSIS — M545 Low back pain, unspecified: Secondary | ICD-10-CM | POA: Diagnosis not present

## 2022-07-21 DIAGNOSIS — Z9181 History of falling: Secondary | ICD-10-CM | POA: Diagnosis not present

## 2022-07-21 DIAGNOSIS — M6281 Muscle weakness (generalized): Secondary | ICD-10-CM | POA: Diagnosis not present

## 2022-07-23 DIAGNOSIS — M6281 Muscle weakness (generalized): Secondary | ICD-10-CM | POA: Diagnosis not present

## 2022-07-23 DIAGNOSIS — M545 Low back pain, unspecified: Secondary | ICD-10-CM | POA: Diagnosis not present

## 2022-07-23 DIAGNOSIS — Z9181 History of falling: Secondary | ICD-10-CM | POA: Diagnosis not present

## 2022-08-02 ENCOUNTER — Other Ambulatory Visit: Payer: Self-pay | Admitting: Endocrinology

## 2022-08-04 ENCOUNTER — Telehealth: Payer: Self-pay

## 2022-08-04 NOTE — Telephone Encounter (Signed)
Thornton Park, MD  Carl Best, RN  Please encourage the patient to schedule the imaging study. Arrange office follow-up with me or an APP 3-4 weeks after the study to review the results.  Thanks.  KLB

## 2022-08-04 NOTE — Addendum Note (Signed)
Addended by: Bonner Puna H on: 08/04/2022 09:09 AM   Modules accepted: Orders

## 2022-08-04 NOTE — Telephone Encounter (Signed)
Spoke with patient & husband regarding MRI follow up. They asked that I contact their daughter to discus scheduling. Spoke with daughter [name] & she's been provided scheduling number for MRI if schedulers have not contacted them within the week. She is going to schedule follow up once MRI date has been set.

## 2022-08-22 ENCOUNTER — Other Ambulatory Visit (HOSPITAL_COMMUNITY): Payer: Medicare PPO

## 2022-08-29 ENCOUNTER — Ambulatory Visit (HOSPITAL_COMMUNITY)
Admission: RE | Admit: 2022-08-29 | Discharge: 2022-08-29 | Disposition: A | Discharge status: Home / Self Care | Payer: Medicare PPO | Source: Ambulatory Visit | Attending: Gastroenterology | Admitting: Gastroenterology

## 2022-08-29 ENCOUNTER — Other Ambulatory Visit: Payer: Self-pay | Admitting: Gastroenterology

## 2022-08-29 DIAGNOSIS — K862 Cyst of pancreas: Secondary | ICD-10-CM

## 2022-08-29 DIAGNOSIS — J9811 Atelectasis: Secondary | ICD-10-CM | POA: Diagnosis not present

## 2022-08-29 DIAGNOSIS — D7389 Other diseases of spleen: Secondary | ICD-10-CM | POA: Diagnosis not present

## 2022-08-29 DIAGNOSIS — D179 Benign lipomatous neoplasm, unspecified: Secondary | ICD-10-CM | POA: Diagnosis not present

## 2022-08-29 DIAGNOSIS — R935 Abnormal findings on diagnostic imaging of other abdominal regions, including retroperitoneum: Secondary | ICD-10-CM | POA: Diagnosis not present

## 2022-08-29 DIAGNOSIS — K863 Pseudocyst of pancreas: Secondary | ICD-10-CM | POA: Diagnosis not present

## 2022-08-29 MED ORDER — GADOBUTROL 1 MMOL/ML IV SOLN
6.0000 mL | Freq: Once | INTRAVENOUS | Status: AC | PRN
  Administered 2022-08-29: 6 mL via INTRAVENOUS

## 2022-09-05 ENCOUNTER — Telehealth: Payer: Self-pay

## 2022-09-05 NOTE — Telephone Encounter (Signed)
Patients's husband called advising that medication was working great and needed additional samples for medication below.   Medication: Gemtesa '75mg'$ 

## 2022-09-10 DIAGNOSIS — Z7983 Long term (current) use of bisphosphonates: Secondary | ICD-10-CM | POA: Diagnosis not present

## 2022-09-10 DIAGNOSIS — I6501 Occlusion and stenosis of right vertebral artery: Secondary | ICD-10-CM | POA: Diagnosis not present

## 2022-09-10 DIAGNOSIS — N179 Acute kidney failure, unspecified: Secondary | ICD-10-CM | POA: Diagnosis not present

## 2022-09-10 DIAGNOSIS — S2243XD Multiple fractures of ribs, bilateral, subsequent encounter for fracture with routine healing: Secondary | ICD-10-CM | POA: Diagnosis not present

## 2022-09-10 DIAGNOSIS — Z0001 Encounter for general adult medical examination with abnormal findings: Secondary | ICD-10-CM | POA: Diagnosis not present

## 2022-09-10 DIAGNOSIS — E039 Hypothyroidism, unspecified: Secondary | ICD-10-CM | POA: Diagnosis not present

## 2022-09-10 DIAGNOSIS — Z4659 Encounter for fitting and adjustment of other gastrointestinal appliance and device: Secondary | ICD-10-CM | POA: Diagnosis not present

## 2022-09-10 DIAGNOSIS — D649 Anemia, unspecified: Secondary | ICD-10-CM | POA: Diagnosis not present

## 2022-09-10 DIAGNOSIS — S138XXA Sprain of joints and ligaments of other parts of neck, initial encounter: Secondary | ICD-10-CM | POA: Diagnosis not present

## 2022-09-10 DIAGNOSIS — S065X9A Traumatic subdural hemorrhage with loss of consciousness of unspecified duration, initial encounter: Secondary | ICD-10-CM | POA: Diagnosis not present

## 2022-09-10 DIAGNOSIS — S0232XA Fracture of orbital floor, left side, initial encounter for closed fracture: Secondary | ICD-10-CM | POA: Diagnosis not present

## 2022-09-10 DIAGNOSIS — S065X0A Traumatic subdural hemorrhage without loss of consciousness, initial encounter: Secondary | ICD-10-CM | POA: Diagnosis not present

## 2022-09-10 DIAGNOSIS — M1612 Unilateral primary osteoarthritis, left hip: Secondary | ICD-10-CM | POA: Diagnosis not present

## 2022-09-10 DIAGNOSIS — S2243XA Multiple fractures of ribs, bilateral, initial encounter for closed fracture: Secondary | ICD-10-CM | POA: Diagnosis not present

## 2022-09-10 DIAGNOSIS — F331 Major depressive disorder, recurrent, moderate: Secondary | ICD-10-CM | POA: Diagnosis not present

## 2022-09-10 DIAGNOSIS — D631 Anemia in chronic kidney disease: Secondary | ICD-10-CM | POA: Diagnosis not present

## 2022-09-10 DIAGNOSIS — S14129D Central cord syndrome at unspecified level of cervical spinal cord, subsequent encounter: Secondary | ICD-10-CM | POA: Diagnosis not present

## 2022-09-10 DIAGNOSIS — S14129A Central cord syndrome at unspecified level of cervical spinal cord, initial encounter: Secondary | ICD-10-CM | POA: Diagnosis not present

## 2022-09-10 DIAGNOSIS — S12300A Unspecified displaced fracture of fourth cervical vertebra, initial encounter for closed fracture: Secondary | ICD-10-CM | POA: Diagnosis not present

## 2022-09-10 DIAGNOSIS — S2242XA Multiple fractures of ribs, left side, initial encounter for closed fracture: Secondary | ICD-10-CM | POA: Diagnosis not present

## 2022-09-10 DIAGNOSIS — E079 Disorder of thyroid, unspecified: Secondary | ICD-10-CM | POA: Diagnosis not present

## 2022-09-10 DIAGNOSIS — M25512 Pain in left shoulder: Secondary | ICD-10-CM | POA: Diagnosis not present

## 2022-09-10 DIAGNOSIS — W19XXXA Unspecified fall, initial encounter: Secondary | ICD-10-CM | POA: Diagnosis not present

## 2022-09-10 DIAGNOSIS — Z043 Encounter for examination and observation following other accident: Secondary | ICD-10-CM | POA: Diagnosis not present

## 2022-09-10 DIAGNOSIS — E785 Hyperlipidemia, unspecified: Secondary | ICD-10-CM | POA: Diagnosis not present

## 2022-09-10 DIAGNOSIS — I10 Essential (primary) hypertension: Secondary | ICD-10-CM | POA: Diagnosis not present

## 2022-09-10 DIAGNOSIS — F32A Depression, unspecified: Secondary | ICD-10-CM | POA: Diagnosis not present

## 2022-09-10 DIAGNOSIS — R0689 Other abnormalities of breathing: Secondary | ICD-10-CM | POA: Diagnosis not present

## 2022-09-10 DIAGNOSIS — N189 Chronic kidney disease, unspecified: Secondary | ICD-10-CM | POA: Diagnosis not present

## 2022-09-10 DIAGNOSIS — S065XAA Traumatic subdural hemorrhage with loss of consciousness status unknown, initial encounter: Secondary | ICD-10-CM | POA: Diagnosis not present

## 2022-09-10 DIAGNOSIS — W1839XA Other fall on same level, initial encounter: Secondary | ICD-10-CM | POA: Diagnosis not present

## 2022-09-10 DIAGNOSIS — Z66 Do not resuscitate: Secondary | ICD-10-CM | POA: Diagnosis not present

## 2022-09-10 DIAGNOSIS — E872 Acidosis, unspecified: Secondary | ICD-10-CM | POA: Diagnosis not present

## 2022-09-10 DIAGNOSIS — S12200A Unspecified displaced fracture of third cervical vertebra, initial encounter for closed fracture: Secondary | ICD-10-CM | POA: Diagnosis not present

## 2022-09-10 DIAGNOSIS — M79631 Pain in right forearm: Secondary | ICD-10-CM | POA: Diagnosis not present

## 2022-09-10 DIAGNOSIS — E559 Vitamin D deficiency, unspecified: Secondary | ICD-10-CM | POA: Diagnosis not present

## 2022-09-10 DIAGNOSIS — S301XXA Contusion of abdominal wall, initial encounter: Secondary | ICD-10-CM | POA: Diagnosis not present

## 2022-09-10 DIAGNOSIS — M25511 Pain in right shoulder: Secondary | ICD-10-CM | POA: Diagnosis not present

## 2022-09-10 DIAGNOSIS — M545 Low back pain, unspecified: Secondary | ICD-10-CM | POA: Diagnosis not present

## 2022-09-10 DIAGNOSIS — M25519 Pain in unspecified shoulder: Secondary | ICD-10-CM | POA: Diagnosis not present

## 2022-09-10 DIAGNOSIS — R509 Fever, unspecified: Secondary | ICD-10-CM | POA: Diagnosis not present

## 2022-09-10 DIAGNOSIS — S14157A Other incomplete lesion at C7 level of cervical spinal cord, initial encounter: Secondary | ICD-10-CM | POA: Diagnosis not present

## 2022-09-10 DIAGNOSIS — S14103A Unspecified injury at C3 level of cervical spinal cord, initial encounter: Secondary | ICD-10-CM | POA: Diagnosis not present

## 2022-09-10 DIAGNOSIS — S0512XA Contusion of eyeball and orbital tissues, left eye, initial encounter: Secondary | ICD-10-CM | POA: Diagnosis not present

## 2022-09-10 DIAGNOSIS — M25521 Pain in right elbow: Secondary | ICD-10-CM | POA: Diagnosis not present

## 2022-09-10 DIAGNOSIS — I959 Hypotension, unspecified: Secondary | ICD-10-CM | POA: Diagnosis not present

## 2022-09-10 DIAGNOSIS — Z7409 Other reduced mobility: Secondary | ICD-10-CM | POA: Diagnosis not present

## 2022-09-10 DIAGNOSIS — S0003XA Contusion of scalp, initial encounter: Secondary | ICD-10-CM | POA: Diagnosis not present

## 2022-09-10 DIAGNOSIS — M19011 Primary osteoarthritis, right shoulder: Secondary | ICD-10-CM | POA: Diagnosis not present

## 2022-09-10 DIAGNOSIS — S0083XA Contusion of other part of head, initial encounter: Secondary | ICD-10-CM | POA: Diagnosis not present

## 2022-09-10 DIAGNOSIS — E531 Pyridoxine deficiency: Secondary | ICD-10-CM | POA: Diagnosis not present

## 2022-09-10 DIAGNOSIS — I491 Atrial premature depolarization: Secondary | ICD-10-CM | POA: Diagnosis not present

## 2022-09-10 DIAGNOSIS — E119 Type 2 diabetes mellitus without complications: Secondary | ICD-10-CM | POA: Diagnosis not present

## 2022-09-10 DIAGNOSIS — R918 Other nonspecific abnormal finding of lung field: Secondary | ICD-10-CM | POA: Diagnosis not present

## 2022-09-10 DIAGNOSIS — M542 Cervicalgia: Secondary | ICD-10-CM | POA: Diagnosis not present

## 2022-09-10 DIAGNOSIS — D62 Acute posthemorrhagic anemia: Secondary | ICD-10-CM | POA: Diagnosis not present

## 2022-09-10 DIAGNOSIS — F0280 Dementia in other diseases classified elsewhere without behavioral disturbance: Secondary | ICD-10-CM | POA: Diagnosis not present

## 2022-09-10 DIAGNOSIS — R000 Tachycardia, unspecified: Secondary | ICD-10-CM | POA: Diagnosis not present

## 2022-09-10 DIAGNOSIS — E7849 Other hyperlipidemia: Secondary | ICD-10-CM | POA: Diagnosis not present

## 2022-09-10 DIAGNOSIS — F0390 Unspecified dementia without behavioral disturbance: Secondary | ICD-10-CM | POA: Diagnosis not present

## 2022-09-10 DIAGNOSIS — M800AXA Age-related osteoporosis with current pathological fracture, other site, initial encounter for fracture: Secondary | ICD-10-CM | POA: Diagnosis not present

## 2022-09-10 DIAGNOSIS — D638 Anemia in other chronic diseases classified elsewhere: Secondary | ICD-10-CM | POA: Diagnosis not present

## 2022-09-10 DIAGNOSIS — Z743 Need for continuous supervision: Secondary | ICD-10-CM | POA: Diagnosis not present

## 2022-09-10 DIAGNOSIS — R609 Edema, unspecified: Secondary | ICD-10-CM | POA: Diagnosis not present

## 2022-09-10 DIAGNOSIS — M546 Pain in thoracic spine: Secondary | ICD-10-CM | POA: Diagnosis not present

## 2022-09-10 DIAGNOSIS — R079 Chest pain, unspecified: Secondary | ICD-10-CM | POA: Diagnosis not present

## 2022-09-10 DIAGNOSIS — M4802 Spinal stenosis, cervical region: Secondary | ICD-10-CM | POA: Diagnosis not present

## 2022-09-10 DIAGNOSIS — M79601 Pain in right arm: Secondary | ICD-10-CM | POA: Diagnosis not present

## 2022-09-10 DIAGNOSIS — Y998 Other external cause status: Secondary | ICD-10-CM | POA: Diagnosis not present

## 2022-09-10 DIAGNOSIS — G9340 Encephalopathy, unspecified: Secondary | ICD-10-CM | POA: Diagnosis not present

## 2022-09-10 DIAGNOSIS — R634 Abnormal weight loss: Secondary | ICD-10-CM | POA: Diagnosis not present

## 2022-09-10 DIAGNOSIS — Z515 Encounter for palliative care: Secondary | ICD-10-CM | POA: Diagnosis not present

## 2022-09-10 DIAGNOSIS — S14153A Other incomplete lesion at C3 level of cervical spinal cord, initial encounter: Secondary | ICD-10-CM | POA: Diagnosis not present

## 2022-09-10 DIAGNOSIS — S2231XA Fracture of one rib, right side, initial encounter for closed fracture: Secondary | ICD-10-CM | POA: Diagnosis not present

## 2022-09-10 DIAGNOSIS — R29898 Other symptoms and signs involving the musculoskeletal system: Secondary | ICD-10-CM | POA: Diagnosis not present

## 2022-09-10 DIAGNOSIS — H05239 Hemorrhage of unspecified orbit: Secondary | ICD-10-CM | POA: Diagnosis not present

## 2022-09-11 NOTE — Patient Instructions (Incomplete)
Below is our plan:  We will ***  Please make sure you are staying well hydrated. I recommend 50-60 ounces daily. Well balanced diet and regular exercise encouraged. Consistent sleep schedule with 6-8 hours recommended.   Please continue follow up with care team as directed.   Follow up with *** in ***  You may receive a survey regarding today's visit. I encourage you to leave honest feed back as I do use this information to improve patient care. Thank you for seeing me today!   Marland Kitchenallmem

## 2022-09-11 NOTE — Progress Notes (Deleted)
No chief complaint on file.   HISTORY OF PRESENT ILLNESS:  09/11/22 ALL:  [name] [name] is a 86 y.o. female here today for follow up for dementia. She continues donepezil and memantine (started last visit with [name] [name] 05/2022). Since,    HISTORY (copied from Dr Clydene Fake previous note)  HPI: Initial visit 12/17/2020 [name] [name] is a pleasant 86 year old Caucasian lady seen today for initial office consultation visit for confusion and memory loss.  History is obtained from the patient and her husband and daughter were accompanying her as well as review of electronic medical records.  I personally reviewed pertinent available imaging films in PACS.  She has past medical history of diabetes, hypertension, hyperlipidemia, anemia who presented on 12/06/2020 to [facility] long emergency room with sudden onset of confusion described had trouble thinking and using the correct words for articulation.  She also had a blank look at speaking to her husband did not seem to understand them or had trouble finding words.  This lasted about 20 minutes and gradually started improving.  She was found to have urine tract infection for which she is started on antibiotics.  She complained of subsequent headache later as well.  Patient also had significantly elevated blood pressure of 268 systolic on admission.  She gradually improved.  Patient apparently and had a somewhat similar episode when she had a previous bout of urinary tract infection as well.  On inquiry the patient's husband states that she has noticed that she is having mild short-term memory difficulties for the last few years.  She is requiring more help with activities of daily living like dressing herself.  She remains a fall risk because of shoulder fracture as well as the left peripheral neuropathy.  She has recently started home physical outpatient speech therapy following recent hospital discharge.  She does have a strong family history of dementia in her sister as  well as her mother.  She denies any hallucinations, delusions, unsafe behavior or agitation.  She did have MRI scan of the brain done on 12/06/2020 which showed mild generalized atrophy and changes of small vessel disease no acute abnormality.  CT angiogram of the brain and neck, showed moderate left paraclinoid carotid stenosis as well as multifocal stenosis of the nondominant right vertebral artery.  Echocardiogram showed normal ejection fraction of 60 to 65%.  Carotid ultrasound was unremarkable.  LDL cholesterol 39 mg percent and hemoglobin A1c of 7.4.  She did not have an EEG done.  Lab work for reversible causes of memory loss was also not done.  Update 02/14/2021 : She returns for follow-up after last visit 2 months ago.  She is accompanied by her son and her husband provide history.  Patient continues to have short-term memory difficulties particularly remembering recent information.  Her past memory seems fine.  She is quite independent at home.  She is supposed to use a cane but can walk independently without it indoors and uses a walker for outdoors.  She has had no falls or injuries.  She does get frustrated when she cannot remember things but there is no agitation, she has no delusions hallucinations.  She needs some help with the shower but otherwise can do most of her basic needs by herself.  She had lab work done at last visit which showed elevated TSH of 6.86 but vitamin B12, homocystine and RPR were normal.  She was asked to see primary care physician who has done some additional thyroid labs and she  has an appointment in fact today it to discuss treatment with him.  She had EEG done on 01/29/2021 which was normal.  Patient has been feels that her memory difficulties are unchanged.  She has no new complaints.  Update 05/21/2021 : She returns for follow-up after last visit 3 months ago.  She is accompanied by her husband and daughter.  She continues to have short-term memory difficulties but these  family feels unchanged.  She has some good days and bad days.  She still is mostly independent in activities of daily living and does not need much supervision.  There have been no delusions, hallucinations or unsafe behaviors noted.  She is pretty calm and does not get agitated and there has been no violent behavior noted.  On Mini-Mental status exam today she scored 26/30 which is actually declined from 28/30 and March 2022.  Update 06/05/2022 : She returns for follow-up after last visit a year ago.  She missed her follow-up appointment in May as she was sick.  She is accompanied by her daughter and husband.  They feel that she is doing about the same.  She was started on Aricept by me at last visit and she is tolerating it well without side effects.  She is still living at home with her husband.  She continues to have short-term memory difficulties but is able to ambulate by herself she uses a walker.  She has had a few falls.  She is currently undergoing outpatient physical therapy which seems to have helped with her gait and balance.  She uses a walker if she remembers and needs constant reminders for the same.  She does not go out a lot and socialize and occasionally complains she is feeling depressed.  She denies any delusions, hallucinations, agitation or violent behavior.  On Mini-Mental status exam testing today she scored 20/30 which actually is a decline from 26/30 at last visit.   REVIEW OF SYSTEMS: Out of a complete 14 system review of symptoms, the patient complains only of the following symptoms, and all other reviewed systems are negative.   ALLERGIES: No Known Allergies   HOME MEDICATIONS: Outpatient Medications Prior to Visit  Medication Sig Dispense Refill   ACCU-CHEK GUIDE test strip USE AS DIRECTED THREE TIMES DAILY 150 strip 3   amoxicillin-clavulanate (AUGMENTIN) 500-125 MG tablet Take 1 tablet (500 mg total) by mouth 2 (two) times daily. 14 tablet 0   atorvastatin (LIPITOR) 40  MG tablet Take 20 mg by mouth daily.      Blood Glucose Monitoring Suppl (ACCU-CHEK GUIDE) w/Device KIT Use to check blood sugar  times a day 1 kit 0   Calcium Citrate-Vitamin D 200-250 MG-UNIT TABS Take 1 tablet by mouth daily.     colestipol (COLESTID) 1 g tablet Take 1 tablet (1 g total) by mouth 2 (two) times daily. 180 tablet 3   COMFORT EZ PEN NEEDLES 31G X 5 MM MISC      Continuous Blood Gluc Receiver (DEXCOM G7 RECEIVER) DEVI To display CGM data 1 each 0   Continuous Blood Gluc Sensor (DEXCOM G7 SENSOR) MISC 1 Device by Does not apply route as directed. Change sensor every 10 days 3 each 3   Cyanocobalamin (B-12) 1000 MCG TABS Take 1,000 mcg by mouth daily.      diclofenac sodium (VOLTAREN) 1 % GEL Apply 2 g topically at bedtime.     donepezil (ARICEPT) 10 MG tablet Take 1 tablet (10 mg total) by mouth at bedtime.  Please call and make overdue appt for further refills. 1st attempt 30 tablet 0   DULoxetine (CYMBALTA) 60 MG capsule Take 60 mg by mouth daily.     enalapril (VASOTEC) 20 MG tablet Take 10 mg by mouth 2 (two) times daily.     ferrous sulfate 325 (65 FE) MG EC tablet Take 325 mg by mouth daily with breakfast.     hydrochlorothiazide (HYDRODIURIL) 12.5 MG tablet Take 12.5 mg by mouth daily.     labetalol (NORMODYNE) 200 MG tablet Take 300 mg by mouth 2 (two) times daily.     Lactobacillus Rhamnosus, GG, (RA PROBIOTIC DIGESTIVE CARE PO) Take 1 capsule by mouth daily.     levothyroxine (SYNTHROID) 25 MCG tablet Take 25 mcg by mouth daily before breakfast.     memantine (NAMENDA TITRATION PAK) tablet pack 5 mg/day for =1 week; 5 mg twice daily for =1 week; 15 mg/day given in 5 mg and 10 mg separated doses for =1 week; then 10 mg twice daily 49 tablet 12   memantine (NAMENDA) 10 MG tablet Take 1 tablet (10 mg total) by mouth 2 (two) times daily. 60 tablet 3   Multiple Vitamins-Minerals (CENTRUM) tablet Take 1 tablet by mouth daily.     nitrofurantoin (MACRODANTIN) 50 MG capsule Take 1  capsule (50 mg total) by mouth at bedtime. 30 capsule 11   sertraline (ZOLOFT) 25 MG tablet Take 25 mg by mouth daily.     SOLIQUA 100-33 UNT-MCG/ML SOPN INJECT 22 UNITS SQ DAILY BEFORE BREAKFast (Patient taking differently: Inject 18 Units into the skin daily.) 15 mL 1   Vibegron (GEMTESA) 75 MG TABS Take 1 capsule by mouth daily. 30 tablet 11   No facility-administered medications prior to visit.     PAST MEDICAL HISTORY: Past Medical History:  Diagnosis Date   Anemia    Anxiety    Arthritis    Chronic back pain    Chronic diarrhea    Diabetes ([place])    Diverticulosis    GERD (gastroesophageal reflux disease)    Hyperlipidemia    Hypertension    Internal hemorrhoids    UTI (urinary tract infection)      PAST SURGICAL HISTORY: Past Surgical History:  Procedure Laterality Date   ABDOMINAL HYSTERECTOMY     CARDIAC CATHETERIZATION     CATARACT EXTRACTION, BILATERAL     CHOLECYSTECTOMY     COLONOSCOPY  11/2015   Dr. Britta Mccreedy: hyperplastic polyp from sigmoid colon. biopsies from ascending colon were negative for microscopic colitis.    COLONOSCOPY N/A 03/31/2018   Dr. Gala Romney: Diverticulosis, random colon biopsies negative, hemorrhoids.  No future screening or surveillance colonoscopy is recommended.   LAPAROTOMY N/A 08/08/2021   Procedure: EXPLORATORY LAPAROTOMY,  OVERSEW PERFORATED SMALL BOWEL;  Surgeon: Virl Cagey, MD;  Location: [facility];  Service: General;  Laterality: N/A;   PARTIAL HIP ARTHROPLASTY Right    TONSILLECTOMY     TRIGGER FINGER RELEASE       FAMILY HISTORY: Family History  Problem Relation Age of Onset   Heart attack Sister    Transient ischemic attack Sister    Heart attack Mother    Diabetes Mother    Transient ischemic attack Mother    Heart attack Father    Diabetes Son    Diabetes Maternal Aunt    Rectal cancer Other        anal CA   Colon cancer Neg Hx    Celiac disease Neg Hx    Inflammatory  bowel disease Neg Hx    Stomach cancer  Neg Hx      SOCIAL HISTORY: Social History   Socioeconomic History   Marital status: Married    Spouse name: Lobbyist   Number of children: 3   Years of education: Not on file   Highest education level: Not on file  Occupational History   Not on file  Tobacco Use   Smoking status: Never   Smokeless tobacco: Never  Vaping Use   Vaping Use: Never used  Substance and Sexual Activity   Alcohol use: No    Alcohol/week: 0.0 standard drinks of alcohol   Drug use: No   Sexual activity: Not on file  Other Topics Concern   Not on file  Social History Narrative   Lives at home with husband    Right Handed   Drinks 1-2 cups caffeine daily   Social Determinants of Health   Financial Resource Strain: Not on file  Food Insecurity: Not on file  Transportation Needs: Not on file  Physical Activity: Not on file  Stress: Not on file  Social Connections: Not on file  Intimate Partner Violence: Not on file     PHYSICAL EXAM  There were no vitals filed for this visit. There is no height or weight on file to calculate BMI.  Generalized: Well developed, in no acute distress  Cardiology: normal rate and rhythm, no murmur auscultated  Respiratory: clear to auscultation bilaterally    Neurological examination  Mentation: Alert oriented to time, place, history taking. Follows all commands speech and language fluent Cranial nerve II-XII: Pupils were equal round reactive to light. Extraocular movements were full, visual field were full on confrontational test. Facial sensation and strength were normal. Uvula tongue midline. Head turning and shoulder shrug  were normal and symmetric. Motor: The motor testing reveals 5 over 5 strength of all 4 extremities. Good symmetric motor tone is noted throughout.  Sensory: Sensory testing is intact to soft touch on all 4 extremities. No evidence of extinction is noted.  Coordination: Cerebellar testing reveals good finger-nose-finger and heel-to-shin  bilaterally.  Gait and station: Gait is normal. Tandem gait is normal. Romberg is negative. No drift is seen.  Reflexes: Deep tendon reflexes are symmetric and normal bilaterally.    DIAGNOSTIC DATA (LABS, IMAGING, TESTING) - I reviewed patient records, labs, notes, testing and imaging myself where available.  Lab Results  Component Value Date   WBC 7.3 08/12/2021   HGB 7.4 (L) 08/12/2021   HCT 23.9 (L) 08/12/2021   MCV 91.6 08/12/2021   PLT 159 08/12/2021      Component Value Date/Time   NA 140 06/26/2022 1501   K 4.8 06/26/2022 1501   CL 102 06/26/2022 1501   CO2 30 06/26/2022 1501   GLUCOSE 73 06/26/2022 1501   BUN 36 (H) 06/26/2022 1501   CREATININE 1.48 (H) 06/26/2022 1501   CREATININE 1.07 (H) 12/02/2017 1211   CALCIUM 10.3 06/26/2022 1501   PROT 7.0 06/26/2022 1501   ALBUMIN 4.2 06/26/2022 1501   AST 15 06/26/2022 1501   ALT 13 06/26/2022 1501   ALKPHOS 61 06/26/2022 1501   BILITOT 0.5 06/26/2022 1501   GFRNONAA 46 (L) 08/13/2021 0526   GFRAA >60 10/16/2017 0730   Lab Results  Component Value Date   CHOL 114 12/07/2020   HDL 49 12/07/2020   LDLCALC 39 12/07/2020   TRIG 129 12/07/2020   CHOLHDL 2.3 12/07/2020   Lab Results  Component Value Date  HGBA1C 6.9 (A) 06/26/2022   Lab Results  Component Value Date   VITAMINB12 620 02/11/2021   Lab Results  Component Value Date   TSH 1.17 03/07/2021       06/05/2022    2:50 PM 05/21/2021    1:43 PM 12/17/2020   10:53 AM  MMSE - Mini Mental State Exam  Orientation to time _0 Orientation to Place _1 Registration _2 Attention/ Calculation 0 5 5  Recall 1 0 2  Language- name 2 objects _3 Language- repeat 0 1 0  Language- follow 3 step command _4 Language- read & follow direction _5 Write a sentence _6 Copy design 0 0 1  Copy design-comments  4 legged animals x 1 minute: 3   Total score _7 No data to display           ASSESSMENT AND PLAN  86 y.o.  year old female  has a past medical history of Anemia, Anxiety, Arthritis, Chronic back pain, Chronic diarrhea, Diabetes ([place]), Diverticulosis, GERD (gastroesophageal reflux disease), Hyperlipidemia, Hypertension, Internal hemorrhoids, and UTI (urinary tract infection). here with    No diagnosis found.  Gerilyn Nestle ***.  Healthy lifestyle habits encouraged. *** will follow up with PCP as directed. *** will return to see me in ***, sooner if needed. *** verbalizes understanding and agreement with this plan.   No orders of the defined types were placed in this encounter.    No orders of the defined types were placed in this encounter.    Debbora Presto, MSN, FNP-C 09/11/2022, 2:01 PM  [facility] [address] [place], [place] [address] [phone]

## 2022-09-16 ENCOUNTER — Ambulatory Visit: Payer: Medicare PPO | Admitting: Family Medicine

## 2022-09-16 DIAGNOSIS — F0280 Dementia in other diseases classified elsewhere without behavioral disturbance: Secondary | ICD-10-CM

## 2022-10-07 DIAGNOSIS — S065X0D Traumatic subdural hemorrhage without loss of consciousness, subsequent encounter: Secondary | ICD-10-CM | POA: Diagnosis not present

## 2022-10-07 DIAGNOSIS — M4802 Spinal stenosis, cervical region: Secondary | ICD-10-CM | POA: Diagnosis not present

## 2022-10-07 DIAGNOSIS — I10 Essential (primary) hypertension: Secondary | ICD-10-CM | POA: Diagnosis not present

## 2022-10-07 DIAGNOSIS — E1122 Type 2 diabetes mellitus with diabetic chronic kidney disease: Secondary | ICD-10-CM | POA: Diagnosis not present

## 2022-10-08 DIAGNOSIS — R2689 Other abnormalities of gait and mobility: Secondary | ICD-10-CM | POA: Diagnosis not present

## 2022-10-08 DIAGNOSIS — R1310 Dysphagia, unspecified: Secondary | ICD-10-CM | POA: Diagnosis not present

## 2022-10-08 DIAGNOSIS — Z48811 Encounter for surgical aftercare following surgery on the nervous system: Secondary | ICD-10-CM | POA: Diagnosis not present

## 2022-10-08 DIAGNOSIS — R41841 Cognitive communication deficit: Secondary | ICD-10-CM | POA: Diagnosis not present

## 2022-10-08 DIAGNOSIS — M6281 Muscle weakness (generalized): Secondary | ICD-10-CM | POA: Diagnosis not present

## 2022-10-09 DIAGNOSIS — R1310 Dysphagia, unspecified: Secondary | ICD-10-CM | POA: Diagnosis not present

## 2022-10-09 DIAGNOSIS — R2689 Other abnormalities of gait and mobility: Secondary | ICD-10-CM | POA: Diagnosis not present

## 2022-10-09 DIAGNOSIS — M6281 Muscle weakness (generalized): Secondary | ICD-10-CM | POA: Diagnosis not present

## 2022-10-09 DIAGNOSIS — Z48811 Encounter for surgical aftercare following surgery on the nervous system: Secondary | ICD-10-CM | POA: Diagnosis not present

## 2022-10-09 DIAGNOSIS — R41841 Cognitive communication deficit: Secondary | ICD-10-CM | POA: Diagnosis not present

## 2022-10-10 DIAGNOSIS — R1310 Dysphagia, unspecified: Secondary | ICD-10-CM | POA: Diagnosis not present

## 2022-10-10 DIAGNOSIS — R2689 Other abnormalities of gait and mobility: Secondary | ICD-10-CM | POA: Diagnosis not present

## 2022-10-10 DIAGNOSIS — M6281 Muscle weakness (generalized): Secondary | ICD-10-CM | POA: Diagnosis not present

## 2022-10-10 DIAGNOSIS — Z48811 Encounter for surgical aftercare following surgery on the nervous system: Secondary | ICD-10-CM | POA: Diagnosis not present

## 2022-10-10 DIAGNOSIS — R41841 Cognitive communication deficit: Secondary | ICD-10-CM | POA: Diagnosis not present

## 2022-10-13 DIAGNOSIS — Z48811 Encounter for surgical aftercare following surgery on the nervous system: Secondary | ICD-10-CM | POA: Diagnosis not present

## 2022-10-13 DIAGNOSIS — R41841 Cognitive communication deficit: Secondary | ICD-10-CM | POA: Diagnosis not present

## 2022-10-13 DIAGNOSIS — R2689 Other abnormalities of gait and mobility: Secondary | ICD-10-CM | POA: Diagnosis not present

## 2022-10-13 DIAGNOSIS — R1310 Dysphagia, unspecified: Secondary | ICD-10-CM | POA: Diagnosis not present

## 2022-10-13 DIAGNOSIS — M6281 Muscle weakness (generalized): Secondary | ICD-10-CM | POA: Diagnosis not present

## 2022-10-14 DIAGNOSIS — M6281 Muscle weakness (generalized): Secondary | ICD-10-CM | POA: Diagnosis not present

## 2022-10-14 DIAGNOSIS — R41841 Cognitive communication deficit: Secondary | ICD-10-CM | POA: Diagnosis not present

## 2022-10-14 DIAGNOSIS — Z48811 Encounter for surgical aftercare following surgery on the nervous system: Secondary | ICD-10-CM | POA: Diagnosis not present

## 2022-10-14 DIAGNOSIS — R2689 Other abnormalities of gait and mobility: Secondary | ICD-10-CM | POA: Diagnosis not present

## 2022-10-14 DIAGNOSIS — R1310 Dysphagia, unspecified: Secondary | ICD-10-CM | POA: Diagnosis not present

## 2022-10-15 DIAGNOSIS — Z48811 Encounter for surgical aftercare following surgery on the nervous system: Secondary | ICD-10-CM | POA: Diagnosis not present

## 2022-10-15 DIAGNOSIS — R2689 Other abnormalities of gait and mobility: Secondary | ICD-10-CM | POA: Diagnosis not present

## 2022-10-15 DIAGNOSIS — R1310 Dysphagia, unspecified: Secondary | ICD-10-CM | POA: Diagnosis not present

## 2022-10-15 DIAGNOSIS — R41841 Cognitive communication deficit: Secondary | ICD-10-CM | POA: Diagnosis not present

## 2022-10-15 DIAGNOSIS — M6281 Muscle weakness (generalized): Secondary | ICD-10-CM | POA: Diagnosis not present

## 2022-10-16 DIAGNOSIS — R1310 Dysphagia, unspecified: Secondary | ICD-10-CM | POA: Diagnosis not present

## 2022-10-16 DIAGNOSIS — Z48811 Encounter for surgical aftercare following surgery on the nervous system: Secondary | ICD-10-CM | POA: Diagnosis not present

## 2022-10-16 DIAGNOSIS — R2689 Other abnormalities of gait and mobility: Secondary | ICD-10-CM | POA: Diagnosis not present

## 2022-10-16 DIAGNOSIS — R41841 Cognitive communication deficit: Secondary | ICD-10-CM | POA: Diagnosis not present

## 2022-10-16 DIAGNOSIS — M6281 Muscle weakness (generalized): Secondary | ICD-10-CM | POA: Diagnosis not present

## 2022-10-17 DIAGNOSIS — R2689 Other abnormalities of gait and mobility: Secondary | ICD-10-CM | POA: Diagnosis not present

## 2022-10-17 DIAGNOSIS — R1310 Dysphagia, unspecified: Secondary | ICD-10-CM | POA: Diagnosis not present

## 2022-10-17 DIAGNOSIS — Z48811 Encounter for surgical aftercare following surgery on the nervous system: Secondary | ICD-10-CM | POA: Diagnosis not present

## 2022-10-17 DIAGNOSIS — M6281 Muscle weakness (generalized): Secondary | ICD-10-CM | POA: Diagnosis not present

## 2022-10-17 DIAGNOSIS — R41841 Cognitive communication deficit: Secondary | ICD-10-CM | POA: Diagnosis not present

## 2022-10-20 DIAGNOSIS — R41841 Cognitive communication deficit: Secondary | ICD-10-CM | POA: Diagnosis not present

## 2022-10-20 DIAGNOSIS — M6281 Muscle weakness (generalized): Secondary | ICD-10-CM | POA: Diagnosis not present

## 2022-10-20 DIAGNOSIS — R1310 Dysphagia, unspecified: Secondary | ICD-10-CM | POA: Diagnosis not present

## 2022-10-20 DIAGNOSIS — R2689 Other abnormalities of gait and mobility: Secondary | ICD-10-CM | POA: Diagnosis not present

## 2022-10-20 DIAGNOSIS — Z48811 Encounter for surgical aftercare following surgery on the nervous system: Secondary | ICD-10-CM | POA: Diagnosis not present

## 2022-10-21 DIAGNOSIS — R1310 Dysphagia, unspecified: Secondary | ICD-10-CM | POA: Diagnosis not present

## 2022-10-21 DIAGNOSIS — R2689 Other abnormalities of gait and mobility: Secondary | ICD-10-CM | POA: Diagnosis not present

## 2022-10-21 DIAGNOSIS — R41841 Cognitive communication deficit: Secondary | ICD-10-CM | POA: Diagnosis not present

## 2022-10-21 DIAGNOSIS — Z48811 Encounter for surgical aftercare following surgery on the nervous system: Secondary | ICD-10-CM | POA: Diagnosis not present

## 2022-10-21 DIAGNOSIS — M6281 Muscle weakness (generalized): Secondary | ICD-10-CM | POA: Diagnosis not present

## 2022-10-22 DIAGNOSIS — Z48811 Encounter for surgical aftercare following surgery on the nervous system: Secondary | ICD-10-CM | POA: Diagnosis not present

## 2022-10-22 DIAGNOSIS — R2689 Other abnormalities of gait and mobility: Secondary | ICD-10-CM | POA: Diagnosis not present

## 2022-10-22 DIAGNOSIS — R1310 Dysphagia, unspecified: Secondary | ICD-10-CM | POA: Diagnosis not present

## 2022-10-22 DIAGNOSIS — M6281 Muscle weakness (generalized): Secondary | ICD-10-CM | POA: Diagnosis not present

## 2022-10-22 DIAGNOSIS — R41841 Cognitive communication deficit: Secondary | ICD-10-CM | POA: Diagnosis not present

## 2022-10-23 DIAGNOSIS — R2689 Other abnormalities of gait and mobility: Secondary | ICD-10-CM | POA: Diagnosis not present

## 2022-10-23 DIAGNOSIS — Z48811 Encounter for surgical aftercare following surgery on the nervous system: Secondary | ICD-10-CM | POA: Diagnosis not present

## 2022-10-23 DIAGNOSIS — R41841 Cognitive communication deficit: Secondary | ICD-10-CM | POA: Diagnosis not present

## 2022-10-23 DIAGNOSIS — M6281 Muscle weakness (generalized): Secondary | ICD-10-CM | POA: Diagnosis not present

## 2022-10-23 DIAGNOSIS — R1310 Dysphagia, unspecified: Secondary | ICD-10-CM | POA: Diagnosis not present

## 2022-10-24 DIAGNOSIS — R2689 Other abnormalities of gait and mobility: Secondary | ICD-10-CM | POA: Diagnosis not present

## 2022-10-24 DIAGNOSIS — Z48811 Encounter for surgical aftercare following surgery on the nervous system: Secondary | ICD-10-CM | POA: Diagnosis not present

## 2022-10-24 DIAGNOSIS — I10 Essential (primary) hypertension: Secondary | ICD-10-CM | POA: Diagnosis not present

## 2022-10-24 DIAGNOSIS — R1310 Dysphagia, unspecified: Secondary | ICD-10-CM | POA: Diagnosis not present

## 2022-10-24 DIAGNOSIS — M6281 Muscle weakness (generalized): Secondary | ICD-10-CM | POA: Diagnosis not present

## 2022-10-24 DIAGNOSIS — E1122 Type 2 diabetes mellitus with diabetic chronic kidney disease: Secondary | ICD-10-CM | POA: Diagnosis not present

## 2022-10-24 DIAGNOSIS — S065XAD Traumatic subdural hemorrhage with loss of consciousness status unknown, subsequent encounter: Secondary | ICD-10-CM | POA: Diagnosis not present

## 2022-10-24 DIAGNOSIS — R41841 Cognitive communication deficit: Secondary | ICD-10-CM | POA: Diagnosis not present

## 2022-10-24 DIAGNOSIS — M4802 Spinal stenosis, cervical region: Secondary | ICD-10-CM | POA: Diagnosis not present

## 2022-10-27 ENCOUNTER — Ambulatory Visit: Payer: Medicare PPO | Admitting: Endocrinology

## 2022-10-27 DIAGNOSIS — M6281 Muscle weakness (generalized): Secondary | ICD-10-CM | POA: Diagnosis not present

## 2022-10-27 DIAGNOSIS — R41841 Cognitive communication deficit: Secondary | ICD-10-CM | POA: Diagnosis not present

## 2022-10-27 DIAGNOSIS — Z48811 Encounter for surgical aftercare following surgery on the nervous system: Secondary | ICD-10-CM | POA: Diagnosis not present

## 2022-10-27 DIAGNOSIS — R1310 Dysphagia, unspecified: Secondary | ICD-10-CM | POA: Diagnosis not present

## 2022-10-27 DIAGNOSIS — R2689 Other abnormalities of gait and mobility: Secondary | ICD-10-CM | POA: Diagnosis not present

## 2022-10-28 DIAGNOSIS — R41841 Cognitive communication deficit: Secondary | ICD-10-CM | POA: Diagnosis not present

## 2022-10-28 DIAGNOSIS — M6281 Muscle weakness (generalized): Secondary | ICD-10-CM | POA: Diagnosis not present

## 2022-10-28 DIAGNOSIS — R2689 Other abnormalities of gait and mobility: Secondary | ICD-10-CM | POA: Diagnosis not present

## 2022-10-28 DIAGNOSIS — Z48811 Encounter for surgical aftercare following surgery on the nervous system: Secondary | ICD-10-CM | POA: Diagnosis not present

## 2022-10-28 DIAGNOSIS — R1310 Dysphagia, unspecified: Secondary | ICD-10-CM | POA: Diagnosis not present

## 2022-10-29 DIAGNOSIS — R531 Weakness: Secondary | ICD-10-CM | POA: Diagnosis not present

## 2022-10-29 DIAGNOSIS — I6200 Nontraumatic subdural hemorrhage, unspecified: Secondary | ICD-10-CM | POA: Diagnosis not present

## 2022-10-29 DIAGNOSIS — Z9181 History of falling: Secondary | ICD-10-CM | POA: Diagnosis not present

## 2022-10-29 DIAGNOSIS — Z48811 Encounter for surgical aftercare following surgery on the nervous system: Secondary | ICD-10-CM | POA: Diagnosis not present

## 2022-10-29 DIAGNOSIS — R41841 Cognitive communication deficit: Secondary | ICD-10-CM | POA: Diagnosis not present

## 2022-10-29 DIAGNOSIS — S065XAA Traumatic subdural hemorrhage with loss of consciousness status unknown, initial encounter: Secondary | ICD-10-CM | POA: Diagnosis not present

## 2022-10-29 DIAGNOSIS — Z8679 Personal history of other diseases of the circulatory system: Secondary | ICD-10-CM | POA: Diagnosis not present

## 2022-10-29 DIAGNOSIS — M6281 Muscle weakness (generalized): Secondary | ICD-10-CM | POA: Diagnosis not present

## 2022-10-29 DIAGNOSIS — R2689 Other abnormalities of gait and mobility: Secondary | ICD-10-CM | POA: Diagnosis not present

## 2022-10-29 DIAGNOSIS — R1310 Dysphagia, unspecified: Secondary | ICD-10-CM | POA: Diagnosis not present

## 2022-10-30 DIAGNOSIS — R1310 Dysphagia, unspecified: Secondary | ICD-10-CM | POA: Diagnosis not present

## 2022-10-30 DIAGNOSIS — R2689 Other abnormalities of gait and mobility: Secondary | ICD-10-CM | POA: Diagnosis not present

## 2022-10-30 DIAGNOSIS — R41841 Cognitive communication deficit: Secondary | ICD-10-CM | POA: Diagnosis not present

## 2022-10-30 DIAGNOSIS — Z48811 Encounter for surgical aftercare following surgery on the nervous system: Secondary | ICD-10-CM | POA: Diagnosis not present

## 2022-10-30 DIAGNOSIS — M6281 Muscle weakness (generalized): Secondary | ICD-10-CM | POA: Diagnosis not present

## 2022-10-31 DIAGNOSIS — R2689 Other abnormalities of gait and mobility: Secondary | ICD-10-CM | POA: Diagnosis not present

## 2022-10-31 DIAGNOSIS — Z48811 Encounter for surgical aftercare following surgery on the nervous system: Secondary | ICD-10-CM | POA: Diagnosis not present

## 2022-10-31 DIAGNOSIS — R1310 Dysphagia, unspecified: Secondary | ICD-10-CM | POA: Diagnosis not present

## 2022-10-31 DIAGNOSIS — M6281 Muscle weakness (generalized): Secondary | ICD-10-CM | POA: Diagnosis not present

## 2022-10-31 DIAGNOSIS — R41841 Cognitive communication deficit: Secondary | ICD-10-CM | POA: Diagnosis not present

## 2022-11-03 DIAGNOSIS — M6281 Muscle weakness (generalized): Secondary | ICD-10-CM | POA: Diagnosis not present

## 2022-11-03 DIAGNOSIS — R2689 Other abnormalities of gait and mobility: Secondary | ICD-10-CM | POA: Diagnosis not present

## 2022-11-03 DIAGNOSIS — R1310 Dysphagia, unspecified: Secondary | ICD-10-CM | POA: Diagnosis not present

## 2022-11-03 DIAGNOSIS — R41841 Cognitive communication deficit: Secondary | ICD-10-CM | POA: Diagnosis not present

## 2022-11-03 DIAGNOSIS — Z48811 Encounter for surgical aftercare following surgery on the nervous system: Secondary | ICD-10-CM | POA: Diagnosis not present

## 2022-11-04 DIAGNOSIS — R2689 Other abnormalities of gait and mobility: Secondary | ICD-10-CM | POA: Diagnosis not present

## 2022-11-04 DIAGNOSIS — R1310 Dysphagia, unspecified: Secondary | ICD-10-CM | POA: Diagnosis not present

## 2022-11-04 DIAGNOSIS — M6281 Muscle weakness (generalized): Secondary | ICD-10-CM | POA: Diagnosis not present

## 2022-11-04 DIAGNOSIS — R41841 Cognitive communication deficit: Secondary | ICD-10-CM | POA: Diagnosis not present

## 2022-11-04 DIAGNOSIS — Z48811 Encounter for surgical aftercare following surgery on the nervous system: Secondary | ICD-10-CM | POA: Diagnosis not present

## 2022-11-05 DIAGNOSIS — Z9889 Other specified postprocedural states: Secondary | ICD-10-CM | POA: Diagnosis not present

## 2022-11-05 DIAGNOSIS — M50322 Other cervical disc degeneration at C5-C6 level: Secondary | ICD-10-CM | POA: Diagnosis not present

## 2022-11-05 DIAGNOSIS — R1310 Dysphagia, unspecified: Secondary | ICD-10-CM | POA: Diagnosis not present

## 2022-11-05 DIAGNOSIS — R41841 Cognitive communication deficit: Secondary | ICD-10-CM | POA: Diagnosis not present

## 2022-11-05 DIAGNOSIS — Z48811 Encounter for surgical aftercare following surgery on the nervous system: Secondary | ICD-10-CM | POA: Diagnosis not present

## 2022-11-05 DIAGNOSIS — M50321 Other cervical disc degeneration at C4-C5 level: Secondary | ICD-10-CM | POA: Diagnosis not present

## 2022-11-05 DIAGNOSIS — M4322 Fusion of spine, cervical region: Secondary | ICD-10-CM | POA: Diagnosis not present

## 2022-11-05 DIAGNOSIS — M6281 Muscle weakness (generalized): Secondary | ICD-10-CM | POA: Diagnosis not present

## 2022-11-05 DIAGNOSIS — R2689 Other abnormalities of gait and mobility: Secondary | ICD-10-CM | POA: Diagnosis not present

## 2022-11-06 DIAGNOSIS — R1310 Dysphagia, unspecified: Secondary | ICD-10-CM | POA: Diagnosis not present

## 2022-11-06 DIAGNOSIS — M6281 Muscle weakness (generalized): Secondary | ICD-10-CM | POA: Diagnosis not present

## 2022-11-06 DIAGNOSIS — R41841 Cognitive communication deficit: Secondary | ICD-10-CM | POA: Diagnosis not present

## 2022-11-06 DIAGNOSIS — R2689 Other abnormalities of gait and mobility: Secondary | ICD-10-CM | POA: Diagnosis not present

## 2022-11-06 DIAGNOSIS — Z48811 Encounter for surgical aftercare following surgery on the nervous system: Secondary | ICD-10-CM | POA: Diagnosis not present

## 2022-11-07 DIAGNOSIS — R41841 Cognitive communication deficit: Secondary | ICD-10-CM | POA: Diagnosis not present

## 2022-11-07 DIAGNOSIS — M6281 Muscle weakness (generalized): Secondary | ICD-10-CM | POA: Diagnosis not present

## 2022-11-07 DIAGNOSIS — R2689 Other abnormalities of gait and mobility: Secondary | ICD-10-CM | POA: Diagnosis not present

## 2022-11-07 DIAGNOSIS — Z48811 Encounter for surgical aftercare following surgery on the nervous system: Secondary | ICD-10-CM | POA: Diagnosis not present

## 2022-11-07 DIAGNOSIS — R1310 Dysphagia, unspecified: Secondary | ICD-10-CM | POA: Diagnosis not present

## 2022-11-10 DIAGNOSIS — R262 Difficulty in walking, not elsewhere classified: Secondary | ICD-10-CM | POA: Diagnosis not present

## 2022-11-10 DIAGNOSIS — I739 Peripheral vascular disease, unspecified: Secondary | ICD-10-CM | POA: Diagnosis not present

## 2022-11-10 DIAGNOSIS — M6281 Muscle weakness (generalized): Secondary | ICD-10-CM | POA: Diagnosis not present

## 2022-11-10 DIAGNOSIS — B351 Tinea unguium: Secondary | ICD-10-CM | POA: Diagnosis not present

## 2022-11-10 DIAGNOSIS — R41841 Cognitive communication deficit: Secondary | ICD-10-CM | POA: Diagnosis not present

## 2022-11-10 DIAGNOSIS — R1310 Dysphagia, unspecified: Secondary | ICD-10-CM | POA: Diagnosis not present

## 2022-11-10 DIAGNOSIS — M2041 Other hammer toe(s) (acquired), right foot: Secondary | ICD-10-CM | POA: Diagnosis not present

## 2022-11-10 DIAGNOSIS — Z48811 Encounter for surgical aftercare following surgery on the nervous system: Secondary | ICD-10-CM | POA: Diagnosis not present

## 2022-11-10 DIAGNOSIS — R2689 Other abnormalities of gait and mobility: Secondary | ICD-10-CM | POA: Diagnosis not present

## 2022-11-11 DIAGNOSIS — R2689 Other abnormalities of gait and mobility: Secondary | ICD-10-CM | POA: Diagnosis not present

## 2022-11-11 DIAGNOSIS — M6281 Muscle weakness (generalized): Secondary | ICD-10-CM | POA: Diagnosis not present

## 2022-11-11 DIAGNOSIS — R1310 Dysphagia, unspecified: Secondary | ICD-10-CM | POA: Diagnosis not present

## 2022-11-11 DIAGNOSIS — R41841 Cognitive communication deficit: Secondary | ICD-10-CM | POA: Diagnosis not present

## 2022-11-11 DIAGNOSIS — Z48811 Encounter for surgical aftercare following surgery on the nervous system: Secondary | ICD-10-CM | POA: Diagnosis not present

## 2022-11-12 ENCOUNTER — Ambulatory Visit: Payer: Medicare PPO | Admitting: Urology

## 2022-11-12 DIAGNOSIS — R41841 Cognitive communication deficit: Secondary | ICD-10-CM | POA: Diagnosis not present

## 2022-11-12 DIAGNOSIS — M6281 Muscle weakness (generalized): Secondary | ICD-10-CM | POA: Diagnosis not present

## 2022-11-12 DIAGNOSIS — R1310 Dysphagia, unspecified: Secondary | ICD-10-CM | POA: Diagnosis not present

## 2022-11-12 DIAGNOSIS — R2689 Other abnormalities of gait and mobility: Secondary | ICD-10-CM | POA: Diagnosis not present

## 2022-11-12 DIAGNOSIS — Z48811 Encounter for surgical aftercare following surgery on the nervous system: Secondary | ICD-10-CM | POA: Diagnosis not present

## 2022-11-13 DIAGNOSIS — R1310 Dysphagia, unspecified: Secondary | ICD-10-CM | POA: Diagnosis not present

## 2022-11-13 DIAGNOSIS — R2689 Other abnormalities of gait and mobility: Secondary | ICD-10-CM | POA: Diagnosis not present

## 2022-11-13 DIAGNOSIS — Z48811 Encounter for surgical aftercare following surgery on the nervous system: Secondary | ICD-10-CM | POA: Diagnosis not present

## 2022-11-13 DIAGNOSIS — M6281 Muscle weakness (generalized): Secondary | ICD-10-CM | POA: Diagnosis not present

## 2022-11-13 DIAGNOSIS — R41841 Cognitive communication deficit: Secondary | ICD-10-CM | POA: Diagnosis not present

## 2022-11-14 DIAGNOSIS — R1310 Dysphagia, unspecified: Secondary | ICD-10-CM | POA: Diagnosis not present

## 2022-11-14 DIAGNOSIS — R2689 Other abnormalities of gait and mobility: Secondary | ICD-10-CM | POA: Diagnosis not present

## 2022-11-14 DIAGNOSIS — M6281 Muscle weakness (generalized): Secondary | ICD-10-CM | POA: Diagnosis not present

## 2022-11-14 DIAGNOSIS — Z48811 Encounter for surgical aftercare following surgery on the nervous system: Secondary | ICD-10-CM | POA: Diagnosis not present

## 2022-11-14 DIAGNOSIS — R41841 Cognitive communication deficit: Secondary | ICD-10-CM | POA: Diagnosis not present

## 2022-11-18 DIAGNOSIS — R41841 Cognitive communication deficit: Secondary | ICD-10-CM | POA: Diagnosis not present

## 2022-11-18 DIAGNOSIS — Z48811 Encounter for surgical aftercare following surgery on the nervous system: Secondary | ICD-10-CM | POA: Diagnosis not present

## 2022-11-18 DIAGNOSIS — R1310 Dysphagia, unspecified: Secondary | ICD-10-CM | POA: Diagnosis not present

## 2022-11-18 DIAGNOSIS — M6281 Muscle weakness (generalized): Secondary | ICD-10-CM | POA: Diagnosis not present

## 2022-11-18 DIAGNOSIS — R2689 Other abnormalities of gait and mobility: Secondary | ICD-10-CM | POA: Diagnosis not present

## 2022-11-19 DIAGNOSIS — M6281 Muscle weakness (generalized): Secondary | ICD-10-CM | POA: Diagnosis not present

## 2022-11-19 DIAGNOSIS — R1310 Dysphagia, unspecified: Secondary | ICD-10-CM | POA: Diagnosis not present

## 2022-11-19 DIAGNOSIS — R41841 Cognitive communication deficit: Secondary | ICD-10-CM | POA: Diagnosis not present

## 2022-11-19 DIAGNOSIS — Z48811 Encounter for surgical aftercare following surgery on the nervous system: Secondary | ICD-10-CM | POA: Diagnosis not present

## 2022-11-19 DIAGNOSIS — R2689 Other abnormalities of gait and mobility: Secondary | ICD-10-CM | POA: Diagnosis not present

## 2022-11-20 DIAGNOSIS — Z48811 Encounter for surgical aftercare following surgery on the nervous system: Secondary | ICD-10-CM | POA: Diagnosis not present

## 2022-11-20 DIAGNOSIS — R41841 Cognitive communication deficit: Secondary | ICD-10-CM | POA: Diagnosis not present

## 2022-11-20 DIAGNOSIS — M6281 Muscle weakness (generalized): Secondary | ICD-10-CM | POA: Diagnosis not present

## 2022-11-20 DIAGNOSIS — R2689 Other abnormalities of gait and mobility: Secondary | ICD-10-CM | POA: Diagnosis not present

## 2022-11-20 DIAGNOSIS — R1310 Dysphagia, unspecified: Secondary | ICD-10-CM | POA: Diagnosis not present

## 2022-11-21 DIAGNOSIS — Z48811 Encounter for surgical aftercare following surgery on the nervous system: Secondary | ICD-10-CM | POA: Diagnosis not present

## 2022-11-21 DIAGNOSIS — R1310 Dysphagia, unspecified: Secondary | ICD-10-CM | POA: Diagnosis not present

## 2022-11-21 DIAGNOSIS — M6281 Muscle weakness (generalized): Secondary | ICD-10-CM | POA: Diagnosis not present

## 2022-11-21 DIAGNOSIS — R2689 Other abnormalities of gait and mobility: Secondary | ICD-10-CM | POA: Diagnosis not present

## 2022-11-21 DIAGNOSIS — R41841 Cognitive communication deficit: Secondary | ICD-10-CM | POA: Diagnosis not present

## 2022-11-24 DIAGNOSIS — R1310 Dysphagia, unspecified: Secondary | ICD-10-CM | POA: Diagnosis not present

## 2022-11-24 DIAGNOSIS — R2689 Other abnormalities of gait and mobility: Secondary | ICD-10-CM | POA: Diagnosis not present

## 2022-11-24 DIAGNOSIS — R41841 Cognitive communication deficit: Secondary | ICD-10-CM | POA: Diagnosis not present

## 2022-11-24 DIAGNOSIS — Z48811 Encounter for surgical aftercare following surgery on the nervous system: Secondary | ICD-10-CM | POA: Diagnosis not present

## 2022-11-24 DIAGNOSIS — M6281 Muscle weakness (generalized): Secondary | ICD-10-CM | POA: Diagnosis not present

## 2022-11-26 DIAGNOSIS — M6281 Muscle weakness (generalized): Secondary | ICD-10-CM | POA: Diagnosis not present

## 2022-11-26 DIAGNOSIS — R2689 Other abnormalities of gait and mobility: Secondary | ICD-10-CM | POA: Diagnosis not present

## 2022-11-26 DIAGNOSIS — R1310 Dysphagia, unspecified: Secondary | ICD-10-CM | POA: Diagnosis not present

## 2022-11-26 DIAGNOSIS — R41841 Cognitive communication deficit: Secondary | ICD-10-CM | POA: Diagnosis not present

## 2022-11-26 DIAGNOSIS — Z48811 Encounter for surgical aftercare following surgery on the nervous system: Secondary | ICD-10-CM | POA: Diagnosis not present

## 2022-11-27 DIAGNOSIS — R1310 Dysphagia, unspecified: Secondary | ICD-10-CM | POA: Diagnosis not present

## 2022-11-27 DIAGNOSIS — R41841 Cognitive communication deficit: Secondary | ICD-10-CM | POA: Diagnosis not present

## 2022-11-27 DIAGNOSIS — M6281 Muscle weakness (generalized): Secondary | ICD-10-CM | POA: Diagnosis not present

## 2022-11-27 DIAGNOSIS — Z48811 Encounter for surgical aftercare following surgery on the nervous system: Secondary | ICD-10-CM | POA: Diagnosis not present

## 2022-11-27 DIAGNOSIS — R2689 Other abnormalities of gait and mobility: Secondary | ICD-10-CM | POA: Diagnosis not present

## 2022-11-28 DIAGNOSIS — R2689 Other abnormalities of gait and mobility: Secondary | ICD-10-CM | POA: Diagnosis not present

## 2022-11-28 DIAGNOSIS — Z48811 Encounter for surgical aftercare following surgery on the nervous system: Secondary | ICD-10-CM | POA: Diagnosis not present

## 2022-11-28 DIAGNOSIS — M6281 Muscle weakness (generalized): Secondary | ICD-10-CM | POA: Diagnosis not present

## 2022-12-02 DIAGNOSIS — R2689 Other abnormalities of gait and mobility: Secondary | ICD-10-CM | POA: Diagnosis not present

## 2022-12-02 DIAGNOSIS — Z48811 Encounter for surgical aftercare following surgery on the nervous system: Secondary | ICD-10-CM | POA: Diagnosis not present

## 2022-12-02 DIAGNOSIS — M6281 Muscle weakness (generalized): Secondary | ICD-10-CM | POA: Diagnosis not present

## 2022-12-03 DIAGNOSIS — M6281 Muscle weakness (generalized): Secondary | ICD-10-CM | POA: Diagnosis not present

## 2022-12-03 DIAGNOSIS — Z48811 Encounter for surgical aftercare following surgery on the nervous system: Secondary | ICD-10-CM | POA: Diagnosis not present

## 2022-12-03 DIAGNOSIS — R2689 Other abnormalities of gait and mobility: Secondary | ICD-10-CM | POA: Diagnosis not present

## 2022-12-04 DIAGNOSIS — R2689 Other abnormalities of gait and mobility: Secondary | ICD-10-CM | POA: Diagnosis not present

## 2022-12-04 DIAGNOSIS — M6281 Muscle weakness (generalized): Secondary | ICD-10-CM | POA: Diagnosis not present

## 2022-12-04 DIAGNOSIS — Z48811 Encounter for surgical aftercare following surgery on the nervous system: Secondary | ICD-10-CM | POA: Diagnosis not present

## 2022-12-05 DIAGNOSIS — Z48811 Encounter for surgical aftercare following surgery on the nervous system: Secondary | ICD-10-CM | POA: Diagnosis not present

## 2022-12-05 DIAGNOSIS — R2689 Other abnormalities of gait and mobility: Secondary | ICD-10-CM | POA: Diagnosis not present

## 2022-12-05 DIAGNOSIS — M6281 Muscle weakness (generalized): Secondary | ICD-10-CM | POA: Diagnosis not present

## 2022-12-06 DIAGNOSIS — M4822 Kissing spine, cervical region: Secondary | ICD-10-CM | POA: Diagnosis not present

## 2022-12-06 DIAGNOSIS — I10 Essential (primary) hypertension: Secondary | ICD-10-CM | POA: Diagnosis not present

## 2022-12-06 DIAGNOSIS — E1122 Type 2 diabetes mellitus with diabetic chronic kidney disease: Secondary | ICD-10-CM | POA: Diagnosis not present

## 2022-12-06 DIAGNOSIS — J9611 Chronic respiratory failure with hypoxia: Secondary | ICD-10-CM | POA: Diagnosis not present

## 2022-12-08 DIAGNOSIS — Z48811 Encounter for surgical aftercare following surgery on the nervous system: Secondary | ICD-10-CM | POA: Diagnosis not present

## 2022-12-08 DIAGNOSIS — R2689 Other abnormalities of gait and mobility: Secondary | ICD-10-CM | POA: Diagnosis not present

## 2022-12-08 DIAGNOSIS — M6281 Muscle weakness (generalized): Secondary | ICD-10-CM | POA: Diagnosis not present

## 2022-12-10 DIAGNOSIS — M6281 Muscle weakness (generalized): Secondary | ICD-10-CM | POA: Diagnosis not present

## 2022-12-10 DIAGNOSIS — Z48811 Encounter for surgical aftercare following surgery on the nervous system: Secondary | ICD-10-CM | POA: Diagnosis not present

## 2022-12-10 DIAGNOSIS — R2689 Other abnormalities of gait and mobility: Secondary | ICD-10-CM | POA: Diagnosis not present

## 2022-12-11 DIAGNOSIS — Z48811 Encounter for surgical aftercare following surgery on the nervous system: Secondary | ICD-10-CM | POA: Diagnosis not present

## 2022-12-11 DIAGNOSIS — R2689 Other abnormalities of gait and mobility: Secondary | ICD-10-CM | POA: Diagnosis not present

## 2022-12-11 DIAGNOSIS — M6281 Muscle weakness (generalized): Secondary | ICD-10-CM | POA: Diagnosis not present

## 2022-12-15 DIAGNOSIS — Z48811 Encounter for surgical aftercare following surgery on the nervous system: Secondary | ICD-10-CM | POA: Diagnosis not present

## 2022-12-15 DIAGNOSIS — R2689 Other abnormalities of gait and mobility: Secondary | ICD-10-CM | POA: Diagnosis not present

## 2022-12-15 DIAGNOSIS — M6281 Muscle weakness (generalized): Secondary | ICD-10-CM | POA: Diagnosis not present

## 2022-12-16 DIAGNOSIS — Z48811 Encounter for surgical aftercare following surgery on the nervous system: Secondary | ICD-10-CM | POA: Diagnosis not present

## 2022-12-16 DIAGNOSIS — M6281 Muscle weakness (generalized): Secondary | ICD-10-CM | POA: Diagnosis not present

## 2022-12-16 DIAGNOSIS — R2689 Other abnormalities of gait and mobility: Secondary | ICD-10-CM | POA: Diagnosis not present

## 2022-12-17 DIAGNOSIS — M6281 Muscle weakness (generalized): Secondary | ICD-10-CM | POA: Diagnosis not present

## 2022-12-17 DIAGNOSIS — M4322 Fusion of spine, cervical region: Secondary | ICD-10-CM | POA: Diagnosis not present

## 2022-12-17 DIAGNOSIS — R2689 Other abnormalities of gait and mobility: Secondary | ICD-10-CM | POA: Diagnosis not present

## 2022-12-17 DIAGNOSIS — M4802 Spinal stenosis, cervical region: Secondary | ICD-10-CM | POA: Diagnosis not present

## 2022-12-17 DIAGNOSIS — Z48811 Encounter for surgical aftercare following surgery on the nervous system: Secondary | ICD-10-CM | POA: Diagnosis not present

## 2022-12-18 DIAGNOSIS — Z48811 Encounter for surgical aftercare following surgery on the nervous system: Secondary | ICD-10-CM | POA: Diagnosis not present

## 2022-12-18 DIAGNOSIS — R2689 Other abnormalities of gait and mobility: Secondary | ICD-10-CM | POA: Diagnosis not present

## 2022-12-18 DIAGNOSIS — M6281 Muscle weakness (generalized): Secondary | ICD-10-CM | POA: Diagnosis not present

## 2022-12-19 DIAGNOSIS — M6281 Muscle weakness (generalized): Secondary | ICD-10-CM | POA: Diagnosis not present

## 2022-12-19 DIAGNOSIS — Z48811 Encounter for surgical aftercare following surgery on the nervous system: Secondary | ICD-10-CM | POA: Diagnosis not present

## 2022-12-19 DIAGNOSIS — R2689 Other abnormalities of gait and mobility: Secondary | ICD-10-CM | POA: Diagnosis not present

## 2022-12-23 DIAGNOSIS — R2689 Other abnormalities of gait and mobility: Secondary | ICD-10-CM | POA: Diagnosis not present

## 2022-12-23 DIAGNOSIS — Z48811 Encounter for surgical aftercare following surgery on the nervous system: Secondary | ICD-10-CM | POA: Diagnosis not present

## 2022-12-23 DIAGNOSIS — M6281 Muscle weakness (generalized): Secondary | ICD-10-CM | POA: Diagnosis not present

## 2022-12-24 DIAGNOSIS — Z48811 Encounter for surgical aftercare following surgery on the nervous system: Secondary | ICD-10-CM | POA: Diagnosis not present

## 2022-12-24 DIAGNOSIS — R2689 Other abnormalities of gait and mobility: Secondary | ICD-10-CM | POA: Diagnosis not present

## 2022-12-24 DIAGNOSIS — M6281 Muscle weakness (generalized): Secondary | ICD-10-CM | POA: Diagnosis not present

## 2022-12-25 DIAGNOSIS — M6281 Muscle weakness (generalized): Secondary | ICD-10-CM | POA: Diagnosis not present

## 2022-12-25 DIAGNOSIS — R2689 Other abnormalities of gait and mobility: Secondary | ICD-10-CM | POA: Diagnosis not present

## 2022-12-25 DIAGNOSIS — Z48811 Encounter for surgical aftercare following surgery on the nervous system: Secondary | ICD-10-CM | POA: Diagnosis not present

## 2022-12-26 DIAGNOSIS — M6281 Muscle weakness (generalized): Secondary | ICD-10-CM | POA: Diagnosis not present

## 2022-12-26 DIAGNOSIS — Z48811 Encounter for surgical aftercare following surgery on the nervous system: Secondary | ICD-10-CM | POA: Diagnosis not present

## 2022-12-26 DIAGNOSIS — R2689 Other abnormalities of gait and mobility: Secondary | ICD-10-CM | POA: Diagnosis not present

## 2022-12-29 DIAGNOSIS — R2689 Other abnormalities of gait and mobility: Secondary | ICD-10-CM | POA: Diagnosis not present

## 2022-12-29 DIAGNOSIS — M6281 Muscle weakness (generalized): Secondary | ICD-10-CM | POA: Diagnosis not present

## 2022-12-29 DIAGNOSIS — Z48811 Encounter for surgical aftercare following surgery on the nervous system: Secondary | ICD-10-CM | POA: Diagnosis not present

## 2022-12-30 DIAGNOSIS — Z48811 Encounter for surgical aftercare following surgery on the nervous system: Secondary | ICD-10-CM | POA: Diagnosis not present

## 2022-12-30 DIAGNOSIS — M6281 Muscle weakness (generalized): Secondary | ICD-10-CM | POA: Diagnosis not present

## 2022-12-30 DIAGNOSIS — R2689 Other abnormalities of gait and mobility: Secondary | ICD-10-CM | POA: Diagnosis not present

## 2022-12-31 DIAGNOSIS — M6281 Muscle weakness (generalized): Secondary | ICD-10-CM | POA: Diagnosis not present

## 2022-12-31 DIAGNOSIS — R2689 Other abnormalities of gait and mobility: Secondary | ICD-10-CM | POA: Diagnosis not present

## 2022-12-31 DIAGNOSIS — Z48811 Encounter for surgical aftercare following surgery on the nervous system: Secondary | ICD-10-CM | POA: Diagnosis not present

## 2023-01-01 DIAGNOSIS — M6281 Muscle weakness (generalized): Secondary | ICD-10-CM | POA: Diagnosis not present

## 2023-01-01 DIAGNOSIS — R2689 Other abnormalities of gait and mobility: Secondary | ICD-10-CM | POA: Diagnosis not present

## 2023-01-01 DIAGNOSIS — Z48811 Encounter for surgical aftercare following surgery on the nervous system: Secondary | ICD-10-CM | POA: Diagnosis not present

## 2023-01-02 DIAGNOSIS — M6281 Muscle weakness (generalized): Secondary | ICD-10-CM | POA: Diagnosis not present

## 2023-01-02 DIAGNOSIS — R2689 Other abnormalities of gait and mobility: Secondary | ICD-10-CM | POA: Diagnosis not present

## 2023-01-02 DIAGNOSIS — Z48811 Encounter for surgical aftercare following surgery on the nervous system: Secondary | ICD-10-CM | POA: Diagnosis not present

## 2023-01-05 DIAGNOSIS — M6281 Muscle weakness (generalized): Secondary | ICD-10-CM | POA: Diagnosis not present

## 2023-01-05 DIAGNOSIS — Z48811 Encounter for surgical aftercare following surgery on the nervous system: Secondary | ICD-10-CM | POA: Diagnosis not present

## 2023-01-05 DIAGNOSIS — R2689 Other abnormalities of gait and mobility: Secondary | ICD-10-CM | POA: Diagnosis not present

## 2023-01-06 DIAGNOSIS — M6281 Muscle weakness (generalized): Secondary | ICD-10-CM | POA: Diagnosis not present

## 2023-01-06 DIAGNOSIS — Z48811 Encounter for surgical aftercare following surgery on the nervous system: Secondary | ICD-10-CM | POA: Diagnosis not present

## 2023-01-06 DIAGNOSIS — R2689 Other abnormalities of gait and mobility: Secondary | ICD-10-CM | POA: Diagnosis not present

## 2023-02-06 DIAGNOSIS — M4802 Spinal stenosis, cervical region: Secondary | ICD-10-CM | POA: Diagnosis not present

## 2023-02-06 DIAGNOSIS — S065X0D Traumatic subdural hemorrhage without loss of consciousness, subsequent encounter: Secondary | ICD-10-CM | POA: Diagnosis not present

## 2023-02-06 DIAGNOSIS — E1122 Type 2 diabetes mellitus with diabetic chronic kidney disease: Secondary | ICD-10-CM | POA: Diagnosis not present

## 2023-02-06 DIAGNOSIS — I10 Essential (primary) hypertension: Secondary | ICD-10-CM | POA: Diagnosis not present

## 2023-02-09 DIAGNOSIS — H04122 Dry eye syndrome of left lacrimal gland: Secondary | ICD-10-CM | POA: Diagnosis not present

## 2023-02-09 DIAGNOSIS — Z961 Presence of intraocular lens: Secondary | ICD-10-CM | POA: Diagnosis not present

## 2023-02-09 DIAGNOSIS — H524 Presbyopia: Secondary | ICD-10-CM | POA: Diagnosis not present

## 2023-02-09 DIAGNOSIS — E119 Type 2 diabetes mellitus without complications: Secondary | ICD-10-CM | POA: Diagnosis not present

## 2023-02-11 DIAGNOSIS — L603 Nail dystrophy: Secondary | ICD-10-CM | POA: Diagnosis not present

## 2023-02-11 DIAGNOSIS — I739 Peripheral vascular disease, unspecified: Secondary | ICD-10-CM | POA: Diagnosis not present

## 2023-03-05 DIAGNOSIS — F02C0 Dementia in other diseases classified elsewhere, severe, without behavioral disturbance, psychotic disturbance, mood disturbance, and anxiety: Secondary | ICD-10-CM | POA: Diagnosis not present

## 2023-03-05 DIAGNOSIS — F411 Generalized anxiety disorder: Secondary | ICD-10-CM | POA: Diagnosis not present

## 2023-03-05 DIAGNOSIS — G308 Other Alzheimer's disease: Secondary | ICD-10-CM | POA: Diagnosis not present

## 2023-03-05 DIAGNOSIS — F32A Depression, unspecified: Secondary | ICD-10-CM | POA: Diagnosis not present

## 2023-03-27 DIAGNOSIS — M62522 Muscle wasting and atrophy, not elsewhere classified, left upper arm: Secondary | ICD-10-CM | POA: Diagnosis not present

## 2023-03-27 DIAGNOSIS — M62521 Muscle wasting and atrophy, not elsewhere classified, right upper arm: Secondary | ICD-10-CM | POA: Diagnosis not present

## 2023-03-27 DIAGNOSIS — M62421 Contracture of muscle, right upper arm: Secondary | ICD-10-CM | POA: Diagnosis not present

## 2023-03-30 DIAGNOSIS — M62561 Muscle wasting and atrophy, not elsewhere classified, right lower leg: Secondary | ICD-10-CM | POA: Diagnosis not present

## 2023-03-30 DIAGNOSIS — R293 Abnormal posture: Secondary | ICD-10-CM | POA: Diagnosis not present

## 2023-03-30 DIAGNOSIS — S065X0D Traumatic subdural hemorrhage without loss of consciousness, subsequent encounter: Secondary | ICD-10-CM | POA: Diagnosis not present

## 2023-03-30 DIAGNOSIS — M62521 Muscle wasting and atrophy, not elsewhere classified, right upper arm: Secondary | ICD-10-CM | POA: Diagnosis not present

## 2023-03-30 DIAGNOSIS — M62421 Contracture of muscle, right upper arm: Secondary | ICD-10-CM | POA: Diagnosis not present

## 2023-03-30 DIAGNOSIS — M62522 Muscle wasting and atrophy, not elsewhere classified, left upper arm: Secondary | ICD-10-CM | POA: Diagnosis not present

## 2023-03-31 DIAGNOSIS — M62521 Muscle wasting and atrophy, not elsewhere classified, right upper arm: Secondary | ICD-10-CM | POA: Diagnosis not present

## 2023-03-31 DIAGNOSIS — R293 Abnormal posture: Secondary | ICD-10-CM | POA: Diagnosis not present

## 2023-03-31 DIAGNOSIS — M62561 Muscle wasting and atrophy, not elsewhere classified, right lower leg: Secondary | ICD-10-CM | POA: Diagnosis not present

## 2023-03-31 DIAGNOSIS — S065X0D Traumatic subdural hemorrhage without loss of consciousness, subsequent encounter: Secondary | ICD-10-CM | POA: Diagnosis not present

## 2023-03-31 DIAGNOSIS — M62522 Muscle wasting and atrophy, not elsewhere classified, left upper arm: Secondary | ICD-10-CM | POA: Diagnosis not present

## 2023-03-31 DIAGNOSIS — M62421 Contracture of muscle, right upper arm: Secondary | ICD-10-CM | POA: Diagnosis not present

## 2023-04-01 DIAGNOSIS — M62521 Muscle wasting and atrophy, not elsewhere classified, right upper arm: Secondary | ICD-10-CM | POA: Diagnosis not present

## 2023-04-01 DIAGNOSIS — S065X0D Traumatic subdural hemorrhage without loss of consciousness, subsequent encounter: Secondary | ICD-10-CM | POA: Diagnosis not present

## 2023-04-01 DIAGNOSIS — R293 Abnormal posture: Secondary | ICD-10-CM | POA: Diagnosis not present

## 2023-04-01 DIAGNOSIS — M62421 Contracture of muscle, right upper arm: Secondary | ICD-10-CM | POA: Diagnosis not present

## 2023-04-01 DIAGNOSIS — M4322 Fusion of spine, cervical region: Secondary | ICD-10-CM | POA: Diagnosis not present

## 2023-04-01 DIAGNOSIS — M62561 Muscle wasting and atrophy, not elsewhere classified, right lower leg: Secondary | ICD-10-CM | POA: Diagnosis not present

## 2023-04-01 DIAGNOSIS — M62522 Muscle wasting and atrophy, not elsewhere classified, left upper arm: Secondary | ICD-10-CM | POA: Diagnosis not present

## 2023-04-01 DIAGNOSIS — M4802 Spinal stenosis, cervical region: Secondary | ICD-10-CM | POA: Diagnosis not present

## 2023-04-03 DIAGNOSIS — S065XAD Traumatic subdural hemorrhage with loss of consciousness status unknown, subsequent encounter: Secondary | ICD-10-CM | POA: Diagnosis not present

## 2023-04-03 DIAGNOSIS — I10 Essential (primary) hypertension: Secondary | ICD-10-CM | POA: Diagnosis not present

## 2023-04-03 DIAGNOSIS — M4802 Spinal stenosis, cervical region: Secondary | ICD-10-CM | POA: Diagnosis not present

## 2023-04-03 DIAGNOSIS — E1122 Type 2 diabetes mellitus with diabetic chronic kidney disease: Secondary | ICD-10-CM | POA: Diagnosis not present

## 2023-04-06 DIAGNOSIS — S065X0D Traumatic subdural hemorrhage without loss of consciousness, subsequent encounter: Secondary | ICD-10-CM | POA: Diagnosis not present

## 2023-04-06 DIAGNOSIS — R293 Abnormal posture: Secondary | ICD-10-CM | POA: Diagnosis not present

## 2023-04-06 DIAGNOSIS — M62421 Contracture of muscle, right upper arm: Secondary | ICD-10-CM | POA: Diagnosis not present

## 2023-04-06 DIAGNOSIS — M62521 Muscle wasting and atrophy, not elsewhere classified, right upper arm: Secondary | ICD-10-CM | POA: Diagnosis not present

## 2023-04-06 DIAGNOSIS — M62561 Muscle wasting and atrophy, not elsewhere classified, right lower leg: Secondary | ICD-10-CM | POA: Diagnosis not present

## 2023-04-06 DIAGNOSIS — M62522 Muscle wasting and atrophy, not elsewhere classified, left upper arm: Secondary | ICD-10-CM | POA: Diagnosis not present

## 2023-04-07 DIAGNOSIS — M62421 Contracture of muscle, right upper arm: Secondary | ICD-10-CM | POA: Diagnosis not present

## 2023-04-07 DIAGNOSIS — M62561 Muscle wasting and atrophy, not elsewhere classified, right lower leg: Secondary | ICD-10-CM | POA: Diagnosis not present

## 2023-04-07 DIAGNOSIS — R293 Abnormal posture: Secondary | ICD-10-CM | POA: Diagnosis not present

## 2023-04-07 DIAGNOSIS — S065X0D Traumatic subdural hemorrhage without loss of consciousness, subsequent encounter: Secondary | ICD-10-CM | POA: Diagnosis not present

## 2023-04-07 DIAGNOSIS — M62522 Muscle wasting and atrophy, not elsewhere classified, left upper arm: Secondary | ICD-10-CM | POA: Diagnosis not present

## 2023-04-07 DIAGNOSIS — M62521 Muscle wasting and atrophy, not elsewhere classified, right upper arm: Secondary | ICD-10-CM | POA: Diagnosis not present

## 2023-04-08 DIAGNOSIS — M62522 Muscle wasting and atrophy, not elsewhere classified, left upper arm: Secondary | ICD-10-CM | POA: Diagnosis not present

## 2023-04-08 DIAGNOSIS — R293 Abnormal posture: Secondary | ICD-10-CM | POA: Diagnosis not present

## 2023-04-08 DIAGNOSIS — M62561 Muscle wasting and atrophy, not elsewhere classified, right lower leg: Secondary | ICD-10-CM | POA: Diagnosis not present

## 2023-04-08 DIAGNOSIS — M62521 Muscle wasting and atrophy, not elsewhere classified, right upper arm: Secondary | ICD-10-CM | POA: Diagnosis not present

## 2023-04-08 DIAGNOSIS — S065X0D Traumatic subdural hemorrhage without loss of consciousness, subsequent encounter: Secondary | ICD-10-CM | POA: Diagnosis not present

## 2023-04-08 DIAGNOSIS — M62421 Contracture of muscle, right upper arm: Secondary | ICD-10-CM | POA: Diagnosis not present

## 2023-04-09 DIAGNOSIS — M62521 Muscle wasting and atrophy, not elsewhere classified, right upper arm: Secondary | ICD-10-CM | POA: Diagnosis not present

## 2023-04-09 DIAGNOSIS — S065X0D Traumatic subdural hemorrhage without loss of consciousness, subsequent encounter: Secondary | ICD-10-CM | POA: Diagnosis not present

## 2023-04-09 DIAGNOSIS — M62561 Muscle wasting and atrophy, not elsewhere classified, right lower leg: Secondary | ICD-10-CM | POA: Diagnosis not present

## 2023-04-09 DIAGNOSIS — M62421 Contracture of muscle, right upper arm: Secondary | ICD-10-CM | POA: Diagnosis not present

## 2023-04-09 DIAGNOSIS — R293 Abnormal posture: Secondary | ICD-10-CM | POA: Diagnosis not present

## 2023-04-09 DIAGNOSIS — M62522 Muscle wasting and atrophy, not elsewhere classified, left upper arm: Secondary | ICD-10-CM | POA: Diagnosis not present

## 2023-04-10 DIAGNOSIS — M62521 Muscle wasting and atrophy, not elsewhere classified, right upper arm: Secondary | ICD-10-CM | POA: Diagnosis not present

## 2023-04-10 DIAGNOSIS — S065X0D Traumatic subdural hemorrhage without loss of consciousness, subsequent encounter: Secondary | ICD-10-CM | POA: Diagnosis not present

## 2023-04-10 DIAGNOSIS — M62522 Muscle wasting and atrophy, not elsewhere classified, left upper arm: Secondary | ICD-10-CM | POA: Diagnosis not present

## 2023-04-10 DIAGNOSIS — R293 Abnormal posture: Secondary | ICD-10-CM | POA: Diagnosis not present

## 2023-04-10 DIAGNOSIS — M62421 Contracture of muscle, right upper arm: Secondary | ICD-10-CM | POA: Diagnosis not present

## 2023-04-10 DIAGNOSIS — M62561 Muscle wasting and atrophy, not elsewhere classified, right lower leg: Secondary | ICD-10-CM | POA: Diagnosis not present

## 2023-04-13 DIAGNOSIS — M62521 Muscle wasting and atrophy, not elsewhere classified, right upper arm: Secondary | ICD-10-CM | POA: Diagnosis not present

## 2023-04-13 DIAGNOSIS — M62561 Muscle wasting and atrophy, not elsewhere classified, right lower leg: Secondary | ICD-10-CM | POA: Diagnosis not present

## 2023-04-13 DIAGNOSIS — S065X0D Traumatic subdural hemorrhage without loss of consciousness, subsequent encounter: Secondary | ICD-10-CM | POA: Diagnosis not present

## 2023-04-13 DIAGNOSIS — M62421 Contracture of muscle, right upper arm: Secondary | ICD-10-CM | POA: Diagnosis not present

## 2023-04-13 DIAGNOSIS — M62522 Muscle wasting and atrophy, not elsewhere classified, left upper arm: Secondary | ICD-10-CM | POA: Diagnosis not present

## 2023-04-13 DIAGNOSIS — R293 Abnormal posture: Secondary | ICD-10-CM | POA: Diagnosis not present

## 2023-04-14 DIAGNOSIS — S065X0D Traumatic subdural hemorrhage without loss of consciousness, subsequent encounter: Secondary | ICD-10-CM | POA: Diagnosis not present

## 2023-04-14 DIAGNOSIS — M62522 Muscle wasting and atrophy, not elsewhere classified, left upper arm: Secondary | ICD-10-CM | POA: Diagnosis not present

## 2023-04-14 DIAGNOSIS — M62521 Muscle wasting and atrophy, not elsewhere classified, right upper arm: Secondary | ICD-10-CM | POA: Diagnosis not present

## 2023-04-14 DIAGNOSIS — M62421 Contracture of muscle, right upper arm: Secondary | ICD-10-CM | POA: Diagnosis not present

## 2023-04-14 DIAGNOSIS — M62561 Muscle wasting and atrophy, not elsewhere classified, right lower leg: Secondary | ICD-10-CM | POA: Diagnosis not present

## 2023-04-14 DIAGNOSIS — R293 Abnormal posture: Secondary | ICD-10-CM | POA: Diagnosis not present

## 2023-04-15 DIAGNOSIS — M62522 Muscle wasting and atrophy, not elsewhere classified, left upper arm: Secondary | ICD-10-CM | POA: Diagnosis not present

## 2023-04-15 DIAGNOSIS — R293 Abnormal posture: Secondary | ICD-10-CM | POA: Diagnosis not present

## 2023-04-15 DIAGNOSIS — S065X0D Traumatic subdural hemorrhage without loss of consciousness, subsequent encounter: Secondary | ICD-10-CM | POA: Diagnosis not present

## 2023-04-15 DIAGNOSIS — M62521 Muscle wasting and atrophy, not elsewhere classified, right upper arm: Secondary | ICD-10-CM | POA: Diagnosis not present

## 2023-04-15 DIAGNOSIS — M62561 Muscle wasting and atrophy, not elsewhere classified, right lower leg: Secondary | ICD-10-CM | POA: Diagnosis not present

## 2023-04-15 DIAGNOSIS — M62421 Contracture of muscle, right upper arm: Secondary | ICD-10-CM | POA: Diagnosis not present

## 2023-04-16 DIAGNOSIS — M62421 Contracture of muscle, right upper arm: Secondary | ICD-10-CM | POA: Diagnosis not present

## 2023-04-16 DIAGNOSIS — M62521 Muscle wasting and atrophy, not elsewhere classified, right upper arm: Secondary | ICD-10-CM | POA: Diagnosis not present

## 2023-04-16 DIAGNOSIS — R293 Abnormal posture: Secondary | ICD-10-CM | POA: Diagnosis not present

## 2023-04-16 DIAGNOSIS — M62522 Muscle wasting and atrophy, not elsewhere classified, left upper arm: Secondary | ICD-10-CM | POA: Diagnosis not present

## 2023-04-16 DIAGNOSIS — M62561 Muscle wasting and atrophy, not elsewhere classified, right lower leg: Secondary | ICD-10-CM | POA: Diagnosis not present

## 2023-04-16 DIAGNOSIS — S065X0D Traumatic subdural hemorrhage without loss of consciousness, subsequent encounter: Secondary | ICD-10-CM | POA: Diagnosis not present

## 2023-04-17 DIAGNOSIS — S065X0D Traumatic subdural hemorrhage without loss of consciousness, subsequent encounter: Secondary | ICD-10-CM | POA: Diagnosis not present

## 2023-04-17 DIAGNOSIS — M62521 Muscle wasting and atrophy, not elsewhere classified, right upper arm: Secondary | ICD-10-CM | POA: Diagnosis not present

## 2023-04-17 DIAGNOSIS — M62522 Muscle wasting and atrophy, not elsewhere classified, left upper arm: Secondary | ICD-10-CM | POA: Diagnosis not present

## 2023-04-17 DIAGNOSIS — M62561 Muscle wasting and atrophy, not elsewhere classified, right lower leg: Secondary | ICD-10-CM | POA: Diagnosis not present

## 2023-04-17 DIAGNOSIS — R293 Abnormal posture: Secondary | ICD-10-CM | POA: Diagnosis not present

## 2023-04-17 DIAGNOSIS — M62421 Contracture of muscle, right upper arm: Secondary | ICD-10-CM | POA: Diagnosis not present

## 2023-04-20 DIAGNOSIS — M62421 Contracture of muscle, right upper arm: Secondary | ICD-10-CM | POA: Diagnosis not present

## 2023-04-20 DIAGNOSIS — R293 Abnormal posture: Secondary | ICD-10-CM | POA: Diagnosis not present

## 2023-04-20 DIAGNOSIS — M62521 Muscle wasting and atrophy, not elsewhere classified, right upper arm: Secondary | ICD-10-CM | POA: Diagnosis not present

## 2023-04-20 DIAGNOSIS — S065X0D Traumatic subdural hemorrhage without loss of consciousness, subsequent encounter: Secondary | ICD-10-CM | POA: Diagnosis not present

## 2023-04-20 DIAGNOSIS — M62561 Muscle wasting and atrophy, not elsewhere classified, right lower leg: Secondary | ICD-10-CM | POA: Diagnosis not present

## 2023-04-20 DIAGNOSIS — M62522 Muscle wasting and atrophy, not elsewhere classified, left upper arm: Secondary | ICD-10-CM | POA: Diagnosis not present

## 2023-04-21 DIAGNOSIS — M62522 Muscle wasting and atrophy, not elsewhere classified, left upper arm: Secondary | ICD-10-CM | POA: Diagnosis not present

## 2023-04-21 DIAGNOSIS — M62561 Muscle wasting and atrophy, not elsewhere classified, right lower leg: Secondary | ICD-10-CM | POA: Diagnosis not present

## 2023-04-21 DIAGNOSIS — M62521 Muscle wasting and atrophy, not elsewhere classified, right upper arm: Secondary | ICD-10-CM | POA: Diagnosis not present

## 2023-04-21 DIAGNOSIS — R293 Abnormal posture: Secondary | ICD-10-CM | POA: Diagnosis not present

## 2023-04-21 DIAGNOSIS — S065X0D Traumatic subdural hemorrhage without loss of consciousness, subsequent encounter: Secondary | ICD-10-CM | POA: Diagnosis not present

## 2023-04-21 DIAGNOSIS — M62421 Contracture of muscle, right upper arm: Secondary | ICD-10-CM | POA: Diagnosis not present

## 2023-04-22 DIAGNOSIS — M62521 Muscle wasting and atrophy, not elsewhere classified, right upper arm: Secondary | ICD-10-CM | POA: Diagnosis not present

## 2023-04-22 DIAGNOSIS — M62561 Muscle wasting and atrophy, not elsewhere classified, right lower leg: Secondary | ICD-10-CM | POA: Diagnosis not present

## 2023-04-22 DIAGNOSIS — R293 Abnormal posture: Secondary | ICD-10-CM | POA: Diagnosis not present

## 2023-04-22 DIAGNOSIS — S065X0D Traumatic subdural hemorrhage without loss of consciousness, subsequent encounter: Secondary | ICD-10-CM | POA: Diagnosis not present

## 2023-04-22 DIAGNOSIS — M62522 Muscle wasting and atrophy, not elsewhere classified, left upper arm: Secondary | ICD-10-CM | POA: Diagnosis not present

## 2023-04-22 DIAGNOSIS — M62421 Contracture of muscle, right upper arm: Secondary | ICD-10-CM | POA: Diagnosis not present

## 2023-04-23 DIAGNOSIS — M62521 Muscle wasting and atrophy, not elsewhere classified, right upper arm: Secondary | ICD-10-CM | POA: Diagnosis not present

## 2023-04-23 DIAGNOSIS — S065X0D Traumatic subdural hemorrhage without loss of consciousness, subsequent encounter: Secondary | ICD-10-CM | POA: Diagnosis not present

## 2023-04-23 DIAGNOSIS — M62561 Muscle wasting and atrophy, not elsewhere classified, right lower leg: Secondary | ICD-10-CM | POA: Diagnosis not present

## 2023-04-23 DIAGNOSIS — M62421 Contracture of muscle, right upper arm: Secondary | ICD-10-CM | POA: Diagnosis not present

## 2023-04-23 DIAGNOSIS — M62522 Muscle wasting and atrophy, not elsewhere classified, left upper arm: Secondary | ICD-10-CM | POA: Diagnosis not present

## 2023-04-23 DIAGNOSIS — R293 Abnormal posture: Secondary | ICD-10-CM | POA: Diagnosis not present

## 2023-04-24 DIAGNOSIS — M62522 Muscle wasting and atrophy, not elsewhere classified, left upper arm: Secondary | ICD-10-CM | POA: Diagnosis not present

## 2023-04-24 DIAGNOSIS — M62561 Muscle wasting and atrophy, not elsewhere classified, right lower leg: Secondary | ICD-10-CM | POA: Diagnosis not present

## 2023-04-24 DIAGNOSIS — S065X0D Traumatic subdural hemorrhage without loss of consciousness, subsequent encounter: Secondary | ICD-10-CM | POA: Diagnosis not present

## 2023-04-24 DIAGNOSIS — M62521 Muscle wasting and atrophy, not elsewhere classified, right upper arm: Secondary | ICD-10-CM | POA: Diagnosis not present

## 2023-04-24 DIAGNOSIS — M62421 Contracture of muscle, right upper arm: Secondary | ICD-10-CM | POA: Diagnosis not present

## 2023-04-24 DIAGNOSIS — R293 Abnormal posture: Secondary | ICD-10-CM | POA: Diagnosis not present

## 2023-04-27 DIAGNOSIS — M62561 Muscle wasting and atrophy, not elsewhere classified, right lower leg: Secondary | ICD-10-CM | POA: Diagnosis not present

## 2023-04-27 DIAGNOSIS — R293 Abnormal posture: Secondary | ICD-10-CM | POA: Diagnosis not present

## 2023-04-27 DIAGNOSIS — M62522 Muscle wasting and atrophy, not elsewhere classified, left upper arm: Secondary | ICD-10-CM | POA: Diagnosis not present

## 2023-04-27 DIAGNOSIS — S065X0D Traumatic subdural hemorrhage without loss of consciousness, subsequent encounter: Secondary | ICD-10-CM | POA: Diagnosis not present

## 2023-04-27 DIAGNOSIS — M62421 Contracture of muscle, right upper arm: Secondary | ICD-10-CM | POA: Diagnosis not present

## 2023-04-27 DIAGNOSIS — M62521 Muscle wasting and atrophy, not elsewhere classified, right upper arm: Secondary | ICD-10-CM | POA: Diagnosis not present

## 2023-04-28 DIAGNOSIS — M62421 Contracture of muscle, right upper arm: Secondary | ICD-10-CM | POA: Diagnosis not present

## 2023-04-28 DIAGNOSIS — M62521 Muscle wasting and atrophy, not elsewhere classified, right upper arm: Secondary | ICD-10-CM | POA: Diagnosis not present

## 2023-04-28 DIAGNOSIS — M62561 Muscle wasting and atrophy, not elsewhere classified, right lower leg: Secondary | ICD-10-CM | POA: Diagnosis not present

## 2023-04-28 DIAGNOSIS — S065X0D Traumatic subdural hemorrhage without loss of consciousness, subsequent encounter: Secondary | ICD-10-CM | POA: Diagnosis not present

## 2023-04-28 DIAGNOSIS — M62522 Muscle wasting and atrophy, not elsewhere classified, left upper arm: Secondary | ICD-10-CM | POA: Diagnosis not present

## 2023-04-28 DIAGNOSIS — R293 Abnormal posture: Secondary | ICD-10-CM | POA: Diagnosis not present

## 2023-04-29 DIAGNOSIS — S065X0D Traumatic subdural hemorrhage without loss of consciousness, subsequent encounter: Secondary | ICD-10-CM | POA: Diagnosis not present

## 2023-04-29 DIAGNOSIS — M62522 Muscle wasting and atrophy, not elsewhere classified, left upper arm: Secondary | ICD-10-CM | POA: Diagnosis not present

## 2023-04-29 DIAGNOSIS — R293 Abnormal posture: Secondary | ICD-10-CM | POA: Diagnosis not present

## 2023-04-29 DIAGNOSIS — M62521 Muscle wasting and atrophy, not elsewhere classified, right upper arm: Secondary | ICD-10-CM | POA: Diagnosis not present

## 2023-04-29 DIAGNOSIS — M62561 Muscle wasting and atrophy, not elsewhere classified, right lower leg: Secondary | ICD-10-CM | POA: Diagnosis not present

## 2023-04-29 DIAGNOSIS — M62421 Contracture of muscle, right upper arm: Secondary | ICD-10-CM | POA: Diagnosis not present

## 2023-04-30 DIAGNOSIS — R293 Abnormal posture: Secondary | ICD-10-CM | POA: Diagnosis not present

## 2023-04-30 DIAGNOSIS — M62561 Muscle wasting and atrophy, not elsewhere classified, right lower leg: Secondary | ICD-10-CM | POA: Diagnosis not present

## 2023-04-30 DIAGNOSIS — M62421 Contracture of muscle, right upper arm: Secondary | ICD-10-CM | POA: Diagnosis not present

## 2023-04-30 DIAGNOSIS — M62522 Muscle wasting and atrophy, not elsewhere classified, left upper arm: Secondary | ICD-10-CM | POA: Diagnosis not present

## 2023-04-30 DIAGNOSIS — M62521 Muscle wasting and atrophy, not elsewhere classified, right upper arm: Secondary | ICD-10-CM | POA: Diagnosis not present

## 2023-05-01 DIAGNOSIS — R293 Abnormal posture: Secondary | ICD-10-CM | POA: Diagnosis not present

## 2023-05-01 DIAGNOSIS — M62521 Muscle wasting and atrophy, not elsewhere classified, right upper arm: Secondary | ICD-10-CM | POA: Diagnosis not present

## 2023-05-01 DIAGNOSIS — M62561 Muscle wasting and atrophy, not elsewhere classified, right lower leg: Secondary | ICD-10-CM | POA: Diagnosis not present

## 2023-05-01 DIAGNOSIS — M62421 Contracture of muscle, right upper arm: Secondary | ICD-10-CM | POA: Diagnosis not present

## 2023-05-01 DIAGNOSIS — M62522 Muscle wasting and atrophy, not elsewhere classified, left upper arm: Secondary | ICD-10-CM | POA: Diagnosis not present

## 2023-05-04 DIAGNOSIS — R293 Abnormal posture: Secondary | ICD-10-CM | POA: Diagnosis not present

## 2023-05-04 DIAGNOSIS — M62561 Muscle wasting and atrophy, not elsewhere classified, right lower leg: Secondary | ICD-10-CM | POA: Diagnosis not present

## 2023-05-04 DIAGNOSIS — M62521 Muscle wasting and atrophy, not elsewhere classified, right upper arm: Secondary | ICD-10-CM | POA: Diagnosis not present

## 2023-05-04 DIAGNOSIS — M62522 Muscle wasting and atrophy, not elsewhere classified, left upper arm: Secondary | ICD-10-CM | POA: Diagnosis not present

## 2023-05-04 DIAGNOSIS — M62421 Contracture of muscle, right upper arm: Secondary | ICD-10-CM | POA: Diagnosis not present

## 2023-05-05 DIAGNOSIS — M62561 Muscle wasting and atrophy, not elsewhere classified, right lower leg: Secondary | ICD-10-CM | POA: Diagnosis not present

## 2023-05-05 DIAGNOSIS — R293 Abnormal posture: Secondary | ICD-10-CM | POA: Diagnosis not present

## 2023-05-05 DIAGNOSIS — M62522 Muscle wasting and atrophy, not elsewhere classified, left upper arm: Secondary | ICD-10-CM | POA: Diagnosis not present

## 2023-05-05 DIAGNOSIS — M62421 Contracture of muscle, right upper arm: Secondary | ICD-10-CM | POA: Diagnosis not present

## 2023-05-05 DIAGNOSIS — M62521 Muscle wasting and atrophy, not elsewhere classified, right upper arm: Secondary | ICD-10-CM | POA: Diagnosis not present

## 2023-05-06 DIAGNOSIS — M62521 Muscle wasting and atrophy, not elsewhere classified, right upper arm: Secondary | ICD-10-CM | POA: Diagnosis not present

## 2023-05-06 DIAGNOSIS — M62561 Muscle wasting and atrophy, not elsewhere classified, right lower leg: Secondary | ICD-10-CM | POA: Diagnosis not present

## 2023-05-06 DIAGNOSIS — R293 Abnormal posture: Secondary | ICD-10-CM | POA: Diagnosis not present

## 2023-05-06 DIAGNOSIS — M62522 Muscle wasting and atrophy, not elsewhere classified, left upper arm: Secondary | ICD-10-CM | POA: Diagnosis not present

## 2023-05-06 DIAGNOSIS — M62421 Contracture of muscle, right upper arm: Secondary | ICD-10-CM | POA: Diagnosis not present

## 2023-05-07 DIAGNOSIS — M62521 Muscle wasting and atrophy, not elsewhere classified, right upper arm: Secondary | ICD-10-CM | POA: Diagnosis not present

## 2023-05-07 DIAGNOSIS — R293 Abnormal posture: Secondary | ICD-10-CM | POA: Diagnosis not present

## 2023-05-07 DIAGNOSIS — M62561 Muscle wasting and atrophy, not elsewhere classified, right lower leg: Secondary | ICD-10-CM | POA: Diagnosis not present

## 2023-05-07 DIAGNOSIS — M62421 Contracture of muscle, right upper arm: Secondary | ICD-10-CM | POA: Diagnosis not present

## 2023-05-07 DIAGNOSIS — M62522 Muscle wasting and atrophy, not elsewhere classified, left upper arm: Secondary | ICD-10-CM | POA: Diagnosis not present

## 2023-05-08 DIAGNOSIS — R293 Abnormal posture: Secondary | ICD-10-CM | POA: Diagnosis not present

## 2023-05-08 DIAGNOSIS — M62421 Contracture of muscle, right upper arm: Secondary | ICD-10-CM | POA: Diagnosis not present

## 2023-05-08 DIAGNOSIS — M62522 Muscle wasting and atrophy, not elsewhere classified, left upper arm: Secondary | ICD-10-CM | POA: Diagnosis not present

## 2023-05-08 DIAGNOSIS — M62561 Muscle wasting and atrophy, not elsewhere classified, right lower leg: Secondary | ICD-10-CM | POA: Diagnosis not present

## 2023-05-08 DIAGNOSIS — M62521 Muscle wasting and atrophy, not elsewhere classified, right upper arm: Secondary | ICD-10-CM | POA: Diagnosis not present

## 2023-05-13 DIAGNOSIS — H6123 Impacted cerumen, bilateral: Secondary | ICD-10-CM | POA: Diagnosis not present

## 2023-05-13 DIAGNOSIS — H903 Sensorineural hearing loss, bilateral: Secondary | ICD-10-CM | POA: Diagnosis not present

## 2023-05-27 DIAGNOSIS — F32A Depression, unspecified: Secondary | ICD-10-CM | POA: Diagnosis not present

## 2023-05-27 DIAGNOSIS — F02C0 Dementia in other diseases classified elsewhere, severe, without behavioral disturbance, psychotic disturbance, mood disturbance, and anxiety: Secondary | ICD-10-CM | POA: Diagnosis not present

## 2023-05-27 DIAGNOSIS — G308 Other Alzheimer's disease: Secondary | ICD-10-CM | POA: Diagnosis not present

## 2023-05-27 DIAGNOSIS — F411 Generalized anxiety disorder: Secondary | ICD-10-CM | POA: Diagnosis not present

## 2023-06-05 DIAGNOSIS — M4802 Spinal stenosis, cervical region: Secondary | ICD-10-CM | POA: Diagnosis not present

## 2023-06-05 DIAGNOSIS — I10 Essential (primary) hypertension: Secondary | ICD-10-CM | POA: Diagnosis not present

## 2023-06-05 DIAGNOSIS — E1122 Type 2 diabetes mellitus with diabetic chronic kidney disease: Secondary | ICD-10-CM | POA: Diagnosis not present

## 2023-06-29 DIAGNOSIS — L602 Onychogryphosis: Secondary | ICD-10-CM | POA: Diagnosis not present

## 2023-06-29 DIAGNOSIS — L603 Nail dystrophy: Secondary | ICD-10-CM | POA: Diagnosis not present

## 2023-06-29 DIAGNOSIS — E1151 Type 2 diabetes mellitus with diabetic peripheral angiopathy without gangrene: Secondary | ICD-10-CM | POA: Diagnosis not present

## 2023-06-29 DIAGNOSIS — Z794 Long term (current) use of insulin: Secondary | ICD-10-CM | POA: Diagnosis not present

## 2023-07-08 DIAGNOSIS — F02C0 Dementia in other diseases classified elsewhere, severe, without behavioral disturbance, psychotic disturbance, mood disturbance, and anxiety: Secondary | ICD-10-CM | POA: Diagnosis not present

## 2023-07-08 DIAGNOSIS — F411 Generalized anxiety disorder: Secondary | ICD-10-CM | POA: Diagnosis not present

## 2023-07-08 DIAGNOSIS — G308 Other Alzheimer's disease: Secondary | ICD-10-CM | POA: Diagnosis not present

## 2023-07-08 DIAGNOSIS — F32A Depression, unspecified: Secondary | ICD-10-CM | POA: Diagnosis not present

## 2023-07-31 DIAGNOSIS — M4802 Spinal stenosis, cervical region: Secondary | ICD-10-CM | POA: Diagnosis not present

## 2023-07-31 DIAGNOSIS — I10 Essential (primary) hypertension: Secondary | ICD-10-CM | POA: Diagnosis not present

## 2023-07-31 DIAGNOSIS — E1122 Type 2 diabetes mellitus with diabetic chronic kidney disease: Secondary | ICD-10-CM | POA: Diagnosis not present

## 2023-08-05 DIAGNOSIS — F02C0 Dementia in other diseases classified elsewhere, severe, without behavioral disturbance, psychotic disturbance, mood disturbance, and anxiety: Secondary | ICD-10-CM | POA: Diagnosis not present

## 2023-08-05 DIAGNOSIS — F32A Depression, unspecified: Secondary | ICD-10-CM | POA: Diagnosis not present

## 2023-08-05 DIAGNOSIS — F411 Generalized anxiety disorder: Secondary | ICD-10-CM | POA: Diagnosis not present

## 2023-08-05 DIAGNOSIS — G308 Other Alzheimer's disease: Secondary | ICD-10-CM | POA: Diagnosis not present

## 2023-09-02 DIAGNOSIS — G308 Other Alzheimer's disease: Secondary | ICD-10-CM | POA: Diagnosis not present

## 2023-09-02 DIAGNOSIS — F411 Generalized anxiety disorder: Secondary | ICD-10-CM | POA: Diagnosis not present

## 2023-09-02 DIAGNOSIS — F02C0 Dementia in other diseases classified elsewhere, severe, without behavioral disturbance, psychotic disturbance, mood disturbance, and anxiety: Secondary | ICD-10-CM | POA: Diagnosis not present

## 2023-09-02 DIAGNOSIS — F32A Depression, unspecified: Secondary | ICD-10-CM | POA: Diagnosis not present

## 2023-09-08 DIAGNOSIS — L602 Onychogryphosis: Secondary | ICD-10-CM | POA: Diagnosis not present

## 2023-09-08 DIAGNOSIS — E1151 Type 2 diabetes mellitus with diabetic peripheral angiopathy without gangrene: Secondary | ICD-10-CM | POA: Diagnosis not present

## 2023-09-08 DIAGNOSIS — Z794 Long term (current) use of insulin: Secondary | ICD-10-CM | POA: Diagnosis not present

## 2023-09-08 DIAGNOSIS — L603 Nail dystrophy: Secondary | ICD-10-CM | POA: Diagnosis not present

## 2023-09-17 DIAGNOSIS — Z794 Long term (current) use of insulin: Secondary | ICD-10-CM | POA: Diagnosis not present

## 2023-09-17 DIAGNOSIS — E611 Iron deficiency: Secondary | ICD-10-CM | POA: Diagnosis not present

## 2023-09-17 DIAGNOSIS — E1140 Type 2 diabetes mellitus with diabetic neuropathy, unspecified: Secondary | ICD-10-CM | POA: Diagnosis not present

## 2023-09-17 DIAGNOSIS — E039 Hypothyroidism, unspecified: Secondary | ICD-10-CM | POA: Diagnosis not present

## 2023-09-20 DIAGNOSIS — M4802 Spinal stenosis, cervical region: Secondary | ICD-10-CM | POA: Diagnosis not present

## 2023-09-20 DIAGNOSIS — I10 Essential (primary) hypertension: Secondary | ICD-10-CM | POA: Diagnosis not present

## 2023-09-20 DIAGNOSIS — E1122 Type 2 diabetes mellitus with diabetic chronic kidney disease: Secondary | ICD-10-CM | POA: Diagnosis not present

## 2023-10-02 DIAGNOSIS — M47816 Spondylosis without myelopathy or radiculopathy, lumbar region: Secondary | ICD-10-CM | POA: Diagnosis not present

## 2023-10-02 DIAGNOSIS — Z7901 Long term (current) use of anticoagulants: Secondary | ICD-10-CM | POA: Diagnosis not present

## 2023-10-02 DIAGNOSIS — Z7983 Long term (current) use of bisphosphonates: Secondary | ICD-10-CM | POA: Diagnosis not present

## 2023-10-02 DIAGNOSIS — G9340 Encephalopathy, unspecified: Secondary | ICD-10-CM | POA: Diagnosis not present

## 2023-10-02 DIAGNOSIS — K219 Gastro-esophageal reflux disease without esophagitis: Secondary | ICD-10-CM | POA: Diagnosis not present

## 2023-10-02 DIAGNOSIS — R102 Pelvic and perineal pain: Secondary | ICD-10-CM | POA: Diagnosis not present

## 2023-10-02 DIAGNOSIS — R569 Unspecified convulsions: Secondary | ICD-10-CM | POA: Diagnosis not present

## 2023-10-02 DIAGNOSIS — I10 Essential (primary) hypertension: Secondary | ICD-10-CM | POA: Diagnosis not present

## 2023-10-02 DIAGNOSIS — N390 Urinary tract infection, site not specified: Secondary | ICD-10-CM | POA: Diagnosis not present

## 2023-10-02 DIAGNOSIS — R4020 Unspecified coma: Secondary | ICD-10-CM | POA: Diagnosis not present

## 2023-10-02 DIAGNOSIS — R4182 Altered mental status, unspecified: Secondary | ICD-10-CM | POA: Diagnosis not present

## 2023-10-02 DIAGNOSIS — Z981 Arthrodesis status: Secondary | ICD-10-CM | POA: Diagnosis not present

## 2023-10-02 DIAGNOSIS — R0989 Other specified symptoms and signs involving the circulatory and respiratory systems: Secondary | ICD-10-CM | POA: Diagnosis not present

## 2023-10-02 DIAGNOSIS — E785 Hyperlipidemia, unspecified: Secondary | ICD-10-CM | POA: Diagnosis not present

## 2023-10-02 DIAGNOSIS — M129 Arthropathy, unspecified: Secondary | ICD-10-CM | POA: Diagnosis not present

## 2023-10-02 DIAGNOSIS — R918 Other nonspecific abnormal finding of lung field: Secondary | ICD-10-CM | POA: Diagnosis not present

## 2023-10-02 DIAGNOSIS — E119 Type 2 diabetes mellitus without complications: Secondary | ICD-10-CM | POA: Diagnosis not present

## 2023-10-02 DIAGNOSIS — Z792 Long term (current) use of antibiotics: Secondary | ICD-10-CM | POA: Diagnosis not present

## 2023-10-03 DIAGNOSIS — Z7401 Bed confinement status: Secondary | ICD-10-CM | POA: Diagnosis not present

## 2023-10-03 DIAGNOSIS — I10 Essential (primary) hypertension: Secondary | ICD-10-CM | POA: Diagnosis not present

## 2023-10-03 DIAGNOSIS — E1122 Type 2 diabetes mellitus with diabetic chronic kidney disease: Secondary | ICD-10-CM | POA: Diagnosis not present

## 2023-10-04 DIAGNOSIS — M4802 Spinal stenosis, cervical region: Secondary | ICD-10-CM | POA: Diagnosis not present

## 2023-10-04 DIAGNOSIS — I10 Essential (primary) hypertension: Secondary | ICD-10-CM | POA: Diagnosis not present

## 2023-10-04 DIAGNOSIS — E1122 Type 2 diabetes mellitus with diabetic chronic kidney disease: Secondary | ICD-10-CM | POA: Diagnosis not present

## 2023-10-31 DEATH — deceased

## 2024-05-12 ENCOUNTER — Ambulatory Visit (INDEPENDENT_AMBULATORY_CARE_PROVIDER_SITE_OTHER): Payer: Medicare PPO | Admitting: Otolaryngology
